# Patient Record
Sex: Female | Born: 1950 | Race: White | Hispanic: No | Marital: Married | Smoking: Never smoker
Health system: Southern US, Community
[De-identification: ages and names within clinical notes are randomized; demographics above are authoritative.]

## PROBLEM LIST (undated history)

## (undated) DIAGNOSIS — J3081 Allergic rhinitis due to animal (cat) (dog) hair and dander: Secondary | ICD-10-CM

## (undated) DIAGNOSIS — IMO0002 Reserved for concepts with insufficient information to code with codable children: Secondary | ICD-10-CM

## (undated) DIAGNOSIS — K219 Gastro-esophageal reflux disease without esophagitis: Secondary | ICD-10-CM

## (undated) DIAGNOSIS — Z9109 Other allergy status, other than to drugs and biological substances: Secondary | ICD-10-CM

## (undated) DIAGNOSIS — T7840XA Allergy, unspecified, initial encounter: Secondary | ICD-10-CM

## (undated) DIAGNOSIS — F329 Major depressive disorder, single episode, unspecified: Secondary | ICD-10-CM

## (undated) DIAGNOSIS — M1990 Unspecified osteoarthritis, unspecified site: Secondary | ICD-10-CM

## (undated) DIAGNOSIS — M359 Systemic involvement of connective tissue, unspecified: Secondary | ICD-10-CM

## (undated) DIAGNOSIS — M797 Fibromyalgia: Secondary | ICD-10-CM

## (undated) DIAGNOSIS — J45909 Unspecified asthma, uncomplicated: Secondary | ICD-10-CM

## (undated) DIAGNOSIS — N814 Uterovaginal prolapse, unspecified: Secondary | ICD-10-CM

## (undated) DIAGNOSIS — E039 Hypothyroidism, unspecified: Secondary | ICD-10-CM

## (undated) DIAGNOSIS — F419 Anxiety disorder, unspecified: Secondary | ICD-10-CM

## (undated) DIAGNOSIS — M810 Age-related osteoporosis without current pathological fracture: Secondary | ICD-10-CM

## (undated) DIAGNOSIS — F32A Depression, unspecified: Secondary | ICD-10-CM

## (undated) HISTORY — DX: Gastro-esophageal reflux disease without esophagitis: K21.9

## (undated) HISTORY — PX: TUBAL LIGATION: SHX77

## (undated) HISTORY — PX: BREAST EXCISIONAL BIOPSY: SUR124

## (undated) HISTORY — PX: CHOLECYSTECTOMY: SHX55

## (undated) HISTORY — PX: SPINE SURGERY: SHX786

## (undated) HISTORY — DX: Uterovaginal prolapse, unspecified: N81.4

## (undated) HISTORY — DX: Systemic involvement of connective tissue, unspecified: M35.9

## (undated) HISTORY — DX: Age-related osteoporosis without current pathological fracture: M81.0

## (undated) HISTORY — PX: BACK SURGERY: SHX140

## (undated) HISTORY — DX: Anxiety disorder, unspecified: F41.9

## (undated) HISTORY — PX: CERVICAL FUSION: SHX112

## (undated) HISTORY — DX: Allergy, unspecified, initial encounter: T78.40XA

## (undated) HISTORY — DX: Depression, unspecified: F32.A

## (undated) HISTORY — DX: Unspecified osteoarthritis, unspecified site: M19.90

## (undated) HISTORY — PX: INJECTION ANESTHETIC AGENT TO CERVICAL PLEXUS: SUR704

## (undated) HISTORY — DX: Fibromyalgia: M79.7

## (undated) HISTORY — PX: ECTOPIC PREGNANCY SURGERY: SHX613

## (undated) HISTORY — DX: Major depressive disorder, single episode, unspecified: F32.9

## (undated) HISTORY — PX: COSMETIC SURGERY: SHX468

## (undated) HISTORY — PX: APPENDECTOMY: SHX54

## (undated) HISTORY — PX: SKIN GRAFT: SHX250

## (undated) HISTORY — DX: Reserved for concepts with insufficient information to code with codable children: IMO0002

## (undated) HISTORY — PX: OTHER SURGICAL HISTORY: SHX169

---

## 2004-03-15 HISTORY — PX: CERVICAL SPINE SURGERY: SHX589

## 2009-12-30 ENCOUNTER — Ambulatory Visit (HOSPITAL_COMMUNITY): Payer: Self-pay | Admitting: Licensed Clinical Social Worker

## 2010-01-13 ENCOUNTER — Ambulatory Visit (HOSPITAL_COMMUNITY): Payer: Self-pay | Admitting: Licensed Clinical Social Worker

## 2010-01-23 ENCOUNTER — Ambulatory Visit (HOSPITAL_COMMUNITY): Payer: Self-pay | Admitting: Licensed Clinical Social Worker

## 2010-02-24 ENCOUNTER — Ambulatory Visit (HOSPITAL_COMMUNITY): Payer: Self-pay | Admitting: Licensed Clinical Social Worker

## 2010-03-10 ENCOUNTER — Ambulatory Visit (HOSPITAL_COMMUNITY): Payer: Self-pay | Admitting: Licensed Clinical Social Worker

## 2010-03-24 ENCOUNTER — Ambulatory Visit (HOSPITAL_COMMUNITY)
Admission: RE | Admit: 2010-03-24 | Discharge: 2010-03-24 | Payer: Self-pay | Source: Home / Self Care | Attending: Licensed Clinical Social Worker | Admitting: Licensed Clinical Social Worker

## 2010-03-31 ENCOUNTER — Ambulatory Visit (HOSPITAL_COMMUNITY)
Admission: RE | Admit: 2010-03-31 | Discharge: 2010-03-31 | Payer: Self-pay | Source: Home / Self Care | Attending: Licensed Clinical Social Worker | Admitting: Licensed Clinical Social Worker

## 2010-04-07 ENCOUNTER — Ambulatory Visit (HOSPITAL_COMMUNITY): Admit: 2010-04-07 | Payer: Self-pay | Admitting: Licensed Clinical Social Worker

## 2010-04-21 ENCOUNTER — Encounter (INDEPENDENT_AMBULATORY_CARE_PROVIDER_SITE_OTHER): Payer: PRIVATE HEALTH INSURANCE | Admitting: Licensed Clinical Social Worker

## 2010-04-21 DIAGNOSIS — F341 Dysthymic disorder: Secondary | ICD-10-CM

## 2010-04-24 ENCOUNTER — Encounter (HOSPITAL_COMMUNITY): Payer: Self-pay | Admitting: Psychiatry

## 2010-04-27 ENCOUNTER — Ambulatory Visit (INDEPENDENT_AMBULATORY_CARE_PROVIDER_SITE_OTHER): Payer: PRIVATE HEALTH INSURANCE | Admitting: Psychiatry

## 2010-04-27 DIAGNOSIS — F339 Major depressive disorder, recurrent, unspecified: Secondary | ICD-10-CM

## 2010-05-05 ENCOUNTER — Encounter (HOSPITAL_COMMUNITY): Payer: Self-pay | Admitting: Licensed Clinical Social Worker

## 2010-05-07 ENCOUNTER — Encounter (INDEPENDENT_AMBULATORY_CARE_PROVIDER_SITE_OTHER): Payer: PRIVATE HEALTH INSURANCE | Admitting: Licensed Clinical Social Worker

## 2010-05-07 DIAGNOSIS — F411 Generalized anxiety disorder: Secondary | ICD-10-CM

## 2010-05-07 DIAGNOSIS — F331 Major depressive disorder, recurrent, moderate: Secondary | ICD-10-CM

## 2010-05-19 ENCOUNTER — Encounter (INDEPENDENT_AMBULATORY_CARE_PROVIDER_SITE_OTHER): Payer: PRIVATE HEALTH INSURANCE | Admitting: Licensed Clinical Social Worker

## 2010-05-19 DIAGNOSIS — F341 Dysthymic disorder: Secondary | ICD-10-CM

## 2010-06-02 ENCOUNTER — Encounter (INDEPENDENT_AMBULATORY_CARE_PROVIDER_SITE_OTHER): Payer: PRIVATE HEALTH INSURANCE | Admitting: Licensed Clinical Social Worker

## 2010-06-02 DIAGNOSIS — F411 Generalized anxiety disorder: Secondary | ICD-10-CM

## 2010-06-04 ENCOUNTER — Encounter: Payer: Self-pay | Admitting: Family Medicine

## 2010-06-04 ENCOUNTER — Ambulatory Visit (INDEPENDENT_AMBULATORY_CARE_PROVIDER_SITE_OTHER): Payer: PRIVATE HEALTH INSURANCE | Admitting: Family Medicine

## 2010-06-04 DIAGNOSIS — M5030 Other cervical disc degeneration, unspecified cervical region: Secondary | ICD-10-CM

## 2010-06-04 DIAGNOSIS — M25569 Pain in unspecified knee: Secondary | ICD-10-CM

## 2010-06-04 DIAGNOSIS — M797 Fibromyalgia: Secondary | ICD-10-CM | POA: Insufficient documentation

## 2010-06-04 DIAGNOSIS — IMO0001 Reserved for inherently not codable concepts without codable children: Secondary | ICD-10-CM

## 2010-06-04 DIAGNOSIS — F32A Depression, unspecified: Secondary | ICD-10-CM | POA: Insufficient documentation

## 2010-06-04 DIAGNOSIS — F329 Major depressive disorder, single episode, unspecified: Secondary | ICD-10-CM

## 2010-06-04 DIAGNOSIS — E559 Vitamin D deficiency, unspecified: Secondary | ICD-10-CM | POA: Insufficient documentation

## 2010-06-04 DIAGNOSIS — E039 Hypothyroidism, unspecified: Secondary | ICD-10-CM | POA: Insufficient documentation

## 2010-06-04 DIAGNOSIS — M47812 Spondylosis without myelopathy or radiculopathy, cervical region: Secondary | ICD-10-CM | POA: Insufficient documentation

## 2010-06-04 MED ORDER — TRAMADOL HCL 50 MG PO TABS: 50.0000 mg | ORAL_TABLET | Freq: Four times a day (QID) | ORAL | Status: DC | PRN

## 2010-06-04 MED ORDER — GABAPENTIN 400 MG PO TABS: 400.0000 mg | ORAL_TABLET | Freq: Four times a day (QID) | ORAL | Status: DC
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
## 2010-06-04 NOTE — Patient Instructions (Addendum)
We will call and see if we can get you in with Dr. Titus Dubin (rheumatology) in [place].   Go in May for your thyroid recheck.

## 2010-06-04 NOTE — Assessment & Plan Note (Signed)
Discussed that she is not happy with her current regimen. She would like ot go back to 4 tabs of neurontin so increased her dose. I did refill her tramadol as well.  Will refer to rheumatology for further evaluation. She already has a scheduled appt with Dr. Ancil Linsey in June. Will try to get her in somewhere sooner.  She has done PT  And this has not really helped.

## 2010-06-04 NOTE — Assessment & Plan Note (Signed)
She is due to recheck her level in 2 months.  Given lab slip for downstairs. She is feeling better on this.  This is a fairly new dx.

## 2010-06-05 ENCOUNTER — Telehealth: Payer: Self-pay | Admitting: Family Medicine

## 2010-06-08 DIAGNOSIS — M25569 Pain in unspecified knee: Secondary | ICD-10-CM | POA: Insufficient documentation

## 2010-06-09 ENCOUNTER — Encounter: Payer: Self-pay | Admitting: Family Medicine

## 2010-06-10 ENCOUNTER — Ambulatory Visit (HOSPITAL_COMMUNITY): Payer: Self-pay | Admitting: Psychiatry

## 2010-06-11 ENCOUNTER — Encounter (INDEPENDENT_AMBULATORY_CARE_PROVIDER_SITE_OTHER): Payer: PRIVATE HEALTH INSURANCE | Admitting: Psychiatry

## 2010-06-11 DIAGNOSIS — F339 Major depressive disorder, recurrent, unspecified: Secondary | ICD-10-CM

## 2010-06-15 ENCOUNTER — Encounter: Payer: Self-pay | Admitting: Family Medicine

## 2010-06-16 ENCOUNTER — Encounter (INDEPENDENT_AMBULATORY_CARE_PROVIDER_SITE_OTHER): Payer: PRIVATE HEALTH INSURANCE | Admitting: Licensed Clinical Social Worker

## 2010-06-16 DIAGNOSIS — F411 Generalized anxiety disorder: Secondary | ICD-10-CM

## 2010-06-22 ENCOUNTER — Telehealth: Payer: Self-pay | Admitting: Family Medicine

## 2010-06-22 NOTE — Telephone Encounter (Signed)
Patient called 2 x today and the first time she complains that she called LAST Thursday 4/5 and left a message for the nurse re: her cough and she has not heard anything back from anyone. She states that she left another message today and is kind of agitated that she can not get a phone call back.... Her call back number is [phone].Marland KitchenMarland KitchenThanks, Michaelle Copas

## 2010-06-23 ENCOUNTER — Ambulatory Visit (INDEPENDENT_AMBULATORY_CARE_PROVIDER_SITE_OTHER): Payer: PRIVATE HEALTH INSURANCE | Admitting: Family Medicine

## 2010-06-23 DIAGNOSIS — J40 Bronchitis, not specified as acute or chronic: Secondary | ICD-10-CM

## 2010-06-23 MED ORDER — AZITHROMYCIN 250 MG PO TABS: ORAL_TABLET | ORAL | Status: AC

## 2010-06-23 MED ORDER — FLUTICASONE FUROATE 27.5 MCG/SPRAY NA SUSP: 2.0000 | Freq: Every day | NASAL | Status: DC

## 2010-06-23 NOTE — Progress Notes (Signed)
  Subjective:    Patient ID: Robin Conrad, female    DOB: 1950-12-11, 60 y.o.   MRN: [id number]  HPI  Cough started about 10 days. Started with a loose cough. Got worse over the week.  Sputum has been yellow. Then nose was getting dry and started seeing some blood.  Did take her allergies shot last week. Voice is hoarse.  Cough has been worse in the AM.  Then used her inhaler again this AM.  SEver HA.  Right ear has pressure.  Has hx of small ear canals.  No fever.  No wheezing. Allergist felt this was not her asthma.   Review of Systems     Objective:   Physical Exam  Constitutional: She is oriented to person, place, and time. She appears well-developed and well-nourished.  HENT:  Head: Normocephalic and atraumatic.  Right Ear: External ear normal.  Nose: Nose normal.  Mouth/Throat: Oropharynx is clear and moist.       Left canal blocked by cerumen. Nasal turbinates are swollen.   Eyes: Conjunctivae and EOM are normal. Pupils are equal, round, and reactive to light.  Neck: No thyromegaly present.  Cardiovascular: Normal rate, regular rhythm and normal heart sounds.   Pulmonary/Chest: Effort normal and breath sounds normal.  Musculoskeletal: Normal range of motion.  Lymphadenopathy:    She has no cervical adenopathy.  Neurological: She is alert and oriented to person, place, and time.  Skin: Skin is warm and dry.          Assessment & Plan:  Acute bronchitis - symptomatic care. Discussed trial of ABX since not better. Encouraged her to wait a couple of more day to fill it. She is also having some nasal allergy sxs so will start a nasal steroid spray until her f/u with the allergies in May. She feels her allergies are worse since moving back to [place].  She has been using her inhaled steroid daily and has not sign of SOB or wheezing on exam today.  Call if not better in 7-10 days.

## 2010-06-23 NOTE — Telephone Encounter (Signed)
Pt was called yesterday around 5 pm and worked into todays schdule

## 2010-06-25 ENCOUNTER — Ambulatory Visit: Payer: PRIVATE HEALTH INSURANCE | Admitting: Family Medicine

## 2010-06-30 ENCOUNTER — Encounter (HOSPITAL_COMMUNITY): Payer: PRIVATE HEALTH INSURANCE | Admitting: Licensed Clinical Social Worker

## 2010-07-02 ENCOUNTER — Encounter (INDEPENDENT_AMBULATORY_CARE_PROVIDER_SITE_OTHER): Payer: PRIVATE HEALTH INSURANCE | Admitting: Licensed Clinical Social Worker

## 2010-07-02 DIAGNOSIS — F411 Generalized anxiety disorder: Secondary | ICD-10-CM

## 2010-07-07 ENCOUNTER — Ambulatory Visit (INDEPENDENT_AMBULATORY_CARE_PROVIDER_SITE_OTHER): Payer: PRIVATE HEALTH INSURANCE | Admitting: Family Medicine

## 2010-07-07 VITALS — BP 122/64 | HR 103 | Ht 66.5 in | Wt 123.0 lb

## 2010-07-07 DIAGNOSIS — M792 Neuralgia and neuritis, unspecified: Secondary | ICD-10-CM | POA: Insufficient documentation

## 2010-07-07 DIAGNOSIS — IMO0002 Reserved for concepts with insufficient information to code with codable children: Secondary | ICD-10-CM

## 2010-07-07 DIAGNOSIS — Z1231 Encounter for screening mammogram for malignant neoplasm of breast: Secondary | ICD-10-CM

## 2010-07-07 DIAGNOSIS — E039 Hypothyroidism, unspecified: Secondary | ICD-10-CM

## 2010-07-07 MED ORDER — LACTULOSE 10 GM/15ML PO SOLN: 10.0000 g | Freq: Every day | ORAL | Status: AC

## 2010-07-07 NOTE — Progress Notes (Signed)
  Subjective:    Patient ID: Robin Conrad, female    DOB: 1950-05-30, 60 y.o.   MRN: [id number]  HPI Dr. Christell Constant upped her citalopram to 20mg  and she feels much better on this.  Also using her benzo once a day. She feels this regimen has been working well for her. She is still seeing a counselor downstairs.  Neuropathic pain  - Feels the neurotin 4 x a dya works better. It is a little constipating.  Colace doesn't really work. Has been using lactulose.   she would like refill on this medications.   She hasn't gone for her labs for thyroid and vitamin.     Review of Systems     Objective:   Physical Exam  Constitutional: She appears well-developed and well-nourished.  HENT:  Head: Normocephalic and atraumatic.  Eyes: Conjunctivae are normal. Pupils are equal, round, and reactive to light.  Neck: Neck supple. No thyromegaly present.  Cardiovascular: Normal rate, regular rhythm and normal heart sounds.   Pulmonary/Chest: Effort normal and breath sounds normal.  Lymphadenopathy:    She has no cervical adenopathy.          Assessment & Plan:  Doing well with he rneuropathic pain.    She is also due for her mammogram so will schedule it for her.

## 2010-07-07 NOTE — Assessment & Plan Note (Signed)
Recheck TSH this month.

## 2010-07-07 NOTE — Assessment & Plan Note (Signed)
She is doing well Neurontin.

## 2010-07-13 ENCOUNTER — Encounter: Payer: Self-pay | Admitting: Family Medicine

## 2010-07-14 ENCOUNTER — Ambulatory Visit
Admission: RE | Admit: 2010-07-14 | Discharge: 2010-07-14 | Disposition: A | Discharge status: Home / Self Care | Payer: PRIVATE HEALTH INSURANCE | Source: Ambulatory Visit | Attending: Family Medicine | Admitting: Family Medicine

## 2010-07-17 ENCOUNTER — Other Ambulatory Visit: Payer: Self-pay | Admitting: Family Medicine

## 2010-07-17 DIAGNOSIS — R928 Other abnormal and inconclusive findings on diagnostic imaging of breast: Secondary | ICD-10-CM

## 2010-07-21 ENCOUNTER — Encounter (INDEPENDENT_AMBULATORY_CARE_PROVIDER_SITE_OTHER): Payer: PRIVATE HEALTH INSURANCE | Admitting: Licensed Clinical Social Worker

## 2010-07-21 DIAGNOSIS — F411 Generalized anxiety disorder: Secondary | ICD-10-CM

## 2010-07-22 ENCOUNTER — Ambulatory Visit
Admission: RE | Admit: 2010-07-22 | Discharge: 2010-07-22 | Disposition: A | Discharge status: Home / Self Care | Payer: PRIVATE HEALTH INSURANCE | Source: Ambulatory Visit | Attending: Family Medicine | Admitting: Family Medicine

## 2010-07-22 ENCOUNTER — Telehealth: Payer: Self-pay | Admitting: Family Medicine

## 2010-07-22 DIAGNOSIS — R928 Other abnormal and inconclusive findings on diagnostic imaging of breast: Secondary | ICD-10-CM

## 2010-07-22 NOTE — Telephone Encounter (Signed)
Left message on pt.'s vm.

## 2010-07-22 NOTE — Telephone Encounter (Signed)
Call pt: Mammogram was OK. Repeat in one year.

## 2010-07-23 ENCOUNTER — Other Ambulatory Visit: Payer: PRIVATE HEALTH INSURANCE | Admitting: Family Medicine

## 2010-07-23 ENCOUNTER — Telehealth: Payer: Self-pay | Admitting: *Deleted

## 2010-07-23 DIAGNOSIS — E039 Hypothyroidism, unspecified: Secondary | ICD-10-CM

## 2010-07-23 NOTE — Telephone Encounter (Signed)
ordered

## 2010-07-24 LAB — TSH: TSH: 0.763 u[IU]/mL (ref 0.350–4.500)

## 2010-08-04 ENCOUNTER — Encounter (INDEPENDENT_AMBULATORY_CARE_PROVIDER_SITE_OTHER): Payer: PRIVATE HEALTH INSURANCE | Admitting: Licensed Clinical Social Worker

## 2010-08-04 DIAGNOSIS — F411 Generalized anxiety disorder: Secondary | ICD-10-CM

## 2010-08-05 ENCOUNTER — Encounter: Payer: Self-pay | Admitting: Family Medicine

## 2010-08-06 ENCOUNTER — Telehealth: Payer: Self-pay | Admitting: Family Medicine

## 2010-08-06 NOTE — Telephone Encounter (Signed)
Message copied by Seymour Bars on Thu Aug 06, 2010  8:20 AM ------      Message from: Payton Spark      Created: Wed Aug 05, 2010 11:46 AM                   ----- Message -----         From: Lab In [place] Interface         Sent: 07/24/2010   2:28 AM           To: Payton Spark, CMA

## 2010-08-07 ENCOUNTER — Other Ambulatory Visit: Payer: Self-pay | Admitting: Family Medicine

## 2010-08-07 NOTE — Telephone Encounter (Signed)
Message copied by Glennis Brink on Fri Aug 07, 2010  2:30 PM ------      Message from: MILLS, MICHELLE P      Created: Wed Aug 05, 2010 11:46 AM                   ----- Message -----         From: Lab In [place] Interface         Sent: 07/24/2010   2:28 AM           To: Payton Spark, CMA

## 2010-08-07 NOTE — Telephone Encounter (Signed)
Called patient: Thyroid and vitamin D looked great.

## 2010-08-07 NOTE — Telephone Encounter (Signed)
Pt.notified

## 2010-08-11 ENCOUNTER — Encounter (INDEPENDENT_AMBULATORY_CARE_PROVIDER_SITE_OTHER): Payer: PRIVATE HEALTH INSURANCE | Admitting: Psychiatry

## 2010-08-11 DIAGNOSIS — F339 Major depressive disorder, recurrent, unspecified: Secondary | ICD-10-CM

## 2010-08-11 DIAGNOSIS — F411 Generalized anxiety disorder: Secondary | ICD-10-CM

## 2010-08-18 ENCOUNTER — Encounter (INDEPENDENT_AMBULATORY_CARE_PROVIDER_SITE_OTHER): Payer: PRIVATE HEALTH INSURANCE | Admitting: Licensed Clinical Social Worker

## 2010-08-18 DIAGNOSIS — F411 Generalized anxiety disorder: Secondary | ICD-10-CM

## 2010-08-20 ENCOUNTER — Other Ambulatory Visit: Payer: Self-pay | Admitting: Family Medicine

## 2010-08-24 ENCOUNTER — Telehealth: Payer: Self-pay | Admitting: Family Medicine

## 2010-08-24 NOTE — Telephone Encounter (Signed)
Pt called and needs to know if she needs anything from our office for her referral appt with Dr. Ancil Linsey tomorrow.  Seeing him for his fibromyalgia.  Said Dr. Linford Arnold didn't feel comfortable treating or following and she preferred her to go ahead and see the specialist for this problem. Told pt to let their office go on  the noted that her previous PCP Dr. Dierdre Searles had sent.  Pt was scheduled a 3 mth followup as well for out office while she was on the phone. Jarvis Newcomer, LPN [facility]

## 2010-09-01 ENCOUNTER — Encounter (INDEPENDENT_AMBULATORY_CARE_PROVIDER_SITE_OTHER): Payer: PRIVATE HEALTH INSURANCE | Admitting: Licensed Clinical Social Worker

## 2010-09-01 DIAGNOSIS — F411 Generalized anxiety disorder: Secondary | ICD-10-CM

## 2010-09-03 MED ORDER — TRAMADOL HCL 50 MG PO TABS: 50.0000 mg | ORAL_TABLET | Freq: Every day | ORAL | Status: DC | PRN

## 2010-09-16 ENCOUNTER — Other Ambulatory Visit: Payer: Self-pay | Admitting: Family Medicine

## 2010-09-17 ENCOUNTER — Encounter (INDEPENDENT_AMBULATORY_CARE_PROVIDER_SITE_OTHER): Payer: PRIVATE HEALTH INSURANCE | Admitting: Licensed Clinical Social Worker

## 2010-09-17 DIAGNOSIS — F411 Generalized anxiety disorder: Secondary | ICD-10-CM

## 2010-10-01 ENCOUNTER — Encounter (INDEPENDENT_AMBULATORY_CARE_PROVIDER_SITE_OTHER): Payer: PRIVATE HEALTH INSURANCE | Admitting: Licensed Clinical Social Worker

## 2010-10-01 DIAGNOSIS — F410 Panic disorder [episodic paroxysmal anxiety] without agoraphobia: Secondary | ICD-10-CM

## 2010-10-06 ENCOUNTER — Ambulatory Visit: Payer: PRIVATE HEALTH INSURANCE | Admitting: Family Medicine

## 2010-10-08 ENCOUNTER — Encounter: Payer: Self-pay | Admitting: Family Medicine

## 2010-10-08 ENCOUNTER — Ambulatory Visit (INDEPENDENT_AMBULATORY_CARE_PROVIDER_SITE_OTHER): Payer: PRIVATE HEALTH INSURANCE | Admitting: Family Medicine

## 2010-10-08 VITALS — BP 110/63 | HR 88 | Ht 65.0 in | Wt 126.0 lb

## 2010-10-08 DIAGNOSIS — IMO0001 Reserved for inherently not codable concepts without codable children: Secondary | ICD-10-CM

## 2010-10-08 DIAGNOSIS — E559 Vitamin D deficiency, unspecified: Secondary | ICD-10-CM

## 2010-10-08 DIAGNOSIS — F411 Generalized anxiety disorder: Secondary | ICD-10-CM

## 2010-10-08 DIAGNOSIS — E039 Hypothyroidism, unspecified: Secondary | ICD-10-CM

## 2010-10-08 DIAGNOSIS — F419 Anxiety disorder, unspecified: Secondary | ICD-10-CM

## 2010-10-08 DIAGNOSIS — M797 Fibromyalgia: Secondary | ICD-10-CM

## 2010-10-08 NOTE — Progress Notes (Signed)
  Subjective:    Patient ID: Robin Conrad, female    DOB: 1950/11/30, 60 y.o.   MRN: [id number]  HPI Says the citalopram  Has caused some mild weight gain and constipation. She takes lactulose and even that doesn't help.  Now using a vegetable laxitive.  Says has mentioned this to Dr. Christell Constant who is the prescriber (psych) before.  She walks regularly.  O/w she does feel it works well for her mood and anxiety.  She is using her xanax regularly  Vitamin D def- Last level was normal. Wants to know what to take fore maintenance.   Fibro - Happy with her curent regimen. Getting some exercise.    Review of Systems     Objective:   Physical Exam  Constitutional: She is oriented to person, place, and time. She appears well-developed and well-nourished.  HENT:  Head: Normocephalic and atraumatic.  Cardiovascular: Normal rate, regular rhythm and normal heart sounds.   Pulmonary/Chest: Effort normal and breath sounds normal.  Neurological: She is alert and oriented to person, place, and time.  Skin: Skin is warm and dry.  Psychiatric: She has a normal mood and affect.          Assessment & Plan:  Anxiety - Discussed really needs to let Dr Christell Constant know she rally wants to change her med. Even consider changing to Lexapro to see if fewer s.e.   Vitamin D def - Now resolved. Start 1000iu daily. She can also take this as part of her calcium supplement as well.   Fibromyalgis- Is stable.  No changes today.

## 2010-10-08 NOTE — Assessment & Plan Note (Signed)
Due to recheck levels 

## 2010-10-08 NOTE — Patient Instructions (Addendum)
Vitamin D1000 IU once a day.  Consider changing to Lexapro.

## 2010-10-14 ENCOUNTER — Other Ambulatory Visit: Payer: Self-pay | Admitting: Family Medicine

## 2010-10-15 ENCOUNTER — Encounter (HOSPITAL_COMMUNITY): Payer: PRIVATE HEALTH INSURANCE | Admitting: Licensed Clinical Social Worker

## 2010-10-16 ENCOUNTER — Encounter (INDEPENDENT_AMBULATORY_CARE_PROVIDER_SITE_OTHER): Payer: PRIVATE HEALTH INSURANCE | Admitting: Licensed Clinical Social Worker

## 2010-10-16 DIAGNOSIS — F411 Generalized anxiety disorder: Secondary | ICD-10-CM

## 2010-11-03 ENCOUNTER — Encounter (INDEPENDENT_AMBULATORY_CARE_PROVIDER_SITE_OTHER): Payer: PRIVATE HEALTH INSURANCE | Admitting: Licensed Clinical Social Worker

## 2010-11-03 DIAGNOSIS — F411 Generalized anxiety disorder: Secondary | ICD-10-CM

## 2010-11-03 DIAGNOSIS — F331 Major depressive disorder, recurrent, moderate: Secondary | ICD-10-CM

## 2010-11-12 ENCOUNTER — Encounter (INDEPENDENT_AMBULATORY_CARE_PROVIDER_SITE_OTHER): Payer: PRIVATE HEALTH INSURANCE | Admitting: Psychiatry

## 2010-11-12 DIAGNOSIS — F339 Major depressive disorder, recurrent, unspecified: Secondary | ICD-10-CM

## 2010-11-12 DIAGNOSIS — F411 Generalized anxiety disorder: Secondary | ICD-10-CM

## 2010-11-13 ENCOUNTER — Telehealth: Payer: Self-pay | Admitting: Family Medicine

## 2010-11-13 ENCOUNTER — Other Ambulatory Visit: Payer: Self-pay | Admitting: *Deleted

## 2010-11-13 ENCOUNTER — Telehealth: Payer: Self-pay | Admitting: *Deleted

## 2010-11-13 MED ORDER — TRAMADOL HCL 50 MG PO TABS: 50.0000 mg | ORAL_TABLET | Freq: Every day | ORAL | Status: DC | PRN

## 2010-11-13 MED ORDER — LEVOTHYROXINE SODIUM 50 MCG PO TABS: 50.0000 ug | ORAL_TABLET | Freq: Every day | ORAL | Status: DC

## 2010-11-13 NOTE — Telephone Encounter (Signed)
When I look at previous fills we only fill it for 30 tabs a month so it really doesn't matter if the directions say one or twice a day.  If she is asking for an increse in her dosing then that is different.

## 2010-11-13 NOTE — Telephone Encounter (Signed)
Pt.notified

## 2010-11-13 NOTE — Telephone Encounter (Signed)
Call ZO:[id number] looks great!

## 2010-11-13 NOTE — Telephone Encounter (Signed)
Pt calls and states that the tramadol she takes for fibromyalgia when got filled last time the pharmacist noticed the directions were changed from 1 tab twice a day to 1 tab daily. Pt not sure why and says she can not go all day with just taking 1 tab. Wants to know if you can send a new rx with directions of 2 a day to new pharmacy- [facility] in [facility]

## 2010-11-17 ENCOUNTER — Encounter (INDEPENDENT_AMBULATORY_CARE_PROVIDER_SITE_OTHER): Payer: PRIVATE HEALTH INSURANCE | Admitting: Licensed Clinical Social Worker

## 2010-11-17 DIAGNOSIS — F410 Panic disorder [episodic paroxysmal anxiety] without agoraphobia: Secondary | ICD-10-CM

## 2010-11-19 ENCOUNTER — Telehealth: Payer: Self-pay | Admitting: Family Medicine

## 2010-11-19 NOTE — Telephone Encounter (Signed)
Pt seen back in July 2012, and a script sent for tramadol for 50 mg PO daily, but pt says her script has always been 50mg  QID PRN. Pt called [place] because we have been prescribing medication.  Please advise if we can fix this script.  Pt has been on QID regimen for years.  Pt uses [facility]. Plan:  Routed to Dr. Linford Arnold for review.

## 2010-11-19 NOTE — Telephone Encounter (Signed)
We have filled for 30 tabs a month since March so I am really not sure what her concern is.  Id really doesn't matter if says once a day for 4 times a day she still only get 30 a month. If she feels she needs an increase in her pain regimen then she needs to make an appt.

## 2010-11-23 NOTE — Telephone Encounter (Signed)
Pt notified that will only fill for # 30 for 30 day supply.  Pt has appt on 11-25-10, and will discuss increase of qty at that appt. Jarvis Newcomer, LPN [facility]

## 2010-11-25 ENCOUNTER — Encounter: Payer: Self-pay | Admitting: Family Medicine

## 2010-11-25 ENCOUNTER — Ambulatory Visit (INDEPENDENT_AMBULATORY_CARE_PROVIDER_SITE_OTHER): Payer: PRIVATE HEALTH INSURANCE | Admitting: Family Medicine

## 2010-11-25 VITALS — BP 125/71 | HR 94 | Wt 127.0 lb

## 2010-11-25 DIAGNOSIS — IMO0001 Reserved for inherently not codable concepts without codable children: Secondary | ICD-10-CM

## 2010-11-25 DIAGNOSIS — R1310 Dysphagia, unspecified: Secondary | ICD-10-CM

## 2010-11-25 DIAGNOSIS — Z23 Encounter for immunization: Secondary | ICD-10-CM

## 2010-11-25 DIAGNOSIS — K5909 Other constipation: Secondary | ICD-10-CM | POA: Insufficient documentation

## 2010-11-25 DIAGNOSIS — K5900 Constipation, unspecified: Secondary | ICD-10-CM

## 2010-11-25 DIAGNOSIS — M797 Fibromyalgia: Secondary | ICD-10-CM

## 2010-11-25 MED ORDER — TRAMADOL HCL 50 MG PO TABS: 50.0000 mg | ORAL_TABLET | Freq: Three times a day (TID) | ORAL | Status: DC | PRN

## 2010-11-25 NOTE — Patient Instructions (Signed)
Can take Tylenol arthritis 2-3 times a day.

## 2010-11-25 NOTE — Progress Notes (Signed)
Subjective:    Patient ID: Robin Conrad, female    DOB: [birth year], 60 y.o.   MRN: [id number]  HPI Fibro - She says sometimes will take more than one tramadol in one days. Has seen rheumatologist who recommend no change to her meds excpet ot add tylenol to her tramadol.  She hasn't tried this because she has been fearful about what she hears on the news.  Says her pain is wrose in the AM.  Has been having back and hip pain.  Moved her tramadol at bedtime. She is gettig ready to travel to [place] so wouldl ike ot have extra medications.    She has also noticed a soreness on the left side of her throat when she swallows for the last couple days. She denies eating any popcorn products or actual difficulty in swallowing. No recent URI symptoms or fever. She wonders if it could be the plate in her neck bothering her. She admits she has had more reflux symptoms lately because her constipation has actually pulled it worse. She's been taking an over-the-counter stimulant in addition to Colace once at night and it does seem to help. She typically only has one bowel movement per week. She feels bloated and distended. She has tried MiraLax in the past and says it makes her more bloated. She's also tried probiotics and says that this worked initially but then quit working.  Review of Systems     Objective:   Physical Exam  Constitutional: She is oriented to person, place, and time. She appears well-developed and well-nourished.  HENT:  Head: Normocephalic and atraumatic.  Mouth/Throat: Oropharynx is clear and moist.  Eyes: Conjunctivae are normal. Pupils are equal, round, and reactive to light.  Neck: Neck supple. No thyromegaly present.  Cardiovascular: Normal rate, regular rhythm and normal heart sounds.   Pulmonary/Chest: Effort normal and breath sounds normal.  Lymphadenopathy:    She has no cervical adenopathy.  Neurological: She is alert and oriented to person, place, and time.  Skin: Skin is  warm and dry.  Psychiatric: She has a normal mood and affect. Her behavior is normal.          Assessment & Plan:  Fibromyalgia-old increase her tramadol to twice a day. She can certainly use it as needed. #60 tabs given with 2 refills. I also reviewed with her that she can use Tylenol safely. The most important thing is to recognize that it is and other over-the-counter combination products and to avoid mixing anything that says acetaminophen or Tylenol if she is already taking Tylenol. It does in fact improve pain to combine it with her tramadol.  Back pain-she is still having difficulty with this. She feels she benefits from meds from massage therapy. I asked her to check with her insurance as we could certainly look into this as part of physical therapy regimen.  Painful swallowing-asked patient to just keep an eye on it for the next week or 2 and see if it resolves on its own. Her exam is normal her neck exam is normal. This is reassuring and she's not had any for URI symptoms. If she notes any difficulty actually passing foods or drink and she selected and I can refer her to gastroenterology for further evaluation. Certainly this could be related to her increasing reflux symptoms recently. I do agree that this can get worse if she is more constipated. I discussed avoiding stimulants and tried taking Colace at one with each meal, 3 times a day,  for the next couple weeks to see if this helps with her regularity. If not we could certainly consider, aMitiza, since she has not tolerated MiraLax in the past.  Constipation-see above.

## 2010-11-27 ENCOUNTER — Other Ambulatory Visit: Payer: Self-pay | Admitting: Family Medicine

## 2010-11-27 MED ORDER — CYCLOBENZAPRINE HCL 10 MG PO TABS: 10.0000 mg | ORAL_TABLET | Freq: Three times a day (TID) | ORAL | Status: DC | PRN

## 2010-11-27 NOTE — Telephone Encounter (Signed)
Pt calling for refill of her flexeril.  Says a refill was suppose to have been sent yesterday, but it does not look like it was sent, so sent today for #90/0 refills. Jarvis Newcomer, LPN [facility]

## 2010-12-11 ENCOUNTER — Other Ambulatory Visit: Payer: Self-pay | Admitting: Family Medicine

## 2010-12-11 NOTE — Telephone Encounter (Signed)
Pt requesting refill alvesco inhaler. Plan:  Reviewed the pt chart and refills are still good on alvesco medication  Pharm notified.  6.1 g inhaler is only good for 15 days and they want to dispense 2 inhalers at a time with the 7 refills remaining.  Authorized. Jarvis Newcomer, LPN [facility]

## 2010-12-17 ENCOUNTER — Encounter (HOSPITAL_COMMUNITY): Payer: PRIVATE HEALTH INSURANCE | Admitting: Licensed Clinical Social Worker

## 2010-12-23 ENCOUNTER — Encounter (INDEPENDENT_AMBULATORY_CARE_PROVIDER_SITE_OTHER): Payer: PRIVATE HEALTH INSURANCE | Admitting: Licensed Clinical Social Worker

## 2010-12-23 DIAGNOSIS — F411 Generalized anxiety disorder: Secondary | ICD-10-CM

## 2011-01-06 ENCOUNTER — Encounter (INDEPENDENT_AMBULATORY_CARE_PROVIDER_SITE_OTHER): Payer: PRIVATE HEALTH INSURANCE | Admitting: Licensed Clinical Social Worker

## 2011-01-06 DIAGNOSIS — F411 Generalized anxiety disorder: Secondary | ICD-10-CM

## 2011-01-08 ENCOUNTER — Telehealth: Payer: Self-pay | Admitting: Family Medicine

## 2011-01-08 NOTE — Telephone Encounter (Signed)
Pt called and said she had been sick all week but for the last 3 days had laryngitis, and a bad cough.  Needs instructions on what to do. Plan:  Pt is asthmatic, and is using inhalers, but pt is hurting in her chest and red and hot in face last night which could indicate fever and these symptoms have been going on for a week and pt instucted to go to UC today to be seen.  Laryngitis protocol given. Jarvis Newcomer, LPN [facility]

## 2011-01-11 ENCOUNTER — Inpatient Hospital Stay (INDEPENDENT_AMBULATORY_CARE_PROVIDER_SITE_OTHER)
Admission: RE | Admit: 2011-01-11 | Discharge: 2011-01-11 | Disposition: A | Discharge status: Home / Self Care | Payer: PRIVATE HEALTH INSURANCE | Source: Ambulatory Visit | Attending: Emergency Medicine | Admitting: Emergency Medicine

## 2011-01-11 ENCOUNTER — Encounter: Payer: Self-pay | Admitting: Emergency Medicine

## 2011-01-11 ENCOUNTER — Telehealth: Payer: Self-pay | Admitting: Family Medicine

## 2011-01-11 DIAGNOSIS — J209 Acute bronchitis, unspecified: Secondary | ICD-10-CM

## 2011-01-11 DIAGNOSIS — E039 Hypothyroidism, unspecified: Secondary | ICD-10-CM | POA: Insufficient documentation

## 2011-01-11 DIAGNOSIS — J45909 Unspecified asthma, uncomplicated: Secondary | ICD-10-CM | POA: Insufficient documentation

## 2011-01-11 DIAGNOSIS — F411 Generalized anxiety disorder: Secondary | ICD-10-CM | POA: Insufficient documentation

## 2011-01-11 NOTE — Telephone Encounter (Signed)
Pt called and said she didn't end up going to the UC last week, but yesterday she had sharp pain in her RT chest, still coughing, and was chilled and couldn't get warm last night. Plan:  Since no appts today, once again, pt was instructed to go to the UC.  Told pt she probably will need a CXR.    Jarvis Newcomer, LPN [facility]

## 2011-01-14 ENCOUNTER — Other Ambulatory Visit: Payer: Self-pay | Admitting: Family Medicine

## 2011-01-27 ENCOUNTER — Ambulatory Visit (INDEPENDENT_AMBULATORY_CARE_PROVIDER_SITE_OTHER): Payer: PRIVATE HEALTH INSURANCE | Admitting: Licensed Clinical Social Worker

## 2011-01-27 ENCOUNTER — Encounter (HOSPITAL_COMMUNITY): Payer: Self-pay | Admitting: Licensed Clinical Social Worker

## 2011-01-27 DIAGNOSIS — F419 Anxiety disorder, unspecified: Secondary | ICD-10-CM

## 2011-01-27 DIAGNOSIS — F331 Major depressive disorder, recurrent, moderate: Secondary | ICD-10-CM

## 2011-01-27 DIAGNOSIS — F411 Generalized anxiety disorder: Secondary | ICD-10-CM
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
## 2011-01-27 NOTE — Patient Instructions (Signed)
Plan to help friend with her hoarding problem with professional help Continue to do more of what she feels like doing Keep Thanksgiving mor relaxed

## 2011-02-11 ENCOUNTER — Encounter (HOSPITAL_COMMUNITY): Payer: Self-pay | Admitting: Psychiatry

## 2011-02-12 ENCOUNTER — Other Ambulatory Visit (HOSPITAL_COMMUNITY): Payer: Self-pay | Admitting: Psychiatry

## 2011-02-12 ENCOUNTER — Ambulatory Visit (INDEPENDENT_AMBULATORY_CARE_PROVIDER_SITE_OTHER): Payer: PRIVATE HEALTH INSURANCE | Admitting: Psychiatry

## 2011-02-12 ENCOUNTER — Encounter (HOSPITAL_COMMUNITY): Payer: Self-pay | Admitting: Psychiatry

## 2011-02-12 VITALS — BP 110/62 | Ht 66.0 in | Wt 133.0 lb

## 2011-02-12 DIAGNOSIS — F411 Generalized anxiety disorder: Secondary | ICD-10-CM

## 2011-02-12 DIAGNOSIS — F329 Major depressive disorder, single episode, unspecified: Secondary | ICD-10-CM

## 2011-02-12 MED ORDER — CITALOPRAM HYDROBROMIDE 20 MG PO TABS: 20.0000 mg | ORAL_TABLET | Freq: Every day | ORAL | Status: DC

## 2011-02-12 MED ORDER — ALPRAZOLAM 0.25 MG PO TABS: 0.2500 mg | ORAL_TABLET | Freq: Two times a day (BID) | ORAL | Status: DC

## 2011-02-12 MED ORDER — BUPROPION HCL ER (XL) 150 MG PO TB24: 450.0000 mg | ORAL_TABLET | Freq: Every day | ORAL | Status: DC

## 2011-02-12 NOTE — Progress Notes (Signed)
   [facility] Follow-up Outpatient Visit  [facility] Dec 25, 1950   Subjective: The patient is a 60 year old female who was been followed by [facility] since October of 2011. She is currently diagnosed with generalized anxiety disorder and Maj. depressive disorder. She is doing quite well. She has been compliant with her Xanax and her Celexa. She has gained 5 pounds since last visit. She attributes this to constipation. She feels that several of her medications constipated her including the Celexa. She says her constipation has worsened since Celexa has started. She is asking if we need to go down on it. She is taking the Xanax standing. I believe this is making a difference. She continues with the Ambien at bedtime if needed and the Wellbutrin XL. She is trying to watch what she eats. She did try to cut out lunch but was finding herself being very hungry. She says that she increase fiber in her diet and makes her more gassy. She has been undergoing nerve conduction to help with her bladder. She has difficulty with completion of bladder emptying. She feels her anxiety is well controlled, although she still has her moments.  Filed Vitals:   02/12/11 1008  BP: 110/62    Mental Status Examination  Appearance: Casually dressed Alert: Yes Attention: good  Cooperative: Yes Eye Contact: Good Speech: Regular rate rhythm and volume, nonpressured Psychomotor Activity: Normal Memory/Concentration: Intact Oriented: person, place, time/date and situation Mood: Euthymic Affect: Full Range Thought Processes and Associations: Logical Fund of Knowledge: Fair Thought Content: No suicidal or homicidal thoughts Insight: Fair Judgement: Fair  Diagnosis: Generalized anxiety disorder, major depressive disorder  Treatment Plan: At this point we will not change her medications. She will continue therapy with DL. I will see her back in 3 months.  Jamse Mead, MD

## 2011-02-15 ENCOUNTER — Telehealth: Payer: Self-pay | Admitting: *Deleted

## 2011-02-15 DIAGNOSIS — E039 Hypothyroidism, unspecified: Secondary | ICD-10-CM

## 2011-02-15 NOTE — Progress Notes (Signed)
Summary: cough/laryngitis/sore throat   Vital Signs:  Patient Profile:   60 Years Old Female CC:      cough and hoarseness x 9 days, RT rib area pain x 1 day Weight:      128 pounds O2 Sat:      99 % O2 treatment:    Room Air Temp:     98.4 degrees F oral Pulse rate:   101 / minute Resp:     16 per minute BP sitting:   114 / 69  (left arm) Cuff size:   regular  Vitals Entered By: Clemens Catholic LPN (January 11, 2011 11:28 AM)                  Updated Prior Medication List: BUDEPRION SR 100 MG XR12H-TAB (BUPROPION HCL)  GABAPENTIN 300 MG CAPS (GABAPENTIN)  PROVENTIL HFA 108 (90 BASE) MCG/ACT AERS (ALBUTEROL SULFATE)  CYCLOBENZAPRINE HCL 10 MG TABS (CYCLOBENZAPRINE HCL)  CELEXA 20 MG TABS (CITALOPRAM HYDROBROMIDE)  XANAX 0.5 MG TABS (ALPRAZOLAM)  LEVOTHROID 25 MCG TABS (LEVOTHYROXINE SODIUM)   Current Allergies: No known allergies History of Present Illness Chief Complaint: cough and hoarseness x 9 days, RT rib area pain x 1 day History of Present Illness: 60 Years Old Female complains of onset of cold symptoms for 9 days.  Tiffanie has been using OTC cough meds which is helping a little bit. +sore throat +cough ? R pleuritic pain No wheezing No nasal congestion + post-nasal drainage No sinus pain/pressure + chest congestion No itchy/red eyes No earache No hemoptysis No SOB No chills/sweats No fever No nausea No vomiting No abdominal pain No diarrhea No skin rashes + fatigue +myalgias No headache   REVIEW OF SYSTEMS Constitutional Symptoms       Complains of chills and fatigue.     Denies fever, night sweats, weight loss, and weight gain.  Eyes       Denies change in vision, eye pain, eye discharge, glasses, contact lenses, and eye surgery. Ear/Nose/Throat/Mouth       Complains of sore throat and hoarseness.      Denies hearing loss/aids, change in hearing, ear pain, ear discharge, dizziness, frequent runny nose, frequent nose bleeds, sinus problems,  and tooth pain or bleeding.  Respiratory       Complains of dry cough and asthma.      Denies productive cough, wheezing, shortness of breath, bronchitis, and emphysema/COPD.  Cardiovascular       Complains of murmurs.      Denies chest pain and tires easily with exhertion.    Gastrointestinal       Complains of constipation.      Denies stomach pain, nausea/vomiting, diarrhea, blood in bowel movements, and indigestion. Genitourniary       Denies painful urination, kidney stones, and loss of urinary control. Neurological       Complains of headaches.      Denies paralysis, seizures, and fainting/blackouts. Musculoskeletal       Complains of muscle pain and joint pain.      Denies joint stiffness, decreased range of motion, redness, swelling, muscle weakness, and gout.  Skin       Denies bruising, unusual mles/lumps or sores, and hair/skin or nail changes.  Psych       Complains of anxiety/stress and depression.      Denies mood changes, temper/anger issues, speech problems, and sleep problems. Other Comments: pt c/o cough and hoarseness x 9 days, RT rib area pain x 1  day. she has taken tylenol and tussin DM.   Past History:  Past Medical History: Anxiety Asthma Depression Hypothyroidism fibromyalgia  Past Surgical History: Cholecystectomy 2 neck surgeries  Family History: Family History of Asthma Family History Diabetes 1st degree relative MI  Social History: Never Smoked Alcohol use-no Drug use-no Smoking Status:  never Drug Use:  no Physical Exam General appearance: well developed, well nourished, no acute distress Ears: normal, no lesions or deformities Nasal: mucosa pink, nonedematous, no septal deviation, turbinates normal Oral/Pharynx: tongue normal, posterior pharynx without erythema or exudate Chest/Lungs: no rales, wheezes, or rhonchi bilateral, breath sounds equal without effort Heart: regular rate and  rhythm, no murmur MSE: oriented to time, place, and  person Assessment New Problems: BRONCHITIS, ACUTE (ICD-466.0) FAMILY HISTORY DIABETES 1ST DEGREE RELATIVE (ICD-V18.0) FAMILY HISTORY OF ASTHMA (ICD-V17.5) HYPOTHYROIDISM (ICD-244.9) DEPRESSION (ICD-311) ASTHMA (ICD-493.90) ANXIETY (ICD-300.00) UPPER RESPIRATORY INFECTION, ACUTE (ICD-465.9)   Plan New Medications/Changes: PREDNISONE 20 MG TABS (PREDNISONE) 1 by mouth two times a day for 3 days  #6 x 0, 01/11/2011, Hoyt Koch MD ZITHROMAX Z-PAK 250 MG TABS (AZITHROMYCIN) use as directed  #1 x 0, 01/11/2011, Hoyt Koch MD  New Orders: New Patient Level III [phone] Pulse Oximetry (single measurment) [94760] Planning Comments:   1)  Take the prescribed antibiotic as instructed.  If not improving, pt to return for CXR. 2)  Use nasal saline solution (over the counter) at least 3 times a day. 3)  Use over the counter decongestants like Zyrtec-D every 12 hours as needed to help with congestion. 4)  Can take tylenol every 6 hours or motrin every 8 hours for pain or fever. 5)  Follow up with your primary doctor  if no improvement in 5-7 days, sooner if increasing pain, fever, or new symptoms.    The patient and/or caregiver has been counseled thoroughly with regard to medications prescribed including dosage, schedule, interactions, rationale for use, and possible side effects and they verbalize understanding.  Diagnoses and expected course of recovery discussed and will return if not improved as expected or if the condition worsens. Patient and/or caregiver verbalized understanding.  Prescriptions: PREDNISONE 20 MG TABS (PREDNISONE) 1 by mouth two times a day for 3 days  #6 x 0   Entered and Authorized by:   Hoyt Koch MD   Signed by:   Hoyt Koch MD on 01/11/2011   Method used:   Print then Give to Patient   RxID:   [id number] ZITHROMAX Z-PAK 250 MG TABS (AZITHROMYCIN) use as directed  #1 x 0   Entered and Authorized by:   Hoyt Koch MD   Signed  by:   Hoyt Koch MD on 01/11/2011   Method used:   Print then Give to Patient   RxID:   [id number]   Orders Added: 1)  New Patient Level III [29528] 2)  Pulse Oximetry (single measurment) [41324]

## 2011-02-15 NOTE — Telephone Encounter (Signed)
Lab order enetered

## 2011-02-17 ENCOUNTER — Ambulatory Visit (HOSPITAL_COMMUNITY): Payer: PRIVATE HEALTH INSURANCE | Admitting: Licensed Clinical Social Worker

## 2011-02-23 ENCOUNTER — Ambulatory Visit (HOSPITAL_COMMUNITY): Payer: PRIVATE HEALTH INSURANCE | Admitting: Licensed Clinical Social Worker

## 2011-03-03 ENCOUNTER — Other Ambulatory Visit: Payer: Self-pay | Admitting: Family Medicine

## 2011-03-11 ENCOUNTER — Other Ambulatory Visit: Payer: Self-pay | Admitting: Family Medicine

## 2011-03-15 ENCOUNTER — Other Ambulatory Visit (HOSPITAL_COMMUNITY): Payer: Self-pay | Admitting: Psychiatry

## 2011-03-15 MED ORDER — ZOLPIDEM TARTRATE 10 MG PO TABS: 10.0000 mg | ORAL_TABLET | Freq: Every evening | ORAL | Status: DC | PRN

## 2011-03-17 ENCOUNTER — Encounter (HOSPITAL_COMMUNITY): Payer: Self-pay | Admitting: Licensed Clinical Social Worker

## 2011-03-17 ENCOUNTER — Ambulatory Visit (INDEPENDENT_AMBULATORY_CARE_PROVIDER_SITE_OTHER): Payer: PRIVATE HEALTH INSURANCE | Admitting: Licensed Clinical Social Worker

## 2011-03-17 DIAGNOSIS — F411 Generalized anxiety disorder: Secondary | ICD-10-CM

## 2011-03-17 NOTE — Patient Instructions (Addendum)
Perfectionism causing the stress - learn how to let go - all things to be "good enough" Use methods of relaxation

## 2011-03-17 NOTE — Progress Notes (Signed)
   THERAPIST PROGRESS NOTE  Session Time: 10;13 - 11:05  Participation Level: Active  Behavioral Response: Well GroomedAlertAnxious  Type of Therapy: Individual Therapy  Treatment Goals addressed: Anxiety  Interventions: Assertiveness Training, Supportive and Reframing  Summary: Robin Conrad is a 61 y.o. female who presents with pleasant but anxious mood.  She had a good holiday - was with both daughters and granddaughters.  Saw movie Les Miserables with granddaughter and daughter - it was a great movie - also saw Hobbit.  Oldest daughter has a new make friend - he is into tatoos and piercings.  She knows she has no control over that.  She has new pictures of granddaughter in dance poses.  In Dec it was also her 40th wedding anniversary - she wanted to scrap book something for her husband but she did not have time to do that and she was able to let it go.  She was doing a bird picture for him and she did not finish one but took one day and did another one of a blue bird and she liked it - she was amazed that she did it in only one day.and liked it.  She had an incident in her Allergy Dr. Isidore Moos - she wanted her shot in her hip and nurse kept pushing doing it in her arm - actually arguing with her. She put a complaint out to the office manage but she was not there so one of the doctors talked with her and she commented that she was concerned about whether she had a gun.  She got upset about the whole interaction.  Doctor questioned her flip flop from arm to leg - sometimes it is painful in her arm because of the fibromyalgia.  We discussed her need to be perfect and how that causes a lot of her stress.  She felt a lot better when she let go of doing the scrap book thing.  Discussed how you could do that with anything.  She will work on that..   Suicidal/Homicidal: Nowithout intent/plan  Plan: Return again in 2 weeks.  Diagnosis: Axis I: Anxiety Disorder NOS    Axis II:  Deferred    Robin Conrad,JUDITH A, LCSW 03/17/2011

## 2011-03-31 ENCOUNTER — Ambulatory Visit (INDEPENDENT_AMBULATORY_CARE_PROVIDER_SITE_OTHER): Payer: PRIVATE HEALTH INSURANCE | Admitting: Licensed Clinical Social Worker

## 2011-03-31 DIAGNOSIS — F411 Generalized anxiety disorder: Secondary | ICD-10-CM

## 2011-04-01 ENCOUNTER — Encounter (HOSPITAL_COMMUNITY): Payer: Self-pay | Admitting: Licensed Clinical Social Worker

## 2011-04-01 NOTE — Progress Notes (Signed)
   THERAPIST PROGRESS NOTE  Session Time: 11:05 - !2:00  AM  Participation Level: Active  Behavioral Response: Well GroomedAlertAnxious  Type of Therapy: Individual Therapy  Treatment Goals addressed: Anxiety  Interventions: Solution Focused, Supportive and Reframing  Summary: Klani Caridi is a 61 y.o. female who presents with problems with anxiety. She was in a pleasant mood - some anxiety showed through talking and pressure.  Talked a lot about perfectionism problem in her art work - it is most obvious there but that probably is a problem in other areas of her life.  Some discussion about her sister and her father.  She called her sister and invited her and her husband to dinner with she and her husband - that was suggested by her husband.  They will eat in restaurant and keep things light.  She found her sister to be odd on the phone - she was happy to hear from her.  She sounds like she feels a duty to her family but does not have her heart in it.  They contribute to her anxiety.  Started to clearer again about her goals in therapy.  To start looking at art she does not like and evaluate what the issue in each one is - artistically and personally..   Suicidal/Homicidal: Nowithout intent/plan  Plan: Return again in 2 weeks.  Diagnosis: Axis I: Generalized Anxiety Disorder    Axis II: Deferred    Nelissa Bolduc,JUDITH A, LCSW 04/01/2011

## 2011-04-12 ENCOUNTER — Ambulatory Visit (INDEPENDENT_AMBULATORY_CARE_PROVIDER_SITE_OTHER): Payer: PRIVATE HEALTH INSURANCE | Admitting: Family Medicine

## 2011-04-12 VITALS — BP 117/73 | HR 67 | Wt 129.0 lb

## 2011-04-12 DIAGNOSIS — F411 Generalized anxiety disorder: Secondary | ICD-10-CM

## 2011-04-12 DIAGNOSIS — Z13220 Encounter for screening for lipoid disorders: Secondary | ICD-10-CM

## 2011-04-12 DIAGNOSIS — M797 Fibromyalgia: Secondary | ICD-10-CM

## 2011-04-12 DIAGNOSIS — Z1231 Encounter for screening mammogram for malignant neoplasm of breast: Secondary | ICD-10-CM

## 2011-04-12 DIAGNOSIS — K589 Irritable bowel syndrome without diarrhea: Secondary | ICD-10-CM

## 2011-04-12 DIAGNOSIS — IMO0001 Reserved for inherently not codable concepts without codable children: Secondary | ICD-10-CM

## 2011-04-12 DIAGNOSIS — F419 Anxiety disorder, unspecified: Secondary | ICD-10-CM

## 2011-04-12 NOTE — Progress Notes (Signed)
Subjective:    Patient ID: Robin Conrad, female    DOB: 08/15/1950, 61 y.o.   MRN: [id number]  HPI Tx for bronchitis/sinsusitis with ABX and steroid.  She is feeling better. She said it took a couple weeks to completely get over this.  Fibromyalgia - Feels her fibro is flaring this winter.  Saw Dr. Ancil Linsey in December. He had recommended a topical gel but has several components of the medications she is taking such as Neurontin, baclofen, and an NSAID. They're trying to use this in hopes that they can actually decrease the dose on several of her oral medications. She says her goal is to get off of most of her medications in general. Has been occ using her tramadol tid but mostly one or twice a day.  Did drop her neurontin by 100mg  bc of constipation.  Put on citalopram with Dr. Christell Constant, psychiatry, and started to put on weight.  She says occ skips meals. She has no appetite.  She says she discusses with Dr. Christell Constant and they decided to continue the medication. Her next followup is in March.  IBS - Chronic constipation. Says still using stoolf softerners and using colon cleansers. Has been tring ACAI berry. She reports that this has helped. She really does not want to use a prescription medication for her IBS.    Review of Systems     Objective:   Physical Exam  Constitutional: She is oriented to person, place, and time. She appears well-developed and well-nourished.  HENT:  Head: Normocephalic and atraumatic.  Cardiovascular: Normal rate, regular rhythm and normal heart sounds.   Pulmonary/Chest: Effort normal and breath sounds normal.  Neurological: She is alert and oriented to person, place, and time.  Skin: Skin is warm and dry.  Psychiatric: She has a normal mood and affect. Her behavior is normal.          Assessment & Plan:  Anxiety - Much better controlled. But says teh citalopram, but it is causing weight gain. We discussed that over all this medication is typically weight neutral  though she could be in a small minority that you actually gained weight. But I would suspect that that would be a small amount and should not continue to gain excellent initially as she is on the medication. WE discussed not skpping meals and eating enough calories. I explained that sometimes eating too few calories can actually cause weight gain. She may be eating too less. Also getting her IBS under control may help.  Also I looked back at her weight she is actually 4 pounds less than she was 3 months ago in November. I tried to give her reassurance. It is still bothersome to her and encouraged her to talk with Dr. Christell Constant again at her followup appointment in March.  IBS - Discussed trial of amitiza or newer agents.  She declined at this time. conitinue the stool softeners.  I encouraged her to use a stool softeners on a regular basis to help keep her more regular. She can continue the ACAI berry at the time if she feels it is very helpful. I did warn against laxatives or stimulants which cannot be used chronically.  Fibromyalgia-I think overall she is doing somewhat better that she feels her symptoms have been flaring. She is still using her tramadol for pain. I encouraged her to continue the gel that Dr. Yvetta Coder gave her. I think he can be very helpful and may be able to help her minimize her oral medications.  In addition she still insists on a regular exercise program. She is very interested in this would likely consider physical therapy to get her started. I will check into this for her.  She is also due for a screening lipid level as well as a mammogram. We will place these orders.  30 minutes spent face-to-face discussion about her problems and treatment plan.

## 2011-04-14 ENCOUNTER — Encounter (HOSPITAL_COMMUNITY): Payer: Self-pay | Admitting: Licensed Clinical Social Worker

## 2011-04-14 ENCOUNTER — Ambulatory Visit (INDEPENDENT_AMBULATORY_CARE_PROVIDER_SITE_OTHER): Payer: PRIVATE HEALTH INSURANCE | Admitting: Licensed Clinical Social Worker

## 2011-04-14 DIAGNOSIS — F411 Generalized anxiety disorder: Secondary | ICD-10-CM

## 2011-04-14 DIAGNOSIS — F419 Anxiety disorder, unspecified: Secondary | ICD-10-CM

## 2011-04-14 NOTE — Patient Instructions (Signed)
Continue work with [employer] and work book Decide which class to take - one on colored pencils or Drawing on the Right Side of the Brain

## 2011-04-14 NOTE — Progress Notes (Signed)
   THERAPIST PROGRESS NOTE  Session Time: 11:10 - 12:00 AM  Participation Level: Active  Behavioral Response: NeatAlertAnxious  Type of Therapy: Individual Therapy  Treatment Goals addressed: Anxiety  Interventions: Motivational Interviewing. Reframing, Problem solving  Summary: Robin Conrad is a 61 y.o. 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  Her discussion was focussed on how she is blocked in her art.  Her perfectionism is getting in the way.  She has started working with the book Artist Way and the workbook which is journalling  She is depating about taking another course in colored pencils or learning how to Draw from the Right Side of the Brain..  Suggested that the course on the Right Side of the Brain might be better right now - it is the left side that gets critical and over thinks what she is doing.  She is less likely to get critical by developing the right side or her creative side. Help her see how to look up things on the internet - any problem could be addressed.  One being how to erase colored pencil which is difficult - there were all sorts of ideas from other artists.  She has used self-hypnosis in the past but she has forgotten how to use.  She had her babies naturally with hypnosis and never felt the pain.  Will assist her to find some one to refresh her about self-hypnosis.  Defined problems and goals.    Suicidal/Homicidal: Nowithout intent/plan  Plan: Return again in 2 weeks.  Diagnosis: Axis I: Anxiety Disorder NOS    Axis II: Deferred    Nazaire Cordial,JUDITH A, LCSW 04/14/2011

## 2011-04-28 ENCOUNTER — Ambulatory Visit (INDEPENDENT_AMBULATORY_CARE_PROVIDER_SITE_OTHER): Payer: PRIVATE HEALTH INSURANCE | Admitting: Licensed Clinical Social Worker

## 2011-04-28 DIAGNOSIS — F411 Generalized anxiety disorder: Secondary | ICD-10-CM

## 2011-04-28 DIAGNOSIS — F419 Anxiety disorder, unspecified: Secondary | ICD-10-CM

## 2011-04-30 ENCOUNTER — Other Ambulatory Visit: Payer: Self-pay | Admitting: Family Medicine

## 2011-05-12 ENCOUNTER — Encounter (HOSPITAL_COMMUNITY): Payer: Self-pay | Admitting: Licensed Clinical Social Worker

## 2011-05-12 ENCOUNTER — Ambulatory Visit (INDEPENDENT_AMBULATORY_CARE_PROVIDER_SITE_OTHER): Payer: PRIVATE HEALTH INSURANCE | Admitting: Licensed Clinical Social Worker

## 2011-05-12 DIAGNOSIS — F411 Generalized anxiety disorder: Secondary | ICD-10-CM

## 2011-05-12 DIAGNOSIS — F419 Anxiety disorder, unspecified: Secondary | ICD-10-CM

## 2011-05-12 NOTE — Progress Notes (Signed)
   THERAPIST PROGRESS NOTE  Session Time: 11:05 - 12:00  Participation Level: Active  Behavioral Response: NeatAlertEuthymic  Type of Therapy: Individual Therapy  Treatment Goals addressed: Anxiety  Interventions: Motivational Interviewing, Supportive and Reframing  Summary: Robin Conrad is a 61 y.o. female who presents with anxiety issues.  Today Robin Conrad is in a pleasant mood - she was reporting on what she is gaining from being ian a women's book club - women of all ages - they have become close and are sharing more personal issues in relation to their reading.  She is doing a workshop on making crosses - using all sorts of materials. She is looking forward to that.  She shared a couple of books she is using which are freeing her up in her art interests - Prayer un color where doodles are used and you make artistic thing with them She finds it very freeing and she has done some really good work using this method.  She writes words  from  the scripture.  She also discussed some issues with her father and sister. A couple of months ago she was at a funeral of an aunt and her sister would not make eye contact with her or speak to her - when confronted, she made up some excuse.  Her father sent her a valentine card and then called wondering if she got it. Of course when they took him out to dinner and gave him a BD gift - he neve mentioned either.  She is seeing her father and sister more clearly but what she has concluded that because they are selfish and do not reach out, does not mean that she has to change her behavior.  She is aware that she is more of a family person and she feels a duty to keep in touch and she can do that without expectations.  She has finally become more accepting of her difference - this seems to be showing up in other arenas in her life.   Suicidal/Homicidal: Nowithout intent/plan  Plan: Return again in 2 weeks.  Diagnosis: Axis I: Anxiety Disorder NOS    Axis II:  Deferred    [name],JUDITH A, LCSW 05/12/2011

## 2011-05-17 ENCOUNTER — Ambulatory Visit (INDEPENDENT_AMBULATORY_CARE_PROVIDER_SITE_OTHER): Payer: PRIVATE HEALTH INSURANCE | Admitting: Psychiatry

## 2011-05-17 ENCOUNTER — Encounter (HOSPITAL_COMMUNITY): Payer: Self-pay | Admitting: Psychiatry

## 2011-05-17 VITALS — BP 108/68 | Ht 66.0 in | Wt 129.0 lb

## 2011-05-17 DIAGNOSIS — F411 Generalized anxiety disorder: Secondary | ICD-10-CM

## 2011-05-17 DIAGNOSIS — F329 Major depressive disorder, single episode, unspecified: Secondary | ICD-10-CM

## 2011-05-17 LAB — COMPLETE METABOLIC PANEL WITH GFR
CO2: 29 mEq/L (ref 19–32)
Calcium: 9.8 mg/dL (ref 8.4–10.5)
Chloride: 102 mEq/L (ref 96–112)
Creat: 0.87 mg/dL (ref 0.50–1.10)
GFR, Est African American: 83 mL/min
GFR, Est Non African American: 72 mL/min
Glucose, Bld: 77 mg/dL (ref 70–99)
Total Bilirubin: 0.5 mg/dL (ref 0.3–1.2)

## 2011-05-17 LAB — LIPID PANEL
Cholesterol: 154 mg/dL (ref 0–200)
HDL: 71 mg/dL (ref >39)
Triglycerides: 78 mg/dL (ref <150)

## 2011-05-17 NOTE — Progress Notes (Signed)
   [facility] Follow-up Outpatient Visit  [facility] [birth year]   Subjective: The patient is a 61 year old female who was been followed by [facility] since October of 2011. She is currently diagnosed with generalized anxiety disorder and Maj. depressive disorder. She is doing quite well. She has been compliant with her Xanax and her Celexa. Since last appointment, she is down 4 pounds. She still thinks that constipation is coming from the Celexa. She has tried multiple agents to deal with this. Most recently she has been using [employer] along [employer]. She uses it periodically and it does seem to help. She is becoming more and more at home in the church. 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 The patient has been incorporating a drawing form called Zen tangle which she is using as a coping mechanism to calm down when she needs to. She endorses good sleep most nights. Occasionally she will be up until 2 AM even if she takes the Ambien. Her husband likes to watch TV to fall asleep, which can cause some problems. She endorses good appetite. She continues to workout. She denies any mood symptoms. She feels that she has come a long way.  Filed Vitals:   05/17/11 1027  BP: 108/68    Mental Status Examination  Appearance: Casually dressed Alert: Yes Attention: good  Cooperative: Yes Eye Contact: Good Speech: Regular rate rhythm and volume, nonpressured Psychomotor Activity: Normal Memory/Concentration: Intact Oriented: person, place, time/date and situation Mood: Euthymic Affect: Full Range Thought Processes and Associations: Logical Fund of Knowledge: Fair Thought Content: No suicidal or homicidal thoughts Insight: Fair Judgement: Fair  Diagnosis: Generalized anxiety disorder, major depressive disorder  Treatment Plan: At this  point we will not change her medications.  I will see her back in 3 months.  Jamse Mead, MD

## 2011-05-18 NOTE — Progress Notes (Signed)
Quick Note:  All labs are normal. ______ 

## 2011-05-26 ENCOUNTER — Ambulatory Visit (INDEPENDENT_AMBULATORY_CARE_PROVIDER_SITE_OTHER): Payer: PRIVATE HEALTH INSURANCE | Admitting: Licensed Clinical Social Worker

## 2011-05-26 ENCOUNTER — Other Ambulatory Visit: Payer: Self-pay | Admitting: Family Medicine

## 2011-05-26 DIAGNOSIS — F411 Generalized anxiety disorder: Secondary | ICD-10-CM

## 2011-05-26 DIAGNOSIS — F419 Anxiety disorder, unspecified: Secondary | ICD-10-CM

## 2011-05-27 NOTE — Progress Notes (Signed)
   THERAPIST PROGRESS NOTE  Session Time:  11:00 - 12:00  Participation Level: Active  Behavioral Response: NeatAlertEuthymic  Type of Therapy: Individual Therapy  Treatment Goals addressed: Anxiety  Interventions: Motivational Interviewing, Strength-based and Supportive  Summary: Robin Conrad is a 62 y.o. 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  She talked of her success doing a workshop on making crosses out of objects of nature.  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  She is planning her workshop on prayer with color.  People have been raving about her workshop.  Her husband shared other people's comments.  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  She is very excited about this process.  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 She is happy to do that.  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  Suicidal/Homicidal: Nowithout intent/plan  Plan: Return again in 2 weeks.  Diagnosis: Axis I: Anxiety Disorder NOS    Axis II: Deferred    Bonnie Overdorf,JUDITH A, LCSW 05/27/2011

## 2011-06-02 ENCOUNTER — Other Ambulatory Visit: Payer: Self-pay | Admitting: Family Medicine

## 2011-06-04 NOTE — Progress Notes (Signed)
   THERAPIST PROGRESS NOTE  Session Time: 2:00 - 3:00  Participation Level: Active  Behavioral Response: CasualAlertEuthymic  Type of Therapy: Individual Therapy  Treatment Goals addressed: Anxiety  Interventions: Motivational Interviewing, Solution Focused and Supportive  Summary: Robin Conrad is a 61 y.o. female who presents with problems with anxiety.  Robin Conrad continues to experiment wit her new art modalities  She is enjoying her own explorations and getting ready to share it with others   Her exploring other ways of doing her art she is able to loosen and relax more.  She mainly explored these new ways to be creative and how she will share those with others.  Suicidal/Homicidal: Nowithout intent/plan  Plan: Return again in 2 weeks.  Diagnosis: Axis I: Anxiety Disorder NOS    Axis II: Deferred    Geraldyn Shain,JUDITH A, LCSW 06/04/2011

## 2011-06-07 ENCOUNTER — Ambulatory Visit (INDEPENDENT_AMBULATORY_CARE_PROVIDER_SITE_OTHER): Payer: PRIVATE HEALTH INSURANCE | Admitting: Physician Assistant

## 2011-06-07 ENCOUNTER — Encounter: Payer: Self-pay | Admitting: Physician Assistant

## 2011-06-07 VITALS — BP 91/64 | HR 99 | Ht 66.0 in | Wt 130.0 lb

## 2011-06-07 DIAGNOSIS — R42 Dizziness and giddiness: Secondary | ICD-10-CM

## 2011-06-07 DIAGNOSIS — E039 Hypothyroidism, unspecified: Secondary | ICD-10-CM

## 2011-06-07 NOTE — Progress Notes (Signed)
  Subjective:    Patient ID: Robin Conrad, female    DOB: 03/11/1951, 61 y.o.   MRN: [id number]  HPI Patient presents to the clinic with 2 days of dizziness only in the mornings. She describes the dizziness as feeling "drunk" and not being able to walk straight. Her husband as been worried because he also says that she has been slurring her speech when he talks to her for breakfast. This dizziness only usally last a couple of hours. She has always had low blood pressure and wonders if this could be part of the problem. Patient talks about how she feels like something is not right in her head. She denies an weakness of extremities, facial drooping, or passing out. She does also report a "heaviness" when she wakes up like something clamping down on her head.She does have a history of seasonal allergies but denies any symptoms recently. She takes a daily anti histamine.  She denies any nausea, vomiting, fever, muscle aches, or ear pain.She is on many medications and 6 of which cause dizziness. She has an extensive PMH of pain due to fibromyaglia, DDD, and neuropathic pain. She denies any personal or family history of stroke or neurological issues.     Review of Systems     Objective:   Physical Exam  Constitutional: She is oriented to person, place, and time. She appears well-developed and well-nourished.  HENT:  Head: Normocephalic and atraumatic.  Right Ear: External ear normal.  Left Ear: External ear normal.  Nose: Nose normal.  Mouth/Throat: Oropharynx is clear and moist.       Tm's normal.   Eyes: Conjunctivae and EOM are normal. Pupils are equal, round, and reactive to light.  Neck: Normal range of motion. Neck supple. No thyromegaly present.  Cardiovascular: Normal rate, regular rhythm, normal heart sounds and intact distal pulses.   Pulmonary/Chest: Effort normal and breath sounds normal. She has no wheezes.  Musculoskeletal: Normal range of motion.       Hand grip same bilaterally. Able  to adduct and abduct hands with normal resistance. Strength of bilateral upper extremities 5/5. Normal gait. Able to walk heel to toe. Rapid alternating movement conducted.   Lymphadenopathy:    She has no cervical adenopathy.  Neurological: She is alert and oriented to person, place, and time. She has normal reflexes. No cranial nerve deficit.  Skin: Skin is warm and dry.  Psychiatric: She has a normal mood and affect. Her behavior is normal.          Assessment & Plan:  Dizziness- I wanted to start with decreasing some of her medications. Instructed her to cut in half her flexeril and ambien. Got labs to check electrolyte panel and liver and kidneys. Will call with results. Will follow up in 1 month and see if there are any changes. At that time we can consider a scan of the head.  Reminded patient of stroke symptoms and to go to ER immediately if she experienced any of these symptoms. Continue to treat allergies if dizziness is due to an inner ear problem.  Hypothyroidism- TSH ordered for 3 month f/u. Will call with results.

## 2011-06-07 NOTE — Patient Instructions (Addendum)
Cut flexeril in half before bed along with Ambien. Continue to treat allergies.

## 2011-06-09 ENCOUNTER — Ambulatory Visit (INDEPENDENT_AMBULATORY_CARE_PROVIDER_SITE_OTHER): Payer: PRIVATE HEALTH INSURANCE | Admitting: Licensed Clinical Social Worker

## 2011-06-09 DIAGNOSIS — F419 Anxiety disorder, unspecified: Secondary | ICD-10-CM

## 2011-06-09 DIAGNOSIS — F411 Generalized anxiety disorder: Secondary | ICD-10-CM

## 2011-06-09 LAB — COMPREHENSIVE METABOLIC PANEL
AST: 32 U/L (ref 0–37)
Albumin: 4.3 g/dL (ref 3.5–5.2)
Alkaline Phosphatase: 110 U/L (ref 39–117)
BUN: 18 mg/dL (ref 6–23)
Glucose, Bld: 75 mg/dL (ref 70–99)
Potassium: 4.6 mEq/L (ref 3.5–5.3)
Sodium: 139 mEq/L (ref 135–145)
Total Bilirubin: 0.5 mg/dL (ref 0.3–1.2)

## 2011-06-09 LAB — MAGNESIUM: Magnesium: 1.9 mg/dL (ref 1.5–2.5)

## 2011-06-09 NOTE — Progress Notes (Deleted)
THERAPIST PROGRESS NOTE  Session Time: 11:00 -  12:00  0. . . . . . . . . . . . . . . . . . . . . . . . . . . . . . . . . . . . . . . . . . . . . . . . . . . . . . . . . . . . . . . . . . . . . . . . . . . . . . . . . . . . . . . . . . . . . . . . . . . . . . . . . . . . . . . . . . . . . . . . . . . . . . . . . . . . . . . . . . . . . . . . . . . . . . . . . . . . . . . . . . . . . . . . . . . . . . . . . . . . . . . . . . . . . . . . . . . . . . . . . . . . . . . . . . . . . . . . . . . . . . . . . . . . . . . . . . . . . . . . . . . . . . . . . . . . . . . . . . . . . . . . . . . . . . . . . . . . . . . . . . . . . . . . . . . . . . . . . . . . . . . . . . . . . . . . . . . . . . . . . . . . . . . . . . . . . . . . . . . . . . . . . . . . . . . . . . . . . . . . . . . . .  . . . . . . . . . . . . . . . . . . . . . . . . . . . . . . . . . . . . . . . . . . . . . . . . . . . . . . . . . . . . . . . . . . . . . . . . . . . . . . . . . . . . . . . . . . . . . . . . . . . . . . . . . . . . . . . . . . . . . . . . . . . . . . . . . . . . . . . . . . . . . . . . . . . . . . . . . . . . . . . . . . . . . . . . . . . . . . . . . . . . . . . . . . . . . . . . . . . . . . . . . . . . . . . . . . . . . . . . . . . . . . . . . . . . . . . . . . . . . . . . . . . . . . . . . . . . . . . . . . . . . . . . . . . . . . . . . . . . . . . . . . . . . . . . . . . . . . . . . . . . . . . . . . . . . . . . . . . . . . . . . . . . . . . . . . . . . . . . . . . . . . . . . . . . . . . . . . . . . . . . . . . . . . . . . . . . . . . . . . . . . . . . . . . . . . . . . . . . . . . . . . . . . . . . . . . . . . . . . . . . . . . . . . . . . . . . . . . . . . . . . . . . . . . . . . . . . . . . . . . . . . . . . . . . . . . . . . . . . . . . . . . . . . . . . . . . . . . . . . . . . . . . . . . . . . . . . . . . . . . . . . . . . . . . . . . . . . . . . . . . . . . . . . . . . . . . . . . . . . . . . . . . . . . . . . . . . . . . . . . . . . . . . . . . . . . . . . . . . . . . . . . . . . . . . . . . . . . . . . . . . . . . . . . . . . . . . . . . . . . . . . . . . . . . . .  . . . . . . . . . . . . . . . . . . . . . . . . . . . . . . . . . . . . . . . . . . . . . . . . . . . . . . . . . . . . . . . . . . . . . . . . . . . . . . . . . . . . . . . . . . . . . . . . . . . . . . . . . . . . . . . . . . . . . . . . . . . . . . . . . . . . . . . . . . . . . . . . . . . . . . . . . . . . . . . . . . . . . . . . . . . . . . . . . . . . . . . . . . . . . . . . . . . . . . . . . . . . . . . . . . . . . . . . . . . . . . . . . . . . . . . . . . . . . . . . . . . . . . . . . . . . . . . . . . . . . . . . . . . . . . . . . . . . . . . . . . . . . . . . . . . . . . . . . . . . . . . . . . . . . . . . . . . . . . . . . . . . . . . . . . . . . . . . . . . . . . . . . . . . . . . . . . . . . . . . . . . . . . . . . . . . . . . . . . . . . . . . . . . . . . . . . . . . . . . . . . . . . . . . . . . . . . . . . . . . . . . . . . . . . . . . . . . . . . . . . . . . . . . . . . . . . . . . . . . . . . . . . . . . . . . . . . . . . . . . . . . . . . . . . . . . . . . . . . . . . . . . . . . . . . . . . . . . . . . . . . . . . . . . . . . . . . . . . . . . . . . . . . . . . . . . . . . . . . . . . . . . . . . . . . . . . . . . . . . . . . . . . . . . . . . . . . . . . . . . . . . . . . . . . . . . . . . . . . . . . . . . . . . . . . . . . . . . . . . . . . . . . . . . . .  . . . . . . . . . . . . . . . . . . . . . . . . . . . . . . . . . . . . . . . . . . . . . . . . . . . . . . . . . . . . . . . . . . . . . . . . . . . . . . . . . . . . . . . . . . . . . . . . . . . . . . . . . . . . . . . . . . . . . . . . . . . . . . . . . . . . . . . . . . . . . . . . . . . . . . . . . . . . . . . . . . . . . . . . . . . . . . . . . . . . . . . . . . . . . . . . . . . . . . . . . . . . . . . . . . . . . . . . . . . . . . . . . . . . . . . . . . . . . . . . . . . . . . . . . . . . . . . . . . . . . . . . . . . . . . . . . . . . . . . . . . . . . . . . . . . . . . . . . . . . . . . . . . . . . . . . . . . . . . . . . . . . . . . . . . . . . . . . . . . . . . . . . . . . . . . . . . . . . . . . . . . . . . . . . . . . . . . . . . . . . . . . . . . . . . . . . . . . . . . . . . . . . . . . . . . . . . . . . . . . . . . . . . . . . . . . . . . . . . . . . . . . . . . . . . . . . . . . . . . . . . . . . . . . . . . . . . . . . . . . . . . . . . . . . . . . . . . . . . . . . . . . . . . . . . . . . . . . . . . . . . . . . . . . . . . . . . . . . . . . . . . . . . . . . . . . . . . . . . . . .                                              ................................................................................................................................................................................ .............................................................................................................................................................................................................................................................................................................................................................................................................................................................................................................                                                                             .  0 . Marland Kitchen .......................................................................................................................................................................................................................................................................................................................................................................................................................................................................................................................................................................................................................................................................................................................................................................................................................................................................................................................................................................................................................................................... .             .                                                                                                                                                                                                                                Participation Level: {BHH PARTICIPATION NGEXB:28413}  Behavioral Response: {Appearance:22683}{BHH LEVEL OF CONSCIOUSNESS:22305}{BHH MOOD:22306}  Type of Therapy: {CHL AMB BH Type of Therapy:21022741}  Treatment Goals addressed: {CHL AMB BH Treatment Goals Addressed:21022754}  Interventions: {CHL AMB BH Type of Intervention:21022753}  Summary: Rynn Markiewicz is a 61 y.o. female who presents with ***.   Suicidal/Homicidal: {BHH YES OR NO:22294}{yes/no/with/without intent/plan:22693}  Therapist Response: ***  Plan: Return again in *** weeks.  Diagnosis: Axis I: {psych axis 1:31909}    Axis II: {psych axis 2:31910}    Soraida Vickers,JUDITH A, LCSW 06/09/2011

## 2011-06-09 NOTE — Progress Notes (Signed)
   THERAPIST PROGRESS NOTE  Session Time: 11:00 - 12:00  Participation Level: Active  Behavioral Response: NeatAlertEuthymic  Type of Therapy: Individual Therapy  Treatment Goals addressed: Anxiety  Interventions: Motivational Interviewing and Supportive  Summary: Robin Conrad is a 61 y.o. 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  Today she reported that she helped with the homeless shelter workshop on making crosses - they loved it and she was really touched with one women's spirituality.  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  She sees a lot of options with making crosses.   She continues to use her method of doing the art with doodling as a way of dealing with her anxiety.  She has more of an excited and passionate energy now and much less of an anxious energy.  She was also pleased that a woman in the church acknowledged her by saying that she had given up a lot to come here - a job she was passionate about, her friends and her involvement in another congregation.  She is starting to feel that she is being recognized for her special talents.  She loves being able to use her creative side in her projects now.  There is a different way she speaks of her husband - more softness and appreciation of his differences.  There is no question she is doing much better.  She continues to want to be in counseling regularly  -  Need to give her some time to integrate her changes and then need to bring up the possibility of termination.  She will have some anxiety about that but feel that she needs to have a feeling that she has improved and leaving therapy is a good thing.  Believe she has dependency on therapy that she has had not just with me.  She has felt that she needs a therapist to have a place to deal with herself.  She is developing those friendships  which will give her that opportunity and she is becoming more confident about being herself and expressing her different feelings about issues.    Suicidal/Homicidal: Nowithout intent/plan  Plan: Return again in 2 weeks.  Diagnosis: Axis I: Anxiety Disorder NOS    Axis II: Deferred    [name],JUDITH A, LCSW 06/09/2011

## 2011-06-09 NOTE — Patient Instructions (Signed)
Continue workshops Good luck with new one [employer]

## 2011-06-16 ENCOUNTER — Encounter: Payer: Self-pay | Admitting: *Deleted

## 2011-06-21 ENCOUNTER — Telehealth: Payer: Self-pay | Admitting: *Deleted

## 2011-06-21 NOTE — Telephone Encounter (Signed)
Pt states she will not be able to make the f/u appt you wanted b/c she will be out of town. She ask to let you know that she cut the Ambien in 1/2 but she couldn't do without the flexeril. She also states that the dizziness has stopped.

## 2011-06-23 ENCOUNTER — Ambulatory Visit (INDEPENDENT_AMBULATORY_CARE_PROVIDER_SITE_OTHER): Payer: PRIVATE HEALTH INSURANCE | Admitting: Licensed Clinical Social Worker

## 2011-06-23 DIAGNOSIS — F411 Generalized anxiety disorder: Secondary | ICD-10-CM

## 2011-06-23 DIAGNOSIS — F419 Anxiety disorder, unspecified: Secondary | ICD-10-CM

## 2011-06-23 NOTE — Progress Notes (Signed)
   THERAPIST PROGRESS NOTE  Session Time: 11: 10 -12:05  Participation Level: Active  Behavioral Response: Well GroomedAlertEuthymic  Type of Therapy: Individual Therapy  Treatment Goals addressed: Anxiety  Interventions: Motivational Interviewing and Solution Focused  Summary: Robin Conrad is a 61 y.o. female who presents with anxiety and depression.  Today Robin Conrad reported on her positive connections in the church - doing these workshops.  She likes the prayer in color - she did that in Sunday school and there were men in the class and she did not know if they would participate but they did and later they showed her the work they had done - which was in black and white.  She continues to get a lot out of zen tangle - it really relaxes her.  She has decided that she gets in touch with her father and sister out of some obligation and tends to feel guilty about not getting in touch - they do not get in touch with her and give her comments if she does not.  She will only get in touch if she feels that she really wants to.  She is tired of feeling controlled by what others want of her.  Suicidal/Homicidal: Nowithout intent/plan  Plan: Return again in 2 weeks.  Diagnosis: Axis I: Anxiety Disorder NOS    Axis II: Deferred    Walter Min,JUDITH A, LCSW 06/23/2011

## 2011-06-29 ENCOUNTER — Other Ambulatory Visit: Payer: Self-pay | Admitting: *Deleted

## 2011-06-29 ENCOUNTER — Other Ambulatory Visit: Payer: Self-pay | Admitting: Family Medicine

## 2011-06-29 ENCOUNTER — Ambulatory Visit: Payer: PRIVATE HEALTH INSURANCE | Admitting: Physical Therapy

## 2011-06-29 MED ORDER — GABAPENTIN 400 MG PO CAPS: 400.0000 mg | ORAL_CAPSULE | Freq: Four times a day (QID) | ORAL | Status: DC

## 2011-07-05 ENCOUNTER — Ambulatory Visit: Payer: Self-pay | Admitting: Physician Assistant

## 2011-07-20 ENCOUNTER — Ambulatory Visit
Admission: RE | Admit: 2011-07-20 | Discharge: 2011-07-20 | Disposition: A | Discharge status: Home / Self Care | Payer: PRIVATE HEALTH INSURANCE | Source: Ambulatory Visit | Attending: Family Medicine | Admitting: Family Medicine

## 2011-07-20 DIAGNOSIS — Z1231 Encounter for screening mammogram for malignant neoplasm of breast: Secondary | ICD-10-CM

## 2011-07-22 ENCOUNTER — Ambulatory Visit (HOSPITAL_COMMUNITY): Payer: Self-pay | Admitting: Licensed Clinical Social Worker

## 2011-07-23 ENCOUNTER — Ambulatory Visit (HOSPITAL_COMMUNITY): Payer: Self-pay | Admitting: Licensed Clinical Social Worker

## 2011-08-04 ENCOUNTER — Ambulatory Visit (INDEPENDENT_AMBULATORY_CARE_PROVIDER_SITE_OTHER): Payer: PRIVATE HEALTH INSURANCE | Admitting: Licensed Clinical Social Worker

## 2011-08-04 DIAGNOSIS — F411 Generalized anxiety disorder: Secondary | ICD-10-CM

## 2011-08-04 NOTE — Progress Notes (Signed)
   THERAPIST PROGRESS NOTE  Session Time: 10:14 - 11:05  Participation Level: Active  Behavioral Response: Well GroomedAlertEuthymic  Type of Therapy: Individual Therapy  Treatment Goals addressed: Anxiety  Interventions: Motivational Interviewing and Supportive  Summary: Robin Conrad is a 61 y.o. female who presents with problems with anxiety.   Robin Conrad is connecting to more people who are interesting to her.  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  She presents as a woman who is much happier with her life.She seems to have a stronger sense of herself.  Suicidal/Homicidal: No  Plan: Return again in 2 weeks.  Diagnosis: Axis I: Anxiety Disorder NOS    Axis II: Deferred    Robin Conrad,JUDITH A, LCSW 08/04/2011

## 2011-08-13 ENCOUNTER — Other Ambulatory Visit (HOSPITAL_COMMUNITY): Payer: Self-pay | Admitting: Psychiatry

## 2011-08-17 ENCOUNTER — Encounter (HOSPITAL_COMMUNITY): Payer: Self-pay | Admitting: Psychiatry

## 2011-08-17 ENCOUNTER — Ambulatory Visit (INDEPENDENT_AMBULATORY_CARE_PROVIDER_SITE_OTHER): Payer: PRIVATE HEALTH INSURANCE | Admitting: Psychiatry

## 2011-08-17 VITALS — BP 111/62 | Ht 66.0 in | Wt 131.0 lb

## 2011-08-17 DIAGNOSIS — F329 Major depressive disorder, single episode, unspecified: Secondary | ICD-10-CM

## 2011-08-17 DIAGNOSIS — F411 Generalized anxiety disorder: Secondary | ICD-10-CM

## 2011-08-17 MED ORDER — CITALOPRAM HYDROBROMIDE 10 MG PO TABS: 10.0000 mg | ORAL_TABLET | Freq: Every day | ORAL | Status: DC

## 2011-08-17 MED ORDER — BUPROPION HCL ER (XL) 150 MG PO TB24: 450.0000 mg | ORAL_TABLET | ORAL | Status: DC

## 2011-08-17 NOTE — Progress Notes (Signed)
   [facility] Follow-up Outpatient Visit  [facility] Jan 12, 1951   Subjective: The patient is a 61 year old female who was been followed by [facility] since October of 2011. She is currently diagnosed with generalized anxiety disorder and Maj. depressive disorder. She is doing quite well. She continues to complain of constipation and still thinks is to use to the Celexa. The patient is asking since she is doing so well, that she decreased the Celexa to see if this would ease her GI issues. She continues to be active in [employer]. 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  She attended some [employer] Meetings with a massage therapist she met.  Pt endorses good sleep and appetite.  She wishes her husband would partake in treatment because she feels he could benefit from it.  Mood is stable.  Filed Vitals:   08/17/11 1031  BP: 111/62    Mental Status Examination  Appearance: Casually dressed Alert: Yes Attention: good  Cooperative: Yes Eye Contact: Good Speech: Regular rate rhythm and volume, nonpressured Psychomotor Activity: Normal Memory/Concentration: Intact Oriented: person, place, time/date and situation Mood: Euthymic Affect: Full Range Thought Processes and Associations: Logical Fund of Knowledge: Fair Thought Content: No suicidal or homicidal thoughts Insight: Fair Judgement: Fair  Diagnosis: Generalized anxiety disorder, major depressive disorder  Treatment Plan: We will decrease Celexa to 10 mg daily. We will continue Xanax, Wellbutrin XL, and a median. I will see the patient back in 6 weeks. Patient to call with concerns.  Jamse Mead, MD

## 2011-08-18 ENCOUNTER — Ambulatory Visit (HOSPITAL_COMMUNITY): Payer: Self-pay | Admitting: Licensed Clinical Social Worker

## 2011-08-26 ENCOUNTER — Other Ambulatory Visit: Payer: Self-pay | Admitting: Family Medicine

## 2011-08-27 HISTORY — PX: OTHER SURGICAL HISTORY: SHX169

## 2011-08-30 ENCOUNTER — Other Ambulatory Visit: Payer: Self-pay | Admitting: Family Medicine

## 2011-09-15 ENCOUNTER — Ambulatory Visit (INDEPENDENT_AMBULATORY_CARE_PROVIDER_SITE_OTHER): Payer: PRIVATE HEALTH INSURANCE | Admitting: Licensed Clinical Social Worker

## 2011-09-15 DIAGNOSIS — F411 Generalized anxiety disorder: Secondary | ICD-10-CM

## 2011-09-15 NOTE — Progress Notes (Signed)
   THERAPIST PROGRESS NOTE  Session Time: 10:00 - 10:55  Participation Level: Active  Behavioral Response: CasualAlertEuthymic  Type of Therapy: Individual Therapy  Treatment Goals addressed: Anxiety  Interventions: Motivational Interviewing and Supportive  Summary: Robin Conrad is a 61 y.o. female who presents with problems with anxiety   Robin Conrad reported on the fact that she had bladder surgery.  She has not been aware of her need to void and then have to rush.  She was not emptying her bladder.  She had a device put in her bladder which will allow for awareness "like a pacemaker for the bladder".  It seems to be working. Her husband is all about work - it feels like everyone else is more important than she is. Comes home, checks in and goes back to work.  She feels he is very depressed, He does not laugh and is not involved with her.  She hopes that when he retires that the can be closer.  She feels that she would have left him but did not know where to go.  She feels that she is getting braver about saying things to him.  He does want to retire but timing is important financially. Wondered with her if he would talk to a doctor about his mood. It is difficult because they are not communicating well.   Suicidal/Homicidal: No  Plan: Return again in 2 weeks.  Diagnosis: Axis I: Anxiety Disorder NOS    Axis II: Deferred    Cobe Viney,JUDITH A, LCSW 09/15/2011

## 2011-09-28 ENCOUNTER — Encounter (HOSPITAL_COMMUNITY): Payer: Self-pay | Admitting: Psychiatry

## 2011-09-28 ENCOUNTER — Ambulatory Visit (INDEPENDENT_AMBULATORY_CARE_PROVIDER_SITE_OTHER): Payer: PRIVATE HEALTH INSURANCE | Admitting: Psychiatry

## 2011-09-28 VITALS — BP 102/64 | Ht 66.0 in | Wt 120.0 lb

## 2011-09-28 DIAGNOSIS — F329 Major depressive disorder, single episode, unspecified: Secondary | ICD-10-CM

## 2011-09-28 DIAGNOSIS — F411 Generalized anxiety disorder: Secondary | ICD-10-CM

## 2011-09-28 NOTE — Progress Notes (Signed)
   [facility] Follow-up Outpatient Visit  [facility] 1950-05-01   Subjective: The patient is a 61 year old female who was been followed by [facility] since October of 2011. She is currently diagnosed with generalized anxiety disorder and Maj. depressive disorder. She is doing quite well. Patient has had surgery for an interstem bladder device. This is to help regulate her urination. She feels that her bowels have been more regulated since the surgery. She is also using some Senokot and has since Thursday which is making a difference. Patient's daughter is very concerned about her dosage of Ambien. Patient has been trying to take half a tablet rather than a full tablet. She states that she sleeps okay with that, but wake up in the night. She also does not sleep in. Her dog will get her up. Also her thyroid medicine has to be taken one hour prior to breakfast which gets her up. She does not feel that the change in Celexa has interfered with her sleep. She is worried about her anxiety coverage over the next year. 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 She is concerned about her husband's constant low mood. It makes it very hard to be around. Filed Vitals:   09/28/11 1020  BP: 102/64    Mental Status Examination  Appearance: Casually dressed Alert: Yes Attention: good  Cooperative: Yes Eye Contact: Good Speech: Regular rate rhythm and volume, nonpressured Psychomotor Activity: Normal Memory/Concentration: Intact Oriented: person, place, time/date and situation Mood: Euthymic Affect: Full Range Thought Processes and Associations: Logical Fund of Knowledge: Fair Thought Content: No suicidal or homicidal thoughts Insight: Fair Judgement: Fair  Diagnosis: Generalized anxiety disorder, major depressive disorder  Treatment Plan: We will continue the Celexa at 10 mg daily. We will also continue Xanax, Wellbutrin XL, and Ambien. I will  see the patient back in 4 weeks. Patient to call with concerns. We may consider changing SSRIs at next appointment.  Jamse Mead, MD

## 2011-09-30 ENCOUNTER — Ambulatory Visit (INDEPENDENT_AMBULATORY_CARE_PROVIDER_SITE_OTHER): Payer: PRIVATE HEALTH INSURANCE | Admitting: Licensed Clinical Social Worker

## 2011-09-30 DIAGNOSIS — F329 Major depressive disorder, single episode, unspecified: Secondary | ICD-10-CM

## 2011-09-30 DIAGNOSIS — F411 Generalized anxiety disorder: Secondary | ICD-10-CM

## 2011-10-05 ENCOUNTER — Other Ambulatory Visit (HOSPITAL_COMMUNITY): Payer: Self-pay | Admitting: Psychiatry

## 2011-10-05 MED ORDER — ZOLPIDEM TARTRATE 5 MG PO TABS: 5.0000 mg | ORAL_TABLET | Freq: Every evening | ORAL | Status: DC | PRN

## 2011-10-11 ENCOUNTER — Telehealth: Payer: Self-pay | Admitting: Family Medicine

## 2011-10-11 ENCOUNTER — Other Ambulatory Visit: Payer: Self-pay | Admitting: Family Medicine

## 2011-10-11 ENCOUNTER — Ambulatory Visit (INDEPENDENT_AMBULATORY_CARE_PROVIDER_SITE_OTHER): Payer: PRIVATE HEALTH INSURANCE | Admitting: Family Medicine

## 2011-10-11 ENCOUNTER — Encounter: Payer: Self-pay | Admitting: Family Medicine

## 2011-10-11 VITALS — BP 121/62 | HR 94 | Resp 16 | Wt 128.0 lb

## 2011-10-11 DIAGNOSIS — E039 Hypothyroidism, unspecified: Secondary | ICD-10-CM

## 2011-10-11 DIAGNOSIS — F419 Anxiety disorder, unspecified: Secondary | ICD-10-CM

## 2011-10-11 DIAGNOSIS — IMO0001 Reserved for inherently not codable concepts without codable children: Secondary | ICD-10-CM

## 2011-10-11 DIAGNOSIS — G4700 Insomnia, unspecified: Secondary | ICD-10-CM

## 2011-10-11 DIAGNOSIS — M797 Fibromyalgia: Secondary | ICD-10-CM

## 2011-10-11 DIAGNOSIS — F411 Generalized anxiety disorder: Secondary | ICD-10-CM

## 2011-10-11 NOTE — Progress Notes (Signed)
  Subjective:    Patient ID: Robin Conrad, female    DOB: Oct 25, 1950, 61 y.o.   MRN: [id number]  HPI Gave me updates.  Has had interstim placed for her bowel and bladder problems in June.  Says working well overall. Still some occ discomfort if bends frequently.    AR - doing well.  Still getting allergy shots.    Still get pain in the vaginal area.  No longer using a cream.  Says worried about getting a pap from her gyn bc fo vaginal dryness. I told her that she can certainly use an estrogen cream for about a month before her Pap smear and that may help..     Review of Systems     Objective:   Physical Exam  Constitutional: She is oriented to person, place, and time. She appears well-developed and well-nourished.  HENT:  Head: Normocephalic and atraumatic.  Cardiovascular: Normal rate, regular rhythm and normal heart sounds.   Pulmonary/Chest: Effort normal and breath sounds normal.  Neurological: She is alert and oriented to person, place, and time.  Skin: Skin is warm and dry.  Psychiatric: She has a normal mood and affect. Her behavior is normal.          Assessment & Plan:  Hypothyroid - Recheck TSH.  Will call when needs refills.  Fibromyalgia - Stable. Doing well.    AR- Still getting her allergy shots.  Recently changed to Advair.  Off the [place] bc Advair may go generic soon.    Anxiety  - well-controlled on Wellbutrin and Celexa.  Insomnia - she would like a refill on her Ambien. I discussed with her that there are similar studies showing prolonged and increased blood levels in women and there is any recommendation to decrease the dose to 5 mg in women. I will refill her prescription but at the lower dose for 5 though in reality she was already cutting the 10 mg tabs in half.

## 2011-10-11 NOTE — Telephone Encounter (Signed)
Please call the lab and see if they can add a liver function or hepatic panel to her thyroid test, or maybe even a BMP.

## 2011-10-12 LAB — HEPATIC FUNCTION PANEL
ALT: 26 U/L (ref 0–35)
AST: 29 U/L (ref 0–37)
Bilirubin, Direct: 0.1 mg/dL (ref 0.0–0.3)
Total Protein: 6.6 g/dL (ref 6.0–8.3)

## 2011-10-12 LAB — BASIC METABOLIC PANEL
BUN: 19 mg/dL (ref 6–23)
CO2: 29 mEq/L (ref 19–32)
Chloride: 104 mEq/L (ref 96–112)
Potassium: 4 mEq/L (ref 3.5–5.3)

## 2011-10-12 NOTE — Telephone Encounter (Signed)
Liver function and BMP added to test

## 2011-10-13 ENCOUNTER — Other Ambulatory Visit: Payer: Self-pay | Admitting: Family Medicine

## 2011-10-14 ENCOUNTER — Ambulatory Visit (INDEPENDENT_AMBULATORY_CARE_PROVIDER_SITE_OTHER): Payer: PRIVATE HEALTH INSURANCE | Admitting: Licensed Clinical Social Worker

## 2011-10-14 DIAGNOSIS — F329 Major depressive disorder, single episode, unspecified: Secondary | ICD-10-CM

## 2011-10-14 DIAGNOSIS — F411 Generalized anxiety disorder: Secondary | ICD-10-CM

## 2011-10-14 NOTE — Progress Notes (Signed)
   THERAPIST PROGRESS NOTE  Session Time: 2:00 - 2:50  Participation Level: Active  Behavioral Response: CasualAlertEuthymic  Type of Therapy: Individual Therapy  Treatment Goals addressed: Anxiety  Interventions: Motivational Interviewing and Supportive  Summary: Azha Constantin is a 61 y.o. female who presents with anxiety and depression.   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   She mainly discussed her frustration in living in houses that are not hers and she has to go through committees to get things done.  Then she also has her husband not wanting her to talk about some things or not to ask for some things which is really annoying.   She finds she is anxious about her husband retiring - she wants him to retire but she also wants her time she has gotten used to.  He has always let work interfere with anything that they have had to do.  There were two trips planned for her 42 th BD and her 57 th BD and they were both cancelled because of work problems.  He acts like he is indispensable.Even when they do get away he is checking in at the office.  So she feels she has never had his full attention.  He is also controlling. So she is wondering what he will try to control when he is Midwife.He will probably have to find something else to do for he is not used to not working. She has some old resentments that she needs to deal with .  Suicidal/Homicidal: Nowithout intent/plan  Plan: Return again in 2 weeks.  Diagnosis: Axis I: Anxiety Disorder NOS and Depressive Disorder NOS    Axis II: Deferred    Tatsuya Okray,JUDITH A, LCSW 10/14/2011

## 2011-10-19 NOTE — Progress Notes (Signed)
   THERAPIST PROGRESS NOTE  Session Time: 9:05 - 9:55  Participation Level: Active  Behavioral Response: NeatAlertEuthymic  Type of Therapy: Individual Therapy  Treatment Goals addressed: Anxiety and Coping  Interventions: Motivational Interviewing and Supportive  Summary: Robin Conrad is a 61 y.o. female who presents with anxiety and depression.  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  It is called Zentangle - it is basically creative doodling.  It has become quite the rage.  She does it a lot and makes cards with her product.  She might get involved in setting up some workshops on this new from of art.  She also talked about the lack of communication with her husband.  He is tired and stressed with his work so he does not communicate very much.  He has always had a tendency to be quiet and be a man of few words and she has learned how to do without him in many ways but she wishes they could have some conversation when he is available.  .   Suicidal/Homicidal: Nowithout intent/plan  Plan: Return again in 2 weeks.  Diagnosis: Axis I: Anxiety Disorder NOS    Axis II: Deferred    Loukisha Gunnerson,JUDITH A, LCSW 10/19/2011

## 2011-10-28 ENCOUNTER — Ambulatory Visit (HOSPITAL_COMMUNITY): Payer: PRIVATE HEALTH INSURANCE | Admitting: Licensed Clinical Social Worker

## 2011-10-28 ENCOUNTER — Ambulatory Visit (INDEPENDENT_AMBULATORY_CARE_PROVIDER_SITE_OTHER): Payer: PRIVATE HEALTH INSURANCE | Admitting: Licensed Clinical Social Worker

## 2011-10-28 DIAGNOSIS — F411 Generalized anxiety disorder: Secondary | ICD-10-CM

## 2011-11-02 ENCOUNTER — Encounter (HOSPITAL_COMMUNITY): Payer: Self-pay | Admitting: Psychiatry

## 2011-11-02 ENCOUNTER — Ambulatory Visit (INDEPENDENT_AMBULATORY_CARE_PROVIDER_SITE_OTHER): Payer: PRIVATE HEALTH INSURANCE | Admitting: Psychiatry

## 2011-11-02 VITALS — BP 90/62 | Ht 66.0 in | Wt 130.0 lb

## 2011-11-02 DIAGNOSIS — F329 Major depressive disorder, single episode, unspecified: Secondary | ICD-10-CM

## 2011-11-02 DIAGNOSIS — F411 Generalized anxiety disorder: Secondary | ICD-10-CM

## 2011-11-02 NOTE — Progress Notes (Signed)
   [facility] Follow-up Outpatient Visit  [facility] [birth year]   Subjective: The patient is a 61 year old female who was been followed by [facility] since October of 2011. She is currently diagnosed with generalized anxiety disorder and Maj. depressive disorder. At her last appointment, I did not make any changes. She is doing quite well. She has been standing up for herself more. 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 She will be starting a 32 week Bible course. She felt it was too much to do both. She reports that her bowels are more regular. She feels like it's making a big difference in her life. Her anxiety is under control. She is sleeping well with 5 mg of Ambien. They're nights the dog is not able to wake her up. Appetite is good. The patient feels that she's in a good place. She does not want to switch SSRIs at this point. There are no other complaints. Filed Vitals:   11/02/11 1022  BP: 90/62    Mental Status Examination  Appearance: Casually dressed Alert: Yes Attention: good  Cooperative: Yes Eye Contact: Good Speech: Regular rate rhythm and volume, nonpressured Psychomotor Activity: Normal Memory/Concentration: Intact Oriented: person, place, time/date and situation Mood: Euthymic Affect: Full Range Thought Processes and Associations: Logical Fund of Knowledge: Fair Thought Content: No suicidal or homicidal thoughts Insight: Fair Judgement: Fair  Diagnosis: Generalized anxiety disorder, major depressive disorder  Treatment Plan: I will not make any changes today. I will continue the Xanax, Wellbutrin XL, Celexa, and Ambien. I will see the patient back in 3 months.  Jamse Mead, MD

## 2011-11-02 NOTE — Progress Notes (Signed)
   THERAPIST PROGRESS NOTE  Session Time: 11:00 - 11:50  Participation Level: Active  Behavioral Response: NeatAlertEuthymic  Type of Therapy: Individual Therapy  Treatment Goals addressed: Anxiety  Interventions: Motivational Interviewing and Supportive  Summary: Robin Conrad is a 61 y.o. female who presents with anxiety.  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 She is really doing well getting into her art interest and the Zentangle helps her with her anxiety - for it is relaxing.  An uncle died and her husband did the funeral - she also helped with the service.  Discussed how husband does a lot of funerals.  His sense of duty and commitment to his profession is very strong. It is why he never can really get away from his work.  He is pretty set in his ways about how he does his job also.  Makes it hard for [name] to make any suggestions.  She was trained in the ministry too so it is not like she does not have any ideas that are not pertinent.  She understands their religion well and loves its history.  Her husband has never really been abl to to get away.  The profession absorbs his life.  She is annoyed with the staff at the church because they could be doing more but do not.  This is partly her husband's fault because he does not demand it.  His profession has impacted [name]'s life in that she has to struggle for her own identify and she does not have much of his time.  She is always in competition with needs of the church and its parishioners. She feels he is depressed but he would never admit it. He also is so absorbed in the church that when he gets home he has no time or patience to listen to her. That is why she feels she will always need a therapist so she has someone to listen to her.  Discussed other ways of dealing with that need. .   Suicidal/Homicidal: Nowithout intent/plan  Plan: Return again in 2 weeks.  Diagnosis: Axis I: Anxiety  Disorder NOS    Axis II: Deferred    Kia Stavros,JUDITH A, LCSW 11/02/2011

## 2011-11-11 ENCOUNTER — Ambulatory Visit (INDEPENDENT_AMBULATORY_CARE_PROVIDER_SITE_OTHER): Payer: PRIVATE HEALTH INSURANCE | Admitting: Licensed Clinical Social Worker

## 2011-11-11 DIAGNOSIS — F411 Generalized anxiety disorder: Secondary | ICD-10-CM

## 2011-11-11 NOTE — Progress Notes (Signed)
   THERAPIST PROGRESS NOTE  Session Time: 10:05 - 10:55  Participation Level: Active  Behavioral Response: NeatAlertEuthymic  Type of Therapy: Individual Therapy  Treatment Goals addressed: Anxiety  Interventions: Motivational Interviewing and Supportive  Summary: Robin Conrad is a 61 y.o. female who presents with anxiety.  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  However, she still enjoys it.  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  She actually likes to teach.  She talked a lot about what she knows about the beginnings of the [employer].  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  She loves the process of study and then loves the chance to share what she has learned.  She is extremely knowledgeable and she is passionate about what she knows.  Suggested that she do some classes within her church.  She is going to look into the idea.  Her confidence is increasing.   Suicidal/Homicidal: Nowithout intent/plan  Plan: Return again in 2 weeks.  Diagnosis: Axis I: Anxiety Disorder NOS    Axis II: Deferred    Nicholl Onstott,JUDITH A, LCSW 11/11/2011

## 2011-11-22 ENCOUNTER — Other Ambulatory Visit: Payer: Self-pay | Admitting: Family Medicine

## 2011-11-25 ENCOUNTER — Ambulatory Visit (HOSPITAL_COMMUNITY): Payer: Self-pay | Admitting: Licensed Clinical Social Worker

## 2011-12-07 ENCOUNTER — Ambulatory Visit (INDEPENDENT_AMBULATORY_CARE_PROVIDER_SITE_OTHER): Payer: PRIVATE HEALTH INSURANCE | Admitting: Licensed Clinical Social Worker

## 2011-12-07 DIAGNOSIS — F411 Generalized anxiety disorder: Secondary | ICD-10-CM

## 2011-12-08 NOTE — Progress Notes (Signed)
   THERAPIST PROGRESS NOTE  Session Time: 1:05 - 1:55  Participation Level: Active  Behavioral Response: NeatAlertEuthymic  Type of Therapy: Individual Therapy  Treatment Goals addressed: Anxiety  Interventions: Motivational Interviewing and Supportive  Summary: Robin Conrad is a 61 y.o. female who presents with anxiety.  Renn reported that they had a good time at their conference. She apologized again about the mix up in her appointment.  Reassured that it was not a problem for she never missed or cancelled inappropriately - just concerned because it was not like her to miss that way.  She discussed some things that she is doing with her art - she is learning more and more  She even has a child as a Consulting civil engineer - a parent asked her to work with her daughter.  For she loves to draw and would like some instructions.  She explained that she was not a Runner, broadcasting/film/video but she would be happy to work with her.  She is having fun with that.  She has found out that her husband has referred to her as an Tree surgeon who is always learning new things about her chosen field - she was pleased and surprised that he made those comments publicly.  Her husband would love to retire but has about 3 more years.  She notices he is tired.  And she always comments about what a wonderful preacher he is.  She was to give a talk on Carleene Mains mother and she got the time mixed up and ended up being late.  She is a wealth of information and she started the talk and they wanted her to come back.  When she does anything it is very thorough and with a lot of knowledge.  She is clearly more comfortable with herself and her own skills..   Suicidal/Homicidal: Nowithout intent/plan  Plan: Return again in 2 weeks.  Diagnosis: Axis I: Anxiety Disorder NOS    Axis II: Deferred    [name],JUDITH A, LCSW 12/08/2011

## 2011-12-09 ENCOUNTER — Other Ambulatory Visit: Payer: Self-pay | Admitting: Family Medicine

## 2011-12-21 ENCOUNTER — Ambulatory Visit (INDEPENDENT_AMBULATORY_CARE_PROVIDER_SITE_OTHER): Payer: PRIVATE HEALTH INSURANCE | Admitting: Licensed Clinical Social Worker

## 2011-12-21 DIAGNOSIS — F411 Generalized anxiety disorder: Secondary | ICD-10-CM

## 2011-12-21 NOTE — Progress Notes (Signed)
   THERAPIST PROGRESS NOTE  Session Time: 11:10 - 12:00  Participation Level: Active  Behavioral Response: NeatAlertEuthymic  Type of Therapy: Individual Therapy  Treatment Goals addressed: Communication: with people from church and daughter  Interventions: Motivational Interviewing and Supportive  Summary: Robin Conrad is a 61 y.o. female who presents with her self development.  She is in good spirits today.  ]She is taking care of her granddaughter who is three and she is enjoying that - she may be taking care of her more next year for her daughter is pregnant again and will need more help.   She is happy about it.  She is a smart little girl and strong willed.  She talked about [place] and how it is different from any area she has lived in.  The people are rigid and very conservative. The people in the church are mainly into gossip and money.  She has developed a friendship with a woman who is the wife of a pastor in the [employer] - they have a lot of good communication and understands what it is to be the pastor's wife.  However, she was treated differently in her church  -  more like a queen and Valina says that she is spoiled.  She is concerned about her husband and how much he would like to retire.  She is concerned about how tired he gets.   She is please that the group she was late to last time want her to come back and do more of her talk   She has a way of talking about the people she knows about that is interesting and very lively.  Commented to her that she is doing so much better than when she first came into counseling  Suicidal/Homicidal: Nowithout intent/plan  Plan: Return again in 2 weeks.  Diagnosis: Axis I: Anxiety Disorder NOS    Axis II: Deferred    Lekendrick Alpern,JUDITH A, LCSW 12/21/2011

## 2012-01-04 ENCOUNTER — Ambulatory Visit (INDEPENDENT_AMBULATORY_CARE_PROVIDER_SITE_OTHER): Payer: PRIVATE HEALTH INSURANCE | Admitting: Licensed Clinical Social Worker

## 2012-01-04 DIAGNOSIS — F411 Generalized anxiety disorder: Secondary | ICD-10-CM

## 2012-01-04 NOTE — Progress Notes (Signed)
THERAPIST PROGRESS NOTE  Session Time: 11:05 - 12:15  Participation Level: Active  Behavioral Response: NeatAlertAnxious  Type of Therapy: Individual Therapy  Treatment Goals addressed: Anxiety  Interventions: Motivational Interviewing and Supportive  Summary: Robin Conrad is a 61 y.o. female who presents with anxiety.  Robin Conrad reporting that there was a crisis last week.  Her husband was at a meeting at church and there was a complaint made against him and no one else stood up for him so he made the decision that he was done.  He had a talk with the Bishop who is being very supportive and apologized for the way he has been treated with this situation. Marland KitchenHe was also apologetic about the assignment.  He is going to send him a person who will work with him on the problems in this church.over him some one to discuss the issues with him and help him with problem solving which he needs .  Her husband has made it clear that he does not want to move from the area because of his wife's treatment.  She is still at the church but with a more stressful feeling than before.  Her husband has always been in churches where. the membership usually grows and he is seen as a really good Optician, dispensing.  She shared that she would not change treatment providers again and if they moved she would be coming back for treatment.  She had never been correctly diagnosed before she came here and she values that for she is doing better; and, it explains why she had the situations that she had in her life.  Counselor had not realized that this was the first treatment team that diagnosed her with anxiety, for she was obviously an anxious person. She has done a lot better. also with this assignment. It is not clear what the final resolution would be yet.  If they were financially in a better position, her husband would retire.  He is really ready to retire.  She wishes he would consider a hospital pastorship  but she does not believe he  will give up being in a church position.   She talked about her daughter who has bipolar illness and how it was with her when she was a teen - problems starting when she was 23 - running away and outbursts.  It was such a stressful time.  This issue came up because connected to assignments that her husband was given  They were in [place], [place] and then [place] - she had specifically not wanted [employer] because it was a city and [employer] did better in smaller areas with smaller classrooms.  Then they got assigned to [employer] - so they ended up having Erin in a small private school which was more manageable for her.  In looking at the gene pool in her family, she realized that her mother may have been bipolar- realized more in retrospect.    She knows she is struggling with her feelings in relation to the situation with her husband and the church. She leads a bible study and she had forgotten her glasses - had to go home to get so was late for her class. Then she realized that she had forgotten the DVD that she was going to play for that class.  She was able to share with the class that she was having trouble being focussed today and went on with the class - At one time those mistakes would have sent her into  a deep upset.  Instead she could use her humor and not let it get to her too badly.  She was talking a lot in this session but the talk did not have the pressure behind it like she has in the past.  Suicidal/Homicidal: Nowithout intent/plan  Plan: Return again in 2 weeks.  Diagnosis: Axis I: Generalized Anxiety Disorder    Axis II: Deferred    Aylen Stradford,JUDITH A, LCSW 01/04/2012

## 2012-01-18 ENCOUNTER — Ambulatory Visit (HOSPITAL_COMMUNITY): Payer: Self-pay | Admitting: Licensed Clinical Social Worker

## 2012-01-20 ENCOUNTER — Ambulatory Visit (INDEPENDENT_AMBULATORY_CARE_PROVIDER_SITE_OTHER): Payer: PRIVATE HEALTH INSURANCE | Admitting: Licensed Clinical Social Worker

## 2012-01-20 DIAGNOSIS — F411 Generalized anxiety disorder: Secondary | ICD-10-CM

## 2012-01-20 NOTE — Progress Notes (Signed)
   THERAPIST PROGRESS NOTE  Session Time: 9:10 - !0:00  Participation Level: Active  Behavioral Response: NeatAlertAnxious and Euthymic  Type of Therapy: Individual Therapy  Treatment Goals addressed: Anxiety  Interventions: Motivational Interviewing and Supportive  Summary: Janiylah Cooksley is a 60 y.o. female who presents with anxiety.  Giorgina is volunteering with [facility] which is transporting cancer patients to their treatment. She is meeting some interesting people.   There was one neighborhood that she was a little nervous in and the lady is very sweet.  She feels that she is rushing everywhere - she is very busy.  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  She  definitely disagreed with him and she was surprised that he had those feelings  She talked how methodist are based on crein for others and being against slavery.  It is surprising to her that lot of members do no really know th basis of M.ethodism. It Is mainly because people are go to different churches and feel it is not a problem as long as it is a Curator based church.  She values knowing the history and the beginning of ones church.  She is concerned about her husband and having to deal with the problems in the church - it is more difficult for him because he is non-confrontational.  So he holds a lot in.  She worries about his stress.She wished he would talk about it more - she does not know if he has a person to talk to as the bishop offered.  Discussed ways she could support her husband.  Suicidal/Homicidal: Nowithout intent/plan  Plan: Return again in 2 weeks.  Diagnosis: Axis I: Generalized Anxiety Disorder    Axis II: Deferred    Latiqua Daloia,JUDITH A, LCSW 01/20/2012

## 2012-02-02 ENCOUNTER — Ambulatory Visit (INDEPENDENT_AMBULATORY_CARE_PROVIDER_SITE_OTHER): Payer: PRIVATE HEALTH INSURANCE | Admitting: Psychiatry

## 2012-02-02 ENCOUNTER — Encounter (HOSPITAL_COMMUNITY): Payer: Self-pay | Admitting: Psychiatry

## 2012-02-02 VITALS — BP 92/64 | Ht 66.0 in | Wt 130.0 lb

## 2012-02-02 DIAGNOSIS — F329 Major depressive disorder, single episode, unspecified: Secondary | ICD-10-CM

## 2012-02-02 DIAGNOSIS — F411 Generalized anxiety disorder: Secondary | ICD-10-CM

## 2012-02-02 DIAGNOSIS — F331 Major depressive disorder, recurrent, moderate: Secondary | ICD-10-CM

## 2012-02-02 MED ORDER — ALPRAZOLAM 0.25 MG PO TABS: 0.2500 mg | ORAL_TABLET | Freq: Two times a day (BID) | ORAL | Status: DC | PRN

## 2012-02-02 NOTE — Progress Notes (Signed)
   [facility] Follow-up Outpatient Visit  [facility] February 09, 1951   Subjective: The patient is a 61 year old female who was been followed by [facility] since October of 2011. She is currently diagnosed with generalized anxiety disorder and Maj. depressive disorder. At her last appointment, I did not make any changes. She continues to do well. Her husband is still under a huge amount of stress. He threatened to leave the parish approximately 2-1/2 weeks ago. The Bishop had to get involved. The Bishop is trying to find someone for her husband to talk with who is not affiliated with [employer]. The patient continues to attend her Bible study. She actually shared with the Bible study what was going on her life. They are to keep that confidential. The patient's husband has 3 years left before he retires. She is hoping they can make it that long. She reports most days her good, but she still has some breakthrough anxiety. This is usually related to her husband stress. Her bowels have been regular, which helped her mood. The patient sleeps well with Ambien. She does wake easily. She reports every now and then she wishes she had a third Xanax, but usually talks herself down. Patient is maintaining her weight. Filed Vitals:   02/02/12 1126  BP: 92/64    Mental Status Examination  Appearance: Casually dressed Alert: Yes Attention: good  Cooperative: Yes Eye Contact: Good Speech: Regular rate rhythm and volume, nonpressured Psychomotor Activity: Normal Memory/Concentration: Intact Oriented: person, place, time/date and situation Mood: Euthymic Affect: Full Range Thought Processes and Associations: Logical Fund of Knowledge: Fair Thought Content: No suicidal or homicidal thoughts Insight: Fair Judgement: Fair  Diagnosis: Generalized anxiety disorder, major depressive disorder  Treatment Plan: I will not make any changes today. I will continue the Xanax,  Wellbutrin XL, Celexa, and Ambien. I will see the patient back in 3 months.  Jamse Mead, MD

## 2012-02-03 ENCOUNTER — Ambulatory Visit (HOSPITAL_COMMUNITY): Payer: Self-pay | Admitting: Licensed Clinical Social Worker

## 2012-02-03 ENCOUNTER — Emergency Department (HOSPITAL_BASED_OUTPATIENT_CLINIC_OR_DEPARTMENT_OTHER): Payer: PRIVATE HEALTH INSURANCE

## 2012-02-03 ENCOUNTER — Encounter (HOSPITAL_BASED_OUTPATIENT_CLINIC_OR_DEPARTMENT_OTHER): Payer: Self-pay | Admitting: *Deleted

## 2012-02-03 ENCOUNTER — Emergency Department (HOSPITAL_BASED_OUTPATIENT_CLINIC_OR_DEPARTMENT_OTHER)
Admission: EM | Admit: 2012-02-03 | Discharge: 2012-02-03 | Disposition: A | Discharge status: Home / Self Care | Payer: PRIVATE HEALTH INSURANCE | Attending: Emergency Medicine | Admitting: Emergency Medicine

## 2012-02-03 DIAGNOSIS — Z87448 Personal history of other diseases of urinary system: Secondary | ICD-10-CM | POA: Insufficient documentation

## 2012-02-03 DIAGNOSIS — R05 Cough: Secondary | ICD-10-CM | POA: Insufficient documentation

## 2012-02-03 DIAGNOSIS — Z79899 Other long term (current) drug therapy: Secondary | ICD-10-CM | POA: Insufficient documentation

## 2012-02-03 DIAGNOSIS — R059 Cough, unspecified: Secondary | ICD-10-CM | POA: Insufficient documentation

## 2012-02-03 DIAGNOSIS — R062 Wheezing: Secondary | ICD-10-CM | POA: Insufficient documentation

## 2012-02-03 DIAGNOSIS — R0789 Other chest pain: Secondary | ICD-10-CM

## 2012-02-03 DIAGNOSIS — R071 Chest pain on breathing: Secondary | ICD-10-CM | POA: Insufficient documentation

## 2012-02-03 DIAGNOSIS — Z87828 Personal history of other (healed) physical injury and trauma: Secondary | ICD-10-CM | POA: Insufficient documentation

## 2012-02-03 DIAGNOSIS — F411 Generalized anxiety disorder: Secondary | ICD-10-CM | POA: Insufficient documentation

## 2012-02-03 DIAGNOSIS — Z7982 Long term (current) use of aspirin: Secondary | ICD-10-CM | POA: Insufficient documentation

## 2012-02-03 DIAGNOSIS — E039 Hypothyroidism, unspecified: Secondary | ICD-10-CM | POA: Insufficient documentation

## 2012-02-03 DIAGNOSIS — J45909 Unspecified asthma, uncomplicated: Secondary | ICD-10-CM | POA: Insufficient documentation

## 2012-02-03 DIAGNOSIS — R0602 Shortness of breath: Secondary | ICD-10-CM | POA: Insufficient documentation

## 2012-02-03 DIAGNOSIS — J3489 Other specified disorders of nose and nasal sinuses: Secondary | ICD-10-CM | POA: Insufficient documentation

## 2012-02-03 HISTORY — DX: Other allergy status, other than to drugs and biological substances: Z91.09

## 2012-02-03 HISTORY — DX: Allergic rhinitis due to animal (cat) (dog) hair and dander: J30.81

## 2012-02-03 HISTORY — DX: Hypothyroidism, unspecified: E03.9

## 2012-02-03 HISTORY — DX: Unspecified asthma, uncomplicated: J45.909

## 2012-02-03 LAB — COMPREHENSIVE METABOLIC PANEL
ALT: 48 U/L — ABNORMAL HIGH (ref 0–35)
Calcium: 9.7 mg/dL (ref 8.4–10.5)
GFR calc Af Amer: 90 mL/min — ABNORMAL LOW (ref >90)
Glucose, Bld: 104 mg/dL — ABNORMAL HIGH (ref 70–99)
Sodium: 139 mEq/L (ref 135–145)
Total Protein: 6.7 g/dL (ref 6.0–8.3)

## 2012-02-03 LAB — CBC WITH DIFFERENTIAL/PLATELET
Basophils Absolute: 0 10*3/uL (ref 0.0–0.1)
Eosinophils Absolute: 0.1 10*3/uL (ref 0.0–0.7)
Eosinophils Relative: 1 % (ref 0–5)
Lymphs Abs: 1.7 10*3/uL (ref 0.7–4.0)
MCH: 31.2 pg (ref 26.0–34.0)
MCV: 90 fL (ref 78.0–100.0)
Platelets: 181 10*3/uL (ref 150–400)
RDW: 13 % (ref 11.5–15.5)

## 2012-02-03 LAB — D-DIMER, QUANTITATIVE: D-Dimer, Quant: 0.57 ug/mL-FEU — ABNORMAL HIGH (ref 0.00–0.48)

## 2012-02-03 IMAGING — CT CT ANGIO CHEST
2 of 6 series · 19 of 36 positions shown · IV contrast (APPLIED)
Comparison: Chest x-ray [DATE]

CLINICAL DATA: Short of breath and chest pain

CT ANGIOGRAPHY CHEST
TECHNIQUE: Multidetector CT imaging of the chest using the
standard protocol during bolus administration of intravenous
contrast. Multiplanar reconstructed images including MIPs were
obtained and reviewed to evaluate the vascular anatomy.
Contrast: 100mL OMNIPAQUE IOHEXOL 350 MG/ML SOLN

[Series 7: pe 1.0 b26f · axial · 0.57mm/px · z∈[-258,-23]mm · 18 of 263 slices shown]
[im 14/263  lung]
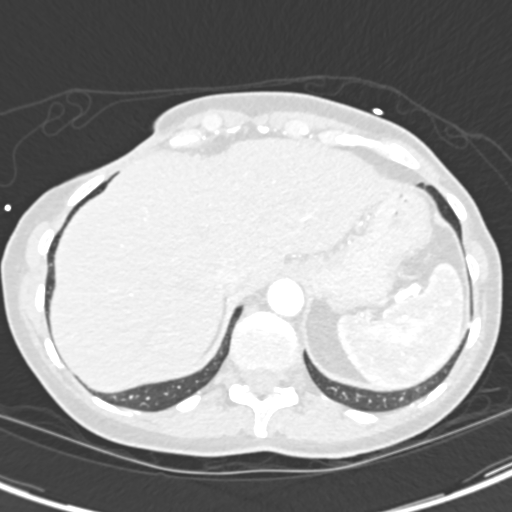
[im 27/263  mediastinal]
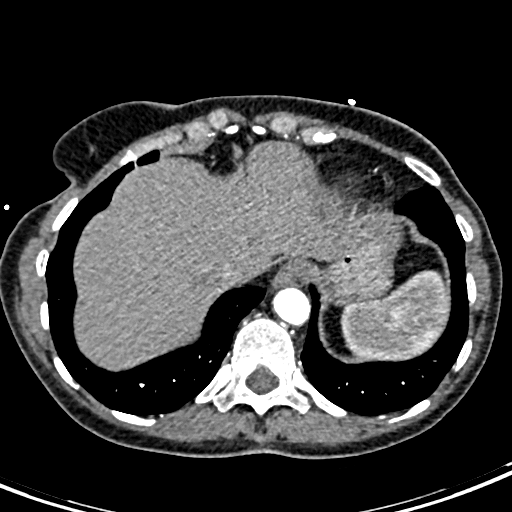
[im 40/263  lung]
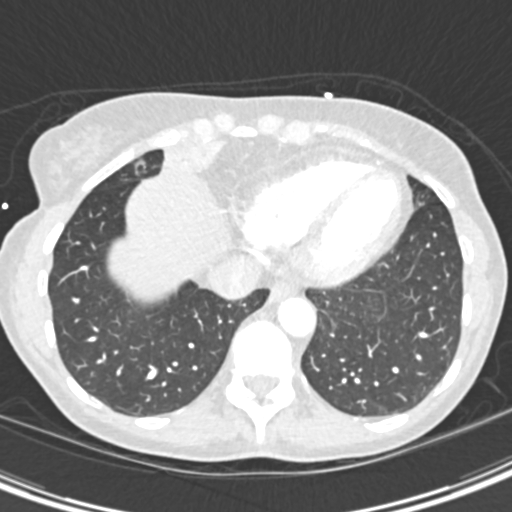
[im 53/263  mediastinal]
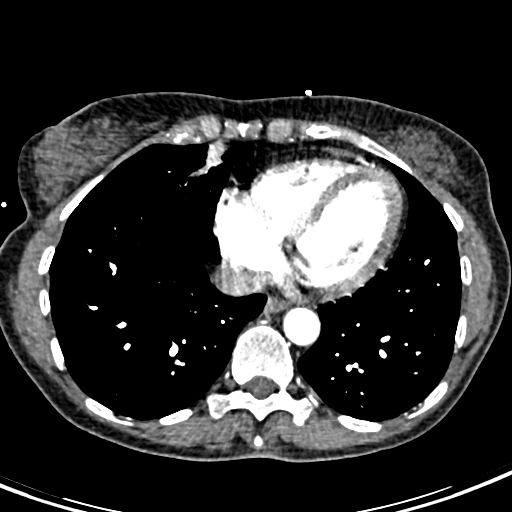
[im 66/263  lung]
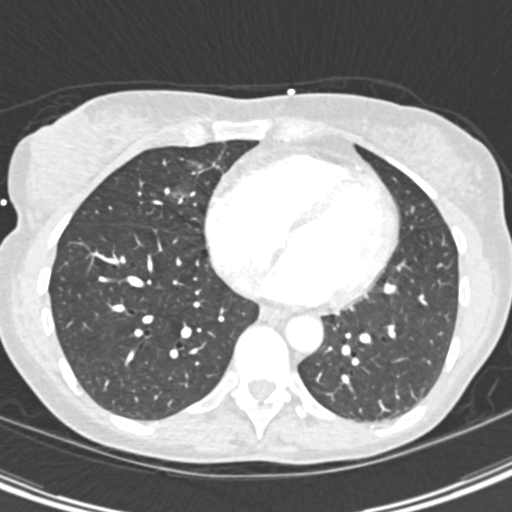
[im 79/263  mediastinal]
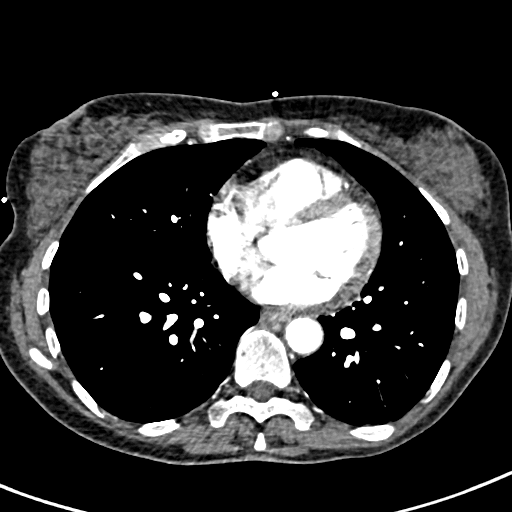
[im 92/263  lung]
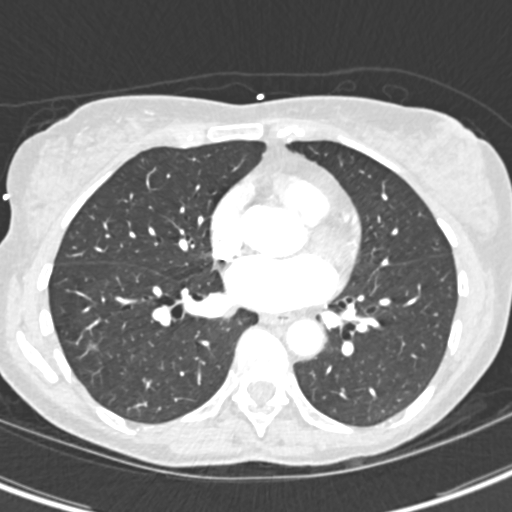
[im 105/263  mediastinal]
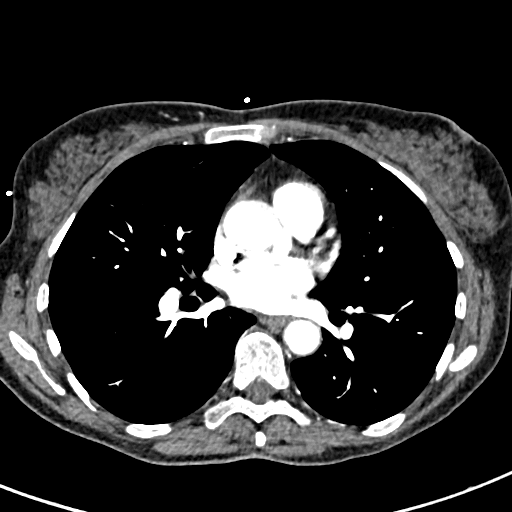
[im 118/263  lung]
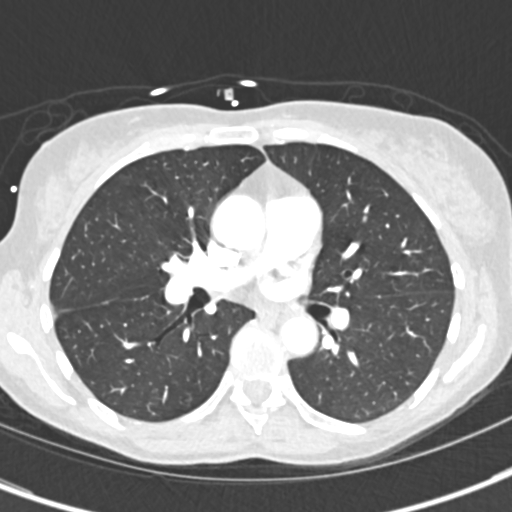
[im 145/263  mediastinal]
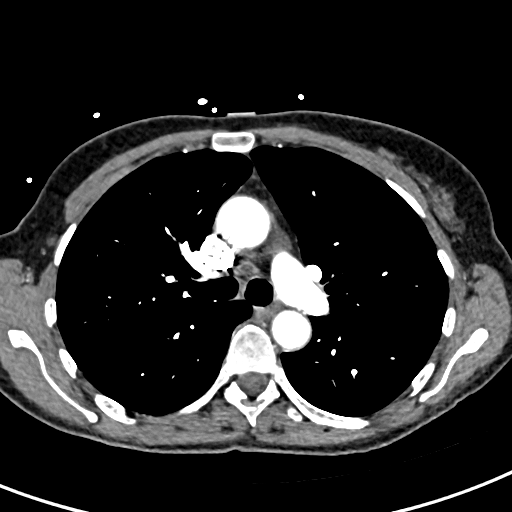
[im 158/263  lung]
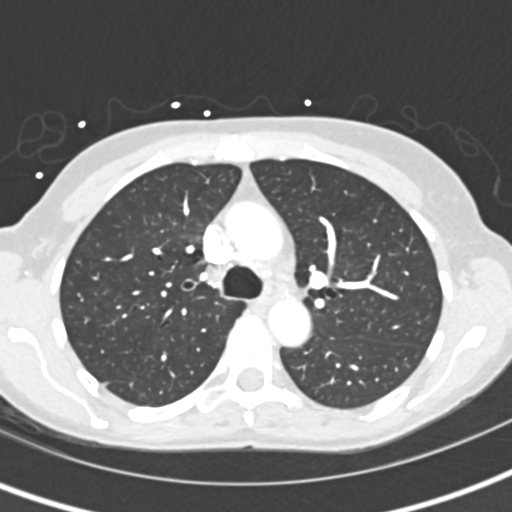
[im 171/263  mediastinal]
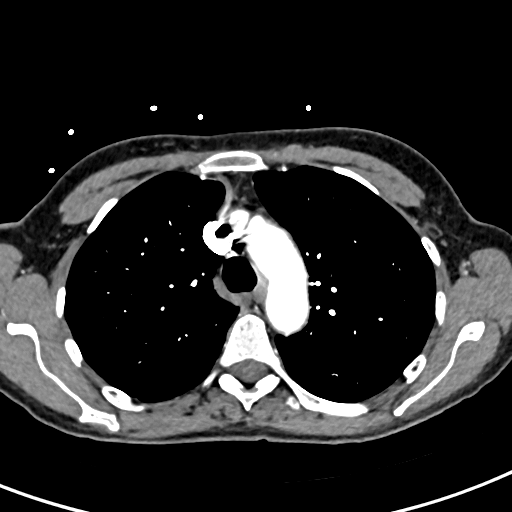
[im 184/263  lung]
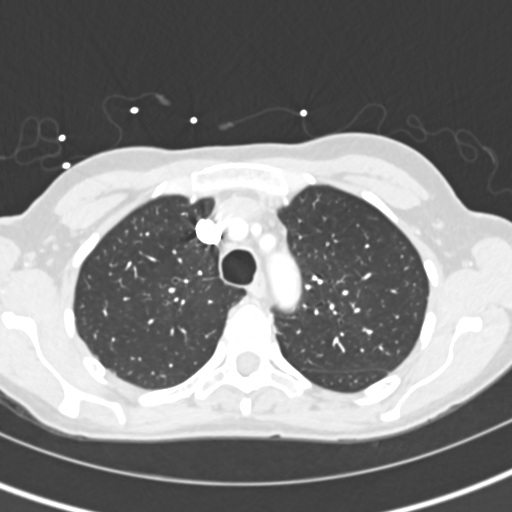
[im 197/263  mediastinal]
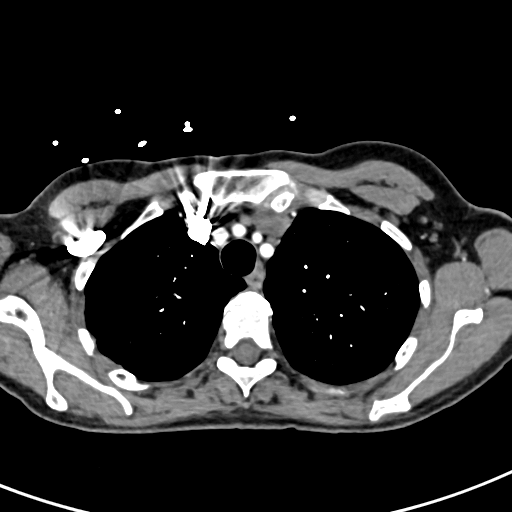
[im 210/263  lung]
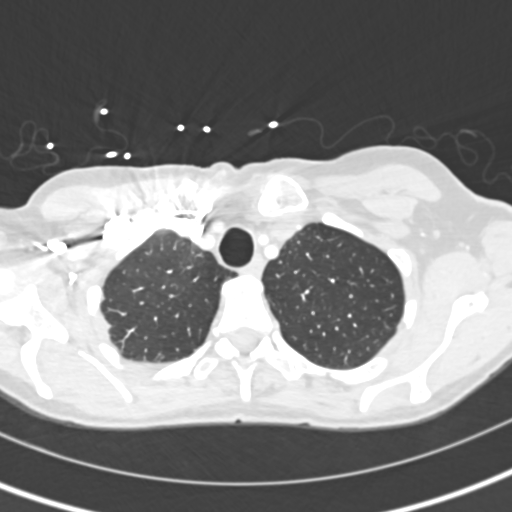
[im 223/263  mediastinal]
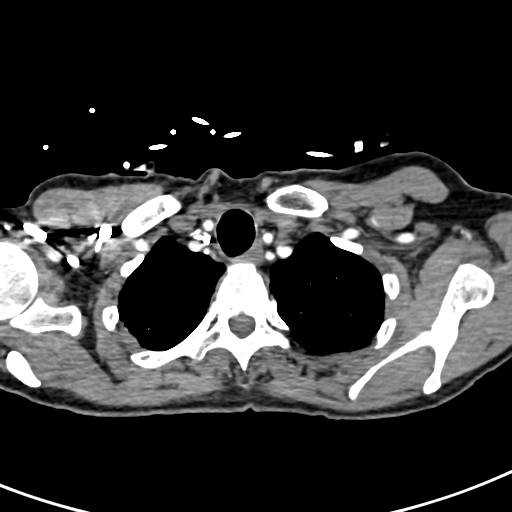
[im 236/263  lung]
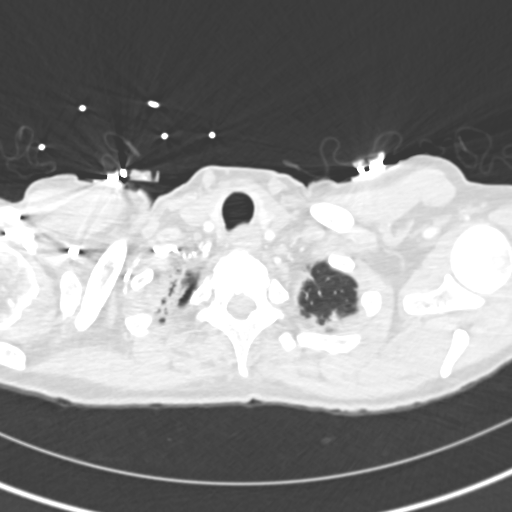
[im 249/263  mediastinal]
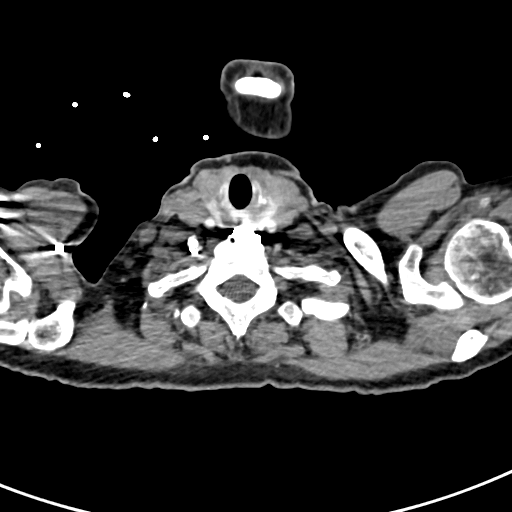

[Series 10: pe 2.0 coronal · coronal · 0.53mm/px · 1 of 96 slices shown]
[im 48/96  mediastinal]
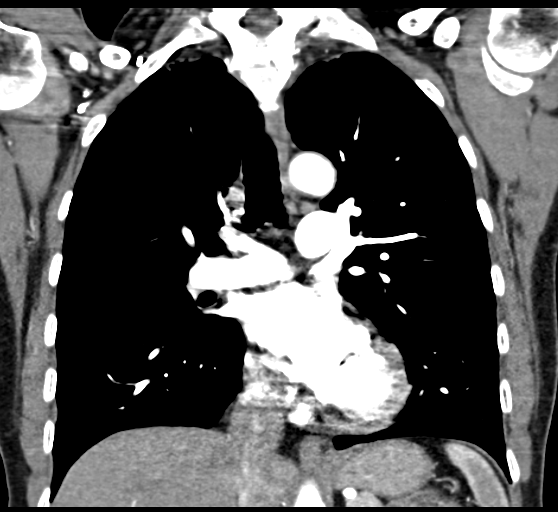

[19 of 36 positions shown; findings below may reference images not displayed]

FINDINGS: Negative for pulmonary embolism.  Negative for aortic
dissection or aneurysm.  Heart size is normal.  Negative for
pericardial effusion.

Right middle lobe density is present containing a small fleck of
calcium and some air bronchograms.  This density measures
approximately 11 x 29 mm and is most compatible with atelectasis.
No pleural effusion.  No lung nodule or adenopathy is detected.
IMPRESSION: Negative for pulmonary embolism.

Right middle lobe density, most compatible with partial collapse in
the right middle lobe.  Follow-up chest CT in 6 months is suggested
to assure stability.

## 2012-02-03 MED ORDER — KETOROLAC TROMETHAMINE 30 MG/ML IJ SOLN
30.0000 mg | Freq: Once | INTRAMUSCULAR | Status: AC
  Administered 2012-02-03: 30 mg via INTRAVENOUS
  Filled 2012-02-03: qty 1

## 2012-02-03 MED ORDER — IPRATROPIUM BROMIDE 0.02 % IN SOLN
0.5000 mg | Freq: Once | RESPIRATORY_TRACT | Status: AC
  Administered 2012-02-03: 0.5 mg via RESPIRATORY_TRACT
  Filled 2012-02-03: qty 2.5

## 2012-02-03 MED ORDER — SODIUM CHLORIDE 0.9 % IV SOLN
Freq: Once | INTRAVENOUS | Status: AC
  Administered 2012-02-03: 13:00:00 via INTRAVENOUS

## 2012-02-03 MED ORDER — TRAMADOL HCL 50 MG PO TABS: 50.0000 mg | ORAL_TABLET | Freq: Four times a day (QID) | ORAL | Status: DC | PRN

## 2012-02-03 MED ORDER — IOHEXOL 350 MG/ML SOLN
100.0000 mL | Freq: Once | INTRAVENOUS | Status: AC | PRN
  Administered 2012-02-03: 100 mL via INTRAVENOUS

## 2012-02-03 MED ORDER — ALBUTEROL SULFATE (5 MG/ML) 0.5% IN NEBU
5.0000 mg | INHALATION_SOLUTION | Freq: Once | RESPIRATORY_TRACT | Status: AC
  Administered 2012-02-03: 5 mg via RESPIRATORY_TRACT
  Filled 2012-02-03: qty 1
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
## 2012-02-03 NOTE — ED Notes (Signed)
2nd Troponin cancelled per EDP Rancour

## 2012-02-03 NOTE — ED Notes (Signed)
Pt reports she woke up at 0330 with right to mid side chest pain and wheezing- pt has hx of asthma- pt used her rescue inhaler with some relief of wheezing- still c/o right chest at this time

## 2012-02-03 NOTE — ED Notes (Signed)
Patient transported to X-ray 

## 2012-02-04 ENCOUNTER — Ambulatory Visit (INDEPENDENT_AMBULATORY_CARE_PROVIDER_SITE_OTHER): Payer: PRIVATE HEALTH INSURANCE | Admitting: Licensed Clinical Social Worker

## 2012-02-04 DIAGNOSIS — F411 Generalized anxiety disorder: Secondary | ICD-10-CM

## 2012-02-04 NOTE — Progress Notes (Signed)
   THERAPIST PROGRESS NOTE  Session Time: 1:00 - 2:00  Participation Level: Active  Behavioral Response: NeatAlertEuthymic  Type of Therapy: Individual Therapy  Treatment Goals addressed: Anxiety  Interventions: Motivational Interviewing and Supportive  Summary: Robin Conrad is a 61 y.o. female who presents with anxiety.  [name] was a little annoyed when she first came in - she showed me her appointment card and her appt was marked for 11 AM yesterday not ten as it was on my schedule. She did not say anything at the front desk - apologized for our mistake.  It is extremely unusual for her to miss an appointment.  She reported that she ended up in the ER for 4 hours yesterday - for she was having pain in chest that could be lungs or heart.  It turns out it was her asthma - but they gave her a CT scan and EKG.  Ruled out any heart problems and she did not have pneumonia.  They gave her an IV and some medication with a breathing treatment.  She felt like she was treated very well at [facility] in [place].  She was glad that the problems was not bigger than it was.  She talked about how her husband is in theese situations.  He is not empathic and he acts like it is an imposition.  He has never be one to show empathy and he does not like to be bothered when he is sick.  She feels he goes to extremes with it.  She does not have needs for a lot of attention when she is ill but a little would be nice.  He always seems annoyed and that he has to get back to work.   He has always worked a lot.  And she has been alone - sometimes she wonders what it is going to be like in retirement.  She is concerned about him because he is not happy.  She can say that she is doing well and is happy with her own endeavors.  She does love her husband and he can be sweet and funny.  They have a date night every Friday.  Her daughter and her BF are coming for Thanksgiving.  So she will have her family there.  Her grand daughter will  not be there.  She is looking forward to the holiday.  She talked of her granddaughter [name] and how difficult she can be.to deal with - very willful and has very strong temper tantrums.  Suicidal/Homicidal: Nowithout intent/plan  Therapist Response: She has a need to get what is on her mind out - without a lot of interruption - she also likes to know you have really heard her so I give feedback that indicates that with past understating of the issue or situation.  She responds well with active listening.    Plan: Return again in 2 weeks.  Diagnosis: Axis I: Generalized Anxiety Disorder    Axis II: Deferred    Janessa Mickle,JUDITH A, LCSW 02/04/2012

## 2012-02-08 ENCOUNTER — Ambulatory Visit: Payer: Self-pay | Admitting: Family Medicine

## 2012-02-08 ENCOUNTER — Telehealth (HOSPITAL_COMMUNITY): Payer: Self-pay

## 2012-02-15 ENCOUNTER — Other Ambulatory Visit: Payer: Self-pay | Admitting: Family Medicine

## 2012-02-15 ENCOUNTER — Ambulatory Visit (INDEPENDENT_AMBULATORY_CARE_PROVIDER_SITE_OTHER): Payer: PRIVATE HEALTH INSURANCE | Admitting: Family Medicine

## 2012-02-15 ENCOUNTER — Encounter: Payer: Self-pay | Admitting: Family Medicine

## 2012-02-15 VITALS — BP 108/62 | HR 92 | Ht 66.0 in | Wt 129.0 lb

## 2012-02-15 DIAGNOSIS — R0789 Other chest pain: Secondary | ICD-10-CM

## 2012-02-15 DIAGNOSIS — K219 Gastro-esophageal reflux disease without esophagitis: Secondary | ICD-10-CM

## 2012-02-15 DIAGNOSIS — Z23 Encounter for immunization: Secondary | ICD-10-CM

## 2012-02-15 DIAGNOSIS — R748 Abnormal levels of other serum enzymes: Secondary | ICD-10-CM

## 2012-02-15 DIAGNOSIS — J45909 Unspecified asthma, uncomplicated: Secondary | ICD-10-CM

## 2012-02-15 DIAGNOSIS — R7401 Elevation of levels of liver transaminase levels: Secondary | ICD-10-CM

## 2012-02-15 MED ORDER — LANSOPRAZOLE 30 MG PO CPDR: 30.0000 mg | DELAYED_RELEASE_CAPSULE | Freq: Every day | ORAL | Status: DC

## 2012-02-15 NOTE — Progress Notes (Signed)
  Subjective:    Patient ID: Robin Conrad, female    DOB: Nov 26, 1950, 61 y.o.   MRN: [id number]  HPI  She went to [facility] ED for Chest wall pain on 02/03/12.  CP had started a 3AM when got up to walk her dog. Was worse with bending over.  They did do a chest CT to rule out PE.  Right middle lobe density, most compatible with partial collapse in the right middle lobe. Follow-up chest CT in 6 months is suggested to assure stability.  Liver enzymes were elevated as well. Does have a hx of GERD, used to be on a PPI.  She has had voice hoarseness on and off.  Currently taking OTC prevacid 15mg  over the counter, but says still having breakthrough reflux sxs.   Review of Systems     Objective:   Physical Exam  Constitutional: She is oriented to person, place, and time. She appears well-developed and well-nourished.  HENT:  Head: Normocephalic and atraumatic.  Right Ear: External ear normal.  Left Ear: External ear normal.  Nose: Nose normal.  Mouth/Throat: Oropharynx is clear and moist.       TMs and canals are clear.   Eyes: Conjunctivae normal and EOM are normal. Pupils are equal, round, and reactive to light.  Neck: Neck supple. No thyromegaly present.  Cardiovascular: Normal rate, regular rhythm and normal heart sounds.   Pulmonary/Chest: Effort normal and breath sounds normal. She has no wheezes.  Lymphadenopathy:    She has no cervical adenopathy.  Neurological: She is alert and oriented to person, place, and time.  Skin: Skin is warm and dry.  Psychiatric: She has a normal mood and affect.          Assessment & Plan:  Asthma - Using her Advair BID.  Her lung exam is better today. NO wheezing on exam. In the yellow zone today.  Asked her to take 3 puffs on her albuterol here in office. Repeat peak flow came up to 300.  Encouraged her to be liberal with her abuterol for the next couple of day to keep peak flow in the green zone.   Does follow with her allergist.    CT abnormality  - F/u CT in 6 months to f/u right lung middle lobe lung collapse .  Elevated liver enzymes - will recheck today.    Hoarseness - I think most likely GERD related.

## 2012-02-16 ENCOUNTER — Telehealth: Payer: Self-pay | Admitting: *Deleted

## 2012-02-16 LAB — COMPLETE METABOLIC PANEL WITH GFR
ALT: 33 U/L (ref 0–35)
AST: 31 U/L (ref 0–37)
Albumin: 4.2 g/dL (ref 3.5–5.2)
Alkaline Phosphatase: 108 U/L (ref 39–117)
GFR, Est Non African American: 63 mL/min
Glucose, Bld: 83 mg/dL (ref 70–99)
Potassium: 4.3 mEq/L (ref 3.5–5.3)
Sodium: 142 mEq/L (ref 135–145)
Total Protein: 6.2 g/dL (ref 6.0–8.3)

## 2012-02-16 NOTE — Telephone Encounter (Signed)
PATIENT NOTIFIED OF LABS

## 2012-02-17 ENCOUNTER — Other Ambulatory Visit (HOSPITAL_COMMUNITY): Payer: Self-pay | Admitting: Psychiatry

## 2012-02-24 ENCOUNTER — Ambulatory Visit (INDEPENDENT_AMBULATORY_CARE_PROVIDER_SITE_OTHER): Payer: PRIVATE HEALTH INSURANCE | Admitting: Licensed Clinical Social Worker

## 2012-02-24 DIAGNOSIS — F411 Generalized anxiety disorder: Secondary | ICD-10-CM

## 2012-02-25 ENCOUNTER — Encounter: Payer: Self-pay | Admitting: Physician Assistant

## 2012-02-25 ENCOUNTER — Other Ambulatory Visit: Payer: Self-pay | Admitting: Family Medicine

## 2012-02-25 ENCOUNTER — Ambulatory Visit (INDEPENDENT_AMBULATORY_CARE_PROVIDER_SITE_OTHER): Payer: PRIVATE HEALTH INSURANCE | Admitting: Physician Assistant

## 2012-02-25 VITALS — BP 118/60 | HR 113 | Temp 98.9°F

## 2012-02-25 DIAGNOSIS — R6883 Chills (without fever): Secondary | ICD-10-CM

## 2012-02-25 DIAGNOSIS — J45901 Unspecified asthma with (acute) exacerbation: Secondary | ICD-10-CM

## 2012-02-25 DIAGNOSIS — R52 Pain, unspecified: Secondary | ICD-10-CM

## 2012-02-25 MED ORDER — PREDNISONE 20 MG PO TABS: ORAL_TABLET | ORAL | Status: DC

## 2012-02-25 MED ORDER — AZITHROMYCIN 250 MG PO TABS: ORAL_TABLET | ORAL | Status: DC

## 2012-02-25 MED ORDER — AMBULATORY NON FORMULARY MEDICATION: Status: DC

## 2012-02-25 MED ORDER — ALBUTEROL SULFATE HFA 108 (90 BASE) MCG/ACT IN AERS: 2.0000 | INHALATION_SPRAY | Freq: Four times a day (QID) | RESPIRATORY_TRACT | Status: DC | PRN

## 2012-02-25 NOTE — Progress Notes (Signed)
  Subjective:    Patient ID: Robin Conrad, female    DOB: 10-14-1950, 61 y.o.   MRN: [id number]  HPI Patient presents to the clinic with flu-like symptoms and difficulty breathing. She has a history of asthma. She uses her albuterol inhaler but feels she is not able to take a deep enough breath and not getting all of medication. Would like for me to call in spacer to help with that. This morning she work up with chills, muscle aches and SOB. She denies any fever or N/V/D. Has peak flow meter at home and running around 300. Cough is very dry. Tried Robotussin and has worked some. Has had her flu shot. She recently was switched to symbicort b/c insurance will not cover Advair. She has tried her husbands spiriva and does feel like it is helping.    Review of Systems     Objective:   Physical Exam  Constitutional: She is oriented to person, place, and time. She appears well-developed and well-nourished.  HENT:  Head: Normocephalic and atraumatic.  Right Ear: External ear normal.  Left Ear: External ear normal.  Nose: Nose normal.  Mouth/Throat: Oropharynx is clear and moist.       TM's clear bilaterally.  Eyes: Conjunctivae normal are normal.  Neck: Normal range of motion. Neck supple.  Cardiovascular: Normal rate, regular rhythm and normal heart sounds.   Pulmonary/Chest:       Decreased effort. Wheezing heard bilaterally more left than right. No rhonchi.  Lymphadenopathy:    She has no cervical adenopathy.  Neurological: She is alert and oriented to person, place, and time.  Skin: Skin is warm and dry.  Psychiatric: She has a normal mood and affect. Her behavior is normal.          Assessment & Plan:  Body aches/Chills/Asthma exacerbation-Influenza is negative. Peakflow in the yellow zone. Did not do nebulizer in office since patient has neb at home. She was encouraged to take 2 puffs of albuterol as soon as possible. Did give prednisone for 5 days. Encourage to use albuterol inhaler  and order new spacer to make sure getting full dose at least twice a day for 1 weeks. If feeling better only use as needed. It is the weekend did give zpak if continues to worsen or spikes a fever. Patient is to follow up if still feeling SOB after 2 weeks may need to consider adding something to current asthma treatment plan. Instructed to continue with symbicort daily.

## 2012-02-25 NOTE — Patient Instructions (Addendum)
Gave Zpak to only use if worsening and not better.    Albuterol twice a day for 1 week then as needed. Prednisone for 5 days. Follow up if after still feeling out breath.

## 2012-02-29 ENCOUNTER — Telehealth: Payer: Self-pay | Admitting: *Deleted

## 2012-02-29 MED ORDER — LEVOFLOXACIN 500 MG PO TABS: 500.0000 mg | ORAL_TABLET | Freq: Every day | ORAL | Status: AC

## 2012-02-29 NOTE — Telephone Encounter (Signed)
Ok, will send over rx for levaquin.  Call if not some better by Friday.

## 2012-02-29 NOTE — Telephone Encounter (Signed)
Pt states is on the Zpak and has 2 days left

## 2012-02-29 NOTE — Telephone Encounter (Signed)
Yes she has

## 2012-02-29 NOTE — Telephone Encounter (Signed)
Also states she is using her asthma meds a s directed and Robitussin DM with no relief

## 2012-02-29 NOTE — Telephone Encounter (Signed)
Pt seen last week for chills, cough, no fever. Given Zpak by Lesly Rubenstein to take if developed fever which she has not. Still has the terrible productive cough with yellow phlegm. Request something to help it go away as she states she has a grand-daughter in [place] as a preemie and has to see her.

## 2012-02-29 NOTE — Telephone Encounter (Signed)
Has she started teh zpack? If not then go ahead and start it since not better.  If she has completed it and still not bettet then let me know.

## 2012-02-29 NOTE — Telephone Encounter (Signed)
Has she taken the prednisone as well?

## 2012-03-01 NOTE — Telephone Encounter (Signed)
Pt.notified

## 2012-03-01 NOTE — Progress Notes (Signed)
   THERAPIST PROGRESS NOTE  Session Time: 9:10 -:00  Participation Level: Active  Behavioral Response: Casual and NeatAlertAnxious  Type of Therapy: Individual Therapy  Treatment Goals addressed: Anxiety  Interventions: Motivational Interviewing and Supportive  Summary: Nataliyah Packham is a 61 y.o. female who presents with anxiety   [place] continues to be concerned about husband's stress at work.  He has lost 3 clergy. The big news is that her daughter had her baby - very early   She was only 2lbs 6oz.  In incubator - struggle to bond with the baby since she is in incubator.  Louna will be taking care of baby when she comes home and Sharyl Nimrod has to go back to work.  She will not be able to be cared for in any day care situation.  This will be changing her life but is looking forward to it.  She is taking care of [name] now - she is a more challenging child.  The new baby is Willeen Niece.  Not only will her daughter's life be changed but so will Poppi's  Suicidal/Homicidal: Nowithout intent/plan  Plan: Return again in 2 weeks.  Diagnosis: Axis I: Generalized Anxiety Disorder    Axis II: Deferred    Hillard Goodwine,JUDITH A, LCSW 03/01/2012

## 2012-03-09 ENCOUNTER — Ambulatory Visit (HOSPITAL_COMMUNITY): Payer: Self-pay | Admitting: Licensed Clinical Social Worker

## 2012-03-13 ENCOUNTER — Other Ambulatory Visit (HOSPITAL_COMMUNITY): Payer: Self-pay | Admitting: Psychiatry

## 2012-03-13 ENCOUNTER — Other Ambulatory Visit: Payer: Self-pay | Admitting: Family Medicine

## 2012-03-20 ENCOUNTER — Other Ambulatory Visit (HOSPITAL_COMMUNITY): Payer: Self-pay | Admitting: Psychiatry

## 2012-03-23 ENCOUNTER — Ambulatory Visit (INDEPENDENT_AMBULATORY_CARE_PROVIDER_SITE_OTHER): Payer: PRIVATE HEALTH INSURANCE | Admitting: Licensed Clinical Social Worker

## 2012-03-23 DIAGNOSIS — F411 Generalized anxiety disorder: Secondary | ICD-10-CM

## 2012-03-27 NOTE — Progress Notes (Signed)
   THERAPIST PROGRESS NOTE  Session Time: 10:00 - 10:50  Participation Level: Active  Behavioral Response: NeatAlertAngry  Type of Therapy: Individual Therapy  Treatment Goals addressed: Anxiety  Interventions: Motivational Interviewing and Supportive  Summary: Frona Yost is a 62 y.o. 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  She is concerned about the health of her husband for he is under stress and does not like working in [employer].  She feel badly that he has this assignment as his last before retire. He will be retiring in about 2 years.  Not sure what they are going to do with the house they own.  That their daughter and family live in now.  They ;m;ay decide to buy a condo so that they wont have yard work and such also her daughter many need stay in the house for they financially may not be able to afford anything else.  They pay 700 dollars and Scheryl and her husband are not able to cover the mortgage with that amount.  Actually she and her husband are too progressive for this church.  He has also lost more ministers so he is having to cover a lot more.   .   Suicidal/Homicidal: Nowithout intent/plan  Therapist Response: Kathlene is getting stronger wiihtin herself and is more confident about how she sees the world.  Plan: Return again in 2 weeks.  Diagnosis: Axis I: Generalized Anxiety Disorder    Axis II: Deferred    Netta Fodge,JUDITH A, LCSW 03/27/2012

## 2012-03-30 ENCOUNTER — Other Ambulatory Visit (HOSPITAL_COMMUNITY): Payer: Self-pay | Admitting: Psychiatry

## 2012-04-06 ENCOUNTER — Ambulatory Visit (INDEPENDENT_AMBULATORY_CARE_PROVIDER_SITE_OTHER): Payer: PRIVATE HEALTH INSURANCE | Admitting: Licensed Clinical Social Worker

## 2012-04-06 DIAGNOSIS — F329 Major depressive disorder, single episode, unspecified: Secondary | ICD-10-CM

## 2012-04-06 NOTE — Progress Notes (Signed)
   THERAPIST PROGRESS NOTE  Session Time: 11:00 - 12:00  Participation Level: Active  Behavioral Response: NeatAlertEuthymic  Type of Therapy: Individual Therapy  Treatment Goals addressed: Anxiety  Interventions: Motivational Interviewing and Supportive  Summary: Robin Conrad is a 62 y.o. female who presents with anxiety.  She is mainly concerned about her new grandchild Robin Conrad or Robin Conrad - she was 3 months premature and she is still in the hospital and she can go home as soon as she learns that she can breath while feeding.  She had surgery on her head - s shunt was put in for hydrocephalus.  She is doing well with that.  Robin Conrad will be taking care of her full time for about a year - when they may start to use day care.  She is a little nervous about that.  She knows she will be more tired which will trigger some problems with fibromyalgia.  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  . She feels she may have to ask [name] if she can use a 3rd Xanax - she has to stay calm with this baby.  Reassured her that she knew about babies - as long as she had a person she could call with questions.  She is concerned about her asthma - having more problems with it recently.  She believes she needs to get a pulmonologist to treat her asthma for the allergist is not much help.  Dr. Linford Conrad has done more than the allergist.  She also would like to have all of her doctors under [facility] for they get all the records.  She continues to express concern about her husband and the stress he feels  In his job.  She feels better because her husband expressed how he does worry about her health and he does not say much because he cannot fix it.  Suicidal/Homicidal: No  Therapist Response: She pretty much talked non stop which is always a sign of her increased anxiety.  Plan: Return again in 2 weeks.  Diagnosis: Axis I: Depressive Disorder NOS    Axis II: Deferred    Robin Conrad,JUDITH A, LCSW 04/06/2012

## 2012-04-13 ENCOUNTER — Encounter: Payer: Self-pay | Admitting: Family Medicine

## 2012-04-13 ENCOUNTER — Other Ambulatory Visit: Payer: Self-pay | Admitting: *Deleted

## 2012-04-13 ENCOUNTER — Ambulatory Visit (INDEPENDENT_AMBULATORY_CARE_PROVIDER_SITE_OTHER): Payer: PRIVATE HEALTH INSURANCE | Admitting: Family Medicine

## 2012-04-13 VITALS — BP 122/61 | HR 95 | Ht 66.0 in | Wt 129.0 lb

## 2012-04-13 DIAGNOSIS — J209 Acute bronchitis, unspecified: Secondary | ICD-10-CM

## 2012-04-13 DIAGNOSIS — J45909 Unspecified asthma, uncomplicated: Secondary | ICD-10-CM

## 2012-04-13 DIAGNOSIS — J019 Acute sinusitis, unspecified: Secondary | ICD-10-CM

## 2012-04-13 MED ORDER — BENZONATATE 200 MG PO CAPS: 200.0000 mg | ORAL_CAPSULE | Freq: Three times a day (TID) | ORAL | Status: DC | PRN

## 2012-04-13 MED ORDER — BUDESONIDE-FORMOTEROL FUMARATE 160-4.5 MCG/ACT IN AERO: 2.0000 | INHALATION_SPRAY | Freq: Two times a day (BID) | RESPIRATORY_TRACT | Status: DC

## 2012-04-13 MED ORDER — TRAMADOL HCL 50 MG PO TABS: 50.0000 mg | ORAL_TABLET | Freq: Three times a day (TID) | ORAL | Status: DC | PRN

## 2012-04-13 NOTE — Progress Notes (Signed)
  Subjective:    Patient ID: Robin Conrad, female    DOB: 1951/01/09, 62 y.o.   MRN: [id number]  HPI Cough started about 5 days.  Says initially thought it was a cold bc had nasal congestion as well. She has been taking robitussin cold.  Had had productive cough No fever. Marland Kitchen  Hx of asthma. Peak flows 250 to 380 over the last couple weeks. Her peak flows have been over 340 for the last 3 days.. Says her nasal congestion is a little better but not gone.  Says cough is still there and strong.  Says she is using Symbicort and using a spacer.  She feels like sometime inhalers run out too quickly.  Has had to use her abluterol a little more in the last couple of days.  No GI upset.  .    She feels like being bounced back and forth bt her allergist and ENT for her cough and feels she is not reallyh getting anywhere. She requests to see a pulmonologist.    Fibromyalgia- doing well overall. Feels like at her baseline.  She is worried that since she will be taking care of her preemie grandbaby that it may flare her fibro. Sleep is fair, thought recent cold sxs have disturbed her sleep.     Review of Systems     Objective:   Physical Exam  Constitutional: She is oriented to person, place, and time. She appears well-developed and well-nourished.  HENT:  Head: Normocephalic and atraumatic.  Right Ear: External ear normal.  Left Ear: External ear normal.  Nose: Nose normal.  Mouth/Throat: Oropharynx is clear and moist.       TMs and canals are clear.   Eyes: Conjunctivae normal and EOM are normal. Pupils are equal, round, and reactive to light.  Neck: Neck supple. No thyromegaly present.  Cardiovascular: Normal rate, regular rhythm and normal heart sounds.   Pulmonary/Chest: Effort normal and breath sounds normal. She has no wheezes.  Lymphadenopathy:    She has no cervical adenopathy.  Neurological: She is alert and oriented to person, place, and time.  Skin: Skin is warm and dry.  Psychiatric: She  has a normal mood and affect.          Assessment & Plan:  Acute bronchitis - Likely viral.  Given sample of symbicort. Continue symptomatic care. Call if not feeling like she is improving by Monday. I reassured her that we can certainly work on cough. Will call in her prescription for Tessalon Perles. Explained that some coughing is good.  Acute sinusitis - Likely viral as her nasal symptoms are getting better, though not quite resolved.    Asthma - Well controlled overall. Her peak flows last 2 days and today in the green zone based on her age/height.  Don't have her personal best to go off of. Gave her ressuranc.e Gave her sample of symbicort.  Use albuterol liberlly if peak flow is below greenzone. Will be happy to refer her to pulm for further tx.  Fibromyalgia - Well controlled currently.

## 2012-04-13 NOTE — Patient Instructions (Signed)

## 2012-04-24 ENCOUNTER — Other Ambulatory Visit: Payer: Self-pay | Admitting: Critical Care Medicine

## 2012-04-24 ENCOUNTER — Encounter: Payer: Self-pay | Admitting: Critical Care Medicine

## 2012-04-24 ENCOUNTER — Ambulatory Visit (INDEPENDENT_AMBULATORY_CARE_PROVIDER_SITE_OTHER): Payer: PRIVATE HEALTH INSURANCE | Admitting: Critical Care Medicine

## 2012-04-24 VITALS — BP 130/78 | HR 103 | Temp 98.4°F | Ht 66.0 in | Wt 131.0 lb

## 2012-04-24 DIAGNOSIS — J4540 Moderate persistent asthma, uncomplicated: Secondary | ICD-10-CM | POA: Insufficient documentation

## 2012-04-24 DIAGNOSIS — K219 Gastro-esophageal reflux disease without esophagitis: Secondary | ICD-10-CM

## 2012-04-24 DIAGNOSIS — J45909 Unspecified asthma, uncomplicated: Secondary | ICD-10-CM

## 2012-04-24 DIAGNOSIS — J45901 Unspecified asthma with (acute) exacerbation: Secondary | ICD-10-CM

## 2012-04-24 MED ORDER — LANSOPRAZOLE 30 MG PO CPDR: 30.0000 mg | DELAYED_RELEASE_CAPSULE | Freq: Two times a day (BID) | ORAL | Status: DC

## 2012-04-24 MED ORDER — CHLORPHENIRAMINE TANNATE 12 MG PO TABS: 12.0000 mg | ORAL_TABLET | Freq: Every day | ORAL | Status: DC

## 2012-04-24 NOTE — Assessment & Plan Note (Signed)
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
## 2012-04-24 NOTE — Patient Instructions (Addendum)
Allergy lab to assess for possible Xolair Start Chlorpheniramine 12mg  at bedtime Stop zyrtec Symbicort two puff twice daily no change Stay on veramyst Follow strict reflux diet Increase prevacid to twice daily Return 2 months

## 2012-04-25 LAB — ALLERGY FULL PROFILE
Allergen,Goose feathers, e70: <0.1 kU/L
Alternaria Alternata: <0.1 kU/L
Aspergillus fumigatus, m3: <0.1 kU/L
Bermuda Grass: <0.1 kU/L
Candida Albicans: <0.1 kU/L
Cat Dander: <0.1 kU/L
Common Ragweed: <0.1 kU/L
D. farinae: <0.1 kU/L
Dog Dander: <0.1 kU/L
Helminthosporium halodes: <0.1 kU/L
House Dust Hollister: <0.1 kU/L
IgE (Immunoglobulin E), Serum: 4.7 IU/mL (ref 0.0–180.0)
Lamb's Quarters: <0.1 kU/L
Plantain: <0.1 kU/L
Stemphylium Botryosum: <0.1 kU/L

## 2012-04-26 NOTE — Progress Notes (Signed)
Quick Note:  Called, spoke with pt. Informed her of allergy test results per PW. She verbalized understanding. ______

## 2012-04-27 ENCOUNTER — Ambulatory Visit (HOSPITAL_COMMUNITY): Payer: Self-pay | Admitting: Licensed Clinical Social Worker

## 2012-05-04 ENCOUNTER — Ambulatory Visit (INDEPENDENT_AMBULATORY_CARE_PROVIDER_SITE_OTHER): Payer: PRIVATE HEALTH INSURANCE | Admitting: Psychiatry

## 2012-05-04 ENCOUNTER — Encounter (HOSPITAL_COMMUNITY): Payer: Self-pay | Admitting: Psychiatry

## 2012-05-04 VITALS — BP 96/65 | Ht 66.0 in | Wt 131.0 lb

## 2012-05-04 DIAGNOSIS — F329 Major depressive disorder, single episode, unspecified: Secondary | ICD-10-CM

## 2012-05-04 DIAGNOSIS — F411 Generalized anxiety disorder: Secondary | ICD-10-CM

## 2012-05-04 DIAGNOSIS — F331 Major depressive disorder, recurrent, moderate: Secondary | ICD-10-CM

## 2012-05-04 MED ORDER — ALPRAZOLAM 0.25 MG PO TABS: 0.2500 mg | ORAL_TABLET | Freq: Three times a day (TID) | ORAL | Status: DC | PRN

## 2012-05-04 NOTE — Progress Notes (Signed)
   [facility] Follow-up Outpatient Visit  [facility] [birth year]   Subjective: The patient is a 61 year old female who was been followed by [facility] since October of 2011. She is currently diagnosed with generalized anxiety disorder and Maj. depressive disorder. At her last appointment, I did not make any changes. She presents today. She has had more anxiety at home. She has a new grandbaby was 3 months at early. She will be taking care of her. The patient feels constantly anxious. She and her husband are undergoing a marriage class. It's bringing up a history of bad memories. She does not want to confront her husband are in the class. Her granddaughter spent 2 months in the neonatal intensive care unit. 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 She is having a hernia repair. The patient continues to have issues with constipation versus diarrhea. She feels that she rushes and is hyperverbal when her anxiety is worse. This is going on today it during the appointment. The patient endorses good sleep and appetite. She feels a third Xanax as needed will be very helpful.  Filed Vitals:   05/04/12 1047  BP: 96/65    Mental Status Examination  Appearance: Casually dressed Alert: Yes Attention: good  Cooperative: Yes Eye Contact: Good Speech: Regular rate rhythm and volume, nonpressured Psychomotor Activity: Normal Memory/Concentration: Intact Oriented: person, place, time/date and situation Mood: Euthymic Affect: Full Range Thought Processes and Associations: Logical Fund of Knowledge: Fair Thought Content: No suicidal or homicidal thoughts Insight: Fair Judgement: Fair  Diagnosis: Generalized anxiety disorder, major depressive disorder  Treatment Plan: I will increase Xanax to 3 times a day as needed. I will continue the  Wellbutrin XL, Celexa, and Ambien. I will see the patient back in 3 months.  Jamse Mead, MD

## 2012-05-15 ENCOUNTER — Telehealth: Payer: Self-pay | Admitting: Critical Care Medicine

## 2012-05-15 MED ORDER — LANSOPRAZOLE 30 MG PO CPDR: 30.0000 mg | DELAYED_RELEASE_CAPSULE | Freq: Two times a day (BID) | ORAL | Status: DC

## 2012-05-15 NOTE — Telephone Encounter (Signed)
Refill of the prevacid has been sent to the pharmacy per pts request. i called lmom to make the pt aware. Nothing further is needed.

## 2012-05-25 ENCOUNTER — Other Ambulatory Visit: Payer: Self-pay | Admitting: Family Medicine

## 2012-05-26 ENCOUNTER — Ambulatory Visit (INDEPENDENT_AMBULATORY_CARE_PROVIDER_SITE_OTHER): Payer: PRIVATE HEALTH INSURANCE | Admitting: Licensed Clinical Social Worker

## 2012-05-26 DIAGNOSIS — F411 Generalized anxiety disorder: Secondary | ICD-10-CM

## 2012-05-29 NOTE — Progress Notes (Signed)
   THERAPIST PROGRESS NOTE  Session Time: 10:00 - 11:00  Participation Level: Active  Behavioral Response: CasualAlertEuthymic  Type of Therapy: Individual Therapy  Treatment Goals addressed: Anxiety  Interventions: Motivational Interviewing and Supportive  Summary: Alailah Safley is a 62 y.o. female who presents with lots of concerns for her new grand daughter.  She was premature and has a shunt in her head because of swelling.  She is doing a lot to help her daughter and when daughter goes back to work she will be taking care of the baby.  She is only concerned is if her pain starts but she figures she will do the best she can.  The baby is adorable. She also talked how difficult things are for her husband.  This is a church that is dying and a hard last assignment to have.  She cannot believe some of the attitudes the members have.  She does a bible study and she will be glad when it is over.  She feels badly for her husband.  Not had much time for her art lately.  She does not want to have so much time go by before another appointment. She knows she is doing a lot better but she knows her anxiety will be higher after her daughter goes to work..   Suicidal/Homicidal: No  Therapist Response: Daleisa was in good spirits but did not stop talking which is the indicator of her anxieaty  Plan: Return again in 2 weeks.  Diagnosis: Axis I: Generalized Anxiety Disorder    Axis II: Deferred    Omeka Holben,JUDITH A, LCSW 05/29/2012

## 2012-06-07 ENCOUNTER — Ambulatory Visit (HOSPITAL_COMMUNITY): Payer: Self-pay | Admitting: Licensed Clinical Social Worker

## 2012-06-12 ENCOUNTER — Ambulatory Visit: Payer: Self-pay | Admitting: Family Medicine

## 2012-06-12 DIAGNOSIS — Z0289 Encounter for other administrative examinations: Secondary | ICD-10-CM

## 2012-06-13 ENCOUNTER — Other Ambulatory Visit: Payer: Self-pay | Admitting: Family Medicine

## 2012-06-16 ENCOUNTER — Other Ambulatory Visit: Payer: Self-pay | Admitting: Family Medicine

## 2012-06-19 ENCOUNTER — Ambulatory Visit (HOSPITAL_BASED_OUTPATIENT_CLINIC_OR_DEPARTMENT_OTHER)
Admission: RE | Admit: 2012-06-19 | Discharge: 2012-06-19 | Disposition: A | Discharge status: Home / Self Care | Payer: PRIVATE HEALTH INSURANCE | Source: Ambulatory Visit | Attending: Critical Care Medicine | Admitting: Critical Care Medicine

## 2012-06-19 ENCOUNTER — Encounter: Payer: Self-pay | Admitting: Critical Care Medicine

## 2012-06-19 ENCOUNTER — Ambulatory Visit (INDEPENDENT_AMBULATORY_CARE_PROVIDER_SITE_OTHER): Payer: PRIVATE HEALTH INSURANCE | Admitting: Critical Care Medicine

## 2012-06-19 VITALS — BP 126/68 | HR 94 | Temp 98.0°F

## 2012-06-19 DIAGNOSIS — J4540 Moderate persistent asthma, uncomplicated: Secondary | ICD-10-CM

## 2012-06-19 DIAGNOSIS — J45909 Unspecified asthma, uncomplicated: Secondary | ICD-10-CM | POA: Insufficient documentation

## 2012-06-19 DIAGNOSIS — J9819 Other pulmonary collapse: Secondary | ICD-10-CM

## 2012-06-19 DIAGNOSIS — R05 Cough: Secondary | ICD-10-CM | POA: Insufficient documentation

## 2012-06-19 DIAGNOSIS — E039 Hypothyroidism, unspecified: Secondary | ICD-10-CM | POA: Insufficient documentation

## 2012-06-19 DIAGNOSIS — R059 Cough, unspecified: Secondary | ICD-10-CM | POA: Insufficient documentation

## 2012-06-19 IMAGING — CT CT CHEST W/O CM
2 of 3 series · 15 of 36 positions shown, 18 images · non-contrast
Comparison: Chest CT [DATE].

CLINICAL DATA: Cough.  Right middle lobe collapse.

CT CHEST WITHOUT CONTRAST
TECHNIQUE: Multidetector CT imaging of the chest was performed
following the standard protocol without IV contrast.

[Series 2: chest 5.0 b31f · axial · 0.57mm/px · z∈[-309,-64]mm · 12 of 59 slices shown, 15 images]
[im 5/59  mediastinal]
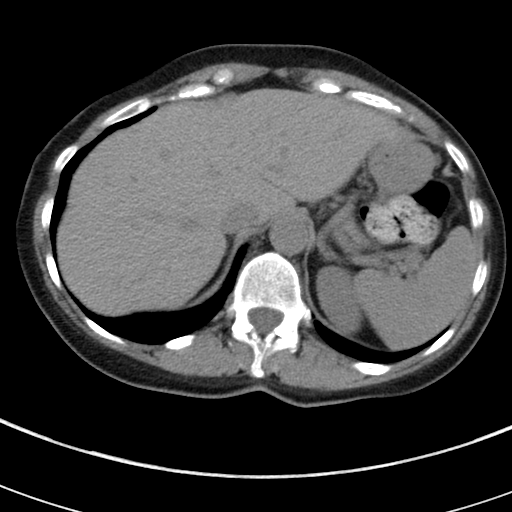
[im 5/59  lung]
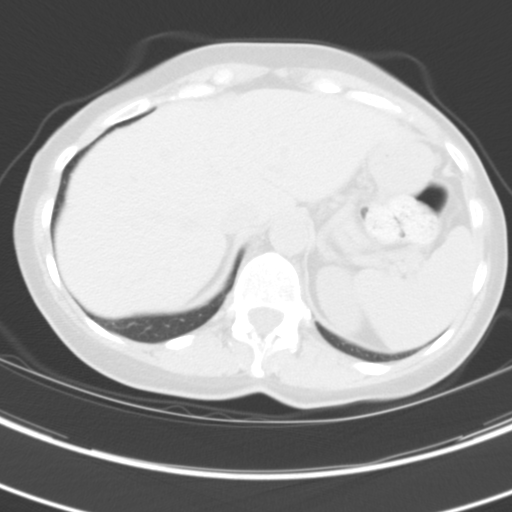
[im 9/59  lung]
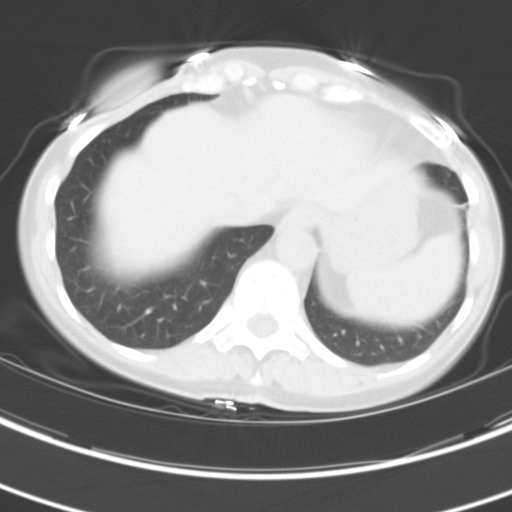
[im 13/59  lung]
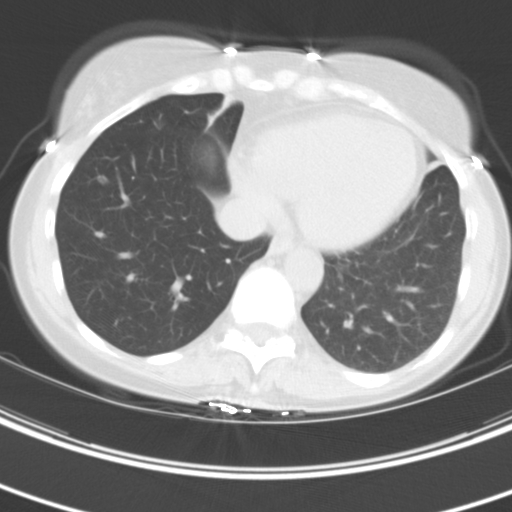
[im 18/59  lung]
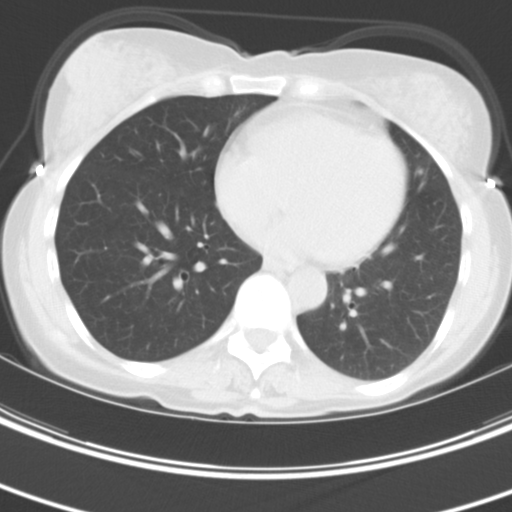
[im 22/59  mediastinal]
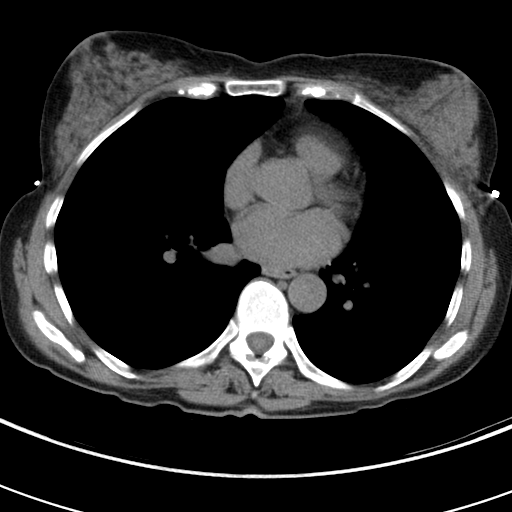
[im 22/59  lung]
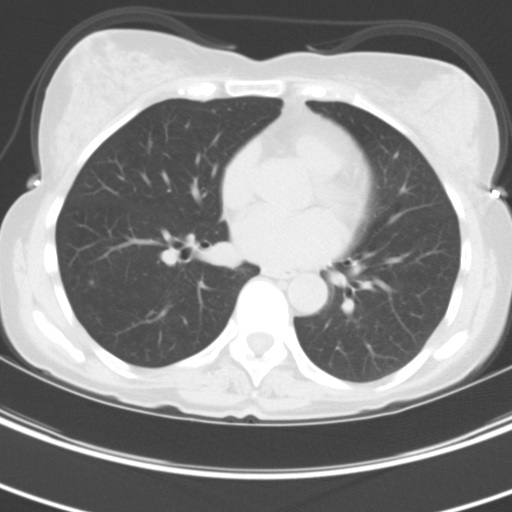
[im 26/59  lung]
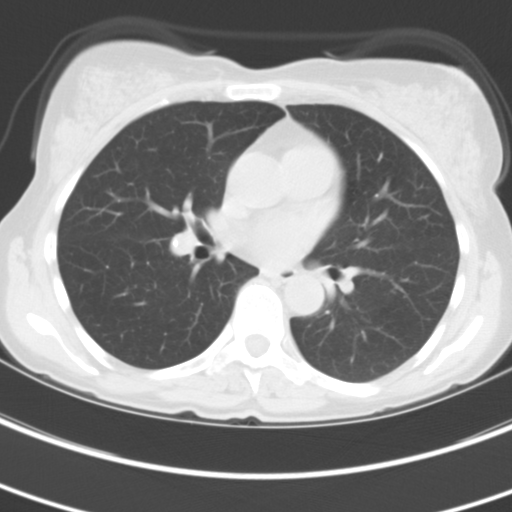
[im 33/59  lung]
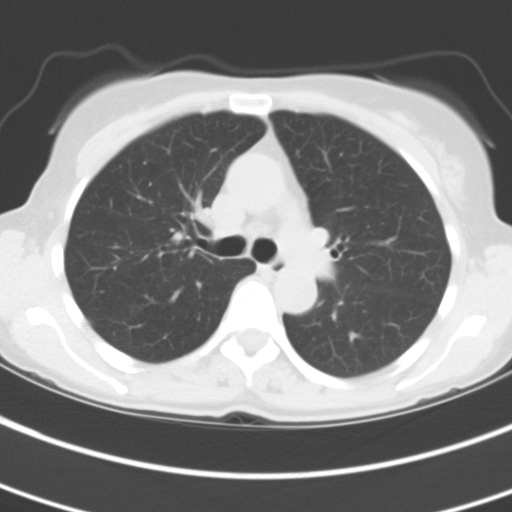
[im 37/59  lung]
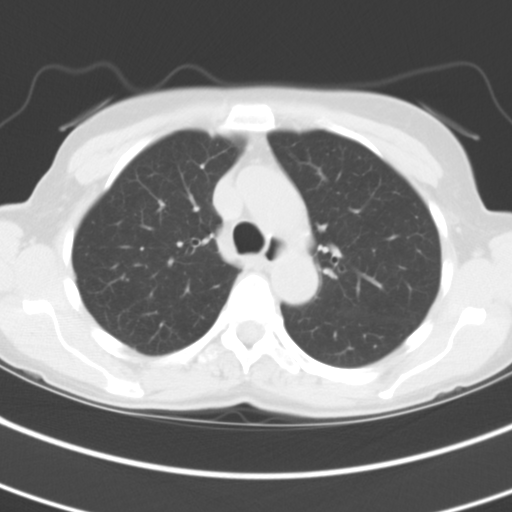
[im 41/59  mediastinal]
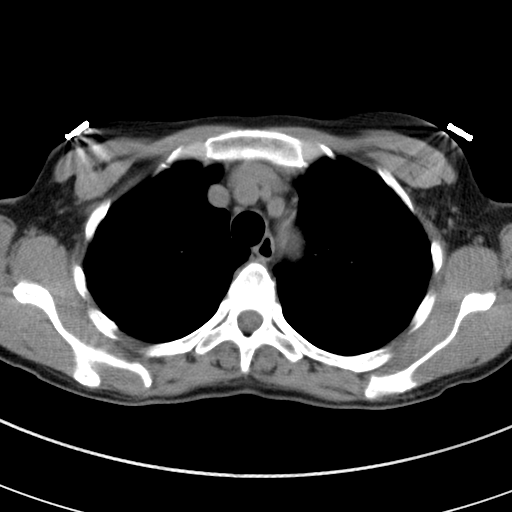
[im 41/59  lung]
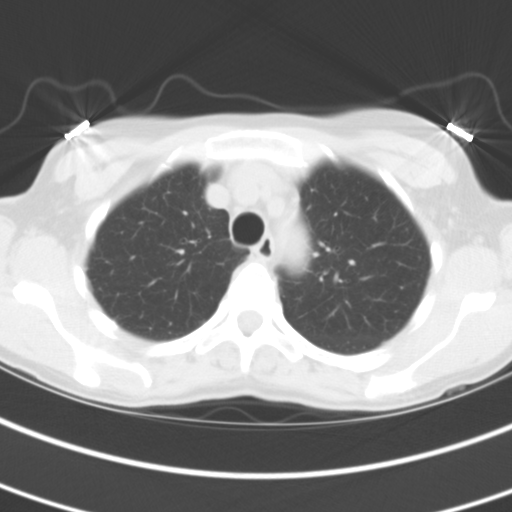
[im 46/59  lung]
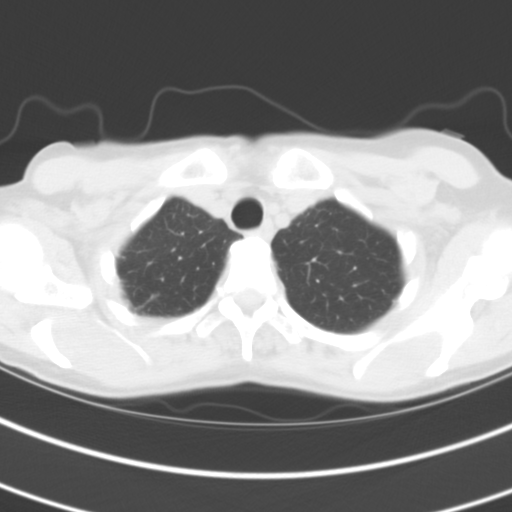
[im 50/59  lung]
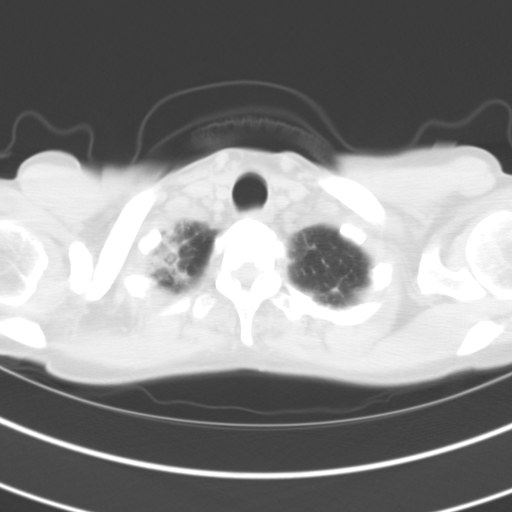
[im 54/59  lung]
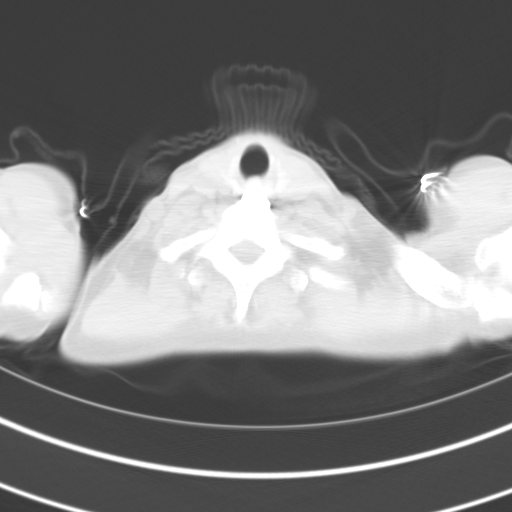

[Series 6: chest 3.0 coronal · coronal · 0.59mm/px · 3 of 67 slices shown]
[im 14/67  lung]
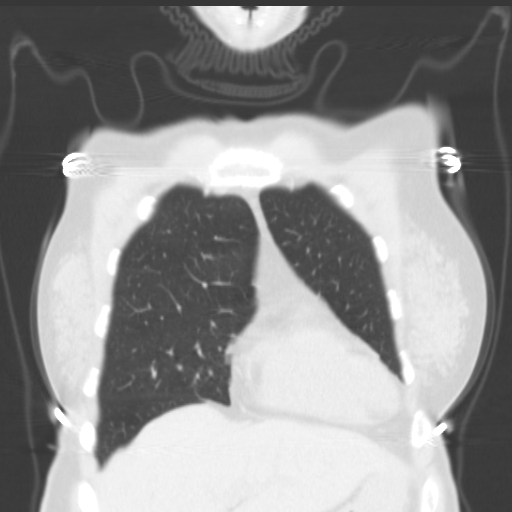
[im 27/67  lung]
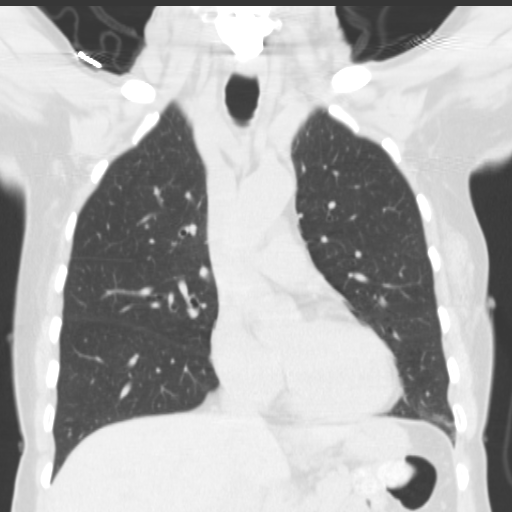
[im 40/67  lung]
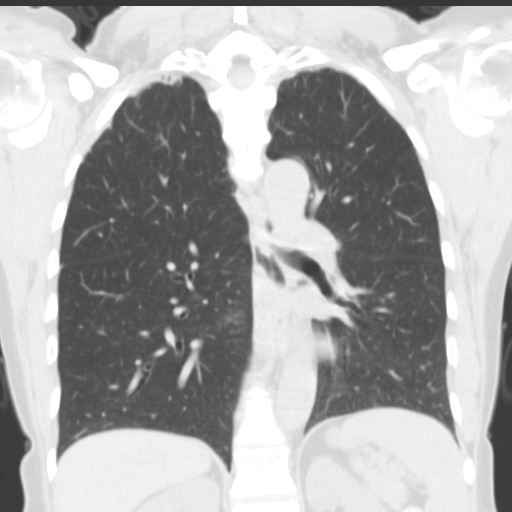

[15 of 36 positions shown; findings below may reference images not displayed]

FINDINGS: Mediastinum: Heart size is normal. There is no significant
pericardial fluid, thickening or pericardial calcification. No
pathologically enlarged mediastinal or hilar lymph nodes. Please
note that accurate exclusion of hilar adenopathy is limited on
noncontrast CT scans.  The esophagus is unremarkable in appearance.

Lungs/Pleura: There is a small amount of chronic scarring in the
medial segment of the right middle lobe, however, and this is
decreased compared to the prior study.  There is also a small
amount of scarring in the inferior segment of the lingula.  No
suspicious appearing pulmonary nodules or masses are identified on
today's examination.  No acute consolidative airspace disease.  No
pleural effusions.  The trachea and the main bronchi are widely
patent bilaterally.

Upper Abdomen: Unremarkable.

Musculoskeletal: There are no aggressive appearing lytic or blastic
lesions noted in the visualized portions of the skeleton.
Orthopedic fixation hardware in the lower cervical spine.
IMPRESSION: 1.  Small amount of scarring in the medial segment of the right
middle lobe and inferior segment of the lingula.
2.  No evidence of frank right lower lobe collapse.
3.  No acute findings in the thorax.

## 2012-06-19 NOTE — Assessment & Plan Note (Signed)
Moderate persistent asthma now improved Plan Maintain inhaled medications as prescribed

## 2012-06-19 NOTE — Patient Instructions (Addendum)
No change in medications. CT chest today  Return 4 months
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
## 2012-06-20 ENCOUNTER — Telehealth: Payer: Self-pay | Admitting: Critical Care Medicine

## 2012-06-20 ENCOUNTER — Ambulatory Visit (HOSPITAL_COMMUNITY): Payer: Self-pay | Admitting: Licensed Clinical Social Worker

## 2012-06-20 NOTE — Telephone Encounter (Signed)
Per SN and TP the pt needs to see her PCP for the CP Spoke with pt and notified of this and she verbalized understanding

## 2012-06-20 NOTE — Progress Notes (Signed)
Quick Note:  Notify the patient that the CT chest is improved The right middle lobe of the lung is better. It has reinflated. No change in medications are recommended. Continue current meds as prescribed at last office visit ______

## 2012-06-20 NOTE — Telephone Encounter (Signed)
I called to inform pt of CT Chest results and recs as stated below per Dr. Delford Field: Result Note    Notify the patient that the CT chest is improved The right middle lobe of the lung is better. It has reinflated.   No change in medications are recommended.   Continue current meds as prescribed at last office visit   -------  She verbalized understanding of this but has further concerns regarding chest pain she is experiencing today.  Pt states she has chest pain in the center of her chest today.  She noticed it this morning and it is continuing throughout the day.  Reports it is non radiating and is unsure on how to describe it (sharp, dull, stabbing, etc).  Also reports an increase in a nonprod cough this am.  Has used benzonate and albuterol hfa with some relief on cough along with maintenance inahlers.  She does have some SOB and is unsure if this is worse today or not.  Pt wanting to know if there is anything further she can take for the chest pain.  She is concerned, but not sure, that this may be due to her lung as she has had an increase in cough this am as well.  She is requesting further recs. As Dr. Delford Field is 11pm elink today, will route msg to doc of the day.  Dr. Kriste Basque, pls advise.  Thank you.  Last OV with Dr. Delford Field: June 19, 2012; asked to f/u in 4 months  [facility]  Allergies verified with pt: Allergies  Allergen Reactions  . Codeine     hyperactivity  . Cortizone-5 (Hydrocortisone Base)     hyperactivity  . Prednisone     Increases pain  Shot not oral.  . Latex Rash

## 2012-06-20 NOTE — Progress Notes (Signed)
Quick Note:  Called, spoke with pt. Informed her of CT Chest results and recs per Dr. She verbalized understanding of both. ______

## 2012-06-21 ENCOUNTER — Ambulatory Visit (INDEPENDENT_AMBULATORY_CARE_PROVIDER_SITE_OTHER): Payer: PRIVATE HEALTH INSURANCE | Admitting: Licensed Clinical Social Worker

## 2012-06-21 DIAGNOSIS — F411 Generalized anxiety disorder: Secondary | ICD-10-CM

## 2012-06-21 NOTE — Progress Notes (Signed)
   THERAPIST PROGRESS NOTE  Session Time: 1:10 -2:00  Participation Level: Active  Behavioral Response: Well GroomedAlertEuthymic  Type of Therapy: Individual Therapy  Treatment Goals addressed: Anxiety  Interventions: Motivational Interviewing and Supportive  Summary: Shalae Belmonte is a 62 y.o. female who presents with a rushed energy.  She is now taking care of the new baby and sometimes her sister too  She is enjoying the process but sometimes feels overwhelmed with what needs to be done.  The baby is doing well - easy to take care of - gaining weight nicely.  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  So far everything looks ok - they are still not sure of any brain damage.  Right now things are normal.  She showed pictures - she is a beautiful baby.  Solimar mainly discussed her frustration with the people in the church her husband has to minister to  He has two more years until retirement and she hopes he makes it for he is pretty stressed.  She feels the people in the church are unrealistic and are in denial about the decline of the church but are unwilling to make any changes.  She is very defensive and supportive of her husband and feels badly that his last church is going to be so unsatisfying.  When he retires they are supposed to move into a house they bought in the area.  Right now her daughter and family live there and gt a break on the ren.  They are to move out but they are having their own money problems.  Also the house needs a lof work before they move in.  Lots of renovation to make it livable for them.  She take about [place] pick that area to retire in because her daughter and grand children are here.   Suicidal/Homicidal: No  Therapist Response: She is handling the increased stress well  Plan: Return again in 2 weeks.  Diagnosis: Axis I: Anxiety Disorder  NOS    Axis II: Deferred    Colsen Modi,JUDITH A, LCSW 06/21/2012

## 2012-07-04 ENCOUNTER — Ambulatory Visit: Payer: Self-pay | Admitting: Family Medicine

## 2012-07-05 ENCOUNTER — Ambulatory Visit (INDEPENDENT_AMBULATORY_CARE_PROVIDER_SITE_OTHER): Payer: PRIVATE HEALTH INSURANCE | Admitting: Licensed Clinical Social Worker

## 2012-07-05 ENCOUNTER — Other Ambulatory Visit: Payer: Self-pay | Admitting: Family Medicine

## 2012-07-05 DIAGNOSIS — F411 Generalized anxiety disorder: Secondary | ICD-10-CM

## 2012-07-05 DIAGNOSIS — Z1231 Encounter for screening mammogram for malignant neoplasm of breast: Secondary | ICD-10-CM

## 2012-07-05 NOTE — Progress Notes (Signed)
   THERAPIST PROGRESS NOTE  Session Time: 11:10 - 12:00  Participation Level: Active  Behavioral Response: Well GroomedAlertEuthymic  Type of Therapy: Individual Therapy  Treatment Goals addressed: Anxiety  Interventions: Motivational Interviewing and Supportive  Summary: Antoine Fiallos is a 62 y.o. female who presents with problem with anxiety.  Today Ikeya brought in her grand daughter because the day she was usually free - Wed - got switched.  Adorable baby - her eyes are like Wilmon Pali stated her mother favors her and so does granddaughter.  She is enjoying the baby - she is on a feeding schedule - was starting to get fussy for close to feeding time.  She keeps pretty busy especially now that she cares for baby.  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  She is frustrated and annoyed with the rigid positions of members of the church.  They are not interested in being creative or making any changes.  Their view of things is quite narrow.  She is particularly concerned about her husband who has an impossible job being Education officer, environmental of a dying church whose membership does not want to see the reality and will not consider changes to draw membership.  She enjoys reading and has not had as much time to read lately - discussed some books she likes - she does belongs to a book club and there are only a couple of people who actually read the books - it is more of a social time for others.   She was talking a lot and hardly taking a breath.  .   Suicidal/Homicidal: No  Therapist Response: Yailene's level of anxiety can be evaluated by how much she talks.  This anxiety does not seem to be causing her difficulties - believe it is coming from her increased responsibility   Plan: Return again in 2 weeks.  Diagnosis: Axis I: Anxiety Disorder NOS    Axis II: Deferred    Elice Crigger,JUDITH A, LCSW 07/05/2012

## 2012-07-06 ENCOUNTER — Ambulatory Visit (HOSPITAL_COMMUNITY): Payer: Self-pay | Admitting: Psychiatry

## 2012-07-12 ENCOUNTER — Ambulatory Visit (INDEPENDENT_AMBULATORY_CARE_PROVIDER_SITE_OTHER): Payer: PRIVATE HEALTH INSURANCE | Admitting: Psychiatry

## 2012-07-12 ENCOUNTER — Encounter (HOSPITAL_COMMUNITY): Payer: Self-pay | Admitting: Psychiatry

## 2012-07-12 ENCOUNTER — Ambulatory Visit (INDEPENDENT_AMBULATORY_CARE_PROVIDER_SITE_OTHER): Payer: PRIVATE HEALTH INSURANCE | Admitting: Family Medicine

## 2012-07-12 ENCOUNTER — Encounter: Payer: Self-pay | Admitting: Family Medicine

## 2012-07-12 VITALS — BP 98/60 | Ht 66.0 in | Wt 130.0 lb

## 2012-07-12 VITALS — BP 110/61 | HR 110 | Ht 66.0 in | Wt 130.0 lb

## 2012-07-12 DIAGNOSIS — M19049 Primary osteoarthritis, unspecified hand: Secondary | ICD-10-CM

## 2012-07-12 DIAGNOSIS — J45909 Unspecified asthma, uncomplicated: Secondary | ICD-10-CM

## 2012-07-12 DIAGNOSIS — A084 Viral intestinal infection, unspecified: Secondary | ICD-10-CM

## 2012-07-12 DIAGNOSIS — IMO0001 Reserved for inherently not codable concepts without codable children: Secondary | ICD-10-CM

## 2012-07-12 DIAGNOSIS — M797 Fibromyalgia: Secondary | ICD-10-CM

## 2012-07-12 DIAGNOSIS — A088 Other specified intestinal infections: Secondary | ICD-10-CM

## 2012-07-12 DIAGNOSIS — J4540 Moderate persistent asthma, uncomplicated: Secondary | ICD-10-CM

## 2012-07-12 DIAGNOSIS — F331 Major depressive disorder, recurrent, moderate: Secondary | ICD-10-CM

## 2012-07-12 DIAGNOSIS — M19041 Primary osteoarthritis, right hand: Secondary | ICD-10-CM

## 2012-07-12 DIAGNOSIS — F329 Major depressive disorder, single episode, unspecified: Secondary | ICD-10-CM

## 2012-07-12 DIAGNOSIS — F411 Generalized anxiety disorder: Secondary | ICD-10-CM

## 2012-07-12 DIAGNOSIS — L821 Other seborrheic keratosis: Secondary | ICD-10-CM

## 2012-07-12 MED ORDER — BENZONATATE 200 MG PO CAPS: 200.0000 mg | ORAL_CAPSULE | Freq: Three times a day (TID) | ORAL | Status: DC | PRN
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
## 2012-07-12 NOTE — Progress Notes (Signed)
Subjective:    Patient ID: Robin Conrad, female    DOB: 1950/08/19, 62 y.o.   MRN: [id number]  HPI Fibromyalgia-Doing well overall. No recent flares. She has tried to be more active.  Has stomach bug yesterday with vomiting with stomach cramps.  Says feels better today. Her son recently had it.    Toenail abnormality - Says when the toenail grows out it tends to not grow back normally.    Asthma - Has raspy voice since high pollen count over the last 2 weeks.  Says hasn't been as strick with her GERD diet.  Doing well with her symbicort.  Would like a refill on her tessalon perles. She's been following with Dr. Luisa Hart right. She did have a CT done recently and got a good report back. She says that as far as her voice is concerned it has been much better. She's actually been able to seen. But she does admit she has a hard time sticking with a reflux diet but tries her best.  Spot on her back. She would ike to have very thin. She had that done several years ago his responded really well but has slowly grown back over the last few years. Has otherwise not bothersome but does occasionally catch on her tile when she's trying off. She says sometimes pieces of it to flake off. She also has a similar lesion on her upper back near her neck and one on her left anterior thigh.  She also has a lot of arthritis on her right hand her middle finger. She says the topical gels don't seem to be very helpful. She's tried to look for a wrist splint to help support that finger when it's more bothersome and says she hasn't been able to find one that does that. She says it gets exacerbated when she's taking the baby up a lot. She does take care of her grandchild. And also when she slipped and a baby carrier which is very heavy.   Review of Systems     Objective:   Physical Exam  Constitutional: She is oriented to person, place, and time. She appears well-developed and well-nourished.  HENT:  Head: Normocephalic and  atraumatic.  Cardiovascular: Normal rate, regular rhythm and normal heart sounds.   Pulmonary/Chest: Effort normal and breath sounds normal.  Musculoskeletal:  On her right hand, middle finger she does have an enlarged PIP joint. It is tender to palpation. Normal flexion and extension.  Neurological: She is alert and oriented to person, place, and time.  Skin: Skin is warm and dry.  She has a large approximately 2 cm seborrheic keratosis on her left low back. She has a smaller approximately 1 cm lesion on her upper back near her neck and a small approximately 1 cm lesion on her left anterior thigh.  Psychiatric: She has a normal mood and affect. Her behavior is normal.          Assessment & Plan:  Fibromyalgia- Stable. No major changes.   Followup with me in 3 months.  Gastroenteritis - resolved.  I'm so glad she is feeling much better. She's been able to keep food down today.  Asthma-currently well controlled even with spring and high amounts of pollen. She's using her Symbicort regularly. She has a followup with Dr. Delford Field in approximately 4 months.  Seborrheic keratosis - discussed tx optoins. She'll be great candidate for cryotherapy. Patient wanted to do this today. She would like all 3 lesions treated. Please see procedure note  below.  Osteoarthritis of finger-I gave her a finger splint to use as needed. I encouraged her not to use it too frequently if they can weaken the muscles around the joint but certainly if after taking care of the baby for the day she feels like she needs a little extra support and rest of the joint it would be reasonable to slipped a splint on.  Cryotherapy Procedure Note  Pre-operative Diagnosis: Seborrheic keratosis  Post-operative Diagnosis: same  Locations: low left bac, left ant thigh, and upper back near neck   Indications: irritated  Anesthesia: not required    Procedure Details  Patient informed of risks (permanent scarring, infection,  light or dark discoloration, bleeding, infection, weakness, numbness and recurrence of the lesion) and benefits of the procedure and verbal informed consent obtained.  The areas are treated with liquid nitrogen therapy, frozen until ice ball extended 2 mm beyond lesion, allowed to thaw, and treated again. The patient tolerated procedure well.  The patient was instructed on post-op care, warned that there may be blister formation, redness and pain. Recommend OTC analgesia as needed for pain.  Condition: Stable  Complications: none.  Plan: 1. Instructed to keep the area dry and covered for 24-48h and clean thereafter. 2. Warning signs of infection were reviewed.   3. Recommended that the patient use OTC acetaminophen as needed for pain.  4. Return prn.

## 2012-07-25 ENCOUNTER — Ambulatory Visit (INDEPENDENT_AMBULATORY_CARE_PROVIDER_SITE_OTHER): Payer: PRIVATE HEALTH INSURANCE

## 2012-07-25 DIAGNOSIS — Z1231 Encounter for screening mammogram for malignant neoplasm of breast: Secondary | ICD-10-CM

## 2012-08-01 ENCOUNTER — Ambulatory Visit (HOSPITAL_COMMUNITY): Payer: Self-pay | Admitting: Licensed Clinical Social Worker

## 2012-08-02 ENCOUNTER — Ambulatory Visit (INDEPENDENT_AMBULATORY_CARE_PROVIDER_SITE_OTHER): Payer: PRIVATE HEALTH INSURANCE | Admitting: Licensed Clinical Social Worker

## 2012-08-02 DIAGNOSIS — F411 Generalized anxiety disorder: Secondary | ICD-10-CM

## 2012-08-02 NOTE — Progress Notes (Signed)
   THERAPIST PROGRESS NOTE  Session Time: 10:00 - 10:50  Participation Level: Active  Behavioral Response: NeatAlertEuthymic  Type of Therapy: Individual Therapy  Treatment Goals addressed: Anxiety  Interventions: Motivational Interviewing and Supportive  Summary: Robin Conrad is a 62 y.o. female who presents with some fatigue today.  She admits that she is tired.  She spends a lot of time with the baby and sometimes it is [place] too - She enjoys it but it takes its toll. 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  They do not have the courtesy to call if they cannot make it.  One time she was the only one that showed.  She and her husband are starting look at renovating their house.  She wants to be sure her husband takes her when he brings over the man who may be doing the kitchen.  She has some definite ideas and she know her husband does not know them..  She also discussed the frustration she feel with doing her art piece for the baby's room - she finally gets it where she is happy with it only to make a mistake on the written part.  She is considering taking a class on doing oil painting and learning how to make pictures larger than 8x10.  She feels she is doing better and coming in once per month is probably enough right now.  .   Suicidal/Homicidal: No  Therapist Response: She was more relaxed today  Plan: Return again in 4 weeks.  Diagnosis: Axis I: Generalized Anxiety Disorder    Axis II: Deferred    Robin Conrad,JUDITH A, LCSW 08/02/2012

## 2012-08-03 ENCOUNTER — Telehealth: Payer: Self-pay | Admitting: *Deleted

## 2012-08-03 NOTE — Telephone Encounter (Signed)
Received Prior Auth request for lansoprazole from [facility]. PA request completed and faxed backt to [facility] at [phone]. Will hold msg in my box to f/u on.

## 2012-08-17 ENCOUNTER — Other Ambulatory Visit (HOSPITAL_COMMUNITY): Payer: Self-pay | Admitting: Psychiatry

## 2012-08-17 NOTE — Telephone Encounter (Signed)
Called [facility], spoke with Melissa V.  Was advised the lansoprazole 30 mg bid has been APPROVED from 07/13/2012 - 01/30/2013. Called [facility], Monica aware of approval. [email or web address] on pt's home and cell #s to inform her of approval.

## 2012-08-18 NOTE — Telephone Encounter (Signed)
Called, spoke with pt. Informed her of approval.  She verbalized understanding and voiced no further questions or concerns at this time.

## 2012-08-25 ENCOUNTER — Other Ambulatory Visit: Payer: Self-pay | Admitting: Family Medicine

## 2012-08-31 ENCOUNTER — Ambulatory Visit (HOSPITAL_COMMUNITY): Payer: Self-pay | Admitting: Licensed Clinical Social Worker

## 2012-09-07 ENCOUNTER — Ambulatory Visit (HOSPITAL_COMMUNITY): Payer: Self-pay | Admitting: Licensed Clinical Social Worker

## 2012-09-26 ENCOUNTER — Other Ambulatory Visit (HOSPITAL_COMMUNITY): Payer: Self-pay | Admitting: Psychiatry

## 2012-09-26 ENCOUNTER — Other Ambulatory Visit: Payer: Self-pay | Admitting: Family Medicine

## 2012-09-26 DIAGNOSIS — F331 Major depressive disorder, recurrent, moderate: Secondary | ICD-10-CM

## 2012-09-30 ENCOUNTER — Emergency Department (INDEPENDENT_AMBULATORY_CARE_PROVIDER_SITE_OTHER): Payer: PRIVATE HEALTH INSURANCE

## 2012-09-30 ENCOUNTER — Emergency Department
Admission: EM | Admit: 2012-09-30 | Discharge: 2012-09-30 | Disposition: A | Discharge status: Home / Self Care | Payer: PRIVATE HEALTH INSURANCE | Source: Home / Self Care | Attending: Family Medicine | Admitting: Family Medicine

## 2012-09-30 DIAGNOSIS — S99929A Unspecified injury of unspecified foot, initial encounter: Secondary | ICD-10-CM

## 2012-09-30 DIAGNOSIS — S92912A Unspecified fracture of left toe(s), initial encounter for closed fracture: Secondary | ICD-10-CM

## 2012-09-30 DIAGNOSIS — S8990XA Unspecified injury of unspecified lower leg, initial encounter: Secondary | ICD-10-CM

## 2012-09-30 DIAGNOSIS — S99922A Unspecified injury of left foot, initial encounter: Secondary | ICD-10-CM

## 2012-09-30 DIAGNOSIS — S92919A Unspecified fracture of unspecified toe(s), initial encounter for closed fracture: Secondary | ICD-10-CM

## 2012-09-30 DIAGNOSIS — W2203XA Walked into furniture, initial encounter: Secondary | ICD-10-CM

## 2012-09-30 MED ORDER — MELOXICAM 15 MG PO TABS: 15.0000 mg | ORAL_TABLET | Freq: Every day | ORAL | Status: DC

## 2012-09-30 NOTE — ED Notes (Signed)
Robin Conrad hit her 5 th toe on her left foot on her coffee table on Monday. Pain is throbbing and is a 8/10.
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
## 2012-10-04 ENCOUNTER — Ambulatory Visit (INDEPENDENT_AMBULATORY_CARE_PROVIDER_SITE_OTHER): Payer: PRIVATE HEALTH INSURANCE | Admitting: Licensed Clinical Social Worker

## 2012-10-04 DIAGNOSIS — F411 Generalized anxiety disorder: Secondary | ICD-10-CM

## 2012-10-04 NOTE — Progress Notes (Signed)
   THERAPIST PROGRESS NOTE  Session Time: 11:00 - 12:00  Participation Level: Active  Behavioral Response: NeatAlertAnxious and Euthymic  Type of Therapy: Individual Therapy  Treatment Goals addressed: Anxiety  Interventions: Motivational Interviewing, Solution Focused and Supportive  Summary: Adhya Cocco is a 62 y.o. female who presents with anxiety.  Miruiam comes in with her granddaughter - she is getting bigger and her carrier is getting harder to carry.  She is a good baby and she enjoys taking care of her.  She has to take care of [facility] also when she is not in day care.  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  Her expanded role as grandmother is a bit overwhelming at times.  She is glad she can help Sharyl Nimrod and she would like some of her free time back.  She is concerned about her husband for she feels he is depressed and he needs to do something about it.  He has been depressed before and taken antidepressants and they helped him.  She would like him to consider again.  She still goes to her book club and enjoys the reading.  She has not had time for her art work..   Suicidal/Homicidal: No  Therapist Response: She is doing well - she has positive focus  Plan: Return again in 2 weeks.  Diagnosis: Axis I: Generalized Anxiety Disorder    Axis II: Deferred    Shalyn Koral,JUDITH A, LCSW 10/04/2012

## 2012-10-05 ENCOUNTER — Ambulatory Visit (INDEPENDENT_AMBULATORY_CARE_PROVIDER_SITE_OTHER): Payer: PRIVATE HEALTH INSURANCE | Admitting: Sports Medicine

## 2012-10-05 VITALS — BP 120/64 | HR 91 | Wt 133.0 lb

## 2012-10-05 DIAGNOSIS — S92502A Displaced unspecified fracture of left lesser toe(s), initial encounter for closed fracture: Secondary | ICD-10-CM | POA: Insufficient documentation

## 2012-10-05 DIAGNOSIS — S92919A Unspecified fracture of unspecified toe(s), initial encounter for closed fracture: Secondary | ICD-10-CM

## 2012-10-05 MED ORDER — TRAMADOL HCL 50 MG PO TABS: 100.0000 mg | ORAL_TABLET | Freq: Three times a day (TID) | ORAL | Status: DC | PRN

## 2012-10-05 NOTE — Assessment & Plan Note (Signed)
There is a very minimally angulated fracture of the proximal phalanx of the left fifth toe. This will heal well. Continue postop shoe, Buddy taping, tramadol as needed for pain. Return in 3 weeks, x-ray before visit.  I billed a fracture code for this visit, all subsequent visits for this complaint will be "post-op checks" in the global period.

## 2012-10-05 NOTE — Progress Notes (Signed)
   Subjective:    I'm seeing this patient as a consultation for:  Dr. Orson Aloe  CC: Toe fracture  HPI: This is a very pleasant 62 year old female with a history of fibromyalgia who comes in after hitting her toe on something almost one week ago. She was seen in urgent care, buddy taped, and referred to me for further evaluation and treatment. Pain is localized at the toe, doesn't radiate, moderate to severe. She was given a postop shoe. Mobic for pain.  Past medical history, Surgical history, Family history not pertinant except as noted below, Social history, Allergies, and medications have been entered into the medical record, reviewed, and no changes needed.   Review of Systems: No headache, visual changes, nausea, vomiting, diarrhea, constipation, dizziness, abdominal pain, skin rash, fevers, chills, night sweats, weight loss, swollen lymph nodes, body aches, joint swelling, muscle aches, chest pain, shortness of breath, mood changes, visual or auditory hallucinations.   Objective:   General: Well Developed, well nourished, and in no acute distress.  Neuro/Psych: Alert and oriented x3, extra-ocular muscles intact, able to move all 4 extremities, sensation grossly intact. Skin: Warm and dry, no rashes noted.  Respiratory: Not using accessory muscles, speaking in full sentences, trachea midline.  Cardiovascular: Pulses palpable, no extremity edema. Abdomen: Does not appear distended. Left foot: There's tenderness to palpation as well as swelling of the fifth digit, pain is at the optimal phalanx, she is neurovascularly intact distally.  X-rays reviewed show a minimally volar angulated fracture of the proximal phalanx.  Toes were buddy taped.  Impression and Recommendations:   This case required medical decision making of moderate complexity.

## 2012-10-10 ENCOUNTER — Other Ambulatory Visit: Payer: Self-pay | Admitting: Family Medicine

## 2012-10-10 MED ORDER — GABAPENTIN 400 MG PO CAPS: ORAL_CAPSULE | ORAL | Status: DC

## 2012-10-11 ENCOUNTER — Encounter: Payer: Self-pay | Admitting: Family Medicine

## 2012-10-11 ENCOUNTER — Ambulatory Visit (INDEPENDENT_AMBULATORY_CARE_PROVIDER_SITE_OTHER): Payer: PRIVATE HEALTH INSURANCE | Admitting: Psychiatry

## 2012-10-11 ENCOUNTER — Encounter (HOSPITAL_COMMUNITY): Payer: Self-pay | Admitting: Psychiatry

## 2012-10-11 ENCOUNTER — Ambulatory Visit (INDEPENDENT_AMBULATORY_CARE_PROVIDER_SITE_OTHER): Payer: PRIVATE HEALTH INSURANCE | Admitting: Family Medicine

## 2012-10-11 VITALS — BP 108/65 | Ht 66.0 in | Wt 131.0 lb

## 2012-10-11 VITALS — BP 120/60 | HR 99 | Wt 132.0 lb

## 2012-10-11 DIAGNOSIS — M797 Fibromyalgia: Secondary | ICD-10-CM

## 2012-10-11 DIAGNOSIS — J45909 Unspecified asthma, uncomplicated: Secondary | ICD-10-CM

## 2012-10-11 DIAGNOSIS — F411 Generalized anxiety disorder: Secondary | ICD-10-CM

## 2012-10-11 DIAGNOSIS — F331 Major depressive disorder, recurrent, moderate: Secondary | ICD-10-CM

## 2012-10-11 DIAGNOSIS — J4540 Moderate persistent asthma, uncomplicated: Secondary | ICD-10-CM

## 2012-10-11 DIAGNOSIS — L659 Nonscarring hair loss, unspecified: Secondary | ICD-10-CM

## 2012-10-11 DIAGNOSIS — E039 Hypothyroidism, unspecified: Secondary | ICD-10-CM

## 2012-10-11 DIAGNOSIS — IMO0001 Reserved for inherently not codable concepts without codable children: Secondary | ICD-10-CM

## 2012-10-11 DIAGNOSIS — R238 Other skin changes: Secondary | ICD-10-CM

## 2012-10-11 DIAGNOSIS — F329 Major depressive disorder, single episode, unspecified: Secondary | ICD-10-CM
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
## 2012-10-11 NOTE — Progress Notes (Signed)
  Subjective:    Patient ID: Robin Conrad, female    DOB: 30-Oct-1950, 62 y.o.   MRN: [id number]  HPI Followup fibromyalgia-She is taking care of her grandaughter who is an infant. She also occ takes care of her 41 yo GD as well. Says has been fatigued.  Has been seeing Dr. Christell Constant and is handling her stress ok.  Says the tramadol does seem to help. Rarely has to take 3 in one day. She tries not to use it too much. She has been mostly taking it at night.  It does help control her pain so that she can fall asleep more easily. Sleep has been good overall.  Hair thinning - Says used to have really thick hair. Since dx with thyroid she has been losing hair on her crown and in the fornt of hair line.  She wasnts ot know if rogaine is ok to use.   Asthma - Her GERD is a big trigger for her asthma.  On her PPI twice a day.  Has really been trying to watch her diet.  Using her Symbicort regularly without any problems or side effects.  Anxiety-she's being followed with Dr. Christell Constant. There've been no recent changes to her bupropion or her citalopram.  Easy bruising-she's also noticed an increase in bruising easily. She says just a small bump cause a bruise. She's not sure if it just hurts skin thinning with age or if there might be something else going on.  Review of Systems     Objective:   Physical Exam  Constitutional: She is oriented to person, place, and time. She appears well-developed and well-nourished.  HENT:  Head: Normocephalic and atraumatic.  Cardiovascular: Normal rate, regular rhythm and normal heart sounds.   Pulmonary/Chest: Effort normal and breath sounds normal.  Neurological: She is alert and oriented to person, place, and time.  Skin: Skin is warm and dry.  She does have a couple of small bruises along her forearms.  Psychiatric: She has a normal mood and affect. Her behavior is normal.   Note, she is wearing a postop shoe on her left foot today for a fifth toe fracture. She's been  followed by Dr. Rodney Langton for this issue.       Assessment & Plan:  Fibromylgia - Stable. No acute flare right now.  Continue to keep activity up and stay active. Will continue current regimen with the medications. Continue tramadol as needed. Continue make sure that she's using it judiciously.Marland Kitchen    Alopecia - Ok to use rogaine.  She is post menopausal.  Discussed increase risk of birth defects and wash hands really carefully after application.    Asthma-no recent exacerbations urology is doing well and has been trying to control her diet as well to control her reflux. Use a peak flow meter if needed to determine if it's her asthma or reflux causing her symptoms. She would like to continue her Symbicort for now.   Anxiety-followed by Dr. Christell Constant.  Easy bruising-will check liver function and a CBC with differential. Consider PT PTT if needed but I suspect this is probably just been skin from aging.

## 2012-10-18 ENCOUNTER — Ambulatory Visit (INDEPENDENT_AMBULATORY_CARE_PROVIDER_SITE_OTHER): Payer: PRIVATE HEALTH INSURANCE | Admitting: Licensed Clinical Social Worker

## 2012-10-18 DIAGNOSIS — F411 Generalized anxiety disorder: Secondary | ICD-10-CM
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
## 2012-10-26 ENCOUNTER — Ambulatory Visit (INDEPENDENT_AMBULATORY_CARE_PROVIDER_SITE_OTHER): Payer: PRIVATE HEALTH INSURANCE | Admitting: Sports Medicine

## 2012-10-26 ENCOUNTER — Ambulatory Visit (INDEPENDENT_AMBULATORY_CARE_PROVIDER_SITE_OTHER): Payer: PRIVATE HEALTH INSURANCE

## 2012-10-26 ENCOUNTER — Encounter: Payer: Self-pay | Admitting: Sports Medicine

## 2012-10-26 VITALS — BP 110/64 | HR 88 | Wt 134.0 lb

## 2012-10-26 DIAGNOSIS — S92919A Unspecified fracture of unspecified toe(s), initial encounter for closed fracture: Secondary | ICD-10-CM

## 2012-10-26 DIAGNOSIS — S92502A Displaced unspecified fracture of left lesser toe(s), initial encounter for closed fracture: Secondary | ICD-10-CM

## 2012-10-26 DIAGNOSIS — S92502D Displaced unspecified fracture of left lesser toe(s), subsequent encounter for fracture with routine healing: Secondary | ICD-10-CM

## 2012-10-26 DIAGNOSIS — IMO0001 Reserved for inherently not codable concepts without codable children: Secondary | ICD-10-CM

## 2012-10-26 LAB — CBC WITH DIFFERENTIAL/PLATELET
Basophils Absolute: 0.1 10*3/uL (ref 0.0–0.1)
Basophils Relative: 2 % — ABNORMAL HIGH (ref 0–1)
Eosinophils Relative: 2 % (ref 0–5)
HCT: 39.4 % (ref 36.0–46.0)
Hemoglobin: 13.2 g/dL (ref 12.0–15.0)
MCH: 30.6 pg (ref 26.0–34.0)
MCHC: 33.5 g/dL (ref 30.0–36.0)
MCV: 91.4 fL (ref 78.0–100.0)
Monocytes Absolute: 0.4 10*3/uL (ref 0.1–1.0)
Monocytes Relative: 8 % (ref 3–12)
Neutro Abs: 2.1 10*3/uL (ref 1.7–7.7)
RDW: 13.5 % (ref 11.5–15.5)

## 2012-10-26 LAB — COMPLETE METABOLIC PANEL WITH GFR
AST: 33 U/L (ref 0–37)
Albumin: 4.5 g/dL (ref 3.5–5.2)
Alkaline Phosphatase: 121 U/L — ABNORMAL HIGH (ref 39–117)
BUN: 14 mg/dL (ref 6–23)
Calcium: 9.8 mg/dL (ref 8.4–10.5)
Chloride: 103 mEq/L (ref 96–112)
Creat: 0.79 mg/dL (ref 0.50–1.10)
GFR, Est Non African American: 80 mL/min
Glucose, Bld: 75 mg/dL (ref 70–99)
Potassium: 4.1 mEq/L (ref 3.5–5.3)

## 2012-10-26 IMAGING — CR DG TOE 5TH 2+V*L*
1 series · 1 of 1 positions shown · non-contrast
Comparison: Left fifth toe films of [DATE]

CLINICAL DATA: Left fifth toe fracture, follow-up

DG TOE 5TH LEFT

[view not recorded]
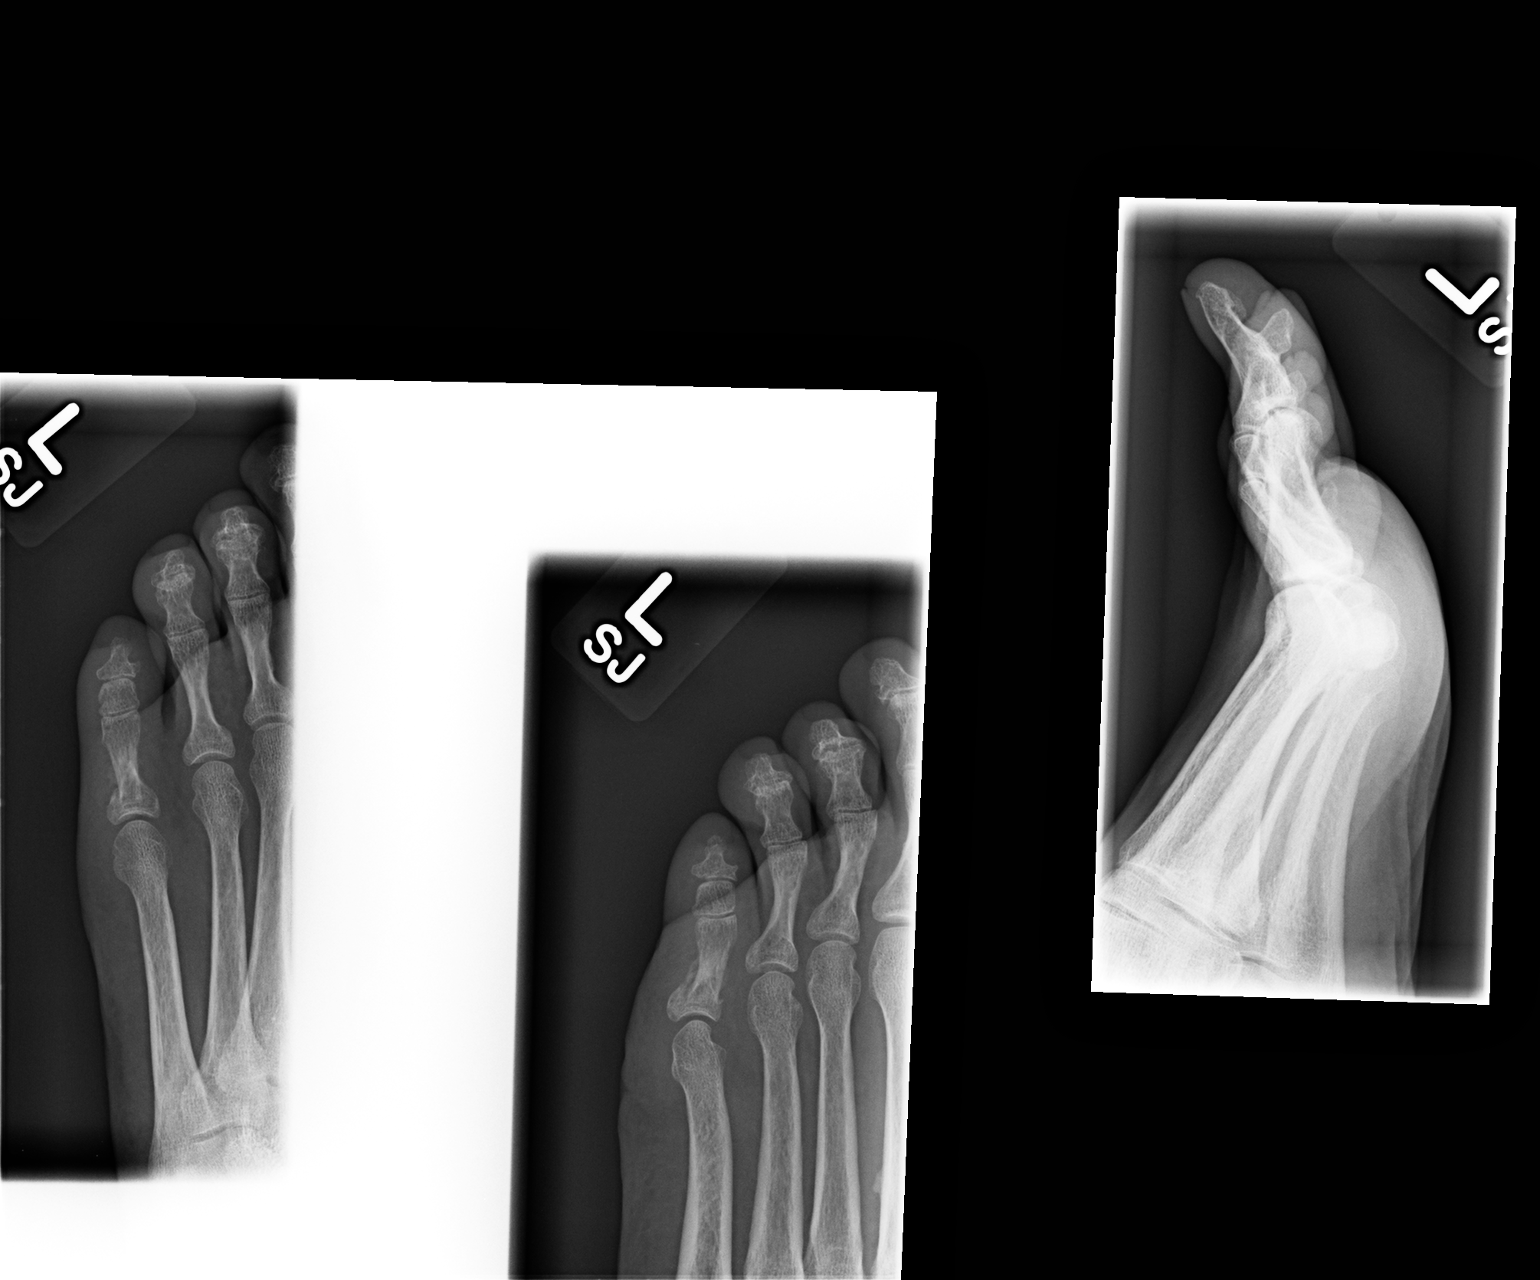

[1 of 1 positions shown; findings below may reference images not displayed]

FINDINGS: There is little change in position or alignment of the
fracture of the base of the proximal phalanx of the left fifth toe.
There is some callus formation present.  No other abnormality is
seen.
IMPRESSION: Some callus formation surrounds the fracture through the base of
the proximal phalanx of the left fifth toe.  No significant
displacement of the fracture fragments is seen.

## 2012-10-26 NOTE — Progress Notes (Signed)
  Subjective: Three-week status post minimally angulated nondisplaced fracture through the proximal phalanx of the fifth digit on the left foot. Overall better but still some pain.   Objective: General: Well-developed, well-nourished, and in no acute distress. Minimal tenderness to palpation over the fracture site.  Toes were buddy taped, and placed back in a postop shoe.   X-rays were reviewed and show good signs of bony callus.  Assessment/plan:

## 2012-10-26 NOTE — Assessment & Plan Note (Signed)
Continue postop shoe and buddy taping for now. Return to see me in 2-1/2 weeks, this will take around to approximately 6 weeks and I expect her to be healed at that time.

## 2012-10-27 ENCOUNTER — Other Ambulatory Visit (HOSPITAL_COMMUNITY): Payer: Self-pay | Admitting: Psychiatry

## 2012-10-27 MED ORDER — ZOLPIDEM TARTRATE 5 MG PO TABS: ORAL_TABLET | ORAL | Status: DC

## 2012-11-02 ENCOUNTER — Ambulatory Visit: Payer: Self-pay | Admitting: Critical Care Medicine

## 2012-11-06 ENCOUNTER — Ambulatory Visit (INDEPENDENT_AMBULATORY_CARE_PROVIDER_SITE_OTHER): Payer: PRIVATE HEALTH INSURANCE | Admitting: Critical Care Medicine

## 2012-11-06 ENCOUNTER — Encounter: Payer: Self-pay | Admitting: Critical Care Medicine

## 2012-11-06 VITALS — BP 112/70 | HR 114 | Temp 98.1°F | Ht 66.0 in | Wt 131.0 lb

## 2012-11-06 DIAGNOSIS — J45909 Unspecified asthma, uncomplicated: Secondary | ICD-10-CM

## 2012-11-06 DIAGNOSIS — J4540 Moderate persistent asthma, uncomplicated: Secondary | ICD-10-CM

## 2012-11-06 MED ORDER — BECLOMETHASONE DIPROPIONATE 80 MCG/ACT NA AERS: 2.0000 | INHALATION_SPRAY | Freq: Two times a day (BID) | NASAL | Status: DC

## 2012-11-06 MED ORDER — AZELASTINE-FLUTICASONE 137-50 MCG/ACT NA SUSP: 1.0000 | Freq: Two times a day (BID) | NASAL | Status: DC

## 2012-11-06 MED ORDER — LANSOPRAZOLE 30 MG PO CPDR: 30.0000 mg | DELAYED_RELEASE_CAPSULE | Freq: Two times a day (BID) | ORAL | Status: DC

## 2012-11-06 NOTE — Patient Instructions (Addendum)
No change in medications other than trial of Dymista one puff each nostril twice daily and hold Qnasl during trial, fill Rx and call us if Dymista works better than QNasl, otherwise resume QNasl Return 4 months

## 2012-11-06 NOTE — Assessment & Plan Note (Signed)
Moderate persistent asthma stable at this time Plan Maintain inhaled medications as prescribed For severe allergic rhinitis we'll substitute Dymista for QNasl
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
## 2012-11-08 ENCOUNTER — Ambulatory Visit (INDEPENDENT_AMBULATORY_CARE_PROVIDER_SITE_OTHER): Payer: PRIVATE HEALTH INSURANCE | Admitting: Sports Medicine

## 2012-11-08 ENCOUNTER — Encounter: Payer: Self-pay | Admitting: Sports Medicine

## 2012-11-08 VITALS — BP 118/65 | HR 90 | Wt 132.0 lb

## 2012-11-08 DIAGNOSIS — S92502D Displaced unspecified fracture of left lesser toe(s), subsequent encounter for fracture with routine healing: Secondary | ICD-10-CM

## 2012-11-08 DIAGNOSIS — IMO0001 Reserved for inherently not codable concepts without codable children: Secondary | ICD-10-CM

## 2012-11-08 NOTE — Progress Notes (Signed)
  Subjective: Six-week status post closed nondisplaced fracture of the left fifth proximal phalanx, almost completely pain-free.   Objective: General: Well-developed, well-nourished, and in no acute distress. Toe is only minimally tender to palpation over the fracture site, still a little bit swollen, but overall continues to improve. Gait is normal.  Assessment/plan:

## 2012-11-08 NOTE — Assessment & Plan Note (Signed)
With good bony callus on the x-rays we do not need any repeat films. She continues to improve on a daily basis and is almost to baseline now. I can see her back on an as-needed basis.

## 2012-11-16 ENCOUNTER — Ambulatory Visit (HOSPITAL_COMMUNITY): Payer: Self-pay | Admitting: Licensed Clinical Social Worker

## 2012-11-21 ENCOUNTER — Ambulatory Visit (HOSPITAL_COMMUNITY): Payer: Self-pay | Admitting: Licensed Clinical Social Worker

## 2012-11-24 ENCOUNTER — Telehealth: Payer: Self-pay | Admitting: *Deleted

## 2012-11-24 MED ORDER — BUDESONIDE-FORMOTEROL FUMARATE 160-4.5 MCG/ACT IN AERO: 2.0000 | INHALATION_SPRAY | Freq: Two times a day (BID) | RESPIRATORY_TRACT | Status: DC

## 2012-11-24 NOTE — Telephone Encounter (Signed)
Pt is Dr. Delford Field patient requesting sample Symbicort 160 d/t misplacement of current HFA; verified pt and left sample at front desk for pick-up/SLS

## 2012-12-04 ENCOUNTER — Other Ambulatory Visit: Payer: Self-pay | Admitting: *Deleted

## 2012-12-04 ENCOUNTER — Other Ambulatory Visit (HOSPITAL_COMMUNITY): Payer: Self-pay | Admitting: Psychiatry

## 2012-12-04 ENCOUNTER — Other Ambulatory Visit: Payer: Self-pay | Admitting: Family Medicine

## 2012-12-04 DIAGNOSIS — S92502A Displaced unspecified fracture of left lesser toe(s), initial encounter for closed fracture: Secondary | ICD-10-CM

## 2012-12-04 MED ORDER — TRAMADOL HCL 50 MG PO TABS: 100.0000 mg | ORAL_TABLET | Freq: Three times a day (TID) | ORAL | Status: DC | PRN

## 2012-12-06 ENCOUNTER — Ambulatory Visit (INDEPENDENT_AMBULATORY_CARE_PROVIDER_SITE_OTHER): Payer: PRIVATE HEALTH INSURANCE | Admitting: Licensed Clinical Social Worker

## 2012-12-06 DIAGNOSIS — F3289 Other specified depressive episodes: Secondary | ICD-10-CM

## 2012-12-06 DIAGNOSIS — F329 Major depressive disorder, single episode, unspecified: Secondary | ICD-10-CM

## 2012-12-06 NOTE — Progress Notes (Signed)
   THERAPIST PROGRESS NOTE  Session Time: 4:00 - 4:50  Participation Level: Active  Behavioral Response: NeatAlertAnxious and Euthymic  Type of Therapy: Individual Therapy  Treatment Goals addressed: Anxiety  Interventions: Motivational Interviewing and Supportive  Summary: Robin Conrad is a 62 y.o. female who presents with anxiety.  Robin Conrad reporting on situation at church where her husband is the [employer].  Lots of examples of how difficult the people there are to work with.  It is a dying church which has no hope for change because they do not want to change anything.  Her husband is depressed and she wants him to get some medicine to help but he probably won't.  He has before and it helped him a lot and then he did not need it and stopped.  He is to retire in two years - she figures he will find something to do for that change will be too much for him.  She would love to have some time with him but she has not had him available for years so now she used to being on her own and she figures it might be difficult to have him around.  She is also concerned about her daughter and granddaughter - their relation ship is not good and she is having severe melt downs with her mother - some is because of the new baby but she was like that before. Robin Conrad has no trouble with her - it took a little while but now that she has a routing she responds well - it is calm at [employer] and she offers the structure that the child needs.  Mother does not have that  [place] for she has her baby, her husband and her job.  Robin Conrad observed a couple of the melt downs and suggest she take her to a therapist and figure out how to help her.  Robin Conrad is remembering her daughter who had problems they were never able to solve.  At the end of the sessio she got teary because Robin Conrad said something to her that was critical about what she was talking about.  She feels she gets that from her and her husband - that she cannot be  herself for they have something to say about it.  Tried to help her see that these comments had to do with their problem not hers.. She gets excited when out with people and he does not like that - it probably embarrasses him for getting excited with people is not a real problem.  She is tired of getting comments from both of therm  Supported her to be herself and not worry about anyone's comments.  Suicidal/Homicidal: No  Plan: Return again in 4 weeks.  Diagnosis: Axis I: Depressive Disorder NOS    Axis II: Deferred    [name],JUDITH A, LCSW 12/06/2012

## 2012-12-28 ENCOUNTER — Ambulatory Visit (INDEPENDENT_AMBULATORY_CARE_PROVIDER_SITE_OTHER): Payer: PRIVATE HEALTH INSURANCE | Admitting: Sports Medicine

## 2012-12-28 DIAGNOSIS — Z23 Encounter for immunization: Secondary | ICD-10-CM

## 2012-12-28 NOTE — Progress Notes (Signed)
I was present for all essential parts of this visit and procedure.  Thomas J. Thekkekandam, M.D.   

## 2013-01-03 ENCOUNTER — Other Ambulatory Visit: Payer: Self-pay | Admitting: Family Medicine

## 2013-01-03 ENCOUNTER — Ambulatory Visit (INDEPENDENT_AMBULATORY_CARE_PROVIDER_SITE_OTHER): Payer: PRIVATE HEALTH INSURANCE | Admitting: Licensed Clinical Social Worker

## 2013-01-04 ENCOUNTER — Other Ambulatory Visit: Payer: Self-pay

## 2013-01-04 ENCOUNTER — Other Ambulatory Visit: Payer: Self-pay | Admitting: Sports Medicine

## 2013-01-05 ENCOUNTER — Telehealth: Payer: Self-pay

## 2013-01-05 NOTE — Telephone Encounter (Signed)
Patient advised.

## 2013-01-05 NOTE — Telephone Encounter (Signed)
I would recommend Robin Conrad at [facility] in [place] (not far from here) for therapy.  Dr. Dellia Cloud works with her and he is psychiatrist.

## 2013-01-05 NOTE — Telephone Encounter (Signed)
Robin Conrad states her psychiatrist, Dr Christell Constant, and therapist, Jacklynn Barnacle, is leaving. She is not sure if Dr Laury Deep will be a good fit for her due to the fact he doesn't like to write for xanax. Can you recommend a psychiatrist and a therapist?

## 2013-01-11 ENCOUNTER — Ambulatory Visit (INDEPENDENT_AMBULATORY_CARE_PROVIDER_SITE_OTHER): Payer: PRIVATE HEALTH INSURANCE | Admitting: Psychiatry

## 2013-01-11 ENCOUNTER — Encounter (HOSPITAL_COMMUNITY): Payer: Self-pay | Admitting: Psychiatry

## 2013-01-11 VITALS — BP 118/68 | Ht 66.0 in | Wt 132.0 lb

## 2013-01-11 DIAGNOSIS — F331 Major depressive disorder, recurrent, moderate: Secondary | ICD-10-CM

## 2013-01-11 DIAGNOSIS — F329 Major depressive disorder, single episode, unspecified: Secondary | ICD-10-CM

## 2013-01-11 DIAGNOSIS — F411 Generalized anxiety disorder: Secondary | ICD-10-CM

## 2013-01-11 MED ORDER — CITALOPRAM HYDROBROMIDE 10 MG PO TABS: ORAL_TABLET | ORAL | Status: DC

## 2013-01-11 MED ORDER — BUPROPION HCL ER (XL) 150 MG PO TB24: 450.0000 mg | ORAL_TABLET | ORAL | Status: DC
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
## 2013-01-15 ENCOUNTER — Other Ambulatory Visit: Payer: Self-pay | Admitting: Critical Care Medicine

## 2013-01-24 ENCOUNTER — Ambulatory Visit (HOSPITAL_COMMUNITY): Payer: Self-pay | Admitting: Licensed Clinical Social Worker

## 2013-01-25 ENCOUNTER — Other Ambulatory Visit: Payer: Self-pay | Admitting: Family Medicine

## 2013-01-30 ENCOUNTER — Ambulatory Visit (INDEPENDENT_AMBULATORY_CARE_PROVIDER_SITE_OTHER): Payer: PRIVATE HEALTH INSURANCE | Admitting: Licensed Clinical Social Worker

## 2013-01-30 DIAGNOSIS — F411 Generalized anxiety disorder: Secondary | ICD-10-CM

## 2013-01-30 DIAGNOSIS — IMO0002 Reserved for concepts with insufficient information to code with codable children: Secondary | ICD-10-CM

## 2013-02-13 ENCOUNTER — Ambulatory Visit (INDEPENDENT_AMBULATORY_CARE_PROVIDER_SITE_OTHER): Payer: PRIVATE HEALTH INSURANCE | Admitting: Licensed Clinical Social Worker

## 2013-02-13 DIAGNOSIS — F411 Generalized anxiety disorder: Secondary | ICD-10-CM

## 2013-02-13 DIAGNOSIS — IMO0002 Reserved for concepts with insufficient information to code with codable children: Secondary | ICD-10-CM

## 2013-02-14 ENCOUNTER — Encounter: Payer: Self-pay | Admitting: Family Medicine

## 2013-02-14 ENCOUNTER — Ambulatory Visit (INDEPENDENT_AMBULATORY_CARE_PROVIDER_SITE_OTHER): Payer: PRIVATE HEALTH INSURANCE | Admitting: Family Medicine

## 2013-02-14 VITALS — BP 121/64 | HR 102 | Temp 97.8°F | Ht 66.0 in | Wt 129.0 lb

## 2013-02-14 DIAGNOSIS — M797 Fibromyalgia: Secondary | ICD-10-CM

## 2013-02-14 DIAGNOSIS — IMO0001 Reserved for inherently not codable concepts without codable children: Secondary | ICD-10-CM

## 2013-02-14 DIAGNOSIS — F411 Generalized anxiety disorder: Secondary | ICD-10-CM

## 2013-02-14 DIAGNOSIS — F329 Major depressive disorder, single episode, unspecified: Secondary | ICD-10-CM

## 2013-02-14 DIAGNOSIS — K5900 Constipation, unspecified: Secondary | ICD-10-CM

## 2013-02-14 DIAGNOSIS — L719 Rosacea, unspecified: Secondary | ICD-10-CM

## 2013-02-14 DIAGNOSIS — F3289 Other specified depressive episodes: Secondary | ICD-10-CM

## 2013-02-14 MED ORDER — METRONIDAZOLE 1 % EX GEL: Freq: Every day | CUTANEOUS | Status: DC

## 2013-02-14 MED ORDER — BUPROPION HCL ER (XL) 150 MG PO TB24: 450.0000 mg | ORAL_TABLET | ORAL | Status: DC

## 2013-02-14 MED ORDER — BENZONATATE 200 MG PO CAPS: 200.0000 mg | ORAL_CAPSULE | Freq: Three times a day (TID) | ORAL | Status: DC | PRN

## 2013-02-14 MED ORDER — CYCLOBENZAPRINE HCL 10 MG PO TABS: ORAL_TABLET | ORAL | Status: DC

## 2013-02-14 MED ORDER — LINACLOTIDE 290 MCG PO CAPS: 290.0000 ug | ORAL_CAPSULE | Freq: Every day | ORAL | Status: DC

## 2013-02-14 MED ORDER — GABAPENTIN 400 MG PO CAPS: ORAL_CAPSULE | ORAL | Status: DC

## 2013-02-14 NOTE — Progress Notes (Signed)
   Subjective:    Patient ID: Robin Conrad, female    DOB: 18-Jun-1950, 62 y.o.   MRN: [id number]  HPI Asthma-She is in a research study at [facility] for her asthma.  Dr. Delford Field knows about it.  Doing some DNA testing, labwork,etc.  Not on any medications per them.  She would like a refill on the [employer]. After she had her bronchoscopy as part of research study she developed a cough for several days. She want to have a refill on hand in case needed. She is going to potentially have a repeat bronchoscopy at some point during the study.  Fibromyalgia - doing well overall. Keeping her granddaughter with special needs. Using tramadol as needed.  Alopecia - she has not tried the Rogaine yet. She still thinking about it.  Anxiety/depression - Her psych and counselor has left.  She has seen Raynelle Fanning whit and really likes her. Will start seeing her weekly. She is really happy with her current medicaiton regimen and wants to know if I would take over it. Says does take her xanax up to TID prn.    Constipation- Says the amitiza not really helping. She says it worked for about 3 weeks and then stopped working. She would like to try Linzess . She says her husband tried it.  Just reports a history of rosacea. She says occasionally she'll break out with red bumps along her face. They didn't occur more when she is stressed. She says she used some type of prescription ointment or gel in the past that was helpful. She mostly uses it as needed. She wants to know she can have a prescription for that today.  Review of Systems     Objective:   Physical Exam  Constitutional: She is oriented to person, place, and time. She appears well-developed and well-nourished.  HENT:  Head: Normocephalic and atraumatic.  Cardiovascular: Normal rate, regular rhythm and normal heart sounds.   Pulmonary/Chest: Effort normal and breath sounds normal.  Neurological: She is alert and oriented to person, place, and time.  Skin:  Skin is warm and dry.  Psychiatric: She has a normal mood and affect. Her behavior is normal.          Assessment & Plan:  Anxiety/depression -overall she's happy with her current regimen and doing fairly well. I will take over prescribing her Wellbutrin and her Xanax and Ambien. Followup in 3-4 months. Continue Wellbutrin, citalopram, and Xanax. Continue Xanax sparingly. She fully understands the potential for dependency and abuse. I'm glad that she is now working with a new therapist since her previous therapist retired. Hopefully they will continue to develop a good rapport.  Fibromyalgia - doing well overall.  No recent flares or exacerbations.  Constipation- she would like to try Linzess. Also never new prescription to the pharmacy. Also given samples today.  Rosacea-will send a new prescription for metronidazole gel.

## 2013-02-22 ENCOUNTER — Ambulatory Visit (INDEPENDENT_AMBULATORY_CARE_PROVIDER_SITE_OTHER): Payer: PRIVATE HEALTH INSURANCE | Admitting: Licensed Clinical Social Worker

## 2013-02-22 DIAGNOSIS — F411 Generalized anxiety disorder: Secondary | ICD-10-CM

## 2013-02-22 DIAGNOSIS — IMO0002 Reserved for concepts with insufficient information to code with codable children: Secondary | ICD-10-CM

## 2013-03-01 ENCOUNTER — Ambulatory Visit (INDEPENDENT_AMBULATORY_CARE_PROVIDER_SITE_OTHER): Payer: PRIVATE HEALTH INSURANCE | Admitting: Licensed Clinical Social Worker

## 2013-03-01 DIAGNOSIS — IMO0002 Reserved for concepts with insufficient information to code with codable children: Secondary | ICD-10-CM

## 2013-03-01 DIAGNOSIS — F411 Generalized anxiety disorder: Secondary | ICD-10-CM

## 2013-03-06 ENCOUNTER — Ambulatory Visit (INDEPENDENT_AMBULATORY_CARE_PROVIDER_SITE_OTHER): Payer: PRIVATE HEALTH INSURANCE | Admitting: Licensed Clinical Social Worker

## 2013-03-06 DIAGNOSIS — IMO0002 Reserved for concepts with insufficient information to code with codable children: Secondary | ICD-10-CM

## 2013-03-06 DIAGNOSIS — F411 Generalized anxiety disorder: Secondary | ICD-10-CM

## 2013-03-22 ENCOUNTER — Ambulatory Visit (INDEPENDENT_AMBULATORY_CARE_PROVIDER_SITE_OTHER): Payer: PRIVATE HEALTH INSURANCE | Admitting: Licensed Clinical Social Worker

## 2013-03-22 DIAGNOSIS — F411 Generalized anxiety disorder: Secondary | ICD-10-CM

## 2013-03-22 DIAGNOSIS — IMO0002 Reserved for concepts with insufficient information to code with codable children: Secondary | ICD-10-CM

## 2013-03-27 ENCOUNTER — Ambulatory Visit (INDEPENDENT_AMBULATORY_CARE_PROVIDER_SITE_OTHER): Payer: PRIVATE HEALTH INSURANCE | Admitting: Licensed Clinical Social Worker

## 2013-03-27 DIAGNOSIS — F411 Generalized anxiety disorder: Secondary | ICD-10-CM

## 2013-03-27 DIAGNOSIS — IMO0002 Reserved for concepts with insufficient information to code with codable children: Secondary | ICD-10-CM

## 2013-03-30 ENCOUNTER — Ambulatory Visit (INDEPENDENT_AMBULATORY_CARE_PROVIDER_SITE_OTHER): Payer: PRIVATE HEALTH INSURANCE | Admitting: Family Medicine

## 2013-03-30 ENCOUNTER — Other Ambulatory Visit: Payer: Self-pay | Admitting: Sports Medicine

## 2013-03-30 ENCOUNTER — Other Ambulatory Visit: Payer: Self-pay

## 2013-03-30 ENCOUNTER — Encounter: Payer: Self-pay | Admitting: Family Medicine

## 2013-03-30 VITALS — BP 120/72 | HR 97 | Temp 97.7°F | Ht 66.6 in | Wt 129.0 lb

## 2013-03-30 DIAGNOSIS — H65199 Other acute nonsuppurative otitis media, unspecified ear: Secondary | ICD-10-CM

## 2013-03-30 DIAGNOSIS — H65191 Other acute nonsuppurative otitis media, right ear: Secondary | ICD-10-CM

## 2013-03-30 DIAGNOSIS — H938X1 Other specified disorders of right ear: Secondary | ICD-10-CM

## 2013-03-30 DIAGNOSIS — R0981 Nasal congestion: Secondary | ICD-10-CM

## 2013-03-30 DIAGNOSIS — H938X9 Other specified disorders of ear, unspecified ear: Secondary | ICD-10-CM

## 2013-03-30 DIAGNOSIS — J3489 Other specified disorders of nose and nasal sinuses: Secondary | ICD-10-CM

## 2013-03-30 MED ORDER — AMOXICILLIN-POT CLAVULANATE 875-125 MG PO TABS: 1.0000 | ORAL_TABLET | Freq: Two times a day (BID) | ORAL | Status: DC

## 2013-03-30 NOTE — Progress Notes (Signed)
   Subjective:    Patient ID: Robin Conrad, female    DOB: 12-Apr-1950, 63 y.o.   MRN: [id number]  HPI   Right ear clogged x 2  Weeks. No pain or itching. No fever or chills.  Says tried using some deep rocks as well as some peroxide thinking that it could be cerumen obstructing. She does have some mild nasal congestion.  She just completed round of ABX for UTI. Thinks it was nitrofurantoin  She has been congested for a couple of weeks.    Review of Systems     Objective:   Physical Exam  Constitutional: She appears well-developed and well-nourished.  HENT:  Head: Normocephalic and atraumatic.  Right Ear: External ear normal.  Left Ear: External ear normal.  Left TM is bulging some. No erythema or thicness. Canal is clear.  Able to visualize the ossicle.  Skin: Skin is warm and dry.  Psychiatric: She has a normal mood and affect. Her behavior is normal.          Assessment & Plan:  Right ear fullness-exam itself is normal with maybe a little fluid behind the eardrum. Tympanometry shows low peak volume which is most consistent with otitis media with effusion. Even though the eardrum itself is not red or erythematous there is some fluid behind it. She takes Zyrtec on a daily basis for her allergies. We'll go ahead and treat with Augmentin. If she does not notice any significant improvement in the next 2 weeks and please call me back and we'll consider ENT referral for further evaluation. I suspect she may also have some sinusitis since she's had persistent nasal congestion for last 2 weeks as well.Marland Kitchen

## 2013-03-30 NOTE — Patient Instructions (Signed)
Call if not better in 2 weeks  

## 2013-04-02 ENCOUNTER — Telehealth: Payer: Self-pay | Admitting: *Deleted

## 2013-04-02 NOTE — Telephone Encounter (Signed)
Pt left message wanting you to know that on Sat she had a pretty bad episode of vertigo & had to lay down for over an hour before it passed.

## 2013-04-02 NOTE — Telephone Encounter (Signed)
Does she have some meclizine for vertigo?

## 2013-04-03 ENCOUNTER — Ambulatory Visit (INDEPENDENT_AMBULATORY_CARE_PROVIDER_SITE_OTHER): Payer: PRIVATE HEALTH INSURANCE | Admitting: Licensed Clinical Social Worker

## 2013-04-03 DIAGNOSIS — IMO0002 Reserved for concepts with insufficient information to code with codable children: Secondary | ICD-10-CM

## 2013-04-03 DIAGNOSIS — F411 Generalized anxiety disorder: Secondary | ICD-10-CM

## 2013-04-03 MED ORDER — MECLIZINE HCL 25 MG PO TABS: 25.0000 mg | ORAL_TABLET | Freq: Three times a day (TID) | ORAL | Status: DC | PRN

## 2013-04-03 NOTE — Telephone Encounter (Signed)
New Rx sent.

## 2013-04-03 NOTE — Telephone Encounter (Signed)
LMOM notifying pt.

## 2013-04-03 NOTE — Telephone Encounter (Signed)
Spoke with pt & she does not have any meclizine but would like some sent to [facility].

## 2013-04-10 ENCOUNTER — Ambulatory Visit (INDEPENDENT_AMBULATORY_CARE_PROVIDER_SITE_OTHER): Payer: PRIVATE HEALTH INSURANCE | Admitting: Licensed Clinical Social Worker

## 2013-04-10 ENCOUNTER — Ambulatory Visit (INDEPENDENT_AMBULATORY_CARE_PROVIDER_SITE_OTHER): Payer: PRIVATE HEALTH INSURANCE | Admitting: Critical Care Medicine

## 2013-04-10 ENCOUNTER — Encounter: Payer: Self-pay | Admitting: Critical Care Medicine

## 2013-04-10 ENCOUNTER — Telehealth: Payer: Self-pay | Admitting: *Deleted

## 2013-04-10 VITALS — BP 122/80 | HR 104 | Temp 98.1°F | Ht 66.0 in | Wt 131.0 lb

## 2013-04-10 DIAGNOSIS — F411 Generalized anxiety disorder: Secondary | ICD-10-CM

## 2013-04-10 DIAGNOSIS — J4540 Moderate persistent asthma, uncomplicated: Secondary | ICD-10-CM

## 2013-04-10 DIAGNOSIS — IMO0002 Reserved for concepts with insufficient information to code with codable children: Secondary | ICD-10-CM

## 2013-04-10 DIAGNOSIS — J45909 Unspecified asthma, uncomplicated: Secondary | ICD-10-CM

## 2013-04-10 DIAGNOSIS — H9209 Otalgia, unspecified ear: Secondary | ICD-10-CM

## 2013-04-10 NOTE — Telephone Encounter (Signed)
Referral placed.
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
## 2013-04-10 NOTE — Patient Instructions (Signed)
Use spacer with symbicort Try [place] throat lozenge You need ENT evaluation Return 4 months

## 2013-04-10 NOTE — Telephone Encounter (Signed)
Pt calls and LM on triage line in regards to her ear.  She states that you put her on antibitoic for sinus infection? And right ear pain- she states that there is no change and seen Dr. Joya Gaskins pulmonologist today and he looked in her ears and recommends her to see a ENT so she would like a referral to ENT. Clemetine Marker, LPN

## 2013-04-11 ENCOUNTER — Other Ambulatory Visit: Payer: Self-pay | Admitting: Family Medicine

## 2013-04-11 NOTE — Telephone Encounter (Signed)
Pt notified referral placed and that they will contact her with appointyment. Clemetine Marker, LPN

## 2013-04-11 NOTE — Assessment & Plan Note (Signed)
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

## 2013-04-19 ENCOUNTER — Ambulatory Visit (INDEPENDENT_AMBULATORY_CARE_PROVIDER_SITE_OTHER): Payer: PRIVATE HEALTH INSURANCE | Admitting: Licensed Clinical Social Worker

## 2013-04-19 DIAGNOSIS — F411 Generalized anxiety disorder: Secondary | ICD-10-CM

## 2013-04-19 DIAGNOSIS — IMO0002 Reserved for concepts with insufficient information to code with codable children: Secondary | ICD-10-CM

## 2013-04-20 ENCOUNTER — Encounter: Payer: Self-pay | Admitting: Family Medicine

## 2013-04-24 ENCOUNTER — Other Ambulatory Visit (HOSPITAL_COMMUNITY): Payer: Self-pay | Admitting: Psychiatry

## 2013-04-25 ENCOUNTER — Other Ambulatory Visit: Payer: Self-pay | Admitting: *Deleted

## 2013-04-25 DIAGNOSIS — F331 Major depressive disorder, recurrent, moderate: Secondary | ICD-10-CM

## 2013-04-25 MED ORDER — ZOLPIDEM TARTRATE 5 MG PO TABS: ORAL_TABLET | ORAL | Status: DC

## 2013-04-25 MED ORDER — CITALOPRAM HYDROBROMIDE 10 MG PO TABS: ORAL_TABLET | ORAL | Status: DC

## 2013-04-26 ENCOUNTER — Ambulatory Visit (INDEPENDENT_AMBULATORY_CARE_PROVIDER_SITE_OTHER): Payer: PRIVATE HEALTH INSURANCE | Admitting: Licensed Clinical Social Worker

## 2013-04-26 DIAGNOSIS — F411 Generalized anxiety disorder: Secondary | ICD-10-CM

## 2013-04-26 DIAGNOSIS — IMO0002 Reserved for concepts with insufficient information to code with codable children: Secondary | ICD-10-CM

## 2013-05-08 ENCOUNTER — Ambulatory Visit: Payer: PRIVATE HEALTH INSURANCE | Admitting: Licensed Clinical Social Worker

## 2013-05-15 ENCOUNTER — Encounter: Payer: Self-pay | Admitting: Family Medicine

## 2013-05-15 ENCOUNTER — Ambulatory Visit (INDEPENDENT_AMBULATORY_CARE_PROVIDER_SITE_OTHER): Payer: PRIVATE HEALTH INSURANCE | Admitting: Family Medicine

## 2013-05-15 VITALS — BP 104/59 | HR 116 | Temp 97.8°F | Wt 129.0 lb

## 2013-05-15 DIAGNOSIS — F3289 Other specified depressive episodes: Secondary | ICD-10-CM

## 2013-05-15 DIAGNOSIS — F329 Major depressive disorder, single episode, unspecified: Secondary | ICD-10-CM

## 2013-05-15 DIAGNOSIS — K5900 Constipation, unspecified: Secondary | ICD-10-CM

## 2013-05-15 DIAGNOSIS — M797 Fibromyalgia: Secondary | ICD-10-CM

## 2013-05-15 DIAGNOSIS — IMO0001 Reserved for inherently not codable concepts without codable children: Secondary | ICD-10-CM

## 2013-05-15 DIAGNOSIS — K5909 Other constipation: Secondary | ICD-10-CM

## 2013-05-15 NOTE — Progress Notes (Signed)
   Subjective:    Patient ID: Robin Conrad, female    DOB: [birth year], 63 y.o.   MRN: [id number]  HPI Mood-overall she's been doing well. She has been going to counseling and this has been very helpful for her. She feels like she is on a good regimen with her Wellbutrin and Celexa. She continues to stay as active as she can with her febrile gel which she feels helps. She is primarily taking care of her grandchildren but does try to make time for herself. In fact she recently got back into drawing which she really enjoys. She has been a little frustrated because she's had several things going on. She's had problems with mouth ulcers, sinus infection, urinary tract infection. She did see pulmonology who then referred her to ENT for her ears. She did report some mild hearing loss in the left ear but not the right. She still has some pressure in the right ear and would like me to look at today. In fact she says that she's had some urinary symptoms for the last week. She called the pharmacy and she does have 1 more prescription refill. She plans on picking up today.  Constipation-she says it took to Linzess 10 days to start working. She did not have a bowel movement during that time. Then she started having diarrhea. To the point that she was getting irritation sore and had to use her grandchild diaper rash cream. She felt like to Linzess gives inconsistent results. She would like to try something else. She already eats a very high fiber diet..  Fibromyalgia-feels stable on her current regimen. Has not had any major exacerbations or flares. No regular exercise but stays very active watching her grandchildren.  Review of Systems     Objective:   Physical Exam  Constitutional: She is oriented to person, place, and time. She appears well-developed and well-nourished.  HENT:  Head: Normocephalic and atraumatic.  Right Ear: External ear normal.  Left Ear: External ear normal.  Nose: Nose normal.   Mouth/Throat: Oropharynx is clear and moist.  TMs and canals are clear.   Eyes: Conjunctivae and EOM are normal. Pupils are equal, round, and reactive to light.  Neck: Neck supple. No thyromegaly present.  Cardiovascular: Normal rate, regular rhythm and normal heart sounds.   Pulmonary/Chest: Effort normal and breath sounds normal. She has no wheezes.  Lymphadenopathy:    She has no cervical adenopathy.  Neurological: She is alert and oriented to person, place, and time.  Skin: Skin is warm and dry.  Psychiatric: She has a normal mood and affect. Her behavior is normal.          Assessment & Plan:  Depression-overall stable. Continue with counseling. Continue Celexa and Wellbutrin which I think is working for her. Stays active as possible. Occasionally uses alprazolam.  Constipation-we can try Amitiza instead. Will check to see if we have any samples for her to try.  Fibromyalgia-encouraged regular activity especially as the weather gets warmer. Continue current regimen of treatment.  Urinary tract symptoms-offered to do a urinalysis today but she declined. She says she will go ahead and fill the antibiotic but is waiting for her at the pharmacy and if she's not better she will come in for urinalysis and culture.  Sinusitis-resolving. She feels much better overall. Still a little pressure in the right ear but the ear looks okay today.

## 2013-05-17 ENCOUNTER — Telehealth: Payer: Self-pay

## 2013-05-17 MED ORDER — MUPIROCIN 2 % EX OINT: TOPICAL_OINTMENT | CUTANEOUS | Status: DC

## 2013-05-17 NOTE — Telephone Encounter (Signed)
If she would like to pick up samples of Amitiza 24 mcg daily then she can do so she quit to try it for a week first. This arm sorry I forgot to grab this for her. I did go ahead and send over the ointment for the sores in her nose to the [facility].

## 2013-05-17 NOTE — Telephone Encounter (Signed)
Robin Conrad states she is waiting on two medications to be sent to the pharmacy. 1- Amitiza 2- an ointment for sores in her nose. Please advise. She did not receive any samples during her visit.

## 2013-05-18 NOTE — Telephone Encounter (Signed)
Left detailed message for patient to pick up samples 

## 2013-05-18 NOTE — Telephone Encounter (Signed)
Pt informed and samples of Amitiza placed up front for pickup.  Oscar La, LPN

## 2013-05-21 ENCOUNTER — Other Ambulatory Visit: Payer: Self-pay | Admitting: Family Medicine

## 2013-05-21 ENCOUNTER — Other Ambulatory Visit: Payer: Self-pay | Admitting: *Deleted

## 2013-05-21 MED ORDER — TRAMADOL HCL 50 MG PO TABS: ORAL_TABLET | ORAL | Status: DC

## 2013-05-24 ENCOUNTER — Ambulatory Visit: Payer: PRIVATE HEALTH INSURANCE | Admitting: Licensed Clinical Social Worker

## 2013-05-29 ENCOUNTER — Telehealth: Payer: Self-pay | Admitting: *Deleted

## 2013-05-29 MED ORDER — LINACLOTIDE 290 MCG PO CAPS: 290.0000 ug | ORAL_CAPSULE | Freq: Every day | ORAL | Status: DC

## 2013-05-29 MED ORDER — LUBIPROSTONE 24 MCG PO CAPS: 24.0000 ug | ORAL_CAPSULE | Freq: Two times a day (BID) | ORAL | Status: DC

## 2013-05-29 NOTE — Telephone Encounter (Signed)
[facility] Please call and have them cancel Rx for Linzess.

## 2013-05-29 NOTE — Telephone Encounter (Signed)
Pt lvm stating that she is doing pretty good on the Amitiza. It works better than the [place] she is able to have small BM and is drinking plenty of water. She wants to know since she is on the 7th day of this should she continue if so she is going to need a prescription to be sent to her pharmacy.Audelia Hives [place]

## 2013-05-31 NOTE — Telephone Encounter (Signed)
Pt informed.Robin Conrad  

## 2013-06-05 ENCOUNTER — Ambulatory Visit (INDEPENDENT_AMBULATORY_CARE_PROVIDER_SITE_OTHER): Payer: PRIVATE HEALTH INSURANCE | Admitting: Licensed Clinical Social Worker

## 2013-06-05 DIAGNOSIS — F411 Generalized anxiety disorder: Secondary | ICD-10-CM

## 2013-06-05 DIAGNOSIS — IMO0002 Reserved for concepts with insufficient information to code with codable children: Secondary | ICD-10-CM

## 2013-06-14 ENCOUNTER — Other Ambulatory Visit: Payer: Self-pay | Admitting: Family Medicine

## 2013-06-14 ENCOUNTER — Other Ambulatory Visit: Payer: Self-pay | Admitting: *Deleted

## 2013-06-14 MED ORDER — ALPRAZOLAM 0.25 MG PO TABS: ORAL_TABLET | ORAL | Status: DC

## 2013-06-21 ENCOUNTER — Other Ambulatory Visit: Payer: Self-pay | Admitting: *Deleted

## 2013-06-21 MED ORDER — ZOLPIDEM TARTRATE 5 MG PO TABS: ORAL_TABLET | ORAL | Status: DC

## 2013-06-26 ENCOUNTER — Ambulatory Visit (INDEPENDENT_AMBULATORY_CARE_PROVIDER_SITE_OTHER): Payer: PRIVATE HEALTH INSURANCE | Admitting: Licensed Clinical Social Worker

## 2013-06-26 DIAGNOSIS — IMO0002 Reserved for concepts with insufficient information to code with codable children: Secondary | ICD-10-CM

## 2013-06-26 DIAGNOSIS — F411 Generalized anxiety disorder: Secondary | ICD-10-CM

## 2013-07-05 ENCOUNTER — Other Ambulatory Visit: Payer: Self-pay | Admitting: *Deleted

## 2013-07-05 MED ORDER — TRAMADOL HCL 50 MG PO TABS: ORAL_TABLET | ORAL | Status: DC

## 2013-07-12 ENCOUNTER — Ambulatory Visit (INDEPENDENT_AMBULATORY_CARE_PROVIDER_SITE_OTHER): Payer: PRIVATE HEALTH INSURANCE | Admitting: Licensed Clinical Social Worker

## 2013-07-12 DIAGNOSIS — F411 Generalized anxiety disorder: Secondary | ICD-10-CM

## 2013-07-12 DIAGNOSIS — IMO0002 Reserved for concepts with insufficient information to code with codable children: Secondary | ICD-10-CM

## 2013-07-18 IMAGING — CR DG TOE 5TH 2+V*L*
2 series · 2 of 2 positions shown · non-contrast
Comparison: None.

CLINICAL DATA: Blunt trauma fifth digit

DG TOE 5TH LEFT

[view not recorded (1 of 2)]
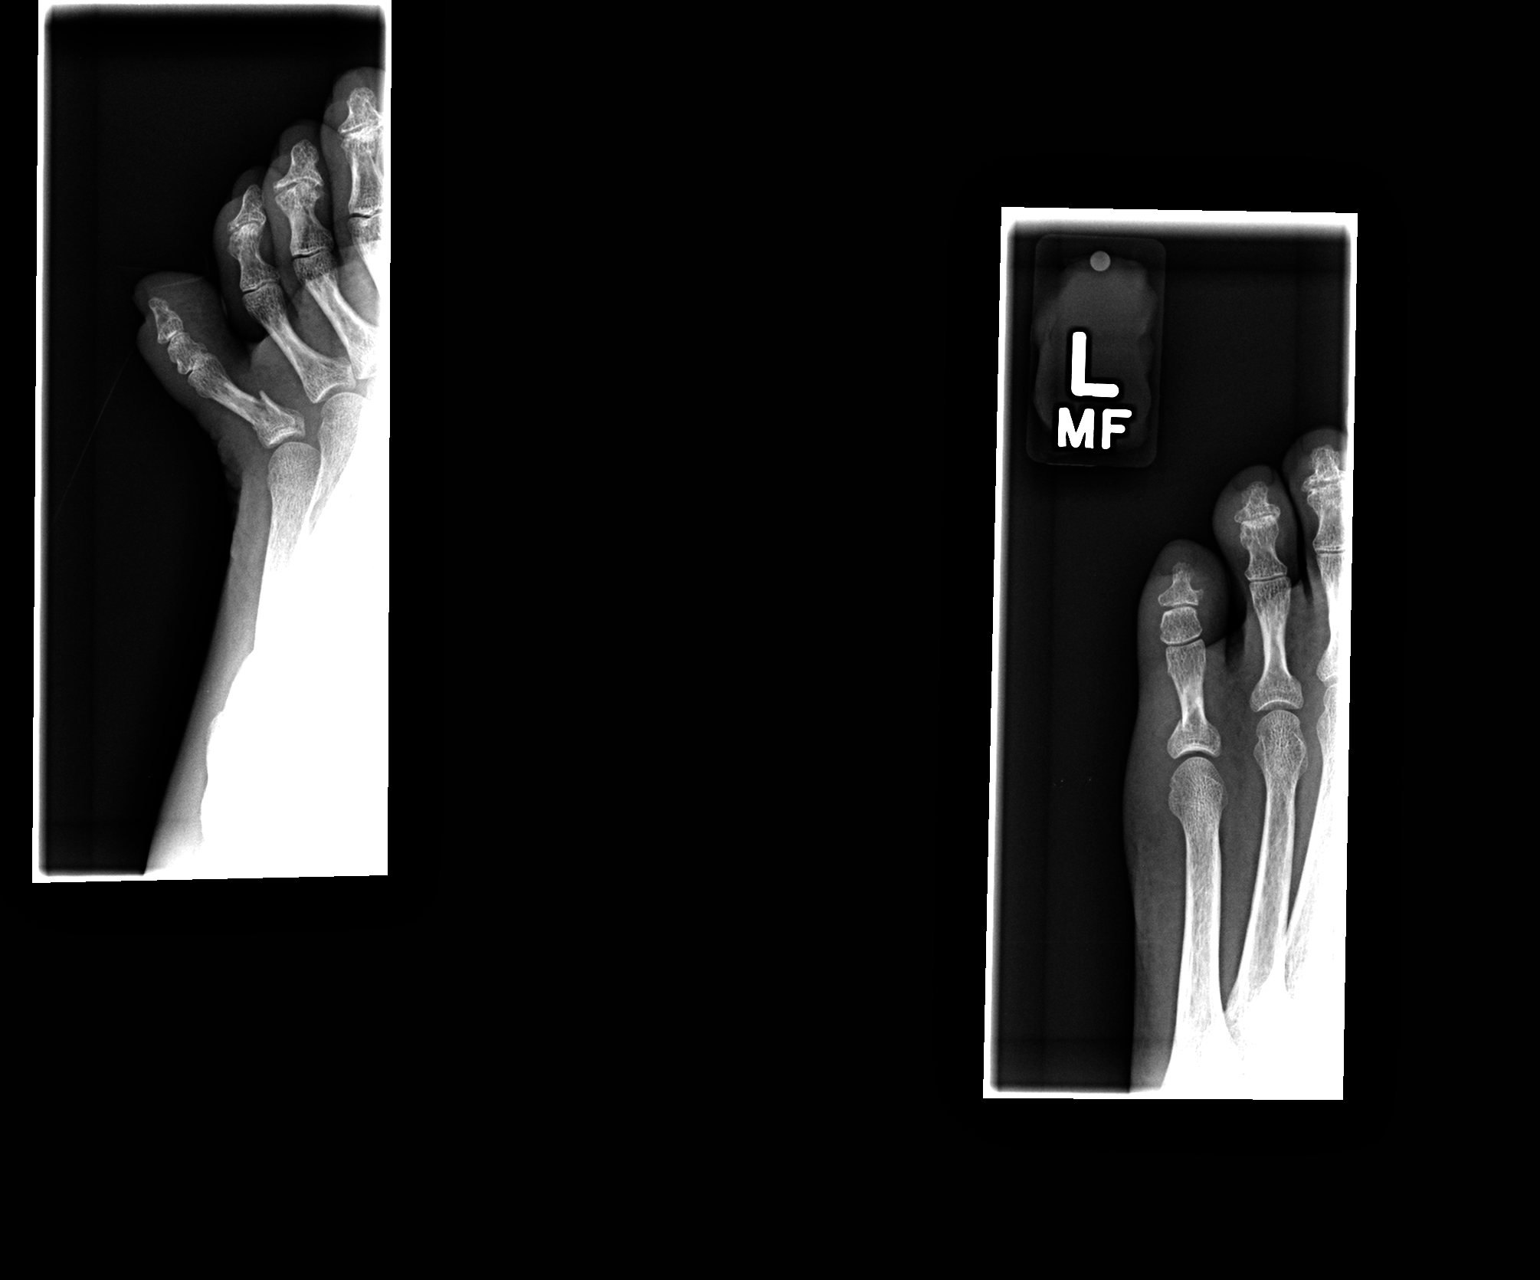

[view not recorded (2 of 2)]
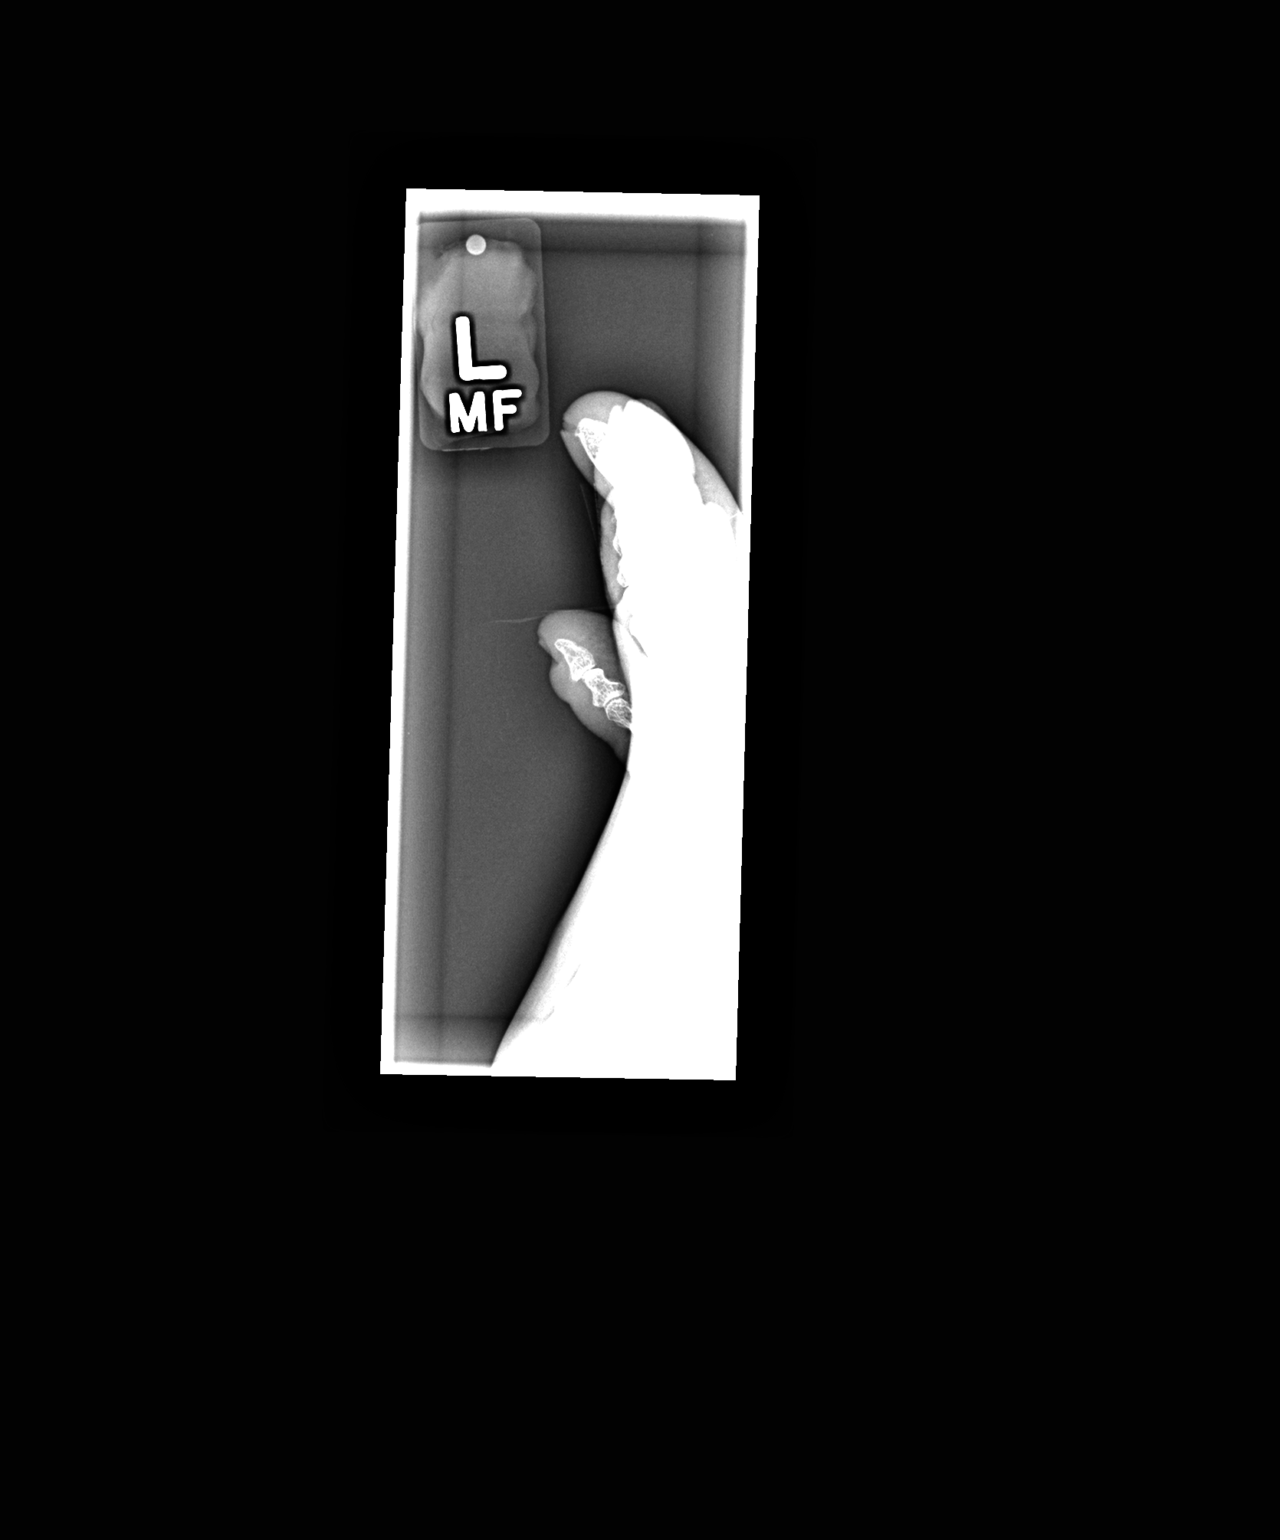

[2 of 2 positions shown; findings below may reference images not displayed]

FINDINGS: There is a fracture at the base of the proximal phalanx
of the fifth digit with mild dorsal angulation.
IMPRESSION: Fracture at the base of the proximal phalanx of the fifth digit.

## 2013-07-24 ENCOUNTER — Other Ambulatory Visit: Payer: Self-pay | Admitting: *Deleted

## 2013-07-24 MED ORDER — ZOLPIDEM TARTRATE 5 MG PO TABS: ORAL_TABLET | ORAL | Status: DC

## 2013-07-31 ENCOUNTER — Ambulatory Visit (INDEPENDENT_AMBULATORY_CARE_PROVIDER_SITE_OTHER): Payer: PRIVATE HEALTH INSURANCE | Admitting: Licensed Clinical Social Worker

## 2013-07-31 DIAGNOSIS — IMO0002 Reserved for concepts with insufficient information to code with codable children: Secondary | ICD-10-CM

## 2013-07-31 DIAGNOSIS — F411 Generalized anxiety disorder: Secondary | ICD-10-CM

## 2013-08-03 ENCOUNTER — Other Ambulatory Visit: Payer: Self-pay | Admitting: Family Medicine

## 2013-08-08 ENCOUNTER — Other Ambulatory Visit: Payer: Self-pay | Admitting: *Deleted

## 2013-08-08 MED ORDER — TRAMADOL HCL 50 MG PO TABS: ORAL_TABLET | ORAL | Status: DC

## 2013-08-14 ENCOUNTER — Ambulatory Visit: Payer: PRIVATE HEALTH INSURANCE | Admitting: Licensed Clinical Social Worker

## 2013-08-15 ENCOUNTER — Ambulatory Visit (INDEPENDENT_AMBULATORY_CARE_PROVIDER_SITE_OTHER): Payer: PRIVATE HEALTH INSURANCE | Admitting: Family Medicine

## 2013-08-15 ENCOUNTER — Encounter: Payer: Self-pay | Admitting: Family Medicine

## 2013-08-15 VITALS — BP 113/52 | HR 93 | Wt 129.0 lb

## 2013-08-15 DIAGNOSIS — K5900 Constipation, unspecified: Secondary | ICD-10-CM

## 2013-08-15 DIAGNOSIS — M25579 Pain in unspecified ankle and joints of unspecified foot: Secondary | ICD-10-CM

## 2013-08-15 DIAGNOSIS — F3289 Other specified depressive episodes: Secondary | ICD-10-CM

## 2013-08-15 DIAGNOSIS — G2581 Restless legs syndrome: Secondary | ICD-10-CM

## 2013-08-15 DIAGNOSIS — F329 Major depressive disorder, single episode, unspecified: Secondary | ICD-10-CM

## 2013-08-15 DIAGNOSIS — J45909 Unspecified asthma, uncomplicated: Secondary | ICD-10-CM

## 2013-08-15 DIAGNOSIS — K5909 Other constipation: Secondary | ICD-10-CM

## 2013-08-15 DIAGNOSIS — J4540 Moderate persistent asthma, uncomplicated: Secondary | ICD-10-CM

## 2013-08-15 MED ORDER — BUPROPION HCL ER (XL) 150 MG PO TB24: 450.0000 mg | ORAL_TABLET | ORAL | Status: DC

## 2013-08-15 NOTE — Progress Notes (Signed)
Subjective:    Patient ID: Robin Conrad, female    DOB: 1951-01-25, 63 y.o.   MRN: [id number]  HPI Here for followup depression and anxiety-she's doing well on her current regimen. Currently on Celexa and Wellbutrin. Still active in counseling and therapy. Does complain of little interest or pleasure in doing things several days of the week after falling down occasionally. She does complain of low energy and feeling bad about herself several days a week.  Still taking care of her grand daughter.   Chronic constipation - The amitiza didn't seem to help. She was on it for a month. Now using Smooth -move which has senna in it, which is a tea and it has really helped. She is using it every 2-3 days but it doesn't seem to help.    Usually sleeps on her back propped up to help with her GERD and her post nasal drip. Says her toes will hreally hurt when she wakes up. They feels sore and painful to walk initially.  She thinks may have RLS. Says feels like her nerve ending in her legs are going at night and feels like has to move her legs to get comfortable when trying to go to sleep.    Has noticed more post nasal drip and change in her voice.  She has GERD. Occ drinks coffee. Tries to avoid acidic foods.   She is on Prevacid.   Asthma - under fair control. Last 2 weeks she's had used her up-year-old 6 times. She is using her Symbicort when she gets to the very bottom and the child gets into the yellow range she feels like it's not nearly as effective. She does shake it and use it appropriately.  Review of Systems     Objective:   Physical Exam  Constitutional: She is oriented to person, place, and time. She appears well-developed and well-nourished.  HENT:  Head: Normocephalic and atraumatic.  Cardiovascular: Normal rate, regular rhythm and normal heart sounds.   Pulmonary/Chest: Effort normal and breath sounds normal.  Neurological: She is alert and oriented to person, place, and time.  Skin: Skin  is warm and dry.  Psychiatric: She has a normal mood and affect. Her behavior is normal.          Assessment & Plan:  Depression/anxiety-PHQ 9 score of 5 today. Under fair control. I would like to see her score 4 or less but she feels like she's doing well overall. She would like to continue her current regimen and does need refills on her Wellbutrin today.  Chronic constipation - Recommend adding a softerner to her regimen so not having to to use the senna as frequently. Will still having some IBS sxs but overall looking to reduce the number of days with symptoms.  Continue to work on high-fiber diet with plenty of water.  Asthma - moderate, persistent doing well on Symbicort. Continue use albuterol as needed 2-4 puffs. If she starts using it daily then consider a course of steroids. She also continues to take her Zyrtec and to nasal for allergies.  Foot pain-since she is about super recommend putting a pillow underneath her knees to increase flexion of the knees and this will help change in position of the feet. I think this will make a difference with the soreness in the mornings.  Based on her symptoms it does sound like she may have restless leg syndrome. The main treatment options include medication. She wants to hold off on this at  this time.

## 2013-08-20 ENCOUNTER — Other Ambulatory Visit: Payer: Self-pay | Admitting: Critical Care Medicine

## 2013-08-22 ENCOUNTER — Other Ambulatory Visit: Payer: Self-pay

## 2013-08-22 MED ORDER — ZOLPIDEM TARTRATE 5 MG PO TABS: ORAL_TABLET | ORAL | Status: DC

## 2013-08-22 MED ORDER — CYCLOBENZAPRINE HCL 10 MG PO TABS: 10.0000 mg | ORAL_TABLET | Freq: Every day | ORAL | Status: DC

## 2013-09-04 ENCOUNTER — Other Ambulatory Visit: Payer: Self-pay | Admitting: Family Medicine

## 2013-09-04 ENCOUNTER — Ambulatory Visit (INDEPENDENT_AMBULATORY_CARE_PROVIDER_SITE_OTHER): Payer: PRIVATE HEALTH INSURANCE | Admitting: Licensed Clinical Social Worker

## 2013-09-04 DIAGNOSIS — Z1231 Encounter for screening mammogram for malignant neoplasm of breast: Secondary | ICD-10-CM

## 2013-09-04 DIAGNOSIS — IMO0002 Reserved for concepts with insufficient information to code with codable children: Secondary | ICD-10-CM

## 2013-09-04 DIAGNOSIS — F411 Generalized anxiety disorder: Secondary | ICD-10-CM

## 2013-09-06 ENCOUNTER — Other Ambulatory Visit: Payer: Self-pay

## 2013-09-06 MED ORDER — TRAMADOL HCL 50 MG PO TABS: ORAL_TABLET | ORAL | Status: DC

## 2013-09-13 ENCOUNTER — Ambulatory Visit: Payer: Self-pay

## 2013-09-17 ENCOUNTER — Other Ambulatory Visit: Payer: Self-pay | Admitting: Family Medicine

## 2013-09-19 ENCOUNTER — Ambulatory Visit (HOSPITAL_BASED_OUTPATIENT_CLINIC_OR_DEPARTMENT_OTHER)
Admission: RE | Admit: 2013-09-19 | Discharge: 2013-09-19 | Disposition: A | Discharge status: Home / Self Care | Payer: PRIVATE HEALTH INSURANCE | Source: Ambulatory Visit | Attending: Family Medicine | Admitting: Family Medicine

## 2013-09-19 DIAGNOSIS — Z1231 Encounter for screening mammogram for malignant neoplasm of breast: Secondary | ICD-10-CM | POA: Insufficient documentation

## 2013-09-19 IMAGING — MG MM DIGITAL SCREENING
4 series · 4 of 4 positions shown · non-contrast
Comparison: Previous exam(s).

CLINICAL DATA: Screening.

EXAM:
DIGITAL SCREENING BILATERAL MAMMOGRAM WITH CAD

[R CC]
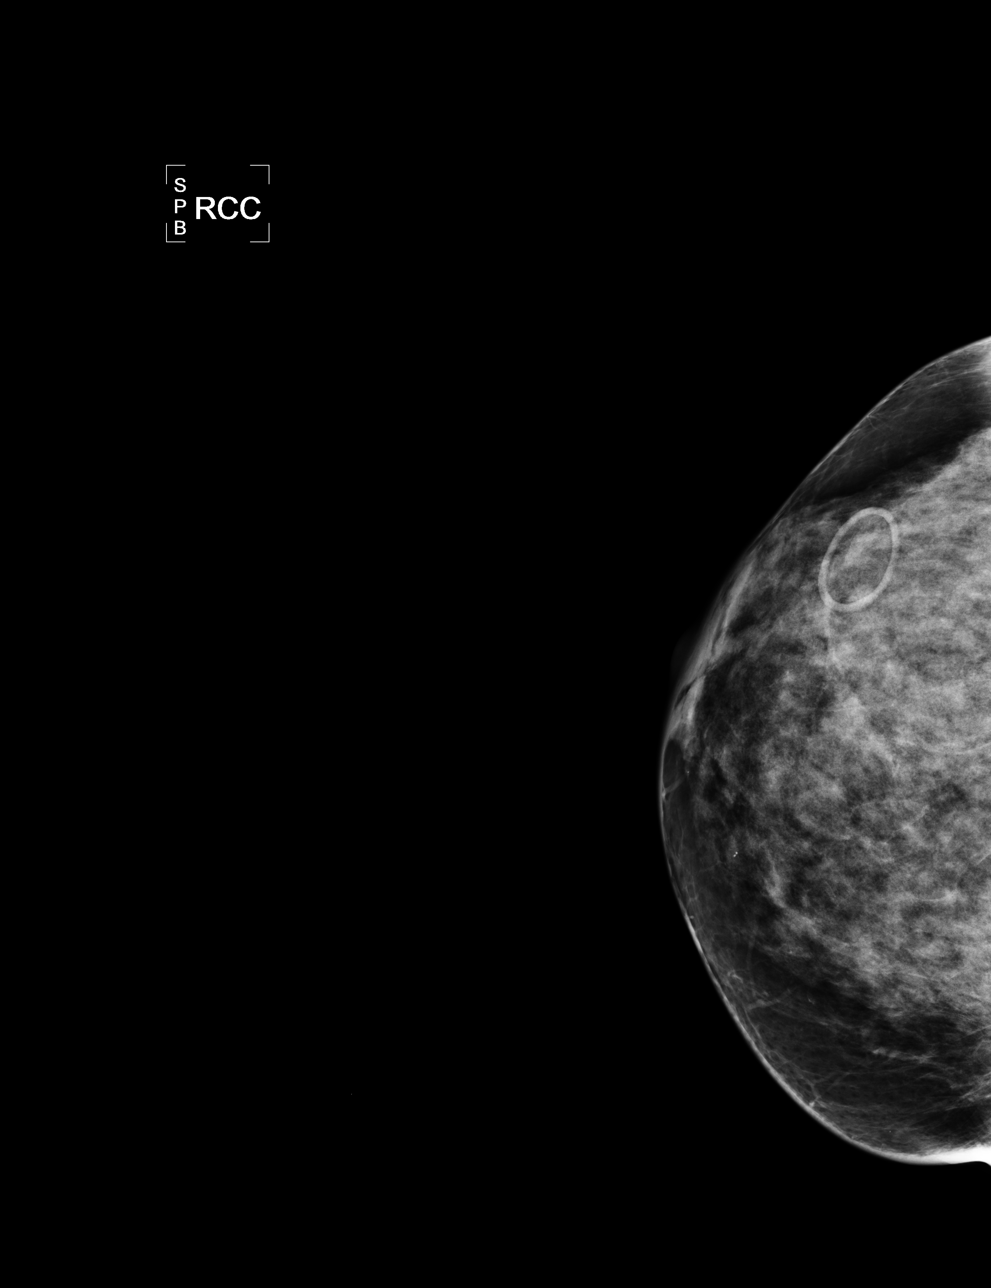

[L CC]
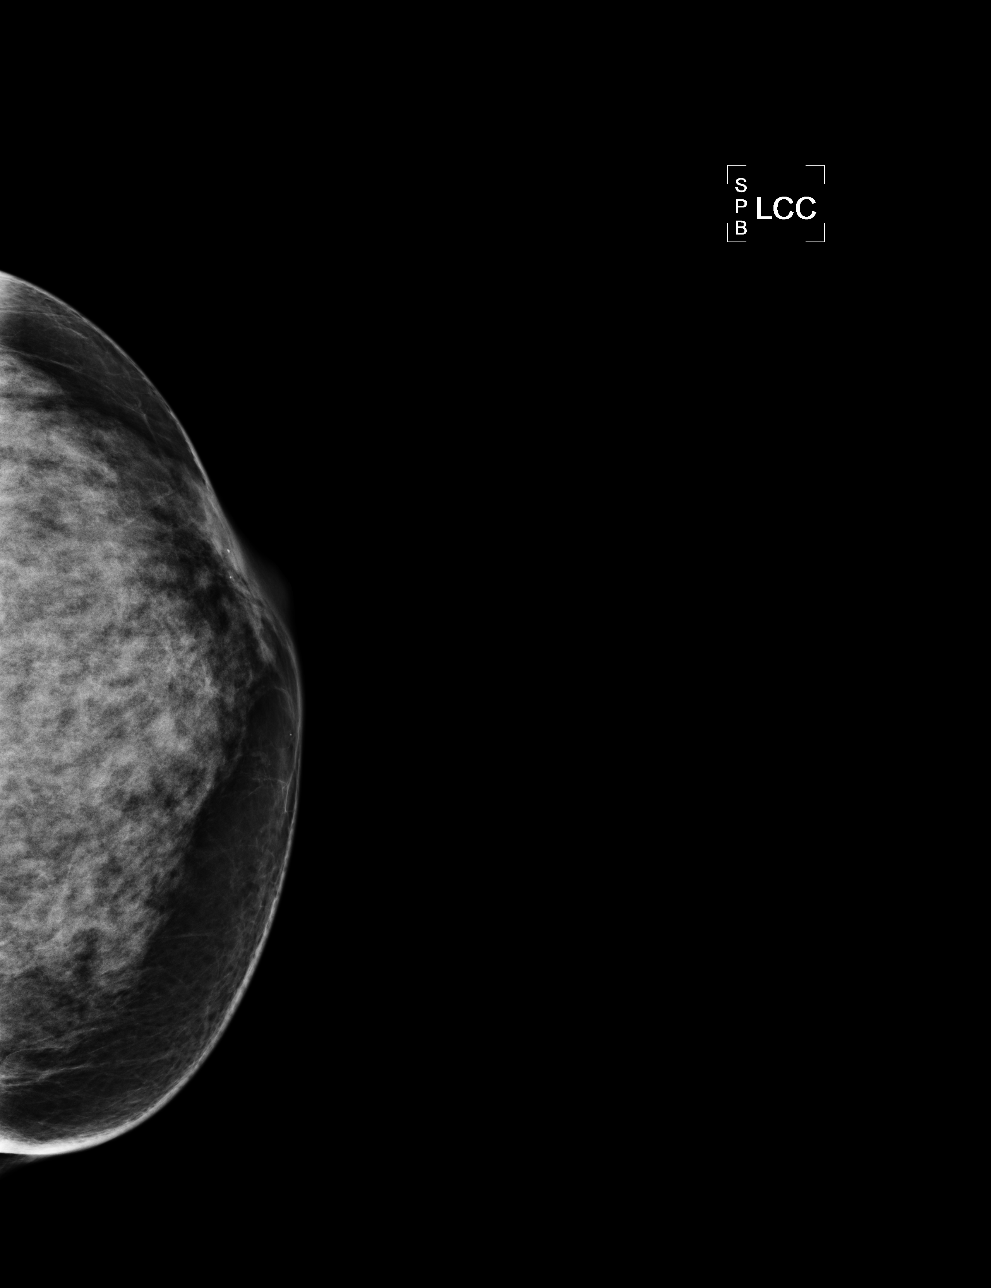

[L MLO]
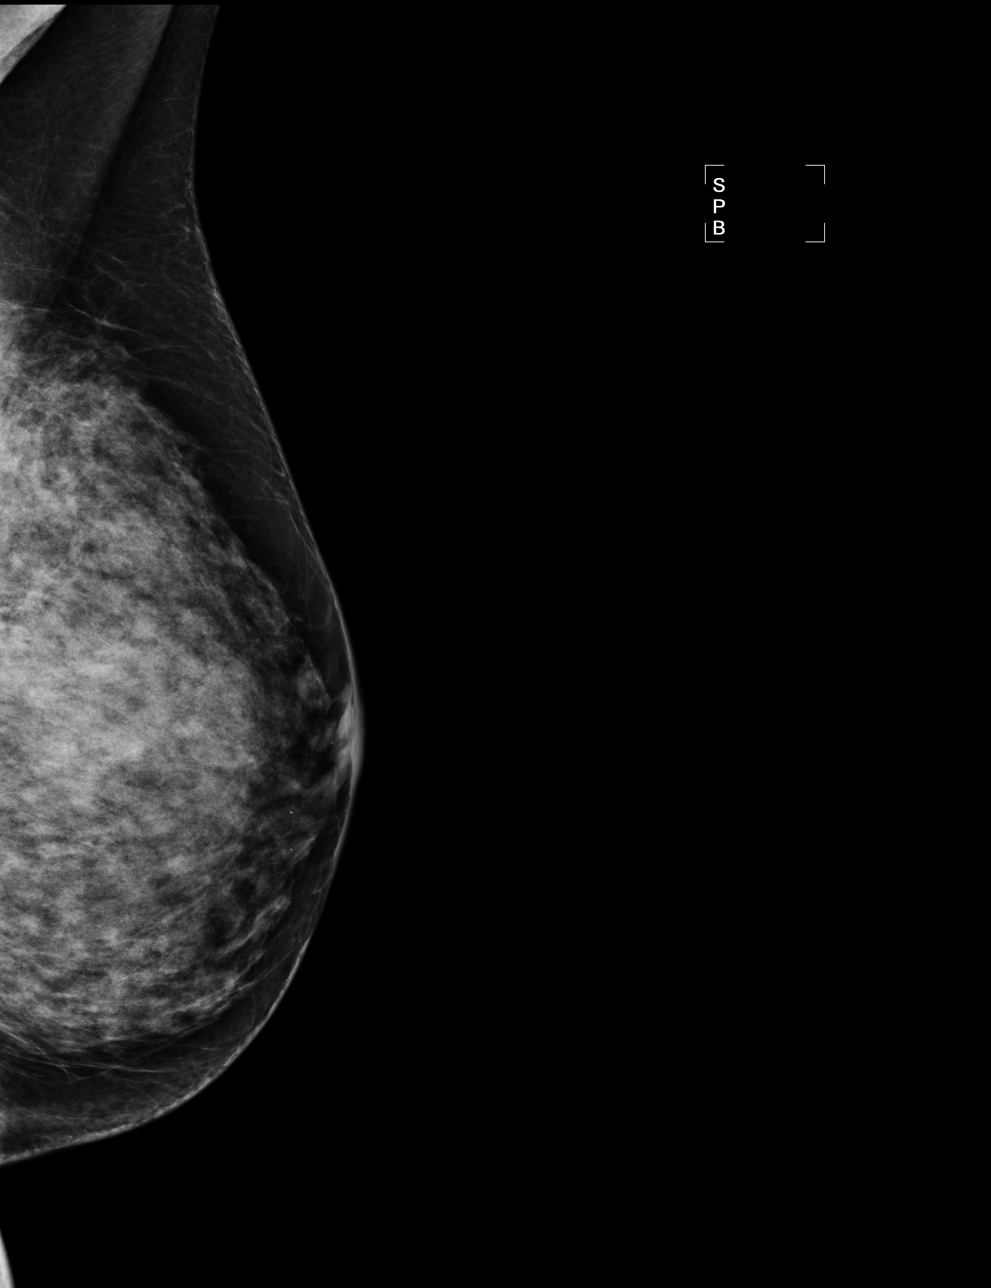

[R MLO]
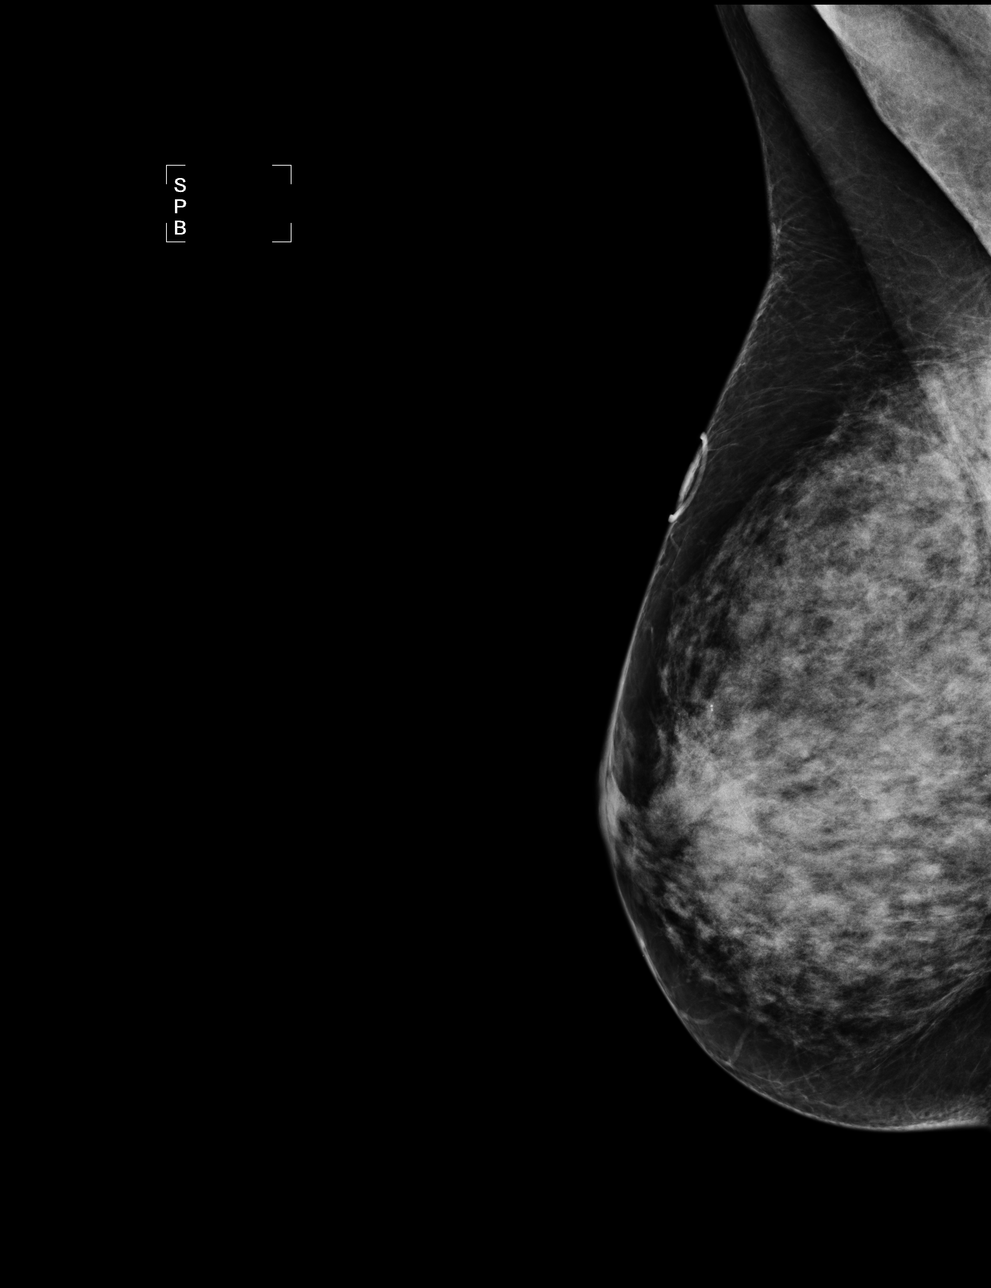

[4 of 4 positions shown; findings below may reference images not displayed]

ACR Breast Density Category c: The breast tissue is heterogeneously
dense, which may obscure small masses.
FINDINGS: There are no findings suspicious for malignancy. Images were
processed with CAD.
IMPRESSION: No mammographic evidence of malignancy. A result letter of this
screening mammogram will be mailed directly to the patient.

RECOMMENDATION:
Screening mammogram in one year. (Code:[0J])

BI-RADS CATEGORY  1: Negative.

## 2013-09-20 ENCOUNTER — Ambulatory Visit (INDEPENDENT_AMBULATORY_CARE_PROVIDER_SITE_OTHER): Payer: PRIVATE HEALTH INSURANCE | Admitting: Licensed Clinical Social Worker

## 2013-09-20 DIAGNOSIS — F411 Generalized anxiety disorder: Secondary | ICD-10-CM

## 2013-09-20 DIAGNOSIS — IMO0002 Reserved for concepts with insufficient information to code with codable children: Secondary | ICD-10-CM

## 2013-09-21 ENCOUNTER — Other Ambulatory Visit: Payer: Self-pay | Admitting: *Deleted

## 2013-09-21 MED ORDER — ZOLPIDEM TARTRATE 5 MG PO TABS: ORAL_TABLET | ORAL | Status: DC

## 2013-09-21 NOTE — Telephone Encounter (Signed)
Zolpidem refilled for 30 days no refills. Margette Fast, CMA

## 2013-10-02 ENCOUNTER — Ambulatory Visit (INDEPENDENT_AMBULATORY_CARE_PROVIDER_SITE_OTHER): Payer: PRIVATE HEALTH INSURANCE | Admitting: Licensed Clinical Social Worker

## 2013-10-02 DIAGNOSIS — IMO0002 Reserved for concepts with insufficient information to code with codable children: Secondary | ICD-10-CM

## 2013-10-02 DIAGNOSIS — F411 Generalized anxiety disorder: Secondary | ICD-10-CM

## 2013-10-10 ENCOUNTER — Other Ambulatory Visit: Payer: Self-pay | Admitting: *Deleted

## 2013-10-10 MED ORDER — TRAMADOL HCL 50 MG PO TABS: ORAL_TABLET | ORAL | Status: DC

## 2013-10-16 ENCOUNTER — Ambulatory Visit (INDEPENDENT_AMBULATORY_CARE_PROVIDER_SITE_OTHER): Payer: PRIVATE HEALTH INSURANCE | Admitting: Licensed Clinical Social Worker

## 2013-10-16 ENCOUNTER — Other Ambulatory Visit: Payer: Self-pay | Admitting: Family Medicine

## 2013-10-16 DIAGNOSIS — F411 Generalized anxiety disorder: Secondary | ICD-10-CM

## 2013-10-16 DIAGNOSIS — IMO0002 Reserved for concepts with insufficient information to code with codable children: Secondary | ICD-10-CM

## 2013-10-22 ENCOUNTER — Telehealth: Payer: Self-pay | Admitting: *Deleted

## 2013-10-22 ENCOUNTER — Ambulatory Visit (INDEPENDENT_AMBULATORY_CARE_PROVIDER_SITE_OTHER): Payer: PRIVATE HEALTH INSURANCE | Admitting: Family Medicine

## 2013-10-22 ENCOUNTER — Encounter: Payer: Self-pay | Admitting: Family Medicine

## 2013-10-22 VITALS — BP 103/57 | HR 99 | Wt 130.0 lb

## 2013-10-22 DIAGNOSIS — M79641 Pain in right hand: Secondary | ICD-10-CM

## 2013-10-22 DIAGNOSIS — M79645 Pain in left finger(s): Secondary | ICD-10-CM

## 2013-10-22 DIAGNOSIS — M79609 Pain in unspecified limb: Secondary | ICD-10-CM

## 2013-10-22 MED ORDER — DICLOFENAC SODIUM 1 % TD GEL: 2.0000 g | Freq: Four times a day (QID) | TRANSDERMAL | Status: DC

## 2013-10-22 NOTE — Telephone Encounter (Signed)
Pt called about hand pt. Transferred up front to schedule an appt.Audelia Hives [place]

## 2013-10-22 NOTE — Patient Instructions (Addendum)
Add Dulcolax every other day.  Increase Ibuprofen to 600 or 800 mg in the morning.
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
## 2013-10-23 ENCOUNTER — Other Ambulatory Visit: Payer: Self-pay

## 2013-10-23 MED ORDER — ZOLPIDEM TARTRATE 5 MG PO TABS: ORAL_TABLET | ORAL | Status: DC

## 2013-11-01 ENCOUNTER — Other Ambulatory Visit: Payer: Self-pay | Admitting: Family Medicine

## 2013-11-07 ENCOUNTER — Other Ambulatory Visit: Payer: Self-pay | Admitting: Family Medicine

## 2013-11-15 ENCOUNTER — Ambulatory Visit (INDEPENDENT_AMBULATORY_CARE_PROVIDER_SITE_OTHER): Payer: PRIVATE HEALTH INSURANCE | Admitting: Family Medicine

## 2013-11-15 ENCOUNTER — Encounter: Payer: Self-pay | Admitting: Family Medicine

## 2013-11-15 ENCOUNTER — Ambulatory Visit: Payer: Self-pay | Admitting: Family Medicine

## 2013-11-15 VITALS — BP 110/52 | HR 96 | Wt 129.0 lb

## 2013-11-15 DIAGNOSIS — J45909 Unspecified asthma, uncomplicated: Secondary | ICD-10-CM

## 2013-11-15 DIAGNOSIS — F329 Major depressive disorder, single episode, unspecified: Secondary | ICD-10-CM

## 2013-11-15 DIAGNOSIS — J4540 Moderate persistent asthma, uncomplicated: Secondary | ICD-10-CM

## 2013-11-15 DIAGNOSIS — F3289 Other specified depressive episodes: Secondary | ICD-10-CM

## 2013-11-15 DIAGNOSIS — K5900 Constipation, unspecified: Secondary | ICD-10-CM

## 2013-11-15 DIAGNOSIS — Z23 Encounter for immunization: Secondary | ICD-10-CM

## 2013-11-15 DIAGNOSIS — M79609 Pain in unspecified limb: Secondary | ICD-10-CM

## 2013-11-15 DIAGNOSIS — M79645 Pain in left finger(s): Secondary | ICD-10-CM

## 2013-11-15 DIAGNOSIS — K5909 Other constipation: Secondary | ICD-10-CM

## 2013-11-15 MED ORDER — TRAMADOL HCL 50 MG PO TABS: ORAL_TABLET | ORAL | Status: DC

## 2013-11-15 NOTE — Progress Notes (Signed)
   Subjective:    Patient ID: Robin Conrad, female    DOB: 10/02/1950, 63 y.o.   MRN: [id number]  HPI Depression /Anxiety - Stable no recent change in symptoms. On citalopram 10 mg. She takes care of her granddaughter on a regular basis. She uses Xanax as needed.  Constipation - bowels are moving better. She is using her laxative and softener and has increased fiber.  For now things have been better.  Asthma - Says her allergies have been well controlled over the summer. Feels like her asthma has been well controlled.  Went 7 weeks without her immunotherapy shots.   Used her rescue inhaler once in the last months.   Left thumb pain - does feel better when she wears the splint.  Picking up her 22 lbs grandchild.    Review of Systems     Objective:   Physical Exam  Constitutional: She is oriented to person, place, and time. She appears well-developed and well-nourished.  HENT:  Head: Normocephalic and atraumatic.  Cardiovascular: Normal rate, regular rhythm and normal heart sounds.   Pulmonary/Chest: Effort normal and breath sounds normal.  Musculoskeletal:  Left thumb with normal range of motion. Still tender at the base of the thumb and at the wrist joint.  Neurological: She is alert and oriented to person, place, and time.  Skin: Skin is warm and dry.  Psychiatric: She has a normal mood and affect. Her behavior is normal.          Assessment & Plan:  Depression/Anxiety - stable. No significant changes. Followup in about 3 months.  Constipation - improved on current regimen. Explained her that may not be perfect and she may still occasionally have some constipation but if the majority of the time her bowels are well controlled then I think we should stick with the current regimen.  Asthma - currently well controlled. Continue current regimen. If she continues to do well we might even be stepped on her therapy at her next office visit.  Left thumb arthritis-I. would like her to  see our sports medicine physician for further treatment options. The splint does seem to help but still not healing well.   Flu vaccine given today.

## 2013-11-15 NOTE — Patient Instructions (Signed)
Please schedule an appointment with Dr. Aundria Mems for your thumb. He is or sports medicine doctor.

## 2013-11-20 ENCOUNTER — Ambulatory Visit (INDEPENDENT_AMBULATORY_CARE_PROVIDER_SITE_OTHER): Payer: PRIVATE HEALTH INSURANCE

## 2013-11-20 ENCOUNTER — Other Ambulatory Visit: Payer: Self-pay | Admitting: *Deleted

## 2013-11-20 ENCOUNTER — Ambulatory Visit (INDEPENDENT_AMBULATORY_CARE_PROVIDER_SITE_OTHER): Payer: PRIVATE HEALTH INSURANCE | Admitting: Sports Medicine

## 2013-11-20 VITALS — BP 137/76 | HR 104 | Ht 66.0 in | Wt 129.0 lb

## 2013-11-20 DIAGNOSIS — M19042 Primary osteoarthritis, left hand: Secondary | ICD-10-CM

## 2013-11-20 DIAGNOSIS — M25539 Pain in unspecified wrist: Secondary | ICD-10-CM

## 2013-11-20 DIAGNOSIS — M25532 Pain in left wrist: Secondary | ICD-10-CM

## 2013-11-20 DIAGNOSIS — M79609 Pain in unspecified limb: Secondary | ICD-10-CM

## 2013-11-20 DIAGNOSIS — M79641 Pain in right hand: Secondary | ICD-10-CM

## 2013-11-20 DIAGNOSIS — M19032 Primary osteoarthritis, left wrist: Secondary | ICD-10-CM | POA: Insufficient documentation

## 2013-11-20 DIAGNOSIS — M19041 Primary osteoarthritis, right hand: Secondary | ICD-10-CM | POA: Insufficient documentation

## 2013-11-20 IMAGING — CR DG HAND COMPLETE 3+V*R*
3 series · 3 of 3 positions shown · non-contrast
Comparison: None.

CLINICAL DATA: Pain.

EXAM:
RIGHT HAND - COMPLETE 3+ VIEW

[view not recorded (1 of 3)]
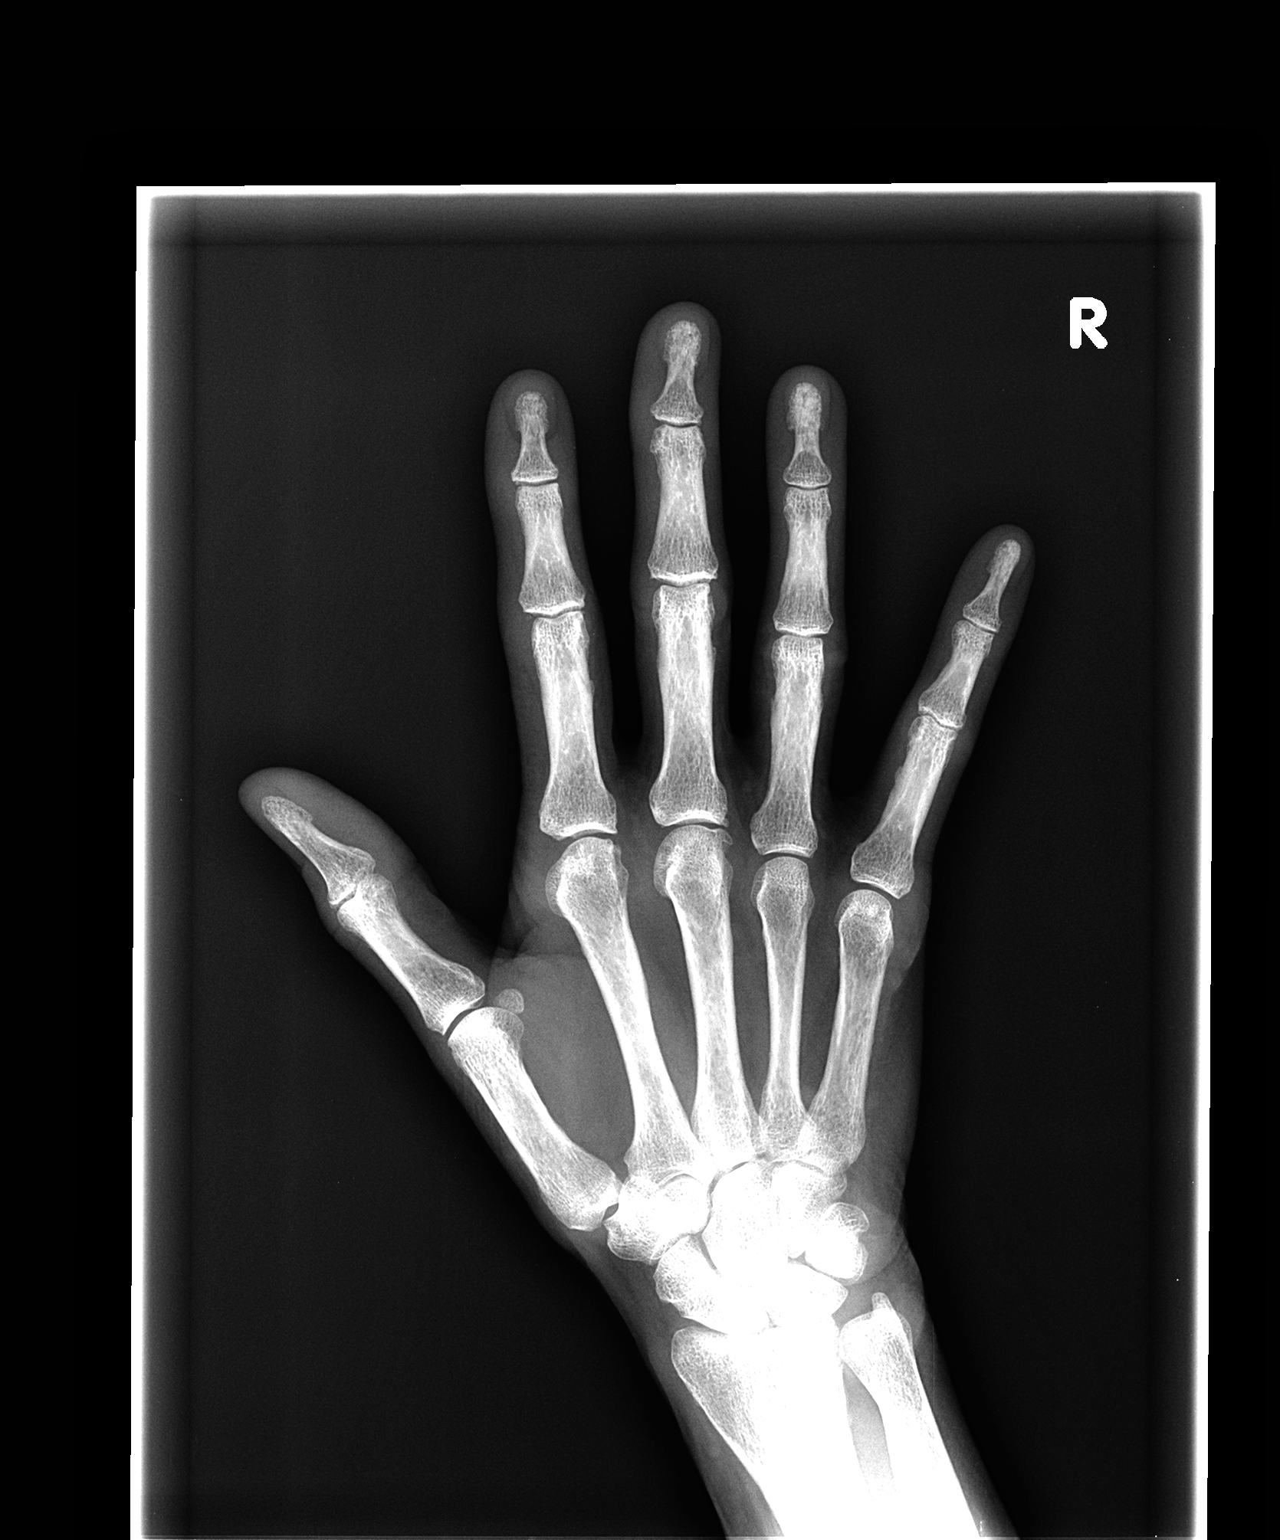

[view not recorded (2 of 3)]
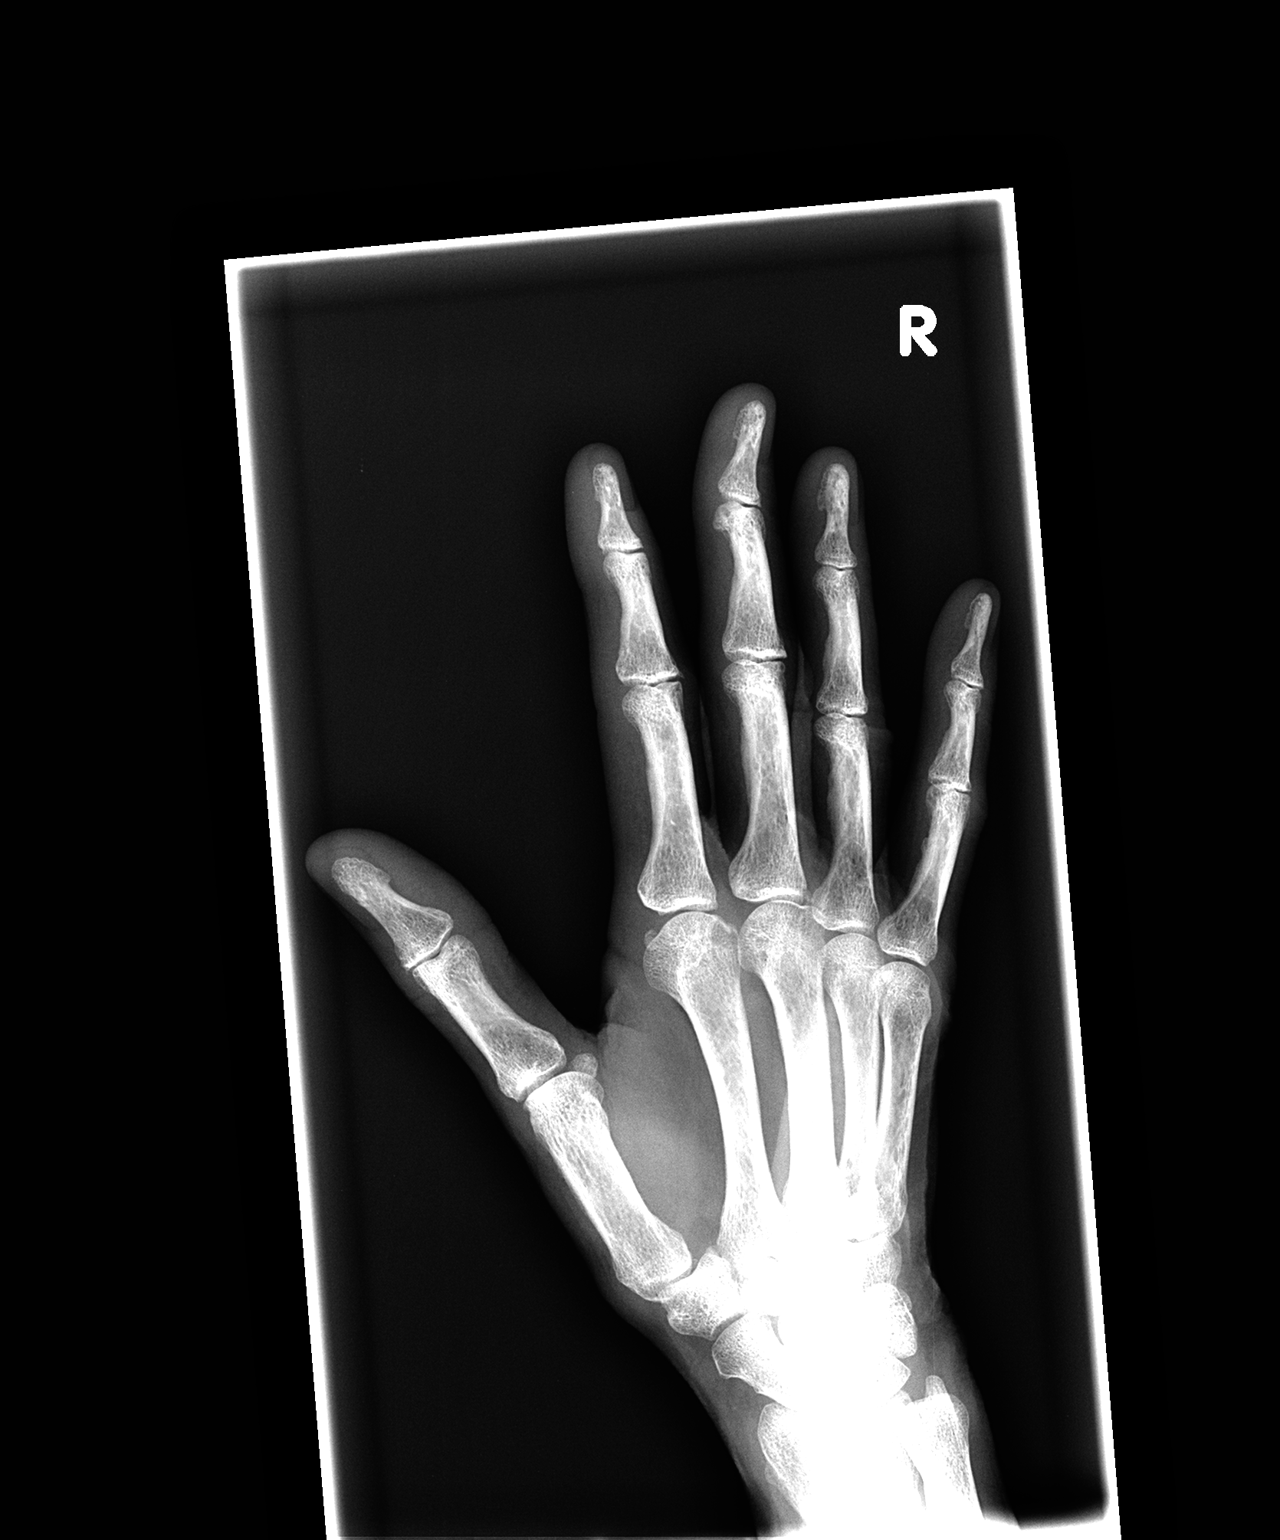

[view not recorded (3 of 3)]
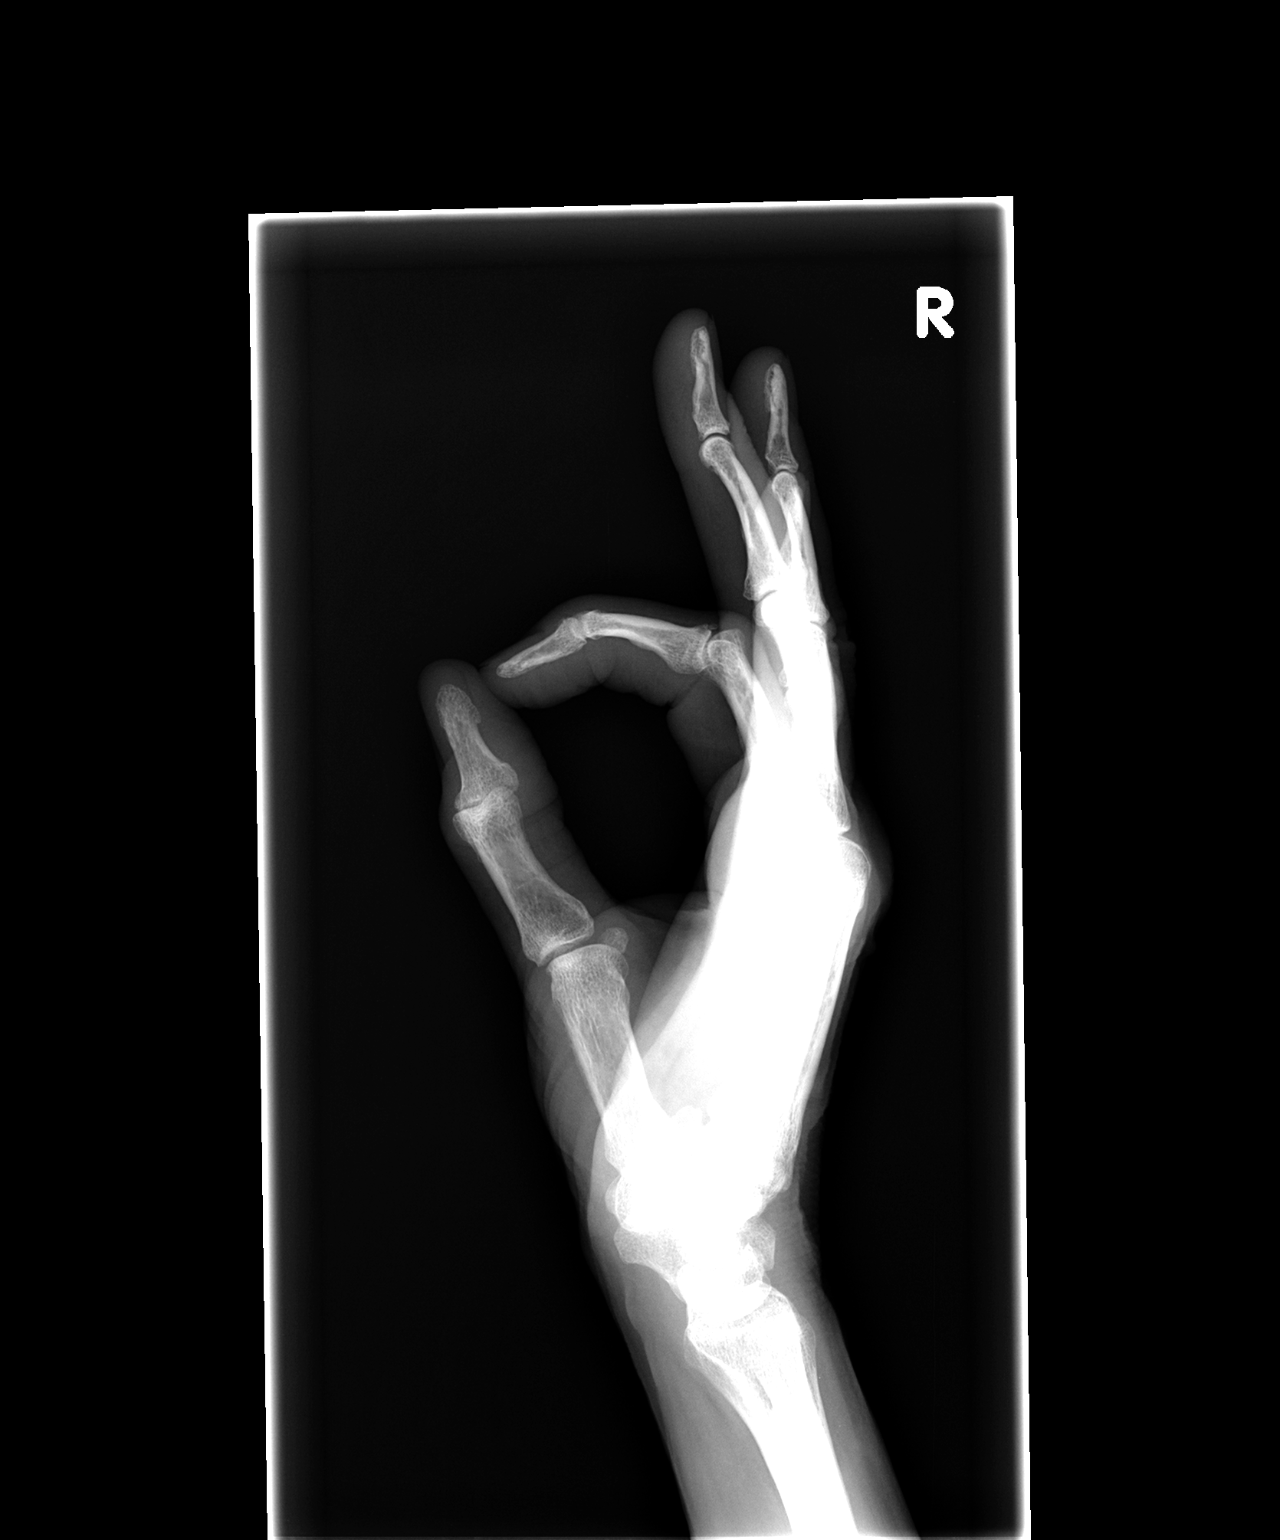

[3 of 3 positions shown; findings below may reference images not displayed]

FINDINGS: There is no evidence of fracture or dislocation. There is no
evidence of arthropathy or other focal bone abnormality. Soft
tissues are unremarkable.
IMPRESSION: Negative.

## 2013-11-20 MED ORDER — DICLOFENAC SODIUM 75 MG PO TBEC: 75.0000 mg | DELAYED_RELEASE_TABLET | Freq: Two times a day (BID) | ORAL | Status: DC

## 2013-11-20 MED ORDER — DICLOFENAC SODIUM 2 % TD SOLN
2.0000 | Freq: Two times a day (BID) | TRANSDERMAL | Status: DC
Start: 2013-11-20 — End: 2014-12-24

## 2013-11-20 MED ORDER — ZOLPIDEM TARTRATE 5 MG PO TABS: ORAL_TABLET | ORAL | Status: DC

## 2013-11-20 NOTE — Assessment & Plan Note (Signed)
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 Voltaren gel, oral diclofenac, x-rays. Return, interphalangeal joint injections under guidance if no better.

## 2013-11-20 NOTE — Assessment & Plan Note (Signed)
Pain is predominantly at the radiocarpal joint, likely represents osteoarthritis. X-rays, diclofenac gel, oral Voltaren. Return to see me in 2 weeks, injection if no better.

## 2013-11-20 NOTE — Progress Notes (Signed)
Patient ID: Robin Conrad, female   DOB: 07-19-1950, 63 y.o.   MRN: [id number]   Subjective:    I'm seeing this patient as a consultation for: Robin Lecher, MD  CC: Left wrist pain, right hand pain  HPI: Robin Conrad is a very pleasant 63 year old female with history of fibromyalgia who presents with worsening chronic pain in the right hand and two months of new-onset pain in the left wrist. She states that the pain in the right hand is most significant over the fingers at the proximal interphalangeal joints and is worse with squeezing the hand, lifting heavy objects or with fine motions such as typing. This pain is often accompanied by swelling and improves with rest/keeping her fingers straight. She also reports significant pain in the left wrist at the base of the thumb, and states that this is her main concern today. Pain is worse with lifting heavy objects and wrist flexion/extension. No acute injury to the wrist, but she does report that she has been caring for her 40-month-old granddaughter with cerebral palsy. Currently on a regimen of tramadol and 400 mg of ibuprofen daily for fibromyalgia, and has attempted to increase this dose to 600 mg of ibuprofen daily per Dr. Gardiner Ramus suggestion, but reports being very busy and taking the medicine infrequently. She is currently wearing a brace on the left wrist and has tried 1% Voltaren gel, but neither intervention has provided relief. No weakness, numbness, tingling, or changes in sensation in either hand.   Past medical history, Surgical history, Family history not pertinant except as noted below, Social history, Allergies, and medications have been entered into the medical record, reviewed, and no changes needed.   Review of Systems: No headache, visual changes, nausea, vomiting, diarrhea, constipation, dizziness, abdominal pain, skin rash, fevers, chills, night sweats, weight loss, swollen lymph nodes, body aches, muscle aches, chest pain,  shortness of breath, mood changes, visual or auditory hallucinations.   Objective:   General: Well developed, well nourished, and in no acute distress.  Neuro/Psych: Alert and oriented x3, extra-ocular muscles intact, able to move all 4 extremities, sensation grossly intact. Skin: Warm and dry, no rashes noted.  Respiratory: Not using accessory muscles, speaking in full sentences, trachea midline.  Cardiovascular: Pulses palpable, no extremity edema. Abdomen: Does not appear distended.  Right Hand: Inspection with no visible deformity, asymmetry, erythema, atrophy, or skin changes.  Minimal swelling present over the 2nd through 5th PIP joints. Full strength and range of motion throughout. Neurovascularly intact with 2+ radial pulses.  Left Wrist: Inspection normal with no visible erythema or swelling. Active ROM limited to pain with extension at the wrist. Palpation is normal over metacarpals, navicular, lunate, and TFCC; tendons without tenderness or swelling. Tenderness to palpation at the radiocarpal joint. Reproducible pain at the radiocarpal joint with forced extension and flexion. No snuffbox tenderness. No tenderness over Canal of Gu[place]. Strength limited with resisted extension, normal in all other directions. Slightly positive Wynn Maudlin. Negative tinel's and phalens. Negative Watson's test.  Impression and Recommendations:   This case required medical decision making of moderate complexity. Left wrist pain: Osteoarthritis is suggested in this patient with pain with use of the wrist, most significant at the radiocarpal joint.  - X-rays - 2% diclofenac gel  - Oral diclofenac - Return in 2 weeks with plan to inject the radiocarpal joint if no improvement.  Right hand pain: Osteoarthritis is suggested in this patient with pain of the proximal interphalangeal joints. - X-rays - 2% diclofenac gel -  Oral diclofenac - Return in 2 weeks with plan to injcet the interphalangeal  joints if no improvement.

## 2013-11-29 ENCOUNTER — Ambulatory Visit (INDEPENDENT_AMBULATORY_CARE_PROVIDER_SITE_OTHER): Payer: PRIVATE HEALTH INSURANCE | Admitting: Licensed Clinical Social Worker

## 2013-11-29 DIAGNOSIS — F411 Generalized anxiety disorder: Secondary | ICD-10-CM

## 2013-11-29 DIAGNOSIS — IMO0002 Reserved for concepts with insufficient information to code with codable children: Secondary | ICD-10-CM

## 2013-11-30 ENCOUNTER — Telehealth: Payer: Self-pay | Admitting: *Deleted

## 2013-11-30 NOTE — Telephone Encounter (Signed)
Recommend a trial of magnesium citrate. Please follow the instructions on the bottle. May also use an over-the-counter suppository or enema to get things going. Repeat following day if still no bowel movement.

## 2013-11-30 NOTE — Telephone Encounter (Signed)
Robin Conrad calls and states that it has been 9 days since she has moved her bowels. She has exerted all measures except mag citrate and prune juice. Please advise. Margette Fast, CMA

## 2013-12-03 NOTE — Telephone Encounter (Signed)
Spoke with patient at great length regarding bowel regimen. She said that she was going to get the mag citrate and also try some warm prune juiice. Margette Fast, CMA

## 2013-12-04 ENCOUNTER — Ambulatory Visit (INDEPENDENT_AMBULATORY_CARE_PROVIDER_SITE_OTHER): Payer: PRIVATE HEALTH INSURANCE | Admitting: Sports Medicine

## 2013-12-04 ENCOUNTER — Encounter: Payer: Self-pay | Admitting: Sports Medicine

## 2013-12-04 VITALS — BP 116/67 | HR 95 | Wt 134.0 lb

## 2013-12-04 DIAGNOSIS — M79641 Pain in right hand: Secondary | ICD-10-CM

## 2013-12-04 DIAGNOSIS — M25539 Pain in unspecified wrist: Secondary | ICD-10-CM

## 2013-12-04 DIAGNOSIS — M79609 Pain in unspecified limb: Secondary | ICD-10-CM

## 2013-12-04 DIAGNOSIS — M25532 Pain in left wrist: Secondary | ICD-10-CM

## 2013-12-04 LAB — URIC ACID: Uric Acid, Serum: 3.9 mg/dL (ref 2.4–7.0)

## 2013-12-04 LAB — RHEUMATOID FACTOR: Rheumatoid fact SerPl-aCnc: <10 [IU]/mL (ref <=14)

## 2013-12-04 NOTE — Assessment & Plan Note (Signed)
Radiocarpal joint injection as above.

## 2013-12-04 NOTE — Assessment & Plan Note (Signed)
Pain at the interphalangeal joints. Also at the MCPs. Testing for rheumatoid arthritis. It's still painful at the next visit we can do interphalangeal and MCP injections.

## 2013-12-04 NOTE — Progress Notes (Signed)
  Subjective:    CC: Followup  HPI: Left wrist osteoarthritis : improved but still not better after topical and oral diclofenac. Pain is moderate, persistent and localized at the radiocarpal joint.  Right hand osteoarthritis: Localized over the MCPs and PIPs, family history of rheumatoid arthritis, has never been tested. Pain is moderate, persistent but improved with diclofenac both topical and oral.  Past medical history, Surgical history, Family history not pertinant except as noted below, Social history, Allergies, and medications have been entered into the medical record, reviewed, and no changes needed.   Review of Systems: No fevers, chills, night sweats, weight loss, chest pain, or shortness of breath.   Objective:    General: Well Developed, well nourished, and in no acute distress.  Neuro: Alert and oriented x3, extra-ocular muscles intact, sensation grossly intact.  HEENT: Normocephalic, atraumatic, pupils equal round reactive to light, neck supple, no masses, no lymphadenopathy, thyroid nonpalpable.  Skin: Warm and dry, no rashes. Cardiac: Regular rate and rhythm, no murmurs rubs or gallops, no lower extremity edema.  Respiratory: Clear to auscultation bilaterally. Not using accessory muscles, speaking in full sentences.  Procedure: Real-time Ultrasound Guided Injection of left wrist Device: GE Logiq E  Verbal informed consent obtained.  Time-out conducted.  Noted no overlying erythema, induration, or other signs of local infection.  Skin prepped in a sterile fashion.  Local anesthesia: Topical Ethyl chloride.  With sterile technique and under real time ultrasound guidance:  25-gauge needle advanced into the radiocarpal joint, 1 cc kenalog 40, 2 cc lidocaine injected easily. Completed without difficulty  Pain immediately resolved suggesting accurate placement of the medication.  Advised to call if fevers/chills, erythema, induration, drainage, or persistent bleeding.    Images permanently stored and available for review in the ultrasound unit.  Impression: Technically successful ultrasound guided injection.  Impression and Recommendations:

## 2013-12-05 LAB — CYCLIC CITRUL PEPTIDE ANTIBODY, IGG: Cyclic Citrullin Peptide Ab: <2 U/mL (ref 0.0–5.0)

## 2013-12-05 LAB — SEDIMENTATION RATE: Sed Rate: 1 mm/h (ref 0–22)

## 2013-12-13 ENCOUNTER — Ambulatory Visit: Payer: PRIVATE HEALTH INSURANCE | Admitting: Licensed Clinical Social Worker

## 2013-12-24 ENCOUNTER — Other Ambulatory Visit: Payer: Self-pay | Admitting: *Deleted

## 2013-12-24 MED ORDER — ZOLPIDEM TARTRATE 5 MG PO TABS: ORAL_TABLET | ORAL | Status: DC

## 2013-12-27 ENCOUNTER — Ambulatory Visit (INDEPENDENT_AMBULATORY_CARE_PROVIDER_SITE_OTHER): Payer: PRIVATE HEALTH INSURANCE | Admitting: Licensed Clinical Social Worker

## 2013-12-27 DIAGNOSIS — F419 Anxiety disorder, unspecified: Secondary | ICD-10-CM

## 2013-12-27 DIAGNOSIS — F3341 Major depressive disorder, recurrent, in partial remission: Secondary | ICD-10-CM

## 2014-01-01 ENCOUNTER — Ambulatory Visit (INDEPENDENT_AMBULATORY_CARE_PROVIDER_SITE_OTHER): Payer: PRIVATE HEALTH INSURANCE | Admitting: Sports Medicine

## 2014-01-01 ENCOUNTER — Encounter: Payer: Self-pay | Admitting: Sports Medicine

## 2014-01-01 VITALS — BP 122/71 | HR 90 | Ht 66.0 in | Wt 128.0 lb

## 2014-01-01 DIAGNOSIS — M19049 Primary osteoarthritis, unspecified hand: Secondary | ICD-10-CM

## 2014-01-01 DIAGNOSIS — M19032 Primary osteoarthritis, left wrist: Secondary | ICD-10-CM

## 2014-01-01 DIAGNOSIS — M79671 Pain in right foot: Secondary | ICD-10-CM | POA: Insufficient documentation

## 2014-01-01 DIAGNOSIS — M79672 Pain in left foot: Secondary | ICD-10-CM

## 2014-01-01 DIAGNOSIS — M797 Fibromyalgia: Secondary | ICD-10-CM

## 2014-01-01 MED ORDER — MELOXICAM 15 MG PO TABS: ORAL_TABLET | ORAL | Status: DC

## 2014-01-01 NOTE — Assessment & Plan Note (Signed)
Having some pain at the trapezial metacarpal joint. Injection into this joint today. Constipation with Voltaren, switching to Mobic.

## 2014-01-01 NOTE — Assessment & Plan Note (Signed)
Radiocarpal joint is pain-free after injection.

## 2014-01-01 NOTE — Assessment & Plan Note (Signed)
Once we have treated each of her joints individually we can work further on basal control of her fibromyalgia.

## 2014-01-01 NOTE — Assessment & Plan Note (Signed)
Return for custom orthotics likely with metatarsal pads for hammertoe.

## 2014-01-01 NOTE — Progress Notes (Signed)
  Subjective:    CC: Followup  HPI: Left wrist osteoarthritis: Pain-free after injection.  CMC osteoarthritis left: Pain is moderate, persistent, localized without radiation.  Fibromyalgia: Uncontrolled however agrees to let me treat each of her joints before we focus more on her fibromyalgia.  Past medical history, Surgical history, Family history not pertinant except as noted below, Social history, Allergies, and medications have been entered into the medical record, reviewed, and no changes needed.   Review of Systems: No fevers, chills, night sweats, weight loss, chest pain, or shortness of breath.   Objective:    General: Well Developed, well nourished, and in no acute distress.  Neuro: Alert and oriented x3, extra-ocular muscles intact, sensation grossly intact.  HEENT: Normocephalic, atraumatic, pupils equal round reactive to light, neck supple, no masses, no lymphadenopathy, thyroid nonpalpable.  Skin: Warm and dry, no rashes. Cardiac: Regular rate and rhythm, no murmurs rubs or gallops, no lower extremity edema.  Respiratory: Clear to auscultation bilaterally. Not using accessory muscles, speaking in full sentences.  Procedure: Real-time Ultrasound Guided Injection of left trapeziometacarpal joint Device: GE Logiq E  Verbal informed consent obtained.  Time-out conducted.  Noted no overlying erythema, induration, or other signs of local infection.  Skin prepped in a sterile fashion.  Local anesthesia: Topical Ethyl chloride.  With sterile technique and under real time ultrasound guidance 0.5 cc kenalog 40, 0.5 cc lidocaine injected easily.:   Completed without difficulty  Pain immediately resolved suggesting accurate placement of the medication.  Advised to call if fevers/chills, erythema, induration, drainage, or persistent bleeding.  Images permanently stored and available for review in the ultrasound unit.  Impression: Technically successful ultrasound guided  injection.  Impression and Recommendations:

## 2014-01-07 ENCOUNTER — Other Ambulatory Visit: Payer: Self-pay | Admitting: Family Medicine

## 2014-01-08 ENCOUNTER — Ambulatory Visit (INDEPENDENT_AMBULATORY_CARE_PROVIDER_SITE_OTHER): Payer: PRIVATE HEALTH INSURANCE | Admitting: Licensed Clinical Social Worker

## 2014-01-08 DIAGNOSIS — F332 Major depressive disorder, recurrent severe without psychotic features: Secondary | ICD-10-CM

## 2014-01-08 DIAGNOSIS — F419 Anxiety disorder, unspecified: Secondary | ICD-10-CM

## 2014-01-15 ENCOUNTER — Other Ambulatory Visit: Payer: Self-pay | Admitting: *Deleted

## 2014-01-15 MED ORDER — TRAMADOL HCL 50 MG PO TABS: ORAL_TABLET | ORAL | Status: DC

## 2014-01-21 ENCOUNTER — Encounter: Payer: Self-pay | Admitting: Family Medicine

## 2014-01-21 ENCOUNTER — Ambulatory Visit (INDEPENDENT_AMBULATORY_CARE_PROVIDER_SITE_OTHER): Payer: PRIVATE HEALTH INSURANCE | Admitting: Family Medicine

## 2014-01-21 VITALS — BP 123/66 | HR 97 | Wt 128.0 lb

## 2014-01-21 DIAGNOSIS — F430 Acute stress reaction: Secondary | ICD-10-CM

## 2014-01-21 DIAGNOSIS — F410 Panic disorder [episodic paroxysmal anxiety] without agoraphobia: Secondary | ICD-10-CM

## 2014-01-21 DIAGNOSIS — F411 Generalized anxiety disorder: Secondary | ICD-10-CM

## 2014-01-21 DIAGNOSIS — M797 Fibromyalgia: Secondary | ICD-10-CM

## 2014-01-21 NOTE — Progress Notes (Signed)
Subjective:    Patient ID: Robin Conrad, female    DOB: [birth year], 63 y.o.   MRN: [id number]  HPI F/U anxiety and panic attacks. Says had the worst panic attack of her life last Tuesday.  She got her times confused when picking up her grandchild from daycare because her husband forgot to move her watch back an hour with the time change. She got very upset and started crying uncontrollably.  She felt the was the works panic attack she has ever had in her life. She also reports at that morning she was so busy she exited forgot to take her citalopram and her Xanax that she typically takes in the morning. She had also not taking her Wellbutrin yet and this was around 1:00 in the afternoon. Her husband who is a Theme park manager at [employer] with the daycare is heard from some of the workers there and was very concerned about her. He has suggested possibly retiring early in the spring to be home to help take care of the grandchildren. One of her 2 grandchildren that she takes care of on a daily basis has special needs and has cerebral palsy. She has been trying to push her daughter to put the child was special needs into a half-day of daycare 2 days per week where she could receive her physical therapy and this would give Robin Conrad a break for 2 mornings out of the week.  She has to continue to lift Robin Conrad her special needs grandchild  F/U fibromyalgia - Uses her Lorrin Mais and tramadol at bedtime.  She had the pharmacist review her medications and they had suggested Cymbalta insteaed of citalopram for her depression/anxiety and her fibro.  Though she is happy with her regimen.   Review of Systems     Objective:   Physical Exam  Constitutional: She is oriented to person, place, and time. She appears well-developed and well-nourished.  HENT:  Head: Normocephalic and atraumatic.  Neurological: She is alert and oriented to person, place, and time.  Skin: Skin is warm and dry.  Psychiatric: She has a normal mood and affect.  Her behavior is normal.          Assessment & Plan:  Panic attack - we had a long conversation about having a serious conversation with her daughter. At some point they really need to discuss giving her a break and working on reducing her hours that she takes care of her grandchildren. I think it's getting to a point where it's almost overwhelming some days. Physically and emotionally. She has suggested putting the grandchild with special needs into daycare half-day 2 days per week and I think that this would be fantastic to give her a little bit more personal time and give her more break with lifting etc. Of the younger grandchild. But really she needs to discuss this with her granddaughter. She is also seeing her therapist tomorrow and encouraged her to talk with her about this as well. If she doesn't make some changes to reduce her stress then it won't get better and will not be able to get her panic attacks under control. She feels her husband is not really understanding or supportive.   Fibromyalgia-for now we'll continue with citalopram. We had a discussion about the fact that SSRIs can be used effectively to treat fibromyalgia and does not necessarily have to be the Cymbalta product which is FDA approved. But certainly if she is unhappy with her regimen we can consider a change. At  this time she would like to continue with her current regimen of sent citalopram.  Time spent 30 min, > 50% spent counseling about panic attacks and fibromyalgia.

## 2014-01-22 ENCOUNTER — Ambulatory Visit (INDEPENDENT_AMBULATORY_CARE_PROVIDER_SITE_OTHER): Payer: PRIVATE HEALTH INSURANCE | Admitting: Licensed Clinical Social Worker

## 2014-01-22 DIAGNOSIS — F3341 Major depressive disorder, recurrent, in partial remission: Secondary | ICD-10-CM

## 2014-01-22 DIAGNOSIS — F419 Anxiety disorder, unspecified: Secondary | ICD-10-CM

## 2014-01-24 ENCOUNTER — Encounter: Payer: Self-pay | Admitting: Sports Medicine

## 2014-01-24 ENCOUNTER — Ambulatory Visit (INDEPENDENT_AMBULATORY_CARE_PROVIDER_SITE_OTHER): Payer: PRIVATE HEALTH INSURANCE | Admitting: Sports Medicine

## 2014-01-24 VITALS — BP 115/63 | HR 102 | Wt 127.0 lb

## 2014-01-24 DIAGNOSIS — M1990 Unspecified osteoarthritis, unspecified site: Secondary | ICD-10-CM

## 2014-01-24 DIAGNOSIS — M79672 Pain in left foot: Secondary | ICD-10-CM

## 2014-01-24 DIAGNOSIS — M19049 Primary osteoarthritis, unspecified hand: Secondary | ICD-10-CM

## 2014-01-24 DIAGNOSIS — M79671 Pain in right foot: Secondary | ICD-10-CM

## 2014-01-24 NOTE — Progress Notes (Addendum)
    Patient was fitted for a : standard, cushioned, semi-rigid orthotic. The orthotic was heated and afterward the patient stood on the orthotic blank positioned on the orthotic stand. The patient was positioned in subtalar neutral position and 10 degrees of ankle dorsiflexion in a weight bearing stance. After completion of molding, a stable base was applied to the orthotic blank. The blank was ground to a stable position for weight bearing. Size: 8 Base: [employer] and safety inspector and Padding: None The patient ambulated these, they were comfortable however she continued to have pain at the left first metatarsophalangeal joint.  Procedure: Real-time Ultrasound Guided Injection of left first metatarsophalangeal joint Device: GE Logiq E  Verbal informed consent obtained.  Time-out conducted.  Noted no overlying erythema, induration, or other signs of local infection.  Skin prepped in a sterile fashion.  Local anesthesia: Topical Ethyl chloride.  With sterile technique and under real time ultrasound guidance:  0.5 mL kenalog 40, 0.5 mL lidocaine injected easily.  Noted significant synovitis. Completed without difficulty  Pain immediately resolved suggesting accurate placement of the medication.  Advised to call if fevers/chills, erythema, induration, drainage, or persistent bleeding.  Images permanently stored and available for review in the ultrasound unit.  Impression: Technically successful ultrasound guided injection.  I spent 40 minutes with this patient, greater than 50% was face-to-face time counseling regarding foot pain and orthotics.  Time spent for injection is not included in the E&M code.

## 2014-01-24 NOTE — Assessment & Plan Note (Signed)
Custom orthotics as above. She does have pes cavus. Left first metatarsophalangeal joint injection as above, there was significant osteoarthritis here. Return in one month.

## 2014-01-24 NOTE — Assessment & Plan Note (Signed)
Resolved after injection 

## 2014-01-25 ENCOUNTER — Other Ambulatory Visit: Payer: Self-pay

## 2014-01-25 MED ORDER — ZOLPIDEM TARTRATE 5 MG PO TABS: ORAL_TABLET | ORAL | Status: DC

## 2014-02-01 ENCOUNTER — Other Ambulatory Visit: Payer: Self-pay | Admitting: *Deleted

## 2014-02-01 MED ORDER — ALPRAZOLAM 0.25 MG PO TABS: ORAL_TABLET | ORAL | Status: DC

## 2014-02-05 ENCOUNTER — Other Ambulatory Visit: Payer: Self-pay | Admitting: Family Medicine

## 2014-02-05 ENCOUNTER — Ambulatory Visit (INDEPENDENT_AMBULATORY_CARE_PROVIDER_SITE_OTHER): Payer: PRIVATE HEALTH INSURANCE | Admitting: Licensed Clinical Social Worker

## 2014-02-05 DIAGNOSIS — F3341 Major depressive disorder, recurrent, in partial remission: Secondary | ICD-10-CM

## 2014-02-05 DIAGNOSIS — F419 Anxiety disorder, unspecified: Secondary | ICD-10-CM

## 2014-02-12 ENCOUNTER — Other Ambulatory Visit: Payer: Self-pay | Admitting: Family Medicine

## 2014-02-13 ENCOUNTER — Other Ambulatory Visit: Payer: Self-pay | Admitting: Physician Assistant

## 2014-02-14 ENCOUNTER — Ambulatory Visit: Payer: Self-pay | Admitting: Family Medicine

## 2014-02-18 ENCOUNTER — Other Ambulatory Visit: Payer: Self-pay

## 2014-02-18 MED ORDER — BUPROPION HCL ER (XL) 150 MG PO TB24: 450.0000 mg | ORAL_TABLET | ORAL | Status: DC

## 2014-02-20 ENCOUNTER — Encounter: Payer: Self-pay | Admitting: Family Medicine

## 2014-02-20 ENCOUNTER — Ambulatory Visit (INDEPENDENT_AMBULATORY_CARE_PROVIDER_SITE_OTHER): Payer: PRIVATE HEALTH INSURANCE | Admitting: Family Medicine

## 2014-02-20 ENCOUNTER — Encounter: Payer: Self-pay | Admitting: Sports Medicine

## 2014-02-20 ENCOUNTER — Ambulatory Visit (INDEPENDENT_AMBULATORY_CARE_PROVIDER_SITE_OTHER): Payer: PRIVATE HEALTH INSURANCE | Admitting: Sports Medicine

## 2014-02-20 ENCOUNTER — Ambulatory Visit: Payer: Self-pay | Admitting: Family Medicine

## 2014-02-20 VITALS — BP 125/69 | HR 100 | Ht 66.0 in | Wt 128.0 lb

## 2014-02-20 VITALS — BP 125/69 | HR 100 | Temp 98.2°F | Ht 66.0 in | Wt 128.0 lb

## 2014-02-20 DIAGNOSIS — F411 Generalized anxiety disorder: Secondary | ICD-10-CM

## 2014-02-20 DIAGNOSIS — M159 Polyosteoarthritis, unspecified: Secondary | ICD-10-CM

## 2014-02-20 DIAGNOSIS — J4540 Moderate persistent asthma, uncomplicated: Secondary | ICD-10-CM

## 2014-02-20 DIAGNOSIS — M19032 Primary osteoarthritis, left wrist: Secondary | ICD-10-CM

## 2014-02-20 DIAGNOSIS — H9201 Otalgia, right ear: Secondary | ICD-10-CM

## 2014-02-20 DIAGNOSIS — M797 Fibromyalgia: Secondary | ICD-10-CM

## 2014-02-20 DIAGNOSIS — Z0000 Encounter for general adult medical examination without abnormal findings: Secondary | ICD-10-CM

## 2014-02-20 MED ORDER — IBUPROFEN 800 MG PO TABS: 800.0000 mg | ORAL_TABLET | Freq: Three times a day (TID) | ORAL | Status: DC | PRN

## 2014-02-20 MED ORDER — ZOLPIDEM TARTRATE 5 MG PO TABS: ORAL_TABLET | ORAL | Status: DC

## 2014-02-20 NOTE — Progress Notes (Signed)
   Subjective:    Patient ID: Robin Conrad, female    DOB: 05-29-1950, 63 y.o.   MRN: [id number]  HPI Right ear pain and pressure on and off for 1 year.  She saw ENT and they didn't see anything.  Has noticed doesn't hear as well out of the ear.    Stress/GAD - she now has a babysitter to help her take care of her granddaughter who has special needs.  She is not very mobile.  No more panic attacks since then.   Asthma - she has been coughing a little more . Voice is getting more haorse. Used her albuterol every day in the last week.  She is using her symbicort daily. She is on macrobid for another UTI.    Having frequent UTI - taking cranberry extract. She now just takes antibiotics as needed for urinary symptoms. Unfortunate she had multiple in the last year. She's also now using an estrogen cream so hopefully this will reduce the frequency of her UTIs as well. She's being followed by urology for this.  Review of Systems     Objective:   Physical Exam  Constitutional: She is oriented to person, place, and time. She appears well-developed and well-nourished.  HENT:  Head: Normocephalic and atraumatic.  Cardiovascular: Normal rate, regular rhythm and normal heart sounds.   Pulmonary/Chest: Effort normal and breath sounds normal.  Neurological: She is alert and oriented to person, place, and time.  Skin: Skin is warm and dry.  Psychiatric: She has a normal mood and affect. Her behavior is normal.          Assessment & Plan:  Right ear pain and pressure - Will refer for second opinion. Will refer to Dr. Wilburn Cornelia.    Fibromyalgia.  Stable. No recent flares. She's been getting more help with her granddaughter which is fantastic.  Generalized anxiety-no more panic attack since I last saw her a month ago. She now getting a little bit more help and is not feeling as overwhelmed and rash. 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 He says  it's been a very stressful year for both of them so he may try to retire early.  Asthma-moderate persistent-she is Re: On an inhaled corticosteroid and long-acting albuterol. We could consider a short course of prednisone. She wants to wait and see how she does towards the weekend. If still needing her OB roll daily for next week I encouraged her to call the office.

## 2014-02-20 NOTE — Assessment & Plan Note (Addendum)
Doing well after left-sided radiocarpal and trapeziometacarpal joint injections. Also doing well after a left first tarsometatarsal injection. Today we performed a right third metacarpal phalangeal joint injection, right fourth and fifth proximal interphalangeal joint injections and a right tarsometatarsal joint injection. Continue topical diclofenac. Switching to oral ibuprofen.

## 2014-02-20 NOTE — Progress Notes (Signed)
Subjective:    CC: follow-up  HPI: Generalized osteoarthritis: this pleasant 63 year old female returns, we injected her left wrist joint as well as her left trapeziometacarpal joint and left first metatarsophalangeal joint. All of the joints are doing very well.she continues to have some pain on the right side in her metacarpophalangeal and proximal interphalangeal joints as well as her right first metatarsophalangeal joint. Pain is moderate, persistent, she feels as though ibuprofen is more helpful than meloxicam. She continues to use Voltaren as well, with minimal response.  Past medical history, Surgical history, Family history not pertinant except as noted below, Social history, Allergies, and medications have been entered into the medical record, reviewed, and no changes needed.   Review of Systems: No fevers, chills, night sweats, weight loss, chest pain, or shortness of breath.   Objective:    General: Well Developed, well nourished, and in no acute distress.  Neuro: Alert and oriented x3, extra-ocular muscles intact, sensation grossly intact.  HEENT: Normocephalic, atraumatic, pupils equal round reactive to light, neck supple, no masses, no lymphadenopathy, thyroid nonpalpable.  Skin: Warm and dry, no rashes. Cardiac: Regular rate and rhythm, no murmurs rubs or gallops, no lower extremity edema.  Respiratory: Clear to auscultation bilaterally. Not using accessory muscles, speaking in full sentences.  Procedure: Real-time Ultrasound Guided Injection of right first metatarsal-phalangeal joint Device: GE Logiq E  Verbal informed consent obtained.  Time-out conducted.  Noted no overlying erythema, induration, or other signs of local infection.  Skin prepped in a sterile fashion.  Local anesthesia: Topical Ethyl chloride.  With sterile technique and under real time ultrasound guidance:  0.5 mL kenalog 40, 0.5 mL lidocaine injected easily. Completed without difficulty  Pain  immediately resolved suggesting accurate placement of the medication.  Advised to call if fevers/chills, erythema, induration, drainage, or persistent bleeding.  Images permanently stored and available for review in the ultrasound unit.  Impression: Technically successful ultrasound guided injection.  Procedure: Real-time Ultrasound Guided Injection of right third metacarpal phalangeal joint Device: GE Logiq E  Verbal informed consent obtained.  Time-out conducted.  Noted no overlying erythema, induration, or other signs of local infection.  Skin prepped in a sterile fashion.  Local anesthesia: Topical Ethyl chloride.  With sterile technique and under real time ultrasound guidance:  0.5 mL kenalog 40, 0.5 mL lidocaine injected easily. Completed without difficulty  Pain immediately resolved suggesting accurate placement of the medication.  Advised to call if fevers/chills, erythema, induration, drainage, or persistent bleeding.  Images permanently stored and available for review in the ultrasound unit.  Impression: Technically successful ultrasound guided injection.  Procedure: Real-time Ultrasound Guided Injection of right fourth proximal interphalangeal joint Device: GE Logiq E  Verbal informed consent obtained.  Time-out conducted.  Noted no overlying erythema, induration, or other signs of local infection.  Skin prepped in a sterile fashion.  Local anesthesia: Topical Ethyl chloride.  With sterile technique and under real time ultrasound guidance:  0.5 mL kenalog 40, 0.5 mL lidocaine injected easily. Completed without difficulty  Pain immediately resolved suggesting accurate placement of the medication.  Advised to call if fevers/chills, erythema, induration, drainage, or persistent bleeding.  Images permanently stored and available for review in the ultrasound unit.  Impression: Technically successful ultrasound guided injection.  Procedure: Real-time Ultrasound Guided Injection  of right fifth proximal interphalangeal joint Device: GE Logiq E  Verbal informed consent obtained.  Time-out conducted.  Noted no overlying erythema, induration, or other signs of local infection.  Skin prepped in a  sterile fashion.  Local anesthesia: Topical Ethyl chloride.  With sterile technique and under real time ultrasound guidance:  0.5 mL kenalog 40, 0.5 mL lidocaine injected easily. Completed without difficulty  Pain immediately resolved suggesting accurate placement of the medication.  Advised to call if fevers/chills, erythema, induration, drainage, or persistent bleeding.  Images permanently stored and available for review in the ultrasound unit.  Impression: Technically successful ultrasound guided injection.  Impression and Recommendations:

## 2014-02-20 NOTE — Assessment & Plan Note (Signed)
Continues to do well after injection.

## 2014-02-21 ENCOUNTER — Ambulatory Visit: Payer: PRIVATE HEALTH INSURANCE | Admitting: Licensed Clinical Social Worker

## 2014-02-21 LAB — COMPLETE METABOLIC PANEL WITH GFR
ALK PHOS: 132 U/L — AB (ref 39–117)
ALT: 34 U/L (ref 0–35)
AST: 28 U/L (ref 0–37)
Albumin: 4.1 g/dL (ref 3.5–5.2)
BUN: 12 mg/dL (ref 6–23)
CHLORIDE: 102 meq/L (ref 96–112)
CO2: 28 mEq/L (ref 19–32)
Calcium: 9.4 mg/dL (ref 8.4–10.5)
Creat: 0.78 mg/dL (ref 0.50–1.10)
GFR, Est African American: >89 mL/min
GFR, Est Non African American: 81 mL/min
Glucose, Bld: 84 mg/dL (ref 70–99)
POTASSIUM: 4.3 meq/L (ref 3.5–5.3)
SODIUM: 139 meq/L (ref 135–145)
TOTAL PROTEIN: 6.4 g/dL (ref 6.0–8.3)
Total Bilirubin: 0.5 mg/dL (ref 0.2–1.2)

## 2014-02-21 LAB — LIPID PANEL
CHOL/HDL RATIO: 1.9 ratio
CHOLESTEROL: 159 mg/dL (ref 0–200)
HDL: 84 mg/dL (ref >39)
LDL CALC: 62 mg/dL (ref 0–99)
Triglycerides: 66 mg/dL (ref <150)
VLDL: 13 mg/dL (ref 0–40)

## 2014-02-21 LAB — HEMOGLOBIN A1C
Hgb A1c MFr Bld: 5.6 % (ref <5.7)
MEAN PLASMA GLUCOSE: 114 mg/dL (ref <117)

## 2014-02-22 ENCOUNTER — Telehealth: Payer: Self-pay

## 2014-02-22 NOTE — Telephone Encounter (Signed)
Robin Conrad called and left a message stating the ENT referral was not done. I left her a message stating: [facility]- appt time on 03-19-14 at 10AM with Dr.Bates and their office number is [phone] and their fax is [phone]

## 2014-02-26 ENCOUNTER — Ambulatory Visit (INDEPENDENT_AMBULATORY_CARE_PROVIDER_SITE_OTHER): Payer: PRIVATE HEALTH INSURANCE | Admitting: Licensed Clinical Social Worker

## 2014-02-26 DIAGNOSIS — F419 Anxiety disorder, unspecified: Secondary | ICD-10-CM

## 2014-02-28 ENCOUNTER — Other Ambulatory Visit: Payer: Self-pay | Admitting: Family Medicine

## 2014-03-04 ENCOUNTER — Telehealth: Payer: Self-pay

## 2014-03-04 MED ORDER — PREDNISONE 20 MG PO TABS: 40.0000 mg | ORAL_TABLET | Freq: Every day | ORAL | Status: DC

## 2014-03-04 MED ORDER — BENZONATATE 200 MG PO CAPS: 200.0000 mg | ORAL_CAPSULE | Freq: Two times a day (BID) | ORAL | Status: DC | PRN

## 2014-03-04 NOTE — Telephone Encounter (Signed)
Tessalon sent

## 2014-03-04 NOTE — Telephone Encounter (Signed)
Robin Conrad reports she has had a cough for a week. She states this is caused by her Asthma. Denies congestion, fever, chills or sweats. She states she takes Tessalon as needed for cough. This is not on her current medication list. Please advise.

## 2014-03-04 NOTE — Telephone Encounter (Signed)
i will send over 5 day course of prednisone.  Come in if not better next week.

## 2014-03-04 NOTE — Telephone Encounter (Signed)
She still wants a refill on Tessalon. Patient advised of prednisone prescription.

## 2014-03-06 ENCOUNTER — Other Ambulatory Visit: Payer: Self-pay | Admitting: Family Medicine

## 2014-03-13 ENCOUNTER — Telehealth: Payer: Self-pay | Admitting: *Deleted

## 2014-03-13 ENCOUNTER — Other Ambulatory Visit: Payer: Self-pay

## 2014-03-13 MED ORDER — TRAMADOL HCL 50 MG PO TABS: ORAL_TABLET | ORAL | Status: DC

## 2014-03-13 NOTE — Telephone Encounter (Signed)
Form mailed to :  eligibility for [place]. [address] [place] [address]  Called and informed pt of this being done.Robin Conrad

## 2014-03-13 NOTE — Telephone Encounter (Signed)
Patient called for a refill request of tramadol. She is not due until January 3 rd.

## 2014-03-13 NOTE — Telephone Encounter (Signed)
Spoke w/pt's husband about her health screening form. He asked if this can be emailed to him @ [email or web address]. Will give to T.Pyrtle to email.Audelia Hives [place]

## 2014-03-14 ENCOUNTER — Other Ambulatory Visit: Payer: Self-pay | Admitting: *Deleted

## 2014-03-14 ENCOUNTER — Telehealth: Payer: Self-pay | Admitting: *Deleted

## 2014-03-14 NOTE — Telephone Encounter (Signed)
Pt called and lvm asking for refill of tramadol. This was sent on 03/13/14. Called her and lvm informing her that in the pharmacy note she can get this filled on 03/16/14.Maryruth Eve, Lahoma Crocker

## 2014-03-21 ENCOUNTER — Ambulatory Visit (INDEPENDENT_AMBULATORY_CARE_PROVIDER_SITE_OTHER): Payer: PRIVATE HEALTH INSURANCE | Admitting: Licensed Clinical Social Worker

## 2014-03-21 ENCOUNTER — Ambulatory Visit: Payer: Self-pay | Admitting: Sports Medicine

## 2014-03-21 DIAGNOSIS — F3341 Major depressive disorder, recurrent, in partial remission: Secondary | ICD-10-CM

## 2014-03-21 DIAGNOSIS — F419 Anxiety disorder, unspecified: Secondary | ICD-10-CM

## 2014-03-25 ENCOUNTER — Other Ambulatory Visit (HOSPITAL_COMMUNITY): Payer: Self-pay | Admitting: Otolaryngology

## 2014-03-25 DIAGNOSIS — H833X1 Noise effects on right inner ear: Secondary | ICD-10-CM

## 2014-03-26 ENCOUNTER — Ambulatory Visit (INDEPENDENT_AMBULATORY_CARE_PROVIDER_SITE_OTHER): Payer: PRIVATE HEALTH INSURANCE | Admitting: Sports Medicine

## 2014-03-26 ENCOUNTER — Encounter: Payer: Self-pay | Admitting: Sports Medicine

## 2014-03-26 VITALS — BP 136/77 | HR 99 | Ht 66.0 in | Wt 131.0 lb

## 2014-03-26 DIAGNOSIS — M222X1 Patellofemoral disorders, right knee: Secondary | ICD-10-CM | POA: Insufficient documentation

## 2014-03-26 DIAGNOSIS — K5900 Constipation, unspecified: Secondary | ICD-10-CM

## 2014-03-26 DIAGNOSIS — M159 Polyosteoarthritis, unspecified: Secondary | ICD-10-CM

## 2014-03-26 DIAGNOSIS — M222X2 Patellofemoral disorders, left knee: Secondary | ICD-10-CM

## 2014-03-26 DIAGNOSIS — K5909 Other constipation: Secondary | ICD-10-CM

## 2014-03-26 NOTE — Progress Notes (Signed)
  Subjective:    CC: Follow up  HPI: Generalized osteoarthritis: Is post several interphalangeal joint injections as well as intertarsal injections, overall doing well. She does do well with glucosamine/chondroitin. Symptoms are mild, improving.  Metatarsalgia: Improved significantly with custom orthotics.  Bilateral knee and thigh pain: History of fibromyalgia, she does have significant pain in the anterior knees and thighs with going up and down stairs, amenable to try formal physical therapy.  Constipation: Unfortunately has not responded to Linzess, Amitiza, MiraLAX.  Past medical history, Surgical history, Family history not pertinant except as noted below, Social history, Allergies, and medications have been entered into the medical record, reviewed, and no changes needed.   Review of Systems: No fevers, chills, night sweats, weight loss, chest pain, or shortness of breath.   Objective:    General: Well Developed, well nourished, and in no acute distress.  Neuro: Alert and oriented x3, extra-ocular muscles intact, sensation grossly intact.  HEENT: Normocephalic, atraumatic, pupils equal round reactive to light, neck supple, no masses, no lymphadenopathy, thyroid nonpalpable.  Skin: Warm and dry, no rashes. Cardiac: Regular rate and rhythm, no murmurs rubs or gallops, no lower extremity edema.  Respiratory: Clear to auscultation bilaterally. Not using accessory muscles, speaking in full sentences. Bilateral Knee: Normal to inspection with no erythema or effusion or obvious bony abnormalities. Tender to palpation under the medial and lateral patellar facets ROM normal in flexion and extension and lower leg rotation. Ligaments with solid consistent endpoints including ACL, PCL, LCL, MCL. Negative Mcmurray's and provocative meniscal tests. Non painful patellar compression. Patellar and quadriceps tendons unremarkable. Hamstring and quadriceps strength is normal.  Impression and  Recommendations:

## 2014-03-26 NOTE — Assessment & Plan Note (Signed)
Overall doing significantly better after multiple interphalangeal as well as intertarsal joint injections at previous visits. She is using glucosamine and chondroitin which seems to be helping.

## 2014-03-26 NOTE — Assessment & Plan Note (Signed)
Has not responded to Amitiza or Linzess. Continues to be constipated on MiraLAX. I'm going to give her some samples of Movantik. Further workup through primary

## 2014-03-26 NOTE — Assessment & Plan Note (Signed)
Formal physical therapy.

## 2014-04-02 ENCOUNTER — Ambulatory Visit: Payer: PRIVATE HEALTH INSURANCE | Attending: Sports Medicine | Admitting: Rehabilitation

## 2014-04-02 DIAGNOSIS — M25562 Pain in left knee: Secondary | ICD-10-CM | POA: Insufficient documentation

## 2014-04-02 DIAGNOSIS — M25561 Pain in right knee: Secondary | ICD-10-CM | POA: Insufficient documentation

## 2014-04-03 ENCOUNTER — Ambulatory Visit: Payer: PRIVATE HEALTH INSURANCE | Admitting: Rehabilitation

## 2014-04-04 ENCOUNTER — Ambulatory Visit: Payer: PRIVATE HEALTH INSURANCE | Admitting: Licensed Clinical Social Worker

## 2014-04-08 ENCOUNTER — Ambulatory Visit (HOSPITAL_COMMUNITY): Admission: RE | Admit: 2014-04-08 | Payer: PRIVATE HEALTH INSURANCE | Source: Ambulatory Visit

## 2014-04-09 ENCOUNTER — Ambulatory Visit: Payer: PRIVATE HEALTH INSURANCE | Admitting: Rehabilitation

## 2014-04-09 ENCOUNTER — Ambulatory Visit (INDEPENDENT_AMBULATORY_CARE_PROVIDER_SITE_OTHER): Payer: PRIVATE HEALTH INSURANCE | Admitting: Licensed Clinical Social Worker

## 2014-04-09 DIAGNOSIS — F419 Anxiety disorder, unspecified: Secondary | ICD-10-CM

## 2014-04-09 DIAGNOSIS — M25561 Pain in right knee: Secondary | ICD-10-CM | POA: Diagnosis not present

## 2014-04-09 DIAGNOSIS — F3341 Major depressive disorder, recurrent, in partial remission: Secondary | ICD-10-CM

## 2014-04-11 ENCOUNTER — Other Ambulatory Visit: Payer: Self-pay | Admitting: Critical Care Medicine

## 2014-04-11 ENCOUNTER — Ambulatory Visit: Payer: PRIVATE HEALTH INSURANCE | Admitting: Rehabilitation

## 2014-04-11 DIAGNOSIS — M25561 Pain in right knee: Secondary | ICD-10-CM | POA: Diagnosis not present

## 2014-04-17 ENCOUNTER — Ambulatory Visit: Payer: PRIVATE HEALTH INSURANCE | Admitting: Rehabilitation

## 2014-04-18 ENCOUNTER — Ambulatory Visit: Payer: PRIVATE HEALTH INSURANCE | Attending: Sports Medicine

## 2014-04-18 DIAGNOSIS — M25561 Pain in right knee: Secondary | ICD-10-CM | POA: Diagnosis not present

## 2014-04-18 DIAGNOSIS — M25562 Pain in left knee: Secondary | ICD-10-CM | POA: Insufficient documentation

## 2014-04-19 ENCOUNTER — Ambulatory Visit: Payer: PRIVATE HEALTH INSURANCE | Admitting: Rehabilitation

## 2014-04-19 DIAGNOSIS — M25561 Pain in right knee: Secondary | ICD-10-CM | POA: Diagnosis not present

## 2014-04-23 ENCOUNTER — Ambulatory Visit: Payer: Self-pay | Admitting: Family Medicine

## 2014-04-23 ENCOUNTER — Ambulatory Visit (INDEPENDENT_AMBULATORY_CARE_PROVIDER_SITE_OTHER): Payer: PRIVATE HEALTH INSURANCE | Admitting: Licensed Clinical Social Worker

## 2014-04-23 ENCOUNTER — Ambulatory Visit (INDEPENDENT_AMBULATORY_CARE_PROVIDER_SITE_OTHER): Payer: PRIVATE HEALTH INSURANCE | Admitting: Family Medicine

## 2014-04-23 ENCOUNTER — Encounter: Payer: Self-pay | Admitting: Family Medicine

## 2014-04-23 VITALS — BP 108/56 | HR 99 | Ht 66.0 in | Wt 129.0 lb

## 2014-04-23 DIAGNOSIS — R05 Cough: Secondary | ICD-10-CM

## 2014-04-23 DIAGNOSIS — F3341 Major depressive disorder, recurrent, in partial remission: Secondary | ICD-10-CM

## 2014-04-23 DIAGNOSIS — F411 Generalized anxiety disorder: Secondary | ICD-10-CM

## 2014-04-23 DIAGNOSIS — J4540 Moderate persistent asthma, uncomplicated: Secondary | ICD-10-CM

## 2014-04-23 DIAGNOSIS — F419 Anxiety disorder, unspecified: Secondary | ICD-10-CM

## 2014-04-23 DIAGNOSIS — R059 Cough, unspecified: Secondary | ICD-10-CM

## 2014-04-23 DIAGNOSIS — G4700 Insomnia, unspecified: Secondary | ICD-10-CM

## 2014-04-23 DIAGNOSIS — M797 Fibromyalgia: Secondary | ICD-10-CM

## 2014-04-23 MED ORDER — BENZONATATE 200 MG PO CAPS: 200.0000 mg | ORAL_CAPSULE | Freq: Two times a day (BID) | ORAL | Status: DC | PRN

## 2014-04-23 MED ORDER — CITALOPRAM HYDROBROMIDE 20 MG PO TABS: ORAL_TABLET | ORAL | Status: DC

## 2014-04-23 MED ORDER — TRAMADOL HCL 50 MG PO TABS: ORAL_TABLET | ORAL | Status: DC

## 2014-04-23 NOTE — Progress Notes (Signed)
   Subjective:    Patient ID: Robin Conrad, female    DOB: [birth year], 64 y.o.   MRN: [id number]  HPI GAD - she is doing well overall but still having episdoes where she feels very anxious and overwhlemed. Esp with the grandkidsn. Even small things like running a little bit late gets her anxiety levels extremely high. She starts to get chest tightness in physical symptoms   Insomnia - hasn't been taking her Lorrin Mais much bc she was afraid to take with her xanax.   Had a hard time  Getting some of her meds at tht pharmacy.    Does need a refill on her cough pill. Has been using once a day recently . No fever or chills. Thinks it is her ashtma.  See note below.   Fibromyalgia - she is doing PT with her knees and hip.  She is very inflexible.  Does need a refill on her tramadol.  Using her flexeril occ.    Asthma - doing well on the symbicort but says she feels like she is running out early. Says when gets to the bottom of her inhaler she really notices a difference.  Feels like it is not as effective.    Review of Systems     Objective:   Physical Exam  Constitutional: She is oriented to person, place, and time. She appears well-developed and well-nourished.  HENT:  Head: Normocephalic and atraumatic.  Cardiovascular: Normal rate, regular rhythm and normal heart sounds.   Pulmonary/Chest: Effort normal and breath sounds normal.  Neurological: She is alert and oriented to person, place, and time.  Skin: Skin is warm and dry.  Psychiatric: She has a normal mood and affect. Her behavior is normal.          Assessment & Plan:  GAD- On wellbutrin on celexa and doing fair. Will inc the celexa to 20mg . it sounds like she is really struggling with coping with her anxiety. Discussed with her that she's been in the habit for years of dealing with stress in a certain way and now it's time to really work on retraining her brain on how to react to stressful situations such as running late..  Discussed possible biofeedback. Will call her therapist [name] and talk with her about options.   Insomnia - hold her xanax when takes the Ambien.  Avoid taking 2 depressants.    Cough - refill tessalon pearles  Asthma - will given sample of Symbicort.  Not sure why she is running out early.  It sounds like she's taking it appropriately.  Fibromyalgia - Doing well wiht PT.   Will refill trmadol. Says she doesn't need the flexeril right now.

## 2014-04-24 ENCOUNTER — Ambulatory Visit: Payer: PRIVATE HEALTH INSURANCE | Admitting: Rehabilitation

## 2014-04-24 DIAGNOSIS — M25561 Pain in right knee: Secondary | ICD-10-CM | POA: Diagnosis not present

## 2014-04-26 ENCOUNTER — Ambulatory Visit: Payer: PRIVATE HEALTH INSURANCE | Admitting: Rehabilitation

## 2014-05-01 ENCOUNTER — Ambulatory Visit: Payer: PRIVATE HEALTH INSURANCE | Admitting: Rehabilitation

## 2014-05-03 ENCOUNTER — Encounter: Payer: Self-pay | Admitting: Rehabilitation

## 2014-05-07 ENCOUNTER — Ambulatory Visit: Payer: PRIVATE HEALTH INSURANCE | Admitting: Licensed Clinical Social Worker

## 2014-05-07 ENCOUNTER — Ambulatory Visit: Payer: PRIVATE HEALTH INSURANCE | Admitting: Rehabilitation

## 2014-05-08 ENCOUNTER — Other Ambulatory Visit: Payer: Self-pay | Admitting: Family Medicine

## 2014-05-10 ENCOUNTER — Ambulatory Visit: Payer: PRIVATE HEALTH INSURANCE | Admitting: Rehabilitation

## 2014-05-13 ENCOUNTER — Encounter: Payer: Self-pay | Admitting: Rehabilitation

## 2014-05-13 ENCOUNTER — Ambulatory Visit: Payer: PRIVATE HEALTH INSURANCE | Admitting: Rehabilitation

## 2014-05-13 DIAGNOSIS — M25562 Pain in left knee: Principal | ICD-10-CM

## 2014-05-13 DIAGNOSIS — M25561 Pain in right knee: Secondary | ICD-10-CM

## 2014-05-13 NOTE — Patient Instructions (Signed)
Will continue to perform HEP: Standing 3way hip yellow band 1-3sets of 10 Hamstring stretch 3x20", calf stretch 3x20" SLR x10, bridging x 10, sit to stand without hands, stairs, walking outside
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
## 2014-05-15 ENCOUNTER — Other Ambulatory Visit: Payer: Self-pay | Admitting: Critical Care Medicine

## 2014-05-16 ENCOUNTER — Ambulatory Visit: Payer: PRIVATE HEALTH INSURANCE | Admitting: Rehabilitation

## 2014-05-30 ENCOUNTER — Ambulatory Visit (INDEPENDENT_AMBULATORY_CARE_PROVIDER_SITE_OTHER): Payer: PRIVATE HEALTH INSURANCE | Admitting: Licensed Clinical Social Worker

## 2014-05-30 DIAGNOSIS — F419 Anxiety disorder, unspecified: Secondary | ICD-10-CM | POA: Diagnosis not present

## 2014-05-30 DIAGNOSIS — F3341 Major depressive disorder, recurrent, in partial remission: Secondary | ICD-10-CM | POA: Diagnosis not present

## 2014-06-13 ENCOUNTER — Other Ambulatory Visit: Payer: Self-pay | Admitting: Family Medicine

## 2014-06-17 ENCOUNTER — Other Ambulatory Visit: Payer: Self-pay | Admitting: Critical Care Medicine

## 2014-06-20 ENCOUNTER — Ambulatory Visit (INDEPENDENT_AMBULATORY_CARE_PROVIDER_SITE_OTHER): Payer: PRIVATE HEALTH INSURANCE | Admitting: Licensed Clinical Social Worker

## 2014-06-20 DIAGNOSIS — F3341 Major depressive disorder, recurrent, in partial remission: Secondary | ICD-10-CM | POA: Diagnosis not present

## 2014-06-20 DIAGNOSIS — F419 Anxiety disorder, unspecified: Secondary | ICD-10-CM

## 2014-06-25 ENCOUNTER — Ambulatory Visit (INDEPENDENT_AMBULATORY_CARE_PROVIDER_SITE_OTHER): Payer: PRIVATE HEALTH INSURANCE | Admitting: Sports Medicine

## 2014-06-25 ENCOUNTER — Encounter: Payer: Self-pay | Admitting: Sports Medicine

## 2014-06-25 VITALS — BP 114/61 | HR 104 | Ht 66.0 in | Wt 126.0 lb

## 2014-06-25 DIAGNOSIS — M159 Polyosteoarthritis, unspecified: Secondary | ICD-10-CM | POA: Diagnosis not present

## 2014-06-25 NOTE — Assessment & Plan Note (Signed)
Recurrence of swelling with synovitis at the left first metatarsophalangeal joint, previous injection was 6 months ago.  Repeat injection today, first metatarsal ray post placed in her orthotic. Return in one month if no better.

## 2014-06-25 NOTE — Progress Notes (Signed)
  Subjective:    CC: Left foot pain  HPI: This is a very pleasant 64 year old female who comes in with generalized osteoarthritis, she has had pain and swelling at the left first metatarsal phalangeal joint on and off. We did an injection 6 months ago that provided a good response, and she has been fairly compliant with her custom orthotics. Pain is now moderate, persistent without radiation.  Past medical history, Surgical history, Family history not pertinant except as noted below, Social history, Allergies, and medications have been entered into the medical record, reviewed, and no changes needed.   Review of Systems: No fevers, chills, night sweats, weight loss, chest pain, or shortness of breath.   Objective:    General: Well Developed, well nourished, and in no acute distress.  Neuro: Alert and oriented x3, extra-ocular muscles intact, sensation grossly intact.  HEENT: Normocephalic, atraumatic, pupils equal round reactive to light, neck supple, no masses, no lymphadenopathy, thyroid nonpalpable.  Skin: Warm and dry, no rashes. Cardiac: Regular rate and rhythm, no murmurs rubs or gallops, no lower extremity edema.  Respiratory: Clear to auscultation bilaterally. Not using accessory muscles, speaking in full sentences. Left Foot: Visibly swollen with tenderness to palpation and palpable synovitis of the left first metatarsophalangeal joint. Range of motion is full in all directions. Strength is 5/5 in all directions. No hallux valgus. No pes cavus or pes planus. No abnormal callus noted. No pain over the navicular prominence, or base of fifth metatarsal. No tenderness to palpation of the calcaneal insertion of plantar fascia. No pain at the Achilles insertion. No pain over the calcaneal bursa. No pain of the retrocalcaneal bursa. No tenderness to palpation over the tarsals, metatarsals, or phalanges. No hallux rigidus or limitus. No tenderness palpation over interphalangeal  joints. No pain with compression of the metatarsal heads. Neurovascularly intact distally.  Procedure: Real-time Ultrasound Guided Injection of left first MTP Device: GE Logiq E  Verbal informed consent obtained.  Time-out conducted.  Noted no overlying erythema, induration, or other signs of local infection.  Skin prepped in a sterile fashion.  Local anesthesia: Topical Ethyl chloride.  With sterile technique and under real time ultrasound guidance: Noted copious synovitis without an effusion at the MTP, 25-gauge needle advanced into the joint and 0.5 mL kenalog 40, 0.5 mL lidocaine injected easily.  Completed without difficulty  Pain immediately resolved suggesting accurate placement of the medication.  Advised to call if fevers/chills, erythema, induration, drainage, or persistent bleeding.  Images permanently stored and available for review in the ultrasound unit.  Impression: Technically successful ultrasound guided injection.  First metatarsal ray post placed in her left orthotic.  Impression and Recommendations:

## 2014-06-26 ENCOUNTER — Ambulatory Visit: Payer: Self-pay | Admitting: Family Medicine

## 2014-06-27 ENCOUNTER — Ambulatory Visit: Payer: Self-pay | Admitting: Family Medicine

## 2014-07-05 ENCOUNTER — Encounter: Payer: Self-pay | Admitting: Family Medicine

## 2014-07-05 ENCOUNTER — Ambulatory Visit (INDEPENDENT_AMBULATORY_CARE_PROVIDER_SITE_OTHER): Payer: PRIVATE HEALTH INSURANCE | Admitting: Family Medicine

## 2014-07-05 VITALS — BP 113/63 | HR 79 | Wt 125.0 lb

## 2014-07-05 DIAGNOSIS — F329 Major depressive disorder, single episode, unspecified: Secondary | ICD-10-CM

## 2014-07-05 DIAGNOSIS — F411 Generalized anxiety disorder: Secondary | ICD-10-CM

## 2014-07-05 DIAGNOSIS — K2100 Gastro-esophageal reflux disease with esophagitis, without bleeding: Secondary | ICD-10-CM | POA: Insufficient documentation

## 2014-07-05 DIAGNOSIS — F32A Depression, unspecified: Secondary | ICD-10-CM

## 2014-07-05 MED ORDER — LANSOPRAZOLE 30 MG PO CPDR: 30.0000 mg | DELAYED_RELEASE_CAPSULE | Freq: Two times a day (BID) | ORAL | Status: DC

## 2014-07-05 MED ORDER — ESCITALOPRAM OXALATE 10 MG PO TABS: 10.0000 mg | ORAL_TABLET | Freq: Every day | ORAL | Status: DC

## 2014-07-05 NOTE — Assessment & Plan Note (Signed)
Tried prozac, paxil and cymbalta in the past and had neg S.E.

## 2014-07-05 NOTE — Progress Notes (Signed)
   Subjective:    Patient ID: Robin Conrad, female    DOB: March 16, 1950, 64 y.o.   MRN: [id number]  HPI GAD/depression - She has been feeling very rushed.  She has been drawing to calm herself down. She finds herself more irritable.  She is taking her medicatoins regularly.  Tried prozac, paxil and cymbalta in the past and had neg S.E. she just feels constantly anxious. She does involve the middle of the afternoon show no nystagmus has a tremor because she feels nervous. She has been going to therapy every 3 weeks and that has been working out well. Really she really feels like she misses out on adult time. She says a lot of her time taking care of her grandchildren. One of which she has special needs. She has a second daughter who is bipolar and has a lot of addiction issues. Her husband is not very involved and supportive in her life. She really just does not have any close relationships with other adults and is missing this.  GERD-she has an appointment for follow-up with Dr. Joya Gaskins next month. She ran out of her Prevacid and it was not refilled. She wanted to know we would be willing to refill until she sees Dr. Joya Gaskins next month. Since she's been off of that she's tried several over-the-counter products and has not been nearly as effective.  Review of Systems     Objective:   Physical Exam  Constitutional: She is oriented to person, place, and time. She appears well-developed and well-nourished.  HENT:  Head: Normocephalic and atraumatic.  Cardiovascular: Normal rate, regular rhythm and normal heart sounds.   Pulmonary/Chest: Effort normal and breath sounds normal.  Neurological: She is alert and oriented to person, place, and time.  Skin: Skin is warm and dry.  Psychiatric: She has a normal mood and affect. Her behavior is normal.          Assessment & Plan:  GAD/Depression - we discussed different options. She really wants to continue the Wellbutrin. She is Re: On the maximum dose.  This has worked fairly well for her for years. We discussed possibly switching the citalopram to something else like Effexor. She was a little bit nervous about this. Could consider switching to Lexapro just to see if it makes a difference she was willing to do this since it's a very similar drug. I'll see her back in 6 weeks. And she has an appointment in a couple of weeks with [place] her therapist.  GERD-will refill the Prevacid for now. Keep appointment with Dr. Joya Gaskins.  Time spent 25 minutes, greater than 50% time spent counseling about anxiety, depression

## 2014-07-08 ENCOUNTER — Other Ambulatory Visit: Payer: Self-pay | Admitting: Family Medicine

## 2014-07-11 ENCOUNTER — Ambulatory Visit (INDEPENDENT_AMBULATORY_CARE_PROVIDER_SITE_OTHER): Payer: PRIVATE HEALTH INSURANCE | Admitting: Licensed Clinical Social Worker

## 2014-07-11 DIAGNOSIS — F419 Anxiety disorder, unspecified: Secondary | ICD-10-CM

## 2014-07-11 DIAGNOSIS — F3341 Major depressive disorder, recurrent, in partial remission: Secondary | ICD-10-CM | POA: Diagnosis not present

## 2014-08-01 ENCOUNTER — Ambulatory Visit (INDEPENDENT_AMBULATORY_CARE_PROVIDER_SITE_OTHER): Payer: PRIVATE HEALTH INSURANCE | Admitting: Licensed Clinical Social Worker

## 2014-08-01 ENCOUNTER — Ambulatory Visit: Payer: Self-pay | Admitting: Critical Care Medicine

## 2014-08-01 DIAGNOSIS — F419 Anxiety disorder, unspecified: Secondary | ICD-10-CM | POA: Diagnosis not present

## 2014-08-01 DIAGNOSIS — F3341 Major depressive disorder, recurrent, in partial remission: Secondary | ICD-10-CM

## 2014-08-16 ENCOUNTER — Ambulatory Visit: Payer: Self-pay | Admitting: Family Medicine

## 2014-08-19 ENCOUNTER — Other Ambulatory Visit: Payer: Self-pay | Admitting: Family Medicine

## 2014-08-20 ENCOUNTER — Other Ambulatory Visit: Payer: Self-pay | Admitting: Family Medicine

## 2014-08-20 ENCOUNTER — Ambulatory Visit (INDEPENDENT_AMBULATORY_CARE_PROVIDER_SITE_OTHER): Payer: PRIVATE HEALTH INSURANCE | Admitting: Family Medicine

## 2014-08-20 ENCOUNTER — Encounter: Payer: Self-pay | Admitting: Family Medicine

## 2014-08-20 VITALS — BP 95/56 | HR 77 | Ht 66.0 in | Wt 124.0 lb

## 2014-08-20 DIAGNOSIS — Z1231 Encounter for screening mammogram for malignant neoplasm of breast: Secondary | ICD-10-CM

## 2014-08-20 DIAGNOSIS — F329 Major depressive disorder, single episode, unspecified: Secondary | ICD-10-CM

## 2014-08-20 DIAGNOSIS — R238 Other skin changes: Secondary | ICD-10-CM | POA: Diagnosis not present

## 2014-08-20 DIAGNOSIS — M797 Fibromyalgia: Secondary | ICD-10-CM | POA: Diagnosis not present

## 2014-08-20 DIAGNOSIS — F418 Other specified anxiety disorders: Secondary | ICD-10-CM

## 2014-08-20 DIAGNOSIS — F419 Anxiety disorder, unspecified: Secondary | ICD-10-CM

## 2014-08-20 DIAGNOSIS — Z974 Presence of external hearing-aid: Secondary | ICD-10-CM

## 2014-08-20 DIAGNOSIS — F32A Depression, unspecified: Secondary | ICD-10-CM

## 2014-08-20 DIAGNOSIS — R233 Spontaneous ecchymoses: Secondary | ICD-10-CM

## 2014-08-20 MED ORDER — ZOLPIDEM TARTRATE 5 MG PO TABS: ORAL_TABLET | ORAL | Status: DC

## 2014-08-20 NOTE — Progress Notes (Signed)
   Subjective:    Patient ID: Robin Conrad, female    DOB: 02/25/1951, 64 y.o.   MRN: [id number]  HPI Anxiety/depression - Says switching to the lexapro has really helped her mood. Says has been more constipated on the medication.  Says gets very bloated. Trying to change her bowel regimen to adjust. Admits she needs to drink more water.   Saw allergist and was dx with thrush. She does use the symbicort.   She rinses her mouth and measures her teeth or gum she uses it and she uses a spacer.  She has noticed some easy bruising. One of her other physicians did some blood work that she has not heard back about results yet. She does take fish oil. She does not take any aspirin products.  She also wanted to update me let me know she now is wearing a hearing aid in her right ear.  Review of Systems     Objective:   Physical Exam  Constitutional: She is oriented to person, place, and time. She appears well-developed and well-nourished.  HENT:  Head: Normocephalic and atraumatic.  Cardiovascular: Normal rate, regular rhythm and normal heart sounds.   Pulmonary/Chest: Effort normal and breath sounds normal.  Neurological: She is alert and oriented to person, place, and time.  Skin: Skin is warm and dry.  Psychiatric: She has a normal mood and affect. Her behavior is normal.          Assessment & Plan:  Anxiety/depression - Improved.  Still seeing [name] for counseling.  Overall like she's doing a little better. Continue current regimen. Hopefully she can adjust her bowel regimen for her chronic constipation with the new Lexapro. Follow-up in 2-3 months.  Fibromyalgia-encourage her to continue to be as active as she is able to. She does still keep her grandkids which can be taxing at times.  Easy bruising-she plans on calling to the results of her recent blood work. In addition recommend that she stop the fish oil. She actually eats fish twice a week. As long she continues to be physically  active but I do not think she needs to take it.  Time spent 30 min, > 50% spent counseling about anxiety/depression, fiber myalgia, bruising.

## 2014-08-22 ENCOUNTER — Ambulatory Visit (INDEPENDENT_AMBULATORY_CARE_PROVIDER_SITE_OTHER): Payer: PRIVATE HEALTH INSURANCE | Admitting: Licensed Clinical Social Worker

## 2014-08-22 DIAGNOSIS — F419 Anxiety disorder, unspecified: Secondary | ICD-10-CM | POA: Diagnosis not present

## 2014-08-22 DIAGNOSIS — F3341 Major depressive disorder, recurrent, in partial remission: Secondary | ICD-10-CM | POA: Diagnosis not present

## 2014-09-07 IMAGING — CR DG WRIST COMPLETE 3+V*L*
2 series · 2 of 2 positions shown · non-contrast
Comparison: None.

CLINICAL DATA: Chronic left radiocarpal pain

EXAM:
LEFT WRIST - COMPLETE 3+ VIEW

[view not recorded (1 of 2)]
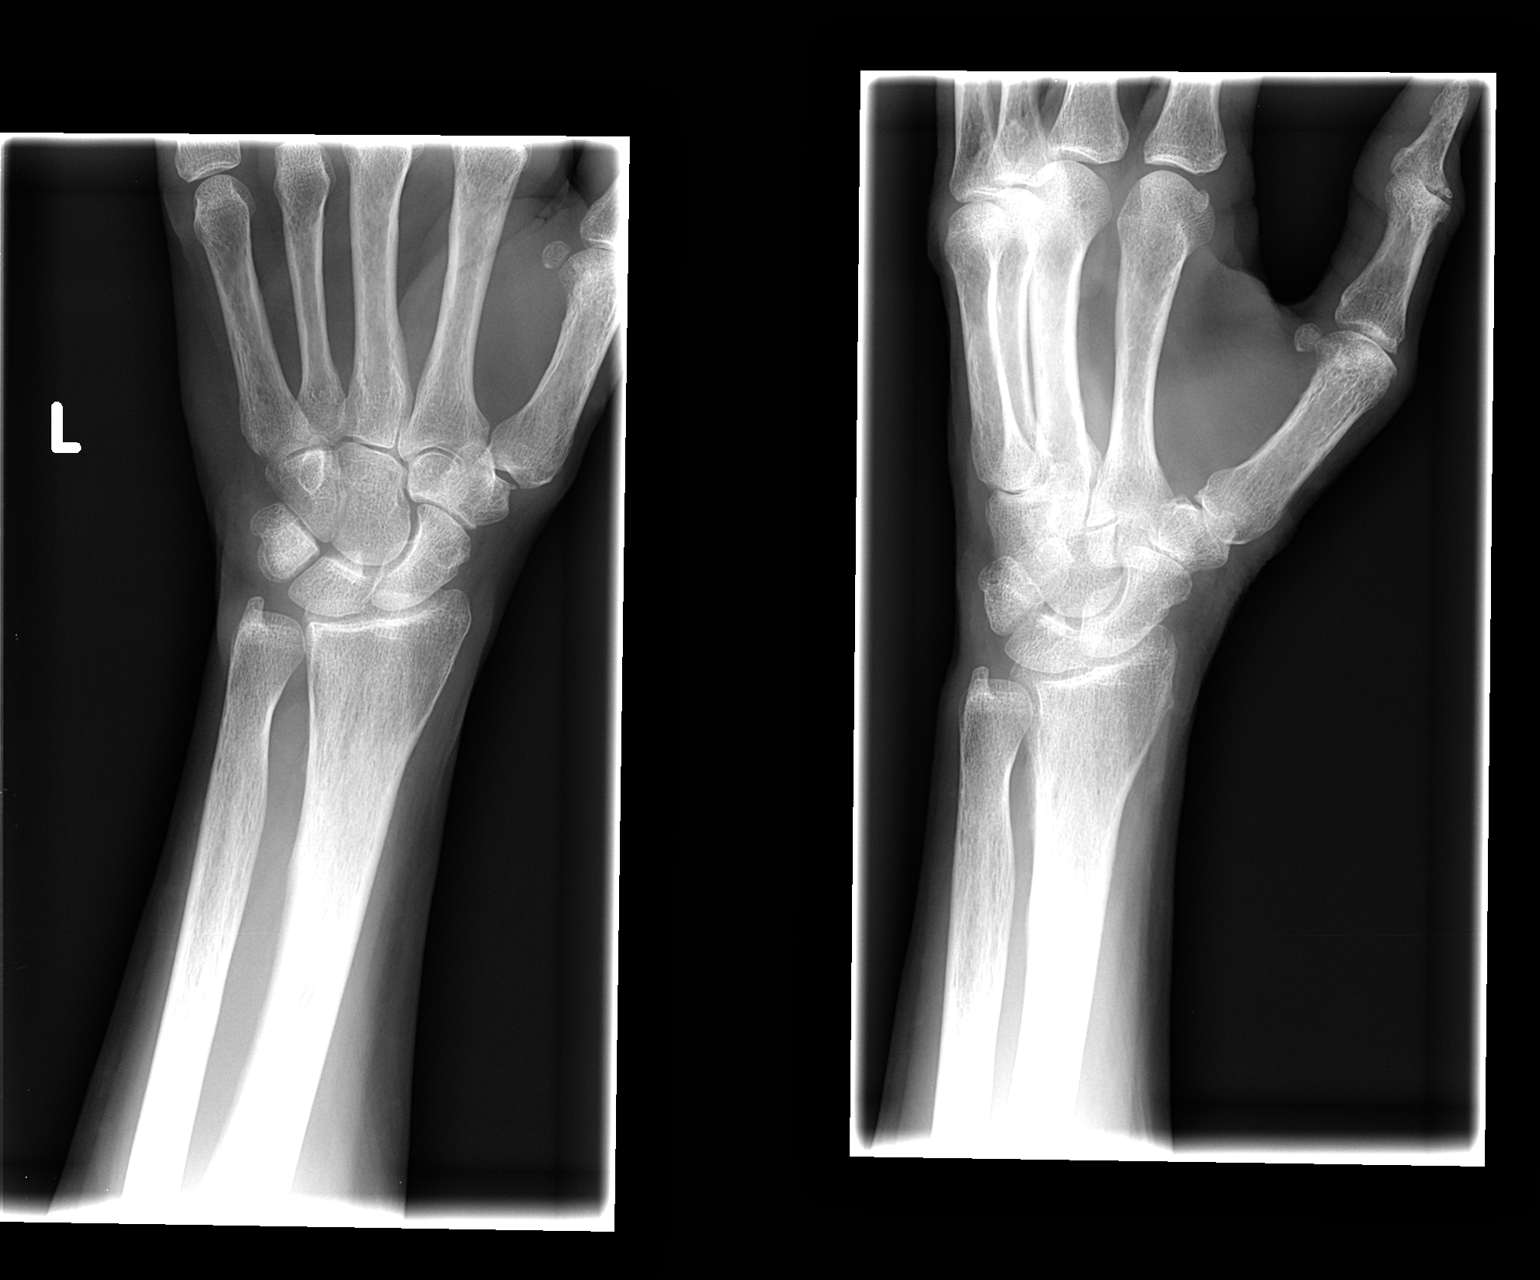

[view not recorded (2 of 2)]
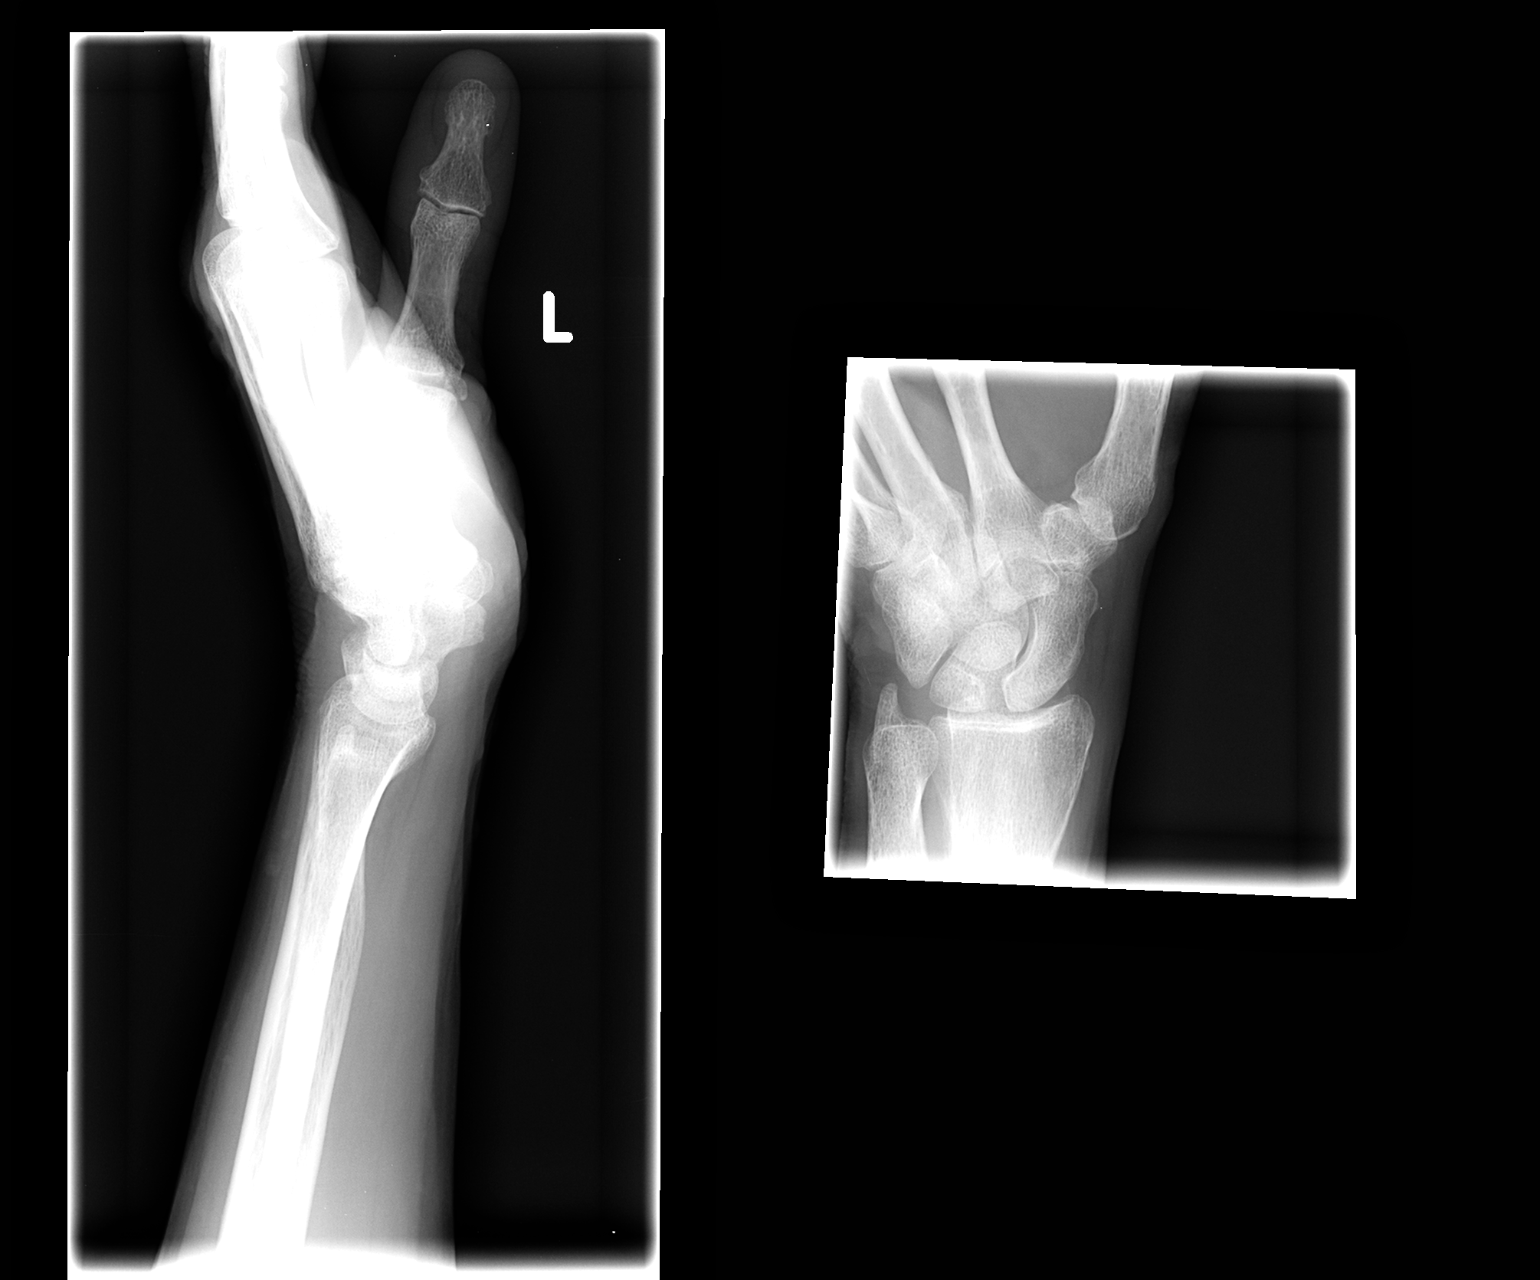

[2 of 2 positions shown; findings below may reference images not displayed]

FINDINGS: Minimal osteoarthritic change involving the first carpal/metacarpal
joint. No other abnormalities.
IMPRESSION: No acute findings

## 2014-09-09 ENCOUNTER — Other Ambulatory Visit: Payer: Self-pay | Admitting: Family Medicine

## 2014-09-09 NOTE — Telephone Encounter (Signed)
Received fax from pharmacy for Xanax Rx refill. Looking at the notes from 04/23/14, Pt was advised to "hold her xanax when takes the Ambien. Avoid taking 2 depressants."  Patient has not been holding this Rx, states she still takes it TID and needs a refill. Will route to Provider for review.

## 2014-09-10 MED ORDER — ALPRAZOLAM 0.25 MG PO TABS: ORAL_TABLET | ORAL | Status: DC

## 2014-09-10 NOTE — Telephone Encounter (Signed)
Robin Conrad, Rx on your desk.

## 2014-09-12 ENCOUNTER — Encounter: Payer: Self-pay | Admitting: Pulmonary Disease

## 2014-09-12 ENCOUNTER — Ambulatory Visit (INDEPENDENT_AMBULATORY_CARE_PROVIDER_SITE_OTHER): Payer: PRIVATE HEALTH INSURANCE | Admitting: Licensed Clinical Social Worker

## 2014-09-12 ENCOUNTER — Ambulatory Visit (INDEPENDENT_AMBULATORY_CARE_PROVIDER_SITE_OTHER): Payer: PRIVATE HEALTH INSURANCE | Admitting: Pulmonary Disease

## 2014-09-12 VITALS — BP 97/63 | HR 97 | Ht 66.0 in | Wt 125.0 lb

## 2014-09-12 DIAGNOSIS — J4540 Moderate persistent asthma, uncomplicated: Secondary | ICD-10-CM

## 2014-09-12 DIAGNOSIS — F3341 Major depressive disorder, recurrent, in partial remission: Secondary | ICD-10-CM

## 2014-09-12 DIAGNOSIS — R0602 Shortness of breath: Secondary | ICD-10-CM

## 2014-09-12 DIAGNOSIS — J9819 Other pulmonary collapse: Secondary | ICD-10-CM

## 2014-09-12 DIAGNOSIS — F419 Anxiety disorder, unspecified: Secondary | ICD-10-CM

## 2014-09-12 NOTE — Patient Instructions (Signed)
Lung function is good Drop symbicort to 1 puff twice daily- RINSE mouth after use In September , drop to one puff daily Can stop in November Use albuterol as needed Refills on prevacid will be sent

## 2014-09-12 NOTE — Assessment & Plan Note (Signed)
Lung function is good - attempt step down Drop symbicort to 1 puff twice daily- RINSE mouth after use In September , drop to one puff daily Can stop in November Use albuterol as needed Refills on prevacid will be sent

## 2014-09-12 NOTE — Assessment & Plan Note (Signed)
resolved 

## 2014-09-12 NOTE — Progress Notes (Signed)
   Subjective:    Patient ID: Robin Conrad, female    DOB: Feb 03, 1951, 64 y.o.   MRN: [id number]  HPI  64 y.o never smoker for FU of mild persistent asthma triggered by GERD  Chief Complaint  Patient presents with  . Follow-up    breathing doing well.  Back of throat has thrush, wants to have throat looked at. tried GERD diet, hard to stay on the diet, but doing well.  In Asthma research study with [facility].       Last seen 03/2013 - enrolled in asthma study at [place] x many years  C/o hoarseness, skin bruises easy, had thrush few months ago - self tapered to once/day symbicort - does not need saba much   Significant tests/ events  CT angio 01/2012 partial collapse RML CT chest 06/2012 scarring in RML & lingula 08/2014 spirometry FEV1 82%, FVC 87%  Review of Systems neg for any significant sore throat, dysphagia, itching, sneezing, nasal congestion or excess/ purulent secretions, fever, chills, sweats, unintended wt loss, pleuritic or exertional cp, hempoptysis, orthopnea pnd or change in chronic leg swelling. Also denies presyncope, palpitations, heartburn, abdominal pain, nausea, vomiting, diarrhea or change in bowel or urinary habits, dysuria,hematuria, rash, arthralgias, visual complaints, headache, numbness weakness or ataxia.     Objective:   Physical Exam  Gen. Pleasant, well-nourished, in no distress ENT - no lesions, no post nasal drip Neck: No JVD, no thyromegaly, no carotid bruits Lungs: no use of accessory muscles, no dullness to percussion, clear without rales or rhonchi  Cardiovascular: Rhythm regular, heart sounds  normal, no murmurs or gallops, no peripheral edema Musculoskeletal: No deformities, no cyanosis or clubbing        Assessment & Plan:

## 2014-09-25 ENCOUNTER — Ambulatory Visit (INDEPENDENT_AMBULATORY_CARE_PROVIDER_SITE_OTHER): Payer: PRIVATE HEALTH INSURANCE

## 2014-09-25 DIAGNOSIS — Z1231 Encounter for screening mammogram for malignant neoplasm of breast: Secondary | ICD-10-CM | POA: Diagnosis not present

## 2014-09-25 IMAGING — MG MM DIGITAL SCREENING
5 series · 5 of 5 positions shown · non-contrast
Comparison: Previous exam(s).

CLINICAL DATA: Screening.

EXAM:
DIGITAL SCREENING BILATERAL MAMMOGRAM WITH CAD

[R CC]
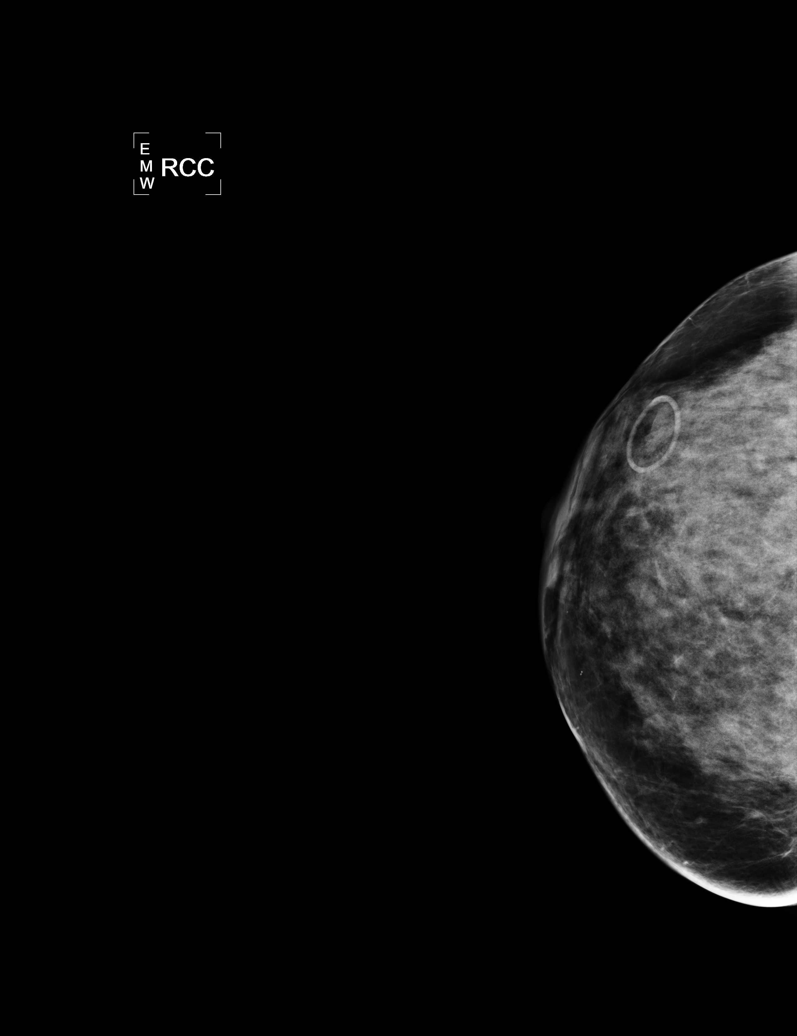

[L CC]
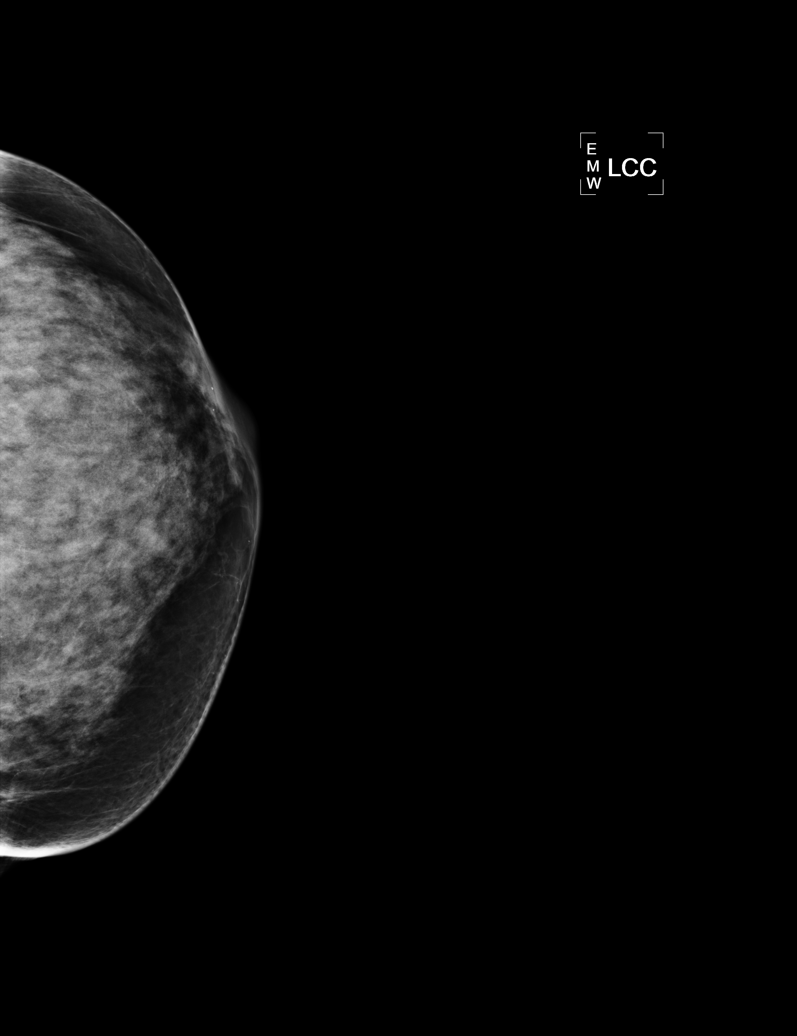

[L MLO]
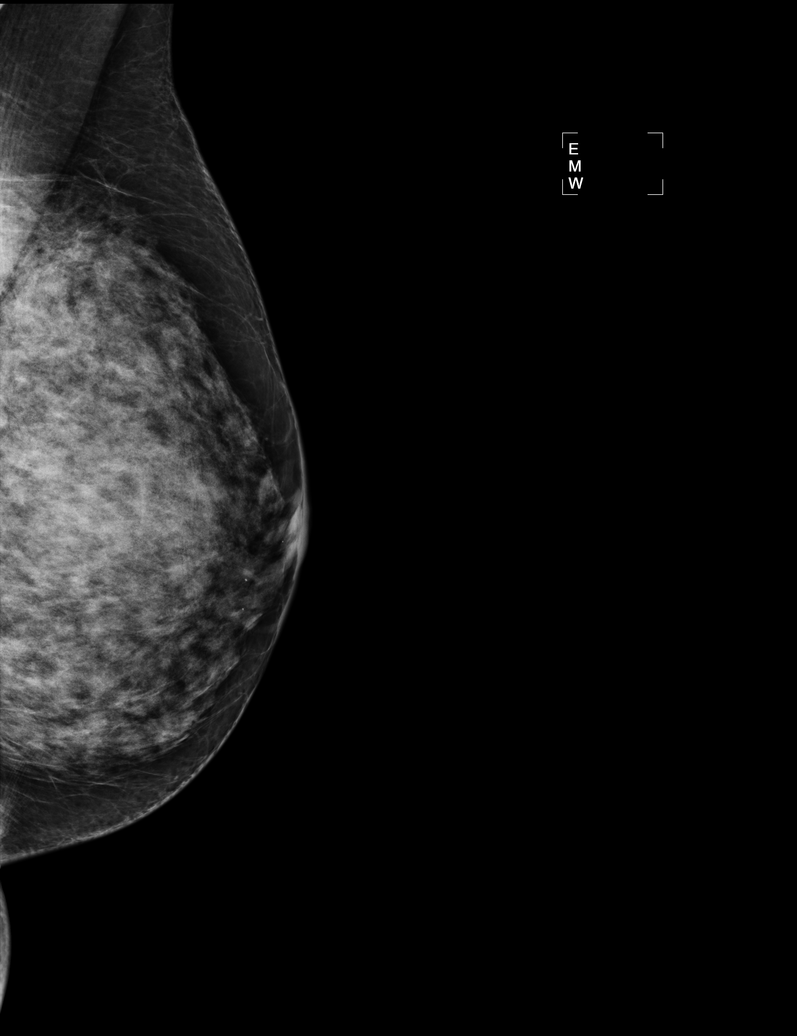

[R MLO]
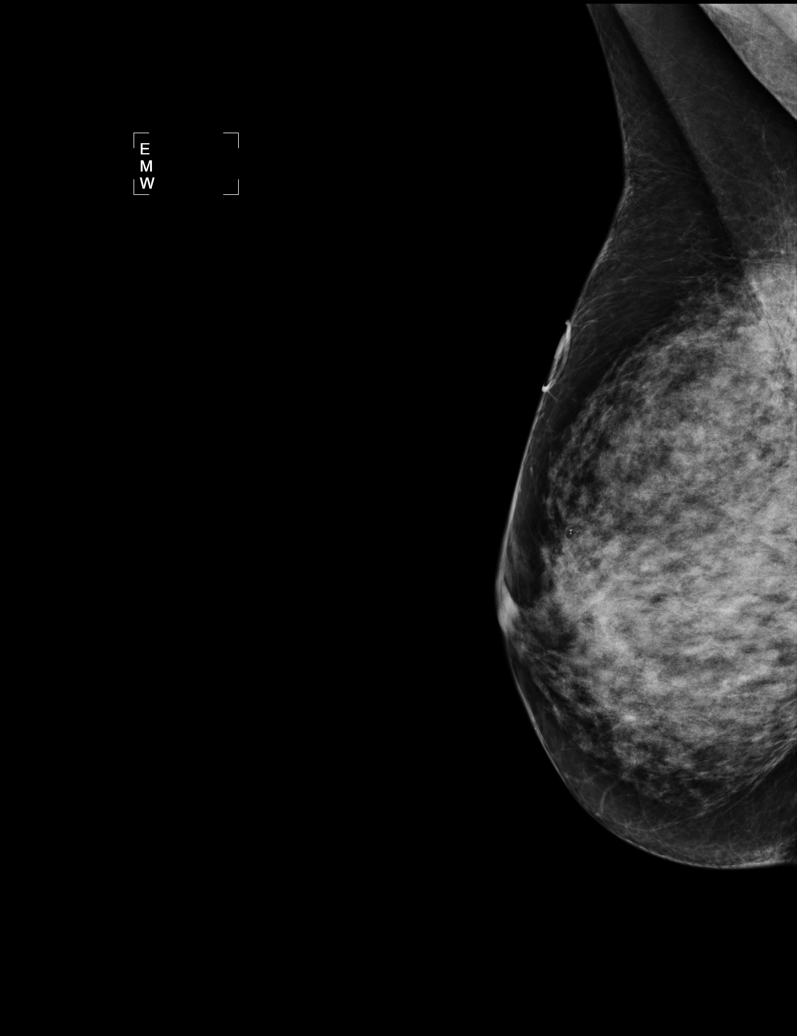

[R XCCL]
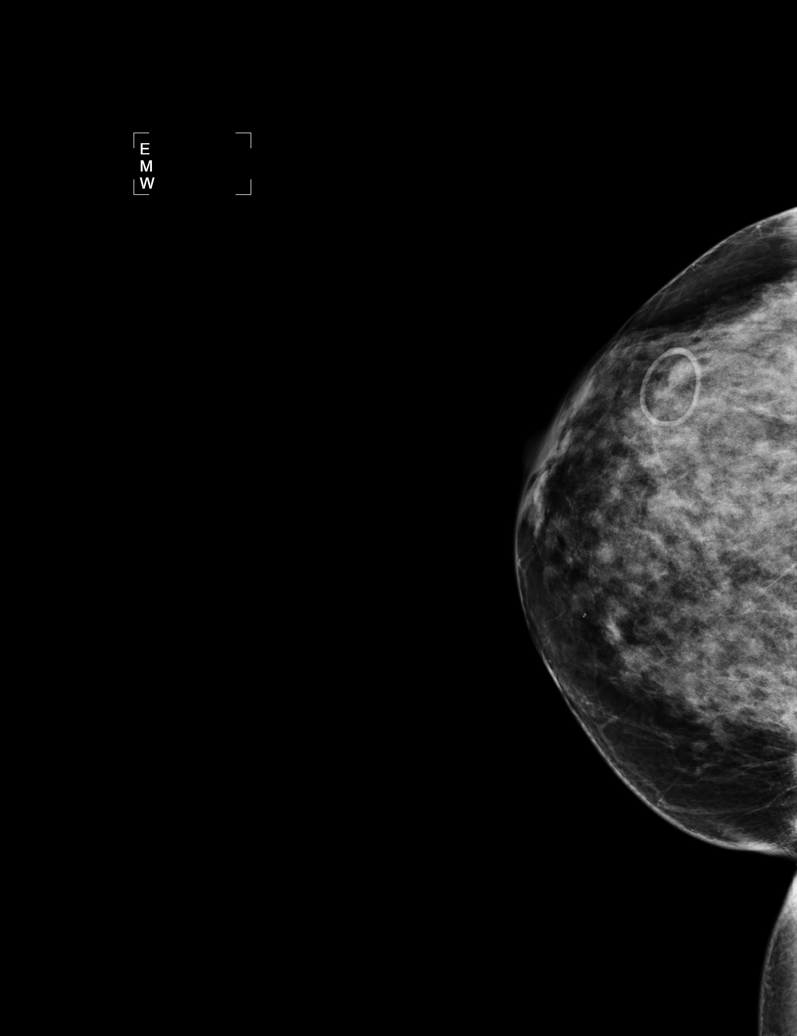

[5 of 5 positions shown; findings below may reference images not displayed]

ACR Breast Density Category d: The breast tissue is extremely dense,
which lowers the sensitivity of mammography.
FINDINGS: There are no findings suspicious for malignancy. Images were
processed with CAD.
IMPRESSION: No mammographic evidence of malignancy. A result letter of this
screening mammogram will be mailed directly to the patient.

RECOMMENDATION:
Screening mammogram in one year. (Code:[ZH])

BI-RADS CATEGORY  1: Negative.

## 2014-10-01 ENCOUNTER — Ambulatory Visit: Payer: PRIVATE HEALTH INSURANCE | Admitting: Licensed Clinical Social Worker

## 2014-10-03 ENCOUNTER — Other Ambulatory Visit: Payer: Self-pay | Admitting: Family Medicine

## 2014-10-10 ENCOUNTER — Telehealth: Payer: Self-pay | Admitting: Pulmonary Disease

## 2014-10-10 NOTE — Telephone Encounter (Signed)
[id number] x1 

## 2014-10-11 MED ORDER — LANSOPRAZOLE 30 MG PO CPDR: 30.0000 mg | DELAYED_RELEASE_CAPSULE | Freq: Two times a day (BID) | ORAL | Status: DC

## 2014-10-11 NOTE — Telephone Encounter (Signed)
Rx sent  I spoke with the pt and notified rx was sent  Nothing further needed

## 2014-10-14 ENCOUNTER — Other Ambulatory Visit: Payer: Self-pay | Admitting: Family Medicine

## 2014-10-15 ENCOUNTER — Ambulatory Visit (INDEPENDENT_AMBULATORY_CARE_PROVIDER_SITE_OTHER): Payer: PRIVATE HEALTH INSURANCE | Admitting: Licensed Clinical Social Worker

## 2014-10-15 DIAGNOSIS — F419 Anxiety disorder, unspecified: Secondary | ICD-10-CM | POA: Diagnosis not present

## 2014-10-15 DIAGNOSIS — F3341 Major depressive disorder, recurrent, in partial remission: Secondary | ICD-10-CM | POA: Diagnosis not present

## 2014-10-16 ENCOUNTER — Other Ambulatory Visit: Payer: Self-pay | Admitting: Family Medicine

## 2014-11-07 ENCOUNTER — Other Ambulatory Visit: Payer: Self-pay | Admitting: Family Medicine

## 2014-11-07 MED ORDER — TRAMADOL HCL 50 MG PO TABS: ORAL_TABLET | ORAL | Status: DC

## 2014-11-08 ENCOUNTER — Other Ambulatory Visit: Payer: Self-pay | Admitting: Family Medicine

## 2014-11-12 ENCOUNTER — Telehealth: Payer: Self-pay | Admitting: Family Medicine

## 2014-11-12 MED ORDER — LEVOTHYROXINE SODIUM 50 MCG PO TABS: 50.0000 ug | ORAL_TABLET | Freq: Every day | ORAL | Status: DC

## 2014-11-12 NOTE — Telephone Encounter (Signed)
Pt needs labs completed at upcoming appt with PCP.

## 2014-11-14 ENCOUNTER — Ambulatory Visit (INDEPENDENT_AMBULATORY_CARE_PROVIDER_SITE_OTHER): Payer: PRIVATE HEALTH INSURANCE | Admitting: Licensed Clinical Social Worker

## 2014-11-14 DIAGNOSIS — F419 Anxiety disorder, unspecified: Secondary | ICD-10-CM

## 2014-11-14 DIAGNOSIS — F3341 Major depressive disorder, recurrent, in partial remission: Secondary | ICD-10-CM

## 2014-11-21 ENCOUNTER — Ambulatory Visit (INDEPENDENT_AMBULATORY_CARE_PROVIDER_SITE_OTHER): Payer: PRIVATE HEALTH INSURANCE | Admitting: Family Medicine

## 2014-11-21 ENCOUNTER — Encounter: Payer: Self-pay | Admitting: Family Medicine

## 2014-11-21 VITALS — BP 97/61 | HR 78 | Temp 97.8°F | Wt 128.0 lb

## 2014-11-21 DIAGNOSIS — F411 Generalized anxiety disorder: Secondary | ICD-10-CM

## 2014-11-21 DIAGNOSIS — M797 Fibromyalgia: Secondary | ICD-10-CM

## 2014-11-21 DIAGNOSIS — Z23 Encounter for immunization: Secondary | ICD-10-CM

## 2014-11-21 DIAGNOSIS — L03011 Cellulitis of right finger: Secondary | ICD-10-CM

## 2014-11-21 DIAGNOSIS — K5900 Constipation, unspecified: Secondary | ICD-10-CM | POA: Diagnosis not present

## 2014-11-21 DIAGNOSIS — K5909 Other constipation: Secondary | ICD-10-CM

## 2014-11-21 MED ORDER — SULFAMETHOXAZOLE-TRIMETHOPRIM 800-160 MG PO TABS: 1.0000 | ORAL_TABLET | Freq: Two times a day (BID) | ORAL | Status: DC

## 2014-11-21 MED ORDER — MUPIROCIN 2 % EX OINT: TOPICAL_OINTMENT | Freq: Two times a day (BID) | CUTANEOUS | Status: DC

## 2014-11-21 NOTE — Progress Notes (Signed)
   Subjective:    Patient ID: Robin Conrad, female    DOB: July 04, 1950, 64 y.o.   MRN: [id number]  HPI GAD/Depression -  Doing well overall. Still seeing [name] for counseling monthly. On Lexapro and Wellbutrin. Happy with her current regimen. She feels more constipated and  Her belly is more hard and full. Takes her colace.  She has taken the amitiza and linzess and caused bloating.  She took Miralax for year.    follow-up fibromyalgia- she is doing OK. Says had a flare a couple of weeks ago. Had to take some naps.  She is doing some better this week.  Also c/o of infection on her first finger with redness. Says has been battling this on and off x 1 year. Will drain puss at time.  Says tries to keep the nail trimmed.  Says it usually starts in her cuticle. Started to flare up a few days ago.  No active drainage.      Review of Systems     Objective:   Physical Exam  Constitutional: She is oriented to person, place, and time. She appears well-developed and well-nourished.  HENT:  Head: Normocephalic and atraumatic.  Cardiovascular: Normal rate, regular rhythm and normal heart sounds.   Pulmonary/Chest: Effort normal and breath sounds normal.  Neurological: She is alert and oriented to person, place, and time.  Skin: Skin is warm and dry.  Some redness around the base of the cuticle on the first finger on the right hand. No active drainage. Nose induration. I don't see any apparent cracks in the skin.  Psychiatric: She has a normal mood and affect. Her behavior is normal.          Assessment & Plan:  GAD/Depression -  Doing well overall. I think we have done a good medication regimen for her and I think seeing [name] the therapist has been helpful for her as well. Her husband will be retiring probably in the spraying. She's a little bit nervous about that. She feels like over the years they have started to grow apart and they may have to start all over getting to know each other again. Gad  7 score of 8.  She rates her symptoms is somewhat difficult.  Constipation - use her stimulant laxative for a few days to get bowels cleaned out then start miralax daily.   I think if she tries this it may reduce any of the bloating that she has experienced in the past with using the MiraLAX.   fibromyalgia- on SSRI and doing well overall. Certainly when she pushes herself she does seem to flare. I think she has found a good balance overall. Just encourage her to really listen to her body and not pressure self too hard.   Paronychia- did not see anything that was quite amenable to drainage. No fluctuance and no active drainage. Right now it's just a little red along the nail border. I think we can treat this aggressively with an oral antibiotic and topical mupirocin. Avoid trimming or pushing back the cuticles. And avoid harsh chemicals. Encouraged her to use gloves to avoid contact with harsh cleaning products which can irritate the skin.

## 2014-11-22 ENCOUNTER — Other Ambulatory Visit: Payer: Self-pay | Admitting: Family Medicine

## 2014-12-04 ENCOUNTER — Telehealth: Payer: Self-pay | Admitting: *Deleted

## 2014-12-04 ENCOUNTER — Encounter: Payer: Self-pay | Admitting: Family Medicine

## 2014-12-04 ENCOUNTER — Ambulatory Visit (INDEPENDENT_AMBULATORY_CARE_PROVIDER_SITE_OTHER): Payer: PRIVATE HEALTH INSURANCE | Admitting: Family Medicine

## 2014-12-04 ENCOUNTER — Ambulatory Visit (INDEPENDENT_AMBULATORY_CARE_PROVIDER_SITE_OTHER): Payer: PRIVATE HEALTH INSURANCE

## 2014-12-04 VITALS — BP 126/72 | HR 96 | Wt 130.0 lb

## 2014-12-04 DIAGNOSIS — M5442 Lumbago with sciatica, left side: Secondary | ICD-10-CM

## 2014-12-04 DIAGNOSIS — M5136 Other intervertebral disc degeneration, lumbar region: Secondary | ICD-10-CM | POA: Diagnosis not present

## 2014-12-04 DIAGNOSIS — M47896 Other spondylosis, lumbar region: Secondary | ICD-10-CM | POA: Diagnosis not present

## 2014-12-04 DIAGNOSIS — M5416 Radiculopathy, lumbar region: Secondary | ICD-10-CM | POA: Insufficient documentation

## 2014-12-04 DIAGNOSIS — M545 Low back pain: Secondary | ICD-10-CM | POA: Insufficient documentation

## 2014-12-04 DIAGNOSIS — M4186 Other forms of scoliosis, lumbar region: Secondary | ICD-10-CM | POA: Diagnosis not present

## 2014-12-04 IMAGING — CR DG LUMBAR SPINE COMPLETE 4+V
5 series · 5 of 5 positions shown · non-contrast
Comparison: None.

CLINICAL DATA: Left low back pain for years.

EXAM:
LUMBAR SPINE - COMPLETE 4+ VIEW

[l-spine ap]
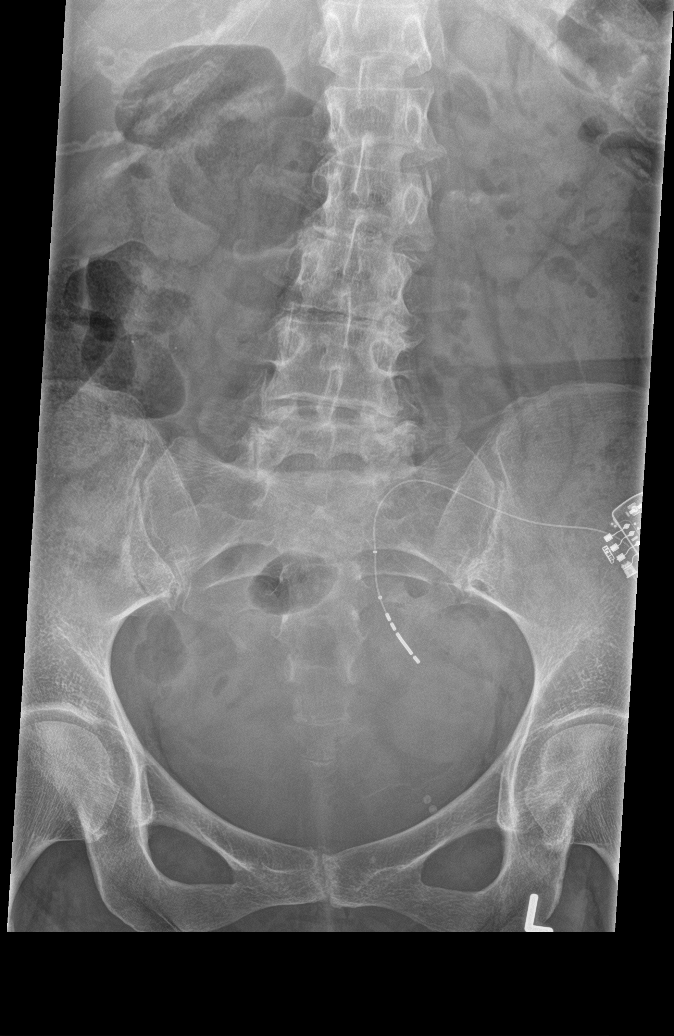

[l-spine obl (1 of 2)]
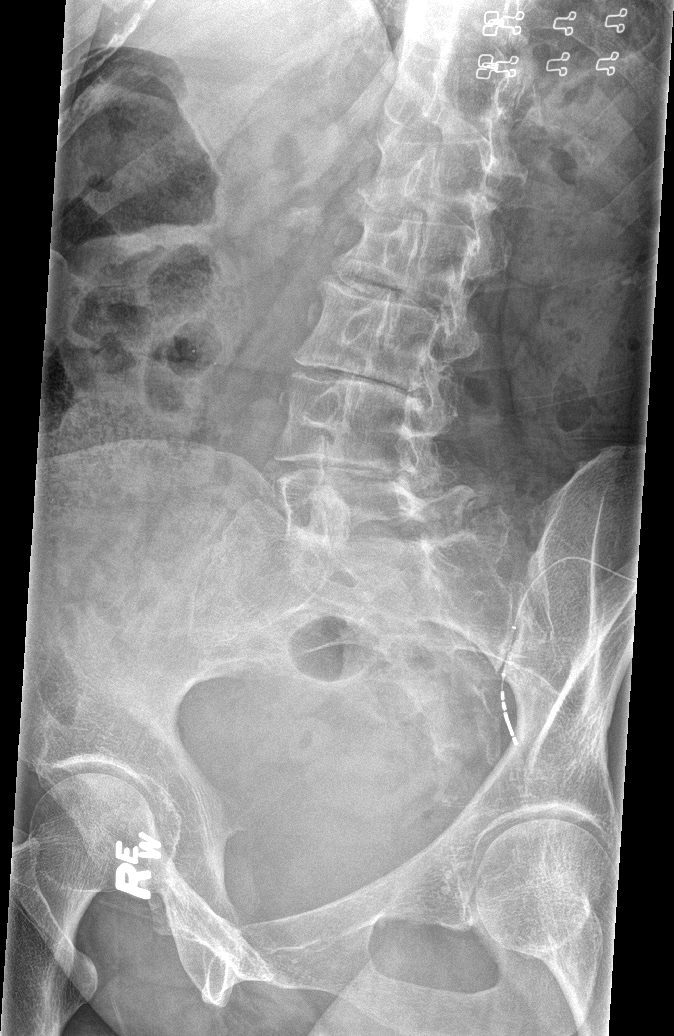

[l-spine obl (2 of 2)]
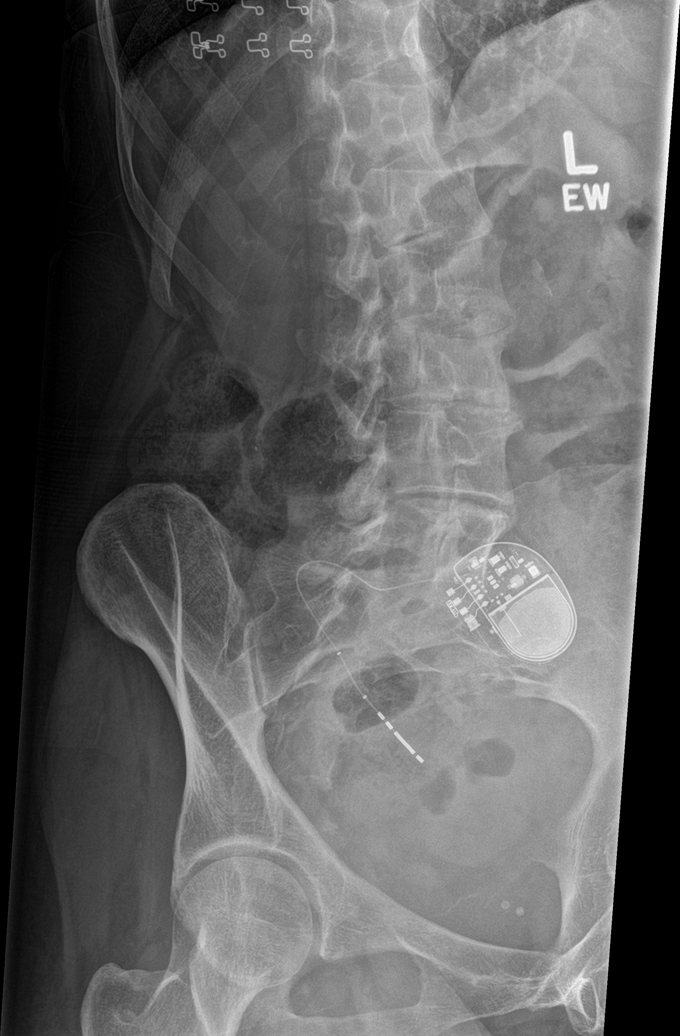

[l-spine lat]
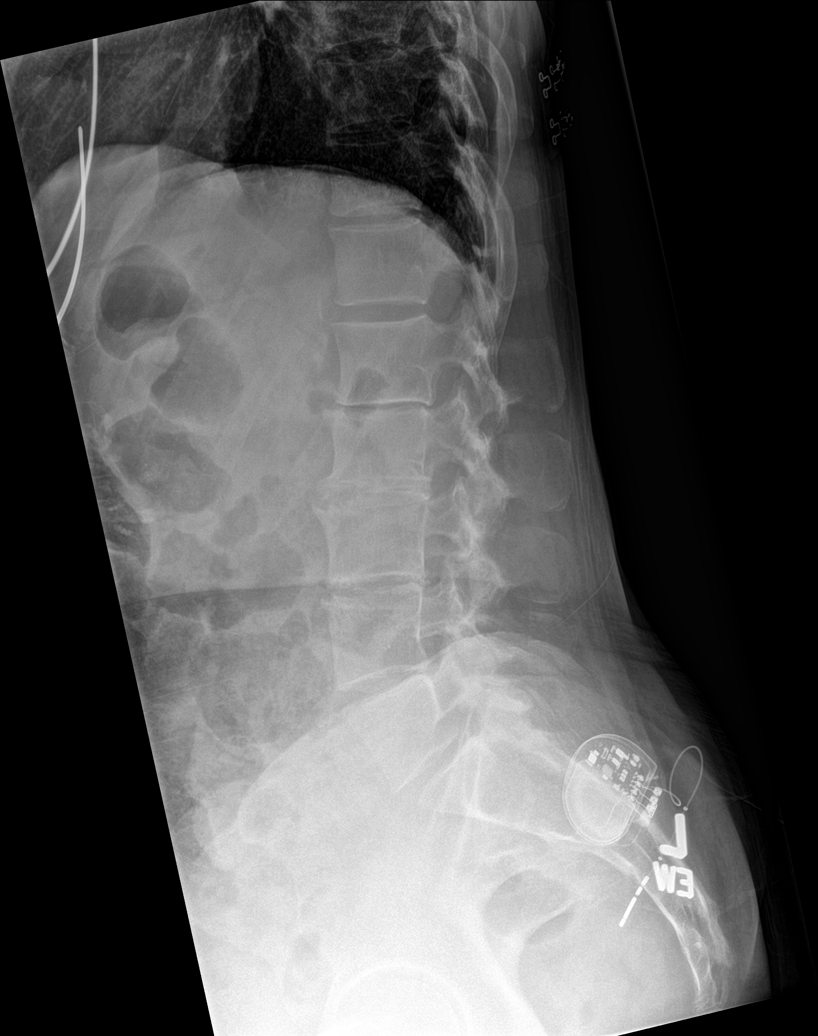

[l-spine spot]
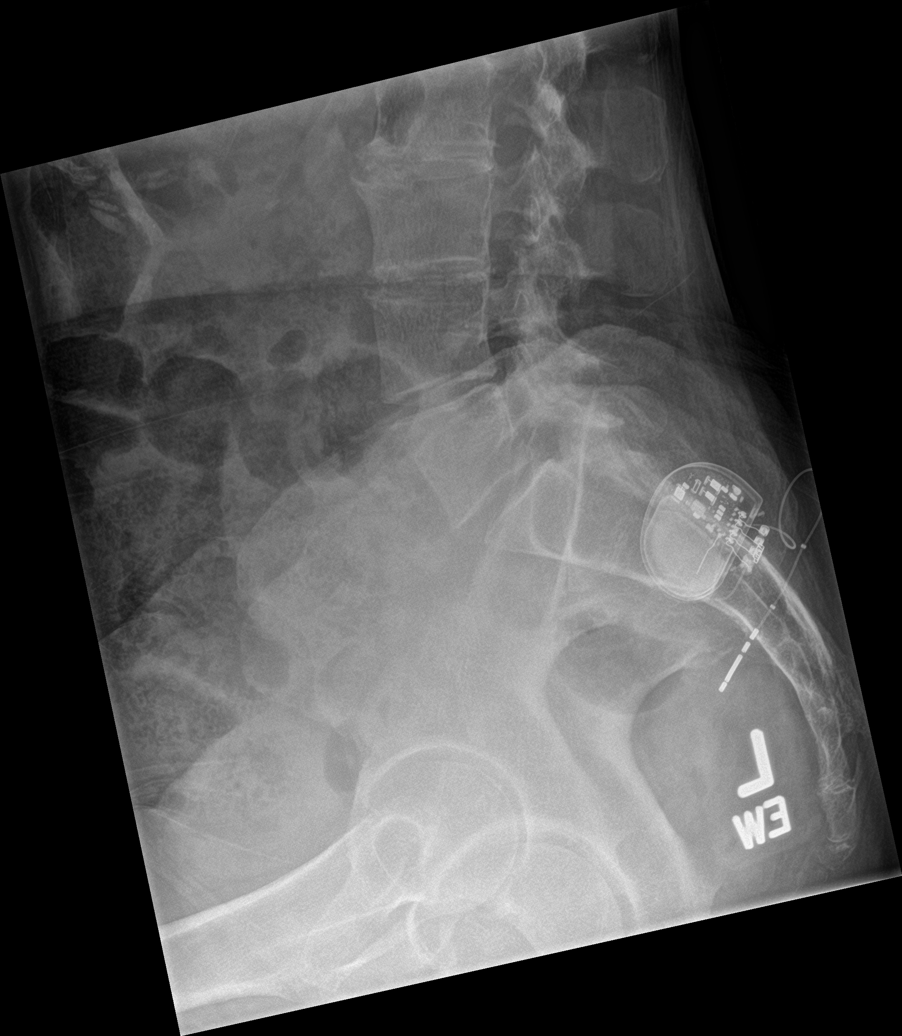

[5 of 5 positions shown; findings below may reference images not displayed]

FINDINGS: Left sacral plexus stimulator noted. 11 degrees of levoconvex lumbar
scoliosis between T12 and L 3. Mild rotary component.

Degenerative facet arthropathy bilaterally at L4-5 at L5-S1.
Thinning of the left L5 pars region but I do not see a definite pars
defect. 3 mm of grade 1 anterolisthesis at L5-S 1.

Loss of disc height at all lumbar spine levels but especially at
L1-2, L2- 3, and L3-4.

No fracture identified.
IMPRESSION: 1. Lumbar spondylosis, scoliosis, and degenerative disc disease.
Mild grade 1 degenerative anterolisthesis at L5-S 1.
2. Left sacral plexus stimulator noted.

## 2014-12-04 NOTE — Progress Notes (Signed)
Quick Note:  Xray shows a lot of arthritis. ______

## 2014-12-04 NOTE — Patient Instructions (Signed)
Thank you for coming in today. Come back or go to the emergency room if you notice new weakness new numbness problems walking or bowel or bladder problems. Work on home exercises. Use tramadol ibuprofen and Flexeril as needed. Use heating pad as needed. Get x-ray today.  Sciatica with Rehab The sciatic nerve runs from the back down the leg and is responsible for sensation and control of the muscles in the back (posterior) side of the thigh, lower leg, and foot. Sciatica is a condition that is characterized by inflammation of this nerve.  SYMPTOMS   Signs of nerve damage, including numbness and/or weakness along the posterior side of the lower extremity.  Pain in the back of the thigh that may also travel down the leg.  Pain that worsens when sitting for long periods of time.  Occasionally, pain in the back or buttock. CAUSES  Inflammation of the sciatic nerve is the cause of sciatica. The inflammation is due to something irritating the nerve. Common sources of irritation include:  Sitting for long periods of time.  Direct trauma to the nerve.  Arthritis of the spine.  Herniated or ruptured disk.  Slipping of the vertebrae (spondylolisthesis).  Pressure from soft tissues, such as muscles or ligament-like tissue (fascia). RISK INCREASES WITH:  Sports that place pressure or stress on the spine (football or weightlifting).  Poor strength and flexibility.  Failure to warm up properly before activity.  Family history of low back pain or disk disorders.  Previous back injury or surgery.  Poor body mechanics, especially when lifting, or poor posture. PREVENTION   Warm up and stretch properly before activity.  Maintain physical fitness:  Strength, flexibility, and endurance.  Cardiovascular fitness.  Learn and use proper technique, especially with posture and lifting. When possible, have coach correct improper technique.  Avoid activities that place stress on the  spine. PROGNOSIS If treated properly, then sciatica usually resolves within 6 weeks. However, occasionally surgery is necessary.  RELATED COMPLICATIONS   Permanent nerve damage, including pain, numbness, tingle, or weakness.  Chronic back pain.  Risks of surgery: infection, bleeding, nerve damage, or damage to surrounding tissues. TREATMENT Treatment initially involves resting from any activities that aggravate your symptoms. The use of ice and medication may help reduce pain and inflammation. The use of strengthening and stretching exercises may help reduce pain with activity. These exercises may be performed at home or with referral to a therapist. A therapist may recommend further treatments, such as transcutaneous electronic nerve stimulation (TENS) or ultrasound. Your caregiver may recommend corticosteroid injections to help reduce inflammation of the sciatic nerve. If symptoms persist despite non-surgical (conservative) treatment, then surgery may be recommended. MEDICATION  If pain medication is necessary, then nonsteroidal anti-inflammatory medications, such as aspirin and ibuprofen, or other minor pain relievers, such as acetaminophen, are often recommended.  Do not take pain medication for 7 days before surgery.  Prescription pain relievers may be given if deemed necessary by your caregiver. Use only as directed and only as much as you need.  Ointments applied to the skin may be helpful.  Corticosteroid injections may be given by your caregiver. These injections should be reserved for the most serious cases, because they may only be given a certain number of times. HEAT AND COLD  Cold treatment (icing) relieves pain and reduces inflammation. Cold treatment should be applied for 10 to 15 minutes every 2 to 3 hours for inflammation and pain and immediately after any activity that aggravates your symptoms.  Use ice packs or massage the area with a piece of ice (ice massage).  Heat  treatment may be used prior to performing the stretching and strengthening activities prescribed by your caregiver, physical therapist, or athletic trainer. Use a heat pack or soak the injury in warm water. SEEK MEDICAL CARE IF:  Treatment seems to offer no benefit, or the condition worsens.  Any medications produce adverse side effects. EXERCISES  RANGE OF MOTION (ROM) AND STRETCHING EXERCISES - Sciatica Most people with sciatic will find that their symptoms worsen with either excessive bending forward (flexion) or arching at the low back (extension). The exercises which will help resolve your symptoms will focus on the opposite motion. Your physician, physical therapist or athletic trainer will help you determine which exercises will be most helpful to resolve your low back pain. Do not complete any exercises without first consulting with your clinician. Discontinue any exercises which worsen your symptoms until you speak to your clinician. If you have pain, numbness or tingling which travels down into your buttocks, leg or foot, the goal of the therapy is for these symptoms to move closer to your back and eventually resolve. Occasionally, these leg symptoms will get better, but your low back pain may worsen; this is typically an indication of progress in your rehabilitation. Be certain to be very alert to any changes in your symptoms and the activities in which you participated in the 24 hours prior to the change. Sharing this information with your clinician will allow him/her to most efficiently treat your condition. These exercises may help you when beginning to rehabilitate your injury. Your symptoms may resolve with or without further involvement from your physician, physical therapist or athletic trainer. While completing these exercises, remember:   Restoring tissue flexibility helps normal motion to return to the joints. This allows healthier, less painful movement and activity.  An effective  stretch should be held for at least 30 seconds.  A stretch should never be painful. You should only feel a gentle lengthening or release in the stretched tissue. FLEXION RANGE OF MOTION AND STRETCHING EXERCISES: STRETCH - Flexion, Single Knee to Chest   Lie on a firm bed or floor with both legs extended in front of you.  Keeping one leg in contact with the floor, bring your opposite knee to your chest. Hold your leg in place by either grabbing behind your thigh or at your knee.  Pull until you feel a gentle stretch in your low back. Hold __________ seconds.  Slowly release your grasp and repeat the exercise with the opposite side. Repeat __________ times. Complete this exercise __________ times per day.  STRETCH - Flexion, Double Knee to Chest  Lie on a firm bed or floor with both legs extended in front of you.  Keeping one leg in contact with the floor, bring your opposite knee to your chest.  Tense your stomach muscles to support your back and then lift your other knee to your chest. Hold your legs in place by either grabbing behind your thighs or at your knees.  Pull both knees toward your chest until you feel a gentle stretch in your low back. Hold __________ seconds.  Tense your stomach muscles and slowly return one leg at a time to the floor. Repeat __________ times. Complete this exercise __________ times per day.  STRETCH - Low Trunk Rotation   Lie on a firm bed or floor. Keeping your legs in front of you, bend your knees so they are  both pointed toward the ceiling and your feet are flat on the floor.  Extend your arms out to the side. This will stabilize your upper body by keeping your shoulders in contact with the floor.  Gently and slowly drop both knees together to one side until you feel a gentle stretch in your low back. Hold for __________ seconds.  Tense your stomach muscles to support your low back as you bring your knees back to the starting position. Repeat the  exercise to the other side. Repeat __________ times. Complete this exercise __________ times per day  EXTENSION RANGE OF MOTION AND FLEXIBILITY EXERCISES: STRETCH - Extension, Prone on Elbows  Lie on your stomach on the floor, a bed will be too soft. Place your palms about shoulder width apart and at the height of your head.  Place your elbows under your shoulders. If this is too painful, stack pillows under your chest.  Allow your body to relax so that your hips drop lower and make contact more completely with the floor.  Hold this position for __________ seconds.  Slowly return to lying flat on the floor. Repeat __________ times. Complete this exercise __________ times per day.  RANGE OF MOTION - Extension, Prone Press Ups  Lie on your stomach on the floor, a bed will be too soft. Place your palms about shoulder width apart and at the height of your head.  Keeping your back as relaxed as possible, slowly straighten your elbows while keeping your hips on the floor. You may adjust the placement of your hands to maximize your comfort. As you gain motion, your hands will come more underneath your shoulders.  Hold this position __________ seconds.  Slowly return to lying flat on the floor. Repeat __________ times. Complete this exercise __________ times per day.  STRENGTHENING EXERCISES - Sciatica  These exercises may help you when beginning to rehabilitate your injury. These exercises should be done near your "sweet spot." This is the neutral, low-back arch, somewhere between fully rounded and fully arched, that is your least painful position. When performed in this safe range of motion, these exercises can be used for people who have either a flexion or extension based injury. These exercises may resolve your symptoms with or without further involvement from your physician, physical therapist or athletic trainer. While completing these exercises, remember:   Muscles can gain both the  endurance and the strength needed for everyday activities through controlled exercises.  Complete these exercises as instructed by your physician, physical therapist or athletic trainer. Progress with the resistance and repetition exercises only as your caregiver advises.  You may experience muscle soreness or fatigue, but the pain or discomfort you are trying to eliminate should never worsen during these exercises. If this pain does worsen, stop and make certain you are following the directions exactly. If the pain is still present after adjustments, discontinue the exercise until you can discuss the trouble with your clinician. STRENGTHENING - Deep Abdominals, Pelvic Tilt   Lie on a firm bed or floor. Keeping your legs in front of you, bend your knees so they are both pointed toward the ceiling and your feet are flat on the floor.  Tense your lower abdominal muscles to press your low back into the floor. This motion will rotate your pelvis so that your tail bone is scooping upwards rather than pointing at your feet or into the floor.  With a gentle tension and even breathing, hold this position for __________ seconds. Repeat  __________ times. Complete this exercise __________ times per day.  STRENGTHENING - Abdominals, Crunches   Lie on a firm bed or floor. Keeping your legs in front of you, bend your knees so they are both pointed toward the ceiling and your feet are flat on the floor. Cross your arms over your chest.  Slightly tip your chin down without bending your neck.  Tense your abdominals and slowly lift your trunk high enough to just clear your shoulder blades. Lifting higher can put excessive stress on the low back and does not further strengthen your abdominal muscles.  Control your return to the starting position. Repeat __________ times. Complete this exercise __________ times per day.  STRENGTHENING - Quadruped, Opposite UE/LE Lift  Assume a hands and knees position on a firm  surface. Keep your hands under your shoulders and your knees under your hips. You may place padding under your knees for comfort.  Find your neutral spine and gently tense your abdominal muscles so that you can maintain this position. Your shoulders and hips should form a rectangle that is parallel with the floor and is not twisted.  Keeping your trunk steady, lift your right hand no higher than your shoulder and then your left leg no higher than your hip. Make sure you are not holding your breath. Hold this position __________ seconds.  Continuing to keep your abdominal muscles tense and your back steady, slowly return to your starting position. Repeat with the opposite arm and leg. Repeat __________ times. Complete this exercise __________ times per day.  STRENGTHENING - Abdominals and Quadriceps, Straight Leg Raise   Lie on a firm bed or floor with both legs extended in front of you.  Keeping one leg in contact with the floor, bend the other knee so that your foot can rest flat on the floor.  Find your neutral spine, and tense your abdominal muscles to maintain your spinal position throughout the exercise.  Slowly lift your straight leg off the floor about 6 inches for a count of 15, making sure to not hold your breath.  Still keeping your neutral spine, slowly lower your leg all the way to the floor. Repeat this exercise with each leg __________ times. Complete this exercise __________ times per day. POSTURE AND BODY MECHANICS CONSIDERATIONS - Sciatica Keeping correct posture when sitting, standing or completing your activities will reduce the stress put on different body tissues, allowing injured tissues a chance to heal and limiting painful experiences. The following are general guidelines for improved posture. Your physician or physical therapist will provide you with any instructions specific to your needs. While reading these guidelines, remember:  The exercises prescribed by your  provider will help you have the flexibility and strength to maintain correct postures.  The correct posture provides the optimal environment for your joints to work. All of your joints have less wear and tear when properly supported by a spine with good posture. This means you will experience a healthier, less painful body.  Correct posture must be practiced with all of your activities, especially prolonged sitting and standing. Correct posture is as important when doing repetitive low-stress activities (typing) as it is when doing a single heavy-load activity (lifting). RESTING POSITIONS Consider which positions are most painful for you when choosing a resting position. If you have pain with flexion-based activities (sitting, bending, stooping, squatting), choose a position that allows you to rest in a less flexed posture. You would want to avoid curling into a fetal position  on your side. If your pain worsens with extension-based activities (prolonged standing, working overhead), avoid resting in an extended position such as sleeping on your stomach. Most people will find more comfort when they rest with their spine in a more neutral position, neither too rounded nor too arched. Lying on a non-sagging bed on your side with a pillow between your knees, or on your back with a pillow under your knees will often provide some relief. Keep in mind, being in any one position for a prolonged period of time, no matter how correct your posture, can still lead to stiffness. PROPER SITTING POSTURE In order to minimize stress and discomfort on your spine, you must sit with correct posture Sitting with good posture should be effortless for a healthy body. Returning to good posture is a gradual process. Many people can work toward this most comfortably by using various supports until they have the flexibility and strength to maintain this posture on their own. When sitting with proper posture, your ears will fall over  your shoulders and your shoulders will fall over your hips. You should use the back of the chair to support your upper back. Your low back will be in a neutral position, just slightly arched. You may place a small pillow or folded towel at the base of your low back for support.  When working at a desk, create an environment that supports good, upright posture. Without extra support, muscles fatigue and lead to excessive strain on joints and other tissues. Keep these recommendations in mind: CHAIR:   A chair should be able to slide under your desk when your back makes contact with the back of the chair. This allows you to work closely.  The chair's height should allow your eyes to be level with the upper part of your monitor and your hands to be slightly lower than your elbows. BODY POSITION  Your feet should make contact with the floor. If this is not possible, use a foot rest.  Keep your ears over your shoulders. This will reduce stress on your neck and low back. INCORRECT SITTING POSTURES   If you are feeling tired and unable to assume a healthy sitting posture, do not slouch or slump. This puts excessive strain on your back tissues, causing more damage and pain. Healthier options include:  Using more support, like a lumbar pillow.  Switching tasks to something that requires you to be upright or walking.  Talking a brief walk.  Lying down to rest in a neutral-spine position. PROLONGED STANDING WHILE SLIGHTLY LEANING FORWARD  When completing a task that requires you to lean forward while standing in one place for a long time, place either foot up on a stationary 2-4 inch high object to help maintain the best posture. When both feet are on the ground, the low back tends to lose its slight inward curve. If this curve flattens (or becomes too large), then the back and your other joints will experience too much stress, fatigue more quickly and can cause pain.  CORRECT STANDING POSTURES Proper  standing posture should be assumed with all daily activities, even if they only take a few moments, like when brushing your teeth. As in sitting, your ears should fall over your shoulders and your shoulders should fall over your hips. You should keep a slight tension in your abdominal muscles to brace your spine. Your tailbone should point down to the ground, not behind your body, resulting in an over-extended swayback posture.  INCORRECT STANDING POSTURES  Common incorrect standing postures include a forward head, locked knees and/or an excessive swayback. WALKING Walk with an upright posture. Your ears, shoulders and hips should all line-up. PROLONGED ACTIVITY IN A FLEXED POSITION When completing a task that requires you to bend forward at your waist or lean over a low surface, try to find a way to stabilize 3 of 4 of your limbs. You can place a hand or elbow on your thigh or rest a knee on the surface you are reaching across. This will provide you more stability so that your muscles do not fatigue as quickly. By keeping your knees relaxed, or slightly bent, you will also reduce stress across your low back. CORRECT LIFTING TECHNIQUES DO :   Assume a wide stance. This will provide you more stability and the opportunity to get as close as possible to the object which you are lifting.  Tense your abdominals to brace your spine; then bend at the knees and hips. Keeping your back locked in a neutral-spine position, lift using your leg muscles. Lift with your legs, keeping your back straight.  Test the weight of unknown objects before attempting to lift them.  Try to keep your elbows locked down at your sides in order get the best strength from your shoulders when carrying an object.  Always ask for help when lifting heavy or awkward objects. INCORRECT LIFTING TECHNIQUES DO NOT:   Lock your knees when lifting, even if it is a small object.  Bend and twist. Pivot at your feet or move your feet  when needing to change directions.  Assume that you cannot safely pick up a paperclip without proper posture. Document Released: 03/01/2005 Document Revised: 07/16/2013 Document Reviewed: 06/13/2008 [facility] Patient Information 2015 [place], [place]. This information is not intended to replace advice given to you by your health care provider. Make sure you discuss any questions you have with your health care provider.

## 2014-12-04 NOTE — Progress Notes (Signed)
   Subjective:    I'm seeing this patient as a consultation for:  Dr. Madilyn Fireman  CC: Back pain  HPI: Patient has a 2 day history of significant left low back pain. She has the pain radiates to her plantar foot. She denies any weakness or numbness or bowel bladder dysfunction or difficulty walking. 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 Her granddaughter is able to walk and requires the patient to pick the child up. She denies any injury but thinks she may have strained her back 2 days ago picking her grandchild up. She does have a complicating medical history for fibromyalgia but notes that her fibromyalgia seems to be pretty well controlled. Additionally the patient notes that she has any implanted bladder stimulator. The battery pack is located in her left low back subcutaneous tissue. This is not MRI compatible. She has medications including gabapentin Flexeril and tramadol which he uses to control her fibromyalgia. She has tried some ibuprofen for her back pain which helped a little.  Past medical history, Surgical history, Family history not pertinant except as noted below, Social history, Allergies, and medications have been entered into the medical record, reviewed, and no changes needed.   Review of Systems: No headache, visual changes, nausea, vomiting, diarrhea, constipation, dizziness, abdominal pain, skin rash, fevers, chills, night sweats, weight loss, swollen lymph nodes, body aches, joint swelling, muscle aches, chest pain, shortness of breath, mood changes, visual or auditory hallucinations.   Objective:    Filed Vitals:   12/04/14 1110  BP: 126/72  Pulse: 96   General: Well Developed, well nourished, and in no acute distress.  Neuro/Psych: Alert and oriented x3, extra-ocular muscles intact, able to move all 4 extremities, sensation grossly intact. Skin: Warm and dry, no rashes noted.  Respiratory: Not using accessory  muscles, speaking in full sentences, trachea midline.  Cardiovascular: Pulses palpable, no extremity edema. Abdomen: Does not appear distended. MSK: Back nontender to midline. Tender palpation left SI joint. Lumbar range of motion is normal throughout. Lower extremity reflexes and strength are equal and normal bilaterally. Normal gait. Negative straight leg raise test bilaterally. Positive left-sided Corky Sox test. Sensation is intact throughout. Skin: Palpable mass mobile consistent with implanted device left lumbar subcutaneous tissue  No results found for this or any previous visit (from the past 24 hour(s)). No results found.  Impression and Recommendations:   This case required medical decision making of moderate complexity.

## 2014-12-04 NOTE — Telephone Encounter (Signed)
Pt called c/o pain in L hip and buttock for several days. Scheduled appt w/Dr. Georgina Snell.  Also pt stated that her granddaughter was approved for Cap-C and she will need a letter from Dr. Madilyn Fireman stating that she cannot lift >25lbs this will provide her with a caregiver that will help her with her.Robin Conrad [place]

## 2014-12-04 NOTE — Assessment & Plan Note (Signed)
Patient's symptoms are most consistent with a lumbosacral strain. She has some sciatica symptoms. Patient has trouble affording physical therapy. Plan for continuation of her home medications of Flexeril tramadol and ibuprofen. Recommend some home exercises to try and watchful waiting. We'll obtain a x-ray of her lumbar spine. Return in a few weeks. If not better will consider diagnostic/therapeutic SI joint injection.

## 2014-12-05 ENCOUNTER — Ambulatory Visit (INDEPENDENT_AMBULATORY_CARE_PROVIDER_SITE_OTHER): Payer: PRIVATE HEALTH INSURANCE | Admitting: Licensed Clinical Social Worker

## 2014-12-05 DIAGNOSIS — F3341 Major depressive disorder, recurrent, in partial remission: Secondary | ICD-10-CM | POA: Diagnosis not present

## 2014-12-05 DIAGNOSIS — F419 Anxiety disorder, unspecified: Secondary | ICD-10-CM | POA: Diagnosis not present

## 2014-12-09 ENCOUNTER — Encounter: Payer: Self-pay | Admitting: Family Medicine

## 2014-12-09 MED ORDER — CYCLOBENZAPRINE HCL 10 MG PO TABS: 10.0000 mg | ORAL_TABLET | Freq: Every day | ORAL | Status: DC

## 2014-12-13 ENCOUNTER — Encounter: Payer: Self-pay | Admitting: Family Medicine

## 2014-12-16 MED ORDER — CYCLOBENZAPRINE HCL 10 MG PO TABS: 10.0000 mg | ORAL_TABLET | Freq: Two times a day (BID) | ORAL | Status: DC | PRN

## 2014-12-19 ENCOUNTER — Other Ambulatory Visit: Payer: Self-pay | Admitting: *Deleted

## 2014-12-19 MED ORDER — LEVOTHYROXINE SODIUM 50 MCG PO TABS: 50.0000 ug | ORAL_TABLET | Freq: Every day | ORAL | Status: DC

## 2014-12-24 ENCOUNTER — Encounter: Payer: Self-pay | Admitting: Family Medicine

## 2014-12-24 ENCOUNTER — Ambulatory Visit (INDEPENDENT_AMBULATORY_CARE_PROVIDER_SITE_OTHER): Payer: PRIVATE HEALTH INSURANCE | Admitting: Family Medicine

## 2014-12-24 VITALS — BP 132/68 | HR 87 | Wt 128.0 lb

## 2014-12-24 DIAGNOSIS — M5442 Lumbago with sciatica, left side: Secondary | ICD-10-CM | POA: Diagnosis not present

## 2014-12-24 NOTE — Assessment & Plan Note (Signed)
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 I believe the left SI joint is one of her pain generators that she had improvement in symptoms immediately following injection. Additionally I believe she has sciatica symptoms. Plan for 2 more weeks of relative rest and watchful waiting with continued current regimen. If no better in 2 weeks would strongly consider obtaining a CT myelogram to plan for epidural steroid injection.

## 2014-12-24 NOTE — Patient Instructions (Signed)
Thank you for coming in today. Call or go to the ER if you develop a large red swollen joint with extreme pain or oozing puss.  Return in 2 weeks.
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
## 2014-12-26 ENCOUNTER — Ambulatory Visit (INDEPENDENT_AMBULATORY_CARE_PROVIDER_SITE_OTHER): Payer: PRIVATE HEALTH INSURANCE | Admitting: Licensed Clinical Social Worker

## 2014-12-26 DIAGNOSIS — F419 Anxiety disorder, unspecified: Secondary | ICD-10-CM | POA: Diagnosis not present

## 2014-12-26 DIAGNOSIS — F3341 Major depressive disorder, recurrent, in partial remission: Secondary | ICD-10-CM

## 2014-12-31 ENCOUNTER — Other Ambulatory Visit: Payer: Self-pay | Admitting: Family Medicine

## 2015-01-01 MED ORDER — ALPRAZOLAM 0.25 MG PO TABS: 0.2500 mg | ORAL_TABLET | Freq: Three times a day (TID) | ORAL | Status: DC

## 2015-01-01 NOTE — Telephone Encounter (Signed)
rx printed.Stacey Maura Lynetta  

## 2015-01-07 ENCOUNTER — Ambulatory Visit: Payer: Self-pay | Admitting: Family Medicine

## 2015-01-08 ENCOUNTER — Ambulatory Visit (INDEPENDENT_AMBULATORY_CARE_PROVIDER_SITE_OTHER): Payer: PRIVATE HEALTH INSURANCE | Admitting: Family Medicine

## 2015-01-08 ENCOUNTER — Encounter: Payer: Self-pay | Admitting: Family Medicine

## 2015-01-08 VITALS — BP 128/49 | HR 87 | Wt 123.0 lb

## 2015-01-08 DIAGNOSIS — IMO0002 Reserved for concepts with insufficient information to code with codable children: Secondary | ICD-10-CM | POA: Insufficient documentation

## 2015-01-08 DIAGNOSIS — L03011 Cellulitis of right finger: Secondary | ICD-10-CM

## 2015-01-08 DIAGNOSIS — M5442 Lumbago with sciatica, left side: Secondary | ICD-10-CM | POA: Diagnosis not present

## 2015-01-08 MED ORDER — DOXYCYCLINE HYCLATE 100 MG PO TABS
100.0000 mg | ORAL_TABLET | Freq: Two times a day (BID) | ORAL | Status: DC
Start: 2015-01-08 — End: 2015-02-20

## 2015-01-08 MED ORDER — GABAPENTIN 600 MG PO TABS: ORAL_TABLET | ORAL | Status: DC
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
## 2015-01-08 NOTE — Assessment & Plan Note (Signed)
Back pain itself is doing quite well following the injection. We'll continue with watchful waiting. The sciatica is also improving. Increase gabapentin to 600 mg 3 times daily. Recheck in a few months.

## 2015-01-08 NOTE — Assessment & Plan Note (Signed)
Treatment with doxycycline. Watchful waiting return as needed.

## 2015-01-08 NOTE — Patient Instructions (Signed)
Thank you for coming in today. Start doxycycline for paronychia Take gabapentin increased dose to 600 mg tablets Return in a month or 2 as needed for back pain.  Come back or go to the emergency room if you notice new weakness new numbness problems walking or bowel or bladder problems.  Paronychia Paronychia is an infection of the skin that surrounds a nail. It usually affects the skin around a fingernail, but it may also occur near a toenail. It often causes pain and swelling around the nail. This condition may come on suddenly or develop over a longer period. In some cases, a collection of pus (abscess) can form near or under the nail. Usually, paronychia is not serious and it clears up with treatment. CAUSES This condition may be caused by bacteria or fungi. It is commonly caused by either Streptococcus or Staphylococcus bacteria. The bacteria or fungi often cause the infection by getting into the affected area through an opening in the skin, such as a cut or a hangnail. RISK FACTORS This condition is more likely to develop in:  People who get their hands wet often, such as those who work as Designer, industrial/product, bartenders, or nurses.  People who bite their fingernails or suck their thumbs.  People who trim their nails too short.  People who have hangnails or injured fingertips.  People who get manicures.  People who have diabetes. SYMPTOMS Symptoms of this condition include:  Redness and swelling of the skin near the nail.  Tenderness around the nail when you touch the area.  Pus-filled bumps under the cuticle. The cuticle is the skin at the base or sides of the nail.  Fluid or pus under the nail.  Throbbing pain in the area. DIAGNOSIS This condition is usually diagnosed with a physical exam. In some cases, a sample of pus may be taken from an abscess to be tested in a lab. This can help to determine what type of bacteria or fungi is causing the condition. TREATMENT Treatment for  this condition depends on the cause and severity of the condition. If the condition is mild, it may clear up on its own in a few days. Your health care provider may recommend soaking the affected area in warm water a few times a day. When treatment is needed, the options may include:  Antibiotic medicine, if the condition is caused by a bacterial infection.  Antifungal medicine, if the condition is caused by a fungal infection.  Incision and drainage, if an abscess is present. In this procedure, the health care provider will cut open the abscess so the pus can drain out. HOME CARE INSTRUCTIONS  Soak the affected area in warm water if directed to do so by your health care provider. You may be told to do this for 20 minutes, 2-3 times a day. Keep the area dry in between soakings.  Take medicines only as directed by your health care provider.  If you were prescribed an antibiotic medicine, finish all of it even if you start to feel better.  Keep the affected area clean.  Do not try to drain a fluid-filled bump yourself.  If you will be washing dishes or performing other tasks that require your hands to get wet, wear rubber gloves. You should also wear gloves if your hands might come in contact with irritating substances, such as cleaners or chemicals.  Follow your health care provider's instructions about:  Wound care.  Bandage (dressing) changes and removal. SEEK MEDICAL CARE IF:  Your symptoms get worse or do not improve with treatment.  You have a fever or chills.  You have redness spreading from the affected area.  You have continued or increased fluid, blood, or pus coming from the affected area.  Your finger or knuckle becomes swollen or is difficult to move.   This information is not intended to replace advice given to you by your health care provider. Make sure you discuss any questions you have with your health care provider.   Document Released: 08/25/2000 Document  Revised: 07/16/2014 Document Reviewed: 02/06/2014 Elsevier Interactive Patient Education [employer].

## 2015-01-16 ENCOUNTER — Ambulatory Visit (INDEPENDENT_AMBULATORY_CARE_PROVIDER_SITE_OTHER): Payer: PRIVATE HEALTH INSURANCE | Admitting: Licensed Clinical Social Worker

## 2015-01-16 DIAGNOSIS — F3341 Major depressive disorder, recurrent, in partial remission: Secondary | ICD-10-CM | POA: Diagnosis not present

## 2015-01-16 DIAGNOSIS — F419 Anxiety disorder, unspecified: Secondary | ICD-10-CM | POA: Diagnosis not present

## 2015-02-04 ENCOUNTER — Ambulatory Visit (INDEPENDENT_AMBULATORY_CARE_PROVIDER_SITE_OTHER): Payer: PRIVATE HEALTH INSURANCE | Admitting: Licensed Clinical Social Worker

## 2015-02-04 DIAGNOSIS — F3341 Major depressive disorder, recurrent, in partial remission: Secondary | ICD-10-CM

## 2015-02-04 DIAGNOSIS — F419 Anxiety disorder, unspecified: Secondary | ICD-10-CM

## 2015-02-05 ENCOUNTER — Other Ambulatory Visit: Payer: Self-pay

## 2015-02-05 MED ORDER — ALPRAZOLAM 0.25 MG PO TABS: 0.2500 mg | ORAL_TABLET | Freq: Three times a day (TID) | ORAL | Status: DC

## 2015-02-10 ENCOUNTER — Other Ambulatory Visit: Payer: Self-pay | Admitting: Family Medicine

## 2015-02-10 NOTE — Telephone Encounter (Signed)
Please advise. Seeing Dr. Georgina Snell for back and leg pains

## 2015-02-11 ENCOUNTER — Other Ambulatory Visit: Payer: Self-pay | Admitting: Family Medicine

## 2015-02-11 MED ORDER — ALPRAZOLAM 0.25 MG PO TABS: 0.2500 mg | ORAL_TABLET | Freq: Three times a day (TID) | ORAL | Status: DC

## 2015-02-11 NOTE — Addendum Note (Signed)
Addended by: Doree Albee on: 02/11/2015 02:31 PM   Modules accepted: Orders

## 2015-02-19 ENCOUNTER — Other Ambulatory Visit: Payer: Self-pay

## 2015-02-19 MED ORDER — ZOLPIDEM TARTRATE 5 MG PO TABS: ORAL_TABLET | ORAL | Status: DC

## 2015-02-20 ENCOUNTER — Ambulatory Visit (INDEPENDENT_AMBULATORY_CARE_PROVIDER_SITE_OTHER): Payer: PRIVATE HEALTH INSURANCE | Admitting: Family Medicine

## 2015-02-20 ENCOUNTER — Encounter: Payer: Self-pay | Admitting: Family Medicine

## 2015-02-20 VITALS — BP 131/58 | HR 100 | Ht 66.0 in | Wt 124.0 lb

## 2015-02-20 DIAGNOSIS — G4700 Insomnia, unspecified: Secondary | ICD-10-CM

## 2015-02-20 DIAGNOSIS — M797 Fibromyalgia: Secondary | ICD-10-CM | POA: Diagnosis not present

## 2015-02-20 DIAGNOSIS — Z114 Encounter for screening for human immunodeficiency virus [HIV]: Secondary | ICD-10-CM

## 2015-02-20 DIAGNOSIS — L03011 Cellulitis of right finger: Secondary | ICD-10-CM

## 2015-02-20 DIAGNOSIS — F411 Generalized anxiety disorder: Secondary | ICD-10-CM

## 2015-02-20 DIAGNOSIS — Z13220 Encounter for screening for lipoid disorders: Secondary | ICD-10-CM

## 2015-02-20 DIAGNOSIS — E039 Hypothyroidism, unspecified: Secondary | ICD-10-CM

## 2015-02-20 DIAGNOSIS — Z1159 Encounter for screening for other viral diseases: Secondary | ICD-10-CM

## 2015-02-20 MED ORDER — BUPROPION HCL ER (XL) 150 MG PO TB24: ORAL_TABLET | ORAL | Status: DC

## 2015-02-20 MED ORDER — ALPRAZOLAM 0.25 MG PO TABS: 0.2500 mg | ORAL_TABLET | Freq: Three times a day (TID) | ORAL | Status: DC | PRN

## 2015-02-20 MED ORDER — TRIAMCINOLONE ACETONIDE 0.5 % EX OINT: 1.0000 "application " | TOPICAL_OINTMENT | Freq: Two times a day (BID) | CUTANEOUS | Status: DC

## 2015-02-20 NOTE — Progress Notes (Signed)
   Subjective:    Patient ID: Robin Conrad, female    DOB: 1950-04-18, 64 y.o.   MRN: [id number]  HPI Robin Conrad - she ran out of Ambien last night. Called for a refill On Monday but it was not done until Wednesday.  She would like to make sure that a refill has been sent to the pharmacy. She has chronic insomnia and without the medication her sleep is quite disruptive.  Fibromyalgia - overall she is doing okay. No major flares recently.  GAD- doing fair. Unfortunately her father had a sudden heart attack and passed away in fact his general service was yesterday says she's a little bit overwhelmed and stressed right now. Her daughter flew in from [place] and is staying with them as well. She was also little stressed this morning because the CNA that works with her granddaughter who has special needs was 45 minutes late and she was afraid she was going to be late for her appointment today. She does complain of feeling down several days of the week but denies feeling hopeless. She also denies thoughts of wine harm herself. She is still having some sleep issues with difficulty falling asleep and staying asleep. She also complete feeling nervous and on edge more than half the days and difficulty controlling her worry over the last couple of weeks.  She was seen recently for paronychia on her first finger. She took a course of antibiotic since says it's much better than it was still a little bit erythematous. At the time it was felt that it wasn't amenable to incision and drainage. Unfortunately she continues to get recurrent paronychia on that finger. She says it gets dry and then cracks.  Review of Systems     Objective:   Physical Exam  Constitutional: She is oriented to person, place, and time. She appears well-developed and well-nourished.  HENT:  Head: Normocephalic and atraumatic.  Cardiovascular: Normal rate, regular rhythm and normal heart sounds.   Pulmonary/Chest: Effort normal and breath  sounds normal.  Neurological: She is alert and oriented to person, place, and time.  Skin: Skin is warm and dry.  Psychiatric: She has a normal mood and affect. Her behavior is normal.          Assessment & Plan:  Insomnia -  Looks liek ambien refilled yesterday but pharmacy not received.    Due for Hep C screening and HIV screening.  Discussed new guidelines and recommendations.  Depression/Anxiety - PHQ=9 score of 13, and GAD-7 score of 8. Continue with Lexapro and Wellbutrin. Continue with regular therapy with [name]. I think some of the increase in her symptoms was from the recent death of her father. Thus there is also a component of grieving.   fibromyalgia-stable. No recent major flares. Continue with current regimen.   Sees [name] for her pap smear. Last one was 2 years. Using estrace regularly.   Colon cancer screening. Discussed need for colonoscopy vs Cologuard.   Paronychia I would recommend a trial of topical steroid to help keep the skin from cracking which will hopefully reduce the potential for infection and recurrent symptoms. She does seem to respond well to antibiotic switch he takes them. Certainly if she feels like it's getting worse over the next week or 2 then please let us know and we cannot: A second round of antibiotic set needed.

## 2015-02-26 ENCOUNTER — Ambulatory Visit (INDEPENDENT_AMBULATORY_CARE_PROVIDER_SITE_OTHER): Payer: PRIVATE HEALTH INSURANCE | Admitting: Licensed Clinical Social Worker

## 2015-02-26 DIAGNOSIS — F3341 Major depressive disorder, recurrent, in partial remission: Secondary | ICD-10-CM

## 2015-02-26 DIAGNOSIS — F419 Anxiety disorder, unspecified: Secondary | ICD-10-CM

## 2015-03-03 ENCOUNTER — Encounter: Payer: Self-pay | Admitting: Family Medicine

## 2015-03-06 ENCOUNTER — Telehealth: Payer: Self-pay | Admitting: Family Medicine

## 2015-03-06 MED ORDER — PROMETHAZINE HCL 25 MG RE SUPP: 25.0000 mg | Freq: Four times a day (QID) | RECTAL | Status: DC | PRN

## 2015-03-06 NOTE — Telephone Encounter (Signed)
Pt's husband, Al, called to see if an Rx for Phenergan suppositories could be sent to pharmacy? Husband states the Pt has gotten the "stomach bug" and has been throwing up all day. Pt was around her two grandchildren while they were sick and that is where she is believed to have caught it. Will route to a Provider in office for review.

## 2015-03-06 NOTE — Telephone Encounter (Signed)
I called the house again at 6pm. I was able to get in touch with Mr Kolodziejski. She will be able to get the phenergan rx.  She will f/u in clinic tomorrow if not better.

## 2015-03-06 NOTE — Telephone Encounter (Signed)
I called and left a message with the patient stating that I would send in the phenergan to the pharmacy.

## 2015-03-11 ENCOUNTER — Encounter: Payer: Self-pay | Admitting: Family Medicine

## 2015-03-11 ENCOUNTER — Ambulatory Visit (INDEPENDENT_AMBULATORY_CARE_PROVIDER_SITE_OTHER): Payer: PRIVATE HEALTH INSURANCE | Admitting: Family Medicine

## 2015-03-11 VITALS — BP 123/63 | HR 102 | Wt 124.0 lb

## 2015-03-11 DIAGNOSIS — B351 Tinea unguium: Secondary | ICD-10-CM

## 2015-03-11 DIAGNOSIS — L03011 Cellulitis of right finger: Secondary | ICD-10-CM | POA: Diagnosis not present

## 2015-03-11 DIAGNOSIS — M5442 Lumbago with sciatica, left side: Secondary | ICD-10-CM

## 2015-03-11 DIAGNOSIS — M2021 Hallux rigidus, right foot: Secondary | ICD-10-CM | POA: Insufficient documentation

## 2015-03-11 DIAGNOSIS — M2022 Hallux rigidus, left foot: Secondary | ICD-10-CM

## 2015-03-11 DIAGNOSIS — M792 Neuralgia and neuritis, unspecified: Secondary | ICD-10-CM | POA: Diagnosis not present

## 2015-03-11 MED ORDER — CEFUROXIME AXETIL 500 MG PO TABS: 500.0000 mg | ORAL_TABLET | Freq: Two times a day (BID) | ORAL | Status: DC

## 2015-03-11 MED ORDER — TERBINAFINE HCL 250 MG PO TABS: 250.0000 mg | ORAL_TABLET | Freq: Every day | ORAL | Status: DC

## 2015-03-11 NOTE — Assessment & Plan Note (Signed)
Recurrent. Start Ceftin. Return in a few days if not better
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
## 2015-03-11 NOTE — Assessment & Plan Note (Signed)
Doing well. Will restart physical therapy as needed.

## 2015-03-11 NOTE — Assessment & Plan Note (Signed)
Likely onychomycosis index finger. Start oral Lamisil. Check metabolic panel. Recheck in a few weeks.

## 2015-03-11 NOTE — Patient Instructions (Signed)
Thank you for coming in today. Get labs ordered by [facility].  Take both medicines.  Return in 1 month.  Let me know if you want to do an injection to the toes.   I recommend using sneakers more.  Bring your sneakers and the orthotics in the near future.

## 2015-03-11 NOTE — Assessment & Plan Note (Signed)
Continue gabapentin.

## 2015-03-13 ENCOUNTER — Ambulatory Visit: Payer: PRIVATE HEALTH INSURANCE | Admitting: Licensed Clinical Social Worker

## 2015-03-13 LAB — COMPLETE METABOLIC PANEL WITH GFR
ALT: 25 U/L (ref 6–29)
AST: 25 U/L (ref 10–35)
Albumin: 3.8 g/dL (ref 3.6–5.1)
Alkaline Phosphatase: 92 U/L (ref 33–130)
BILIRUBIN TOTAL: 0.5 mg/dL (ref 0.2–1.2)
BUN: 10 mg/dL (ref 7–25)
CO2: 29 mmol/L (ref 20–31)
Calcium: 9.2 mg/dL (ref 8.6–10.4)
Chloride: 103 mmol/L (ref 98–110)
Creat: 0.71 mg/dL (ref 0.50–0.99)
Glucose, Bld: 83 mg/dL (ref 65–99)
Potassium: 3.9 mmol/L (ref 3.5–5.3)
Sodium: 142 mmol/L (ref 135–146)
TOTAL PROTEIN: 5.9 g/dL — AB (ref 6.1–8.1)

## 2015-03-13 LAB — TSH: TSH: 1.27 u[IU]/mL (ref 0.350–4.500)

## 2015-03-13 LAB — LIPID PANEL
CHOL/HDL RATIO: 2.1 ratio (ref <=5.0)
CHOLESTEROL: 111 mg/dL — AB (ref 125–200)
HDL: 52 mg/dL (ref >=46)
LDL CALC: 42 mg/dL (ref <130)
TRIGLYCERIDES: 84 mg/dL (ref <150)
VLDL: 17 mg/dL (ref <30)

## 2015-03-13 LAB — HEPATITIS C ANTIBODY: HCV AB: NEGATIVE

## 2015-03-13 LAB — HIV ANTIBODY (ROUTINE TESTING W REFLEX): HIV 1&2 Ab, 4th Generation: NONREACTIVE

## 2015-03-20 ENCOUNTER — Ambulatory Visit (INDEPENDENT_AMBULATORY_CARE_PROVIDER_SITE_OTHER): Payer: PRIVATE HEALTH INSURANCE | Admitting: Adult Health

## 2015-03-20 ENCOUNTER — Ambulatory Visit (INDEPENDENT_AMBULATORY_CARE_PROVIDER_SITE_OTHER): Payer: PRIVATE HEALTH INSURANCE | Admitting: Licensed Clinical Social Worker

## 2015-03-20 ENCOUNTER — Encounter: Payer: Self-pay | Admitting: Adult Health

## 2015-03-20 VITALS — BP 113/73 | HR 101 | Temp 97.9°F | Ht 66.0 in | Wt 126.0 lb

## 2015-03-20 DIAGNOSIS — F3341 Major depressive disorder, recurrent, in partial remission: Secondary | ICD-10-CM | POA: Diagnosis not present

## 2015-03-20 DIAGNOSIS — F419 Anxiety disorder, unspecified: Secondary | ICD-10-CM

## 2015-03-20 DIAGNOSIS — J4540 Moderate persistent asthma, uncomplicated: Secondary | ICD-10-CM | POA: Diagnosis not present

## 2015-03-20 DIAGNOSIS — R05 Cough: Secondary | ICD-10-CM

## 2015-03-20 DIAGNOSIS — R059 Cough, unspecified: Secondary | ICD-10-CM
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
## 2015-03-20 NOTE — Patient Instructions (Signed)
Continue on current regimen  Follow up with Dr. Elsworth Soho  In 1 year and As needed

## 2015-03-20 NOTE — Assessment & Plan Note (Signed)
Asthma well controlled , no flare off Symbicort  Continue to control for triggers with post nasal drip and GERD   Plan   Cont on current regimen  follow up 1 year

## 2015-03-23 NOTE — Progress Notes (Signed)
Reviewed & agree with plan  

## 2015-03-24 ENCOUNTER — Other Ambulatory Visit: Payer: Self-pay | Admitting: Physician Assistant

## 2015-03-24 MED FILL — ZOLPIDEM TARTRATE 5 MG TAB: 5 | 30 days supply | Qty: 30 | Fill #1

## 2015-03-24 MED FILL — BUPROPION HCL XL 150 MG TAB: 150 | 30 days supply | Qty: 90 | Fill #1

## 2015-03-24 MED FILL — VENTOLIN HFA 90 MCG INHALER: 108 (90 BAS | 30 days supply | Qty: 18 | Fill #0

## 2015-03-24 MED FILL — LEVOTHYROXINE 50 MCG TABLET: 50 | 30 days supply | Qty: 30 | Fill #3

## 2015-03-31 MED FILL — ALPRAZolam 0.25 MG TABS: 0.25 | 30 days supply | Qty: 90 | Fill #0

## 2015-03-31 MED FILL — LANSOPRAZOLE DR 30 MG CAP: 30 | 30 days supply | Qty: 60 | Fill #5

## 2015-04-03 ENCOUNTER — Ambulatory Visit: Payer: PRIVATE HEALTH INSURANCE | Admitting: Licensed Clinical Social Worker

## 2015-04-08 ENCOUNTER — Telehealth: Payer: Self-pay

## 2015-04-08 NOTE — Telephone Encounter (Signed)
Patient scheduled.

## 2015-04-08 NOTE — Telephone Encounter (Signed)
Normal and she has chronic constipation so the diarrhea I think is new I don't remember as discussing it previously. She may need further workup with stool cultures etc. Okay to offer her an appointment. In the short-term she can take 2 Imodium at one time if needed.

## 2015-04-08 NOTE — Telephone Encounter (Signed)
She reports chronic diarrhea. If she takes Imodium with little relief. Denies fever, chills or sweats. What can she take for the diarrhea?

## 2015-04-09 ENCOUNTER — Encounter: Payer: Self-pay | Admitting: Family Medicine

## 2015-04-09 ENCOUNTER — Other Ambulatory Visit: Payer: Self-pay | Admitting: Family Medicine

## 2015-04-09 ENCOUNTER — Ambulatory Visit (INDEPENDENT_AMBULATORY_CARE_PROVIDER_SITE_OTHER): Payer: PRIVATE HEALTH INSURANCE | Admitting: Family Medicine

## 2015-04-09 VITALS — BP 131/49 | HR 100 | Temp 97.9°F | Wt 122.0 lb

## 2015-04-09 DIAGNOSIS — R197 Diarrhea, unspecified: Secondary | ICD-10-CM | POA: Diagnosis not present

## 2015-04-09 LAB — CBC WITH DIFFERENTIAL/PLATELET
BASOS ABS: 0 10*3/uL (ref 0.0–0.1)
BASOS PCT: 1 % (ref 0–1)
EOS ABS: 0.1 10*3/uL (ref 0.0–0.7)
Eosinophils Relative: 2 % (ref 0–5)
HCT: 39.1 % (ref 36.0–46.0)
HEMOGLOBIN: 13 g/dL (ref 12.0–15.0)
Lymphocytes Relative: 40 % (ref 12–46)
Lymphs Abs: 1.8 10*3/uL (ref 0.7–4.0)
MCH: 30.6 pg (ref 26.0–34.0)
MCHC: 33.2 g/dL (ref 30.0–36.0)
MCV: 92 fL (ref 78.0–100.0)
MPV: 9.9 fL (ref 8.6–12.4)
Monocytes Absolute: 0.3 10*3/uL (ref 0.1–1.0)
Monocytes Relative: 6 % (ref 3–12)
NEUTROS ABS: 2.3 10*3/uL (ref 1.7–7.7)
NEUTROS PCT: 51 % (ref 43–77)
PLATELETS: 231 10*3/uL (ref 150–400)
RBC: 4.25 MIL/uL (ref 3.87–5.11)
RDW: 13 % (ref 11.5–15.5)
WBC: 4.6 10*3/uL (ref 4.0–10.5)

## 2015-04-09 LAB — COMPLETE METABOLIC PANEL WITH GFR
ALBUMIN: 3.6 g/dL (ref 3.6–5.1)
ALT: 19 U/L (ref 6–29)
AST: 24 U/L (ref 10–35)
Alkaline Phosphatase: 96 U/L (ref 33–130)
BILIRUBIN TOTAL: 0.5 mg/dL (ref 0.2–1.2)
BUN: 10 mg/dL (ref 7–25)
CO2: 29 mmol/L (ref 20–31)
Calcium: 9 mg/dL (ref 8.6–10.4)
Chloride: 104 mmol/L (ref 98–110)
Creat: 0.74 mg/dL (ref 0.50–0.99)
GFR, EST NON AFRICAN AMERICAN: 85 mL/min (ref >=60)
GFR, Est African American: >89 mL/min (ref >=60)
GLUCOSE: 80 mg/dL (ref 65–99)
Potassium: 4.1 mmol/L (ref 3.5–5.3)
SODIUM: 142 mmol/L (ref 135–146)
TOTAL PROTEIN: 5.7 g/dL — AB (ref 6.1–8.1)

## 2015-04-09 MED FILL — CYCLOBENZAPRINE 10 MG TAB: 10 | 30 days supply | Qty: 60 | Fill #0
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
## 2015-04-10 LAB — CLOSTRIDIUM DIFFICILE BY PCR: Toxigenic C. Difficile by PCR: NOT DETECTED

## 2015-04-11 ENCOUNTER — Ambulatory Visit (INDEPENDENT_AMBULATORY_CARE_PROVIDER_SITE_OTHER): Payer: PRIVATE HEALTH INSURANCE | Admitting: Family Medicine

## 2015-04-11 ENCOUNTER — Encounter: Payer: Self-pay | Admitting: Family Medicine

## 2015-04-11 VITALS — BP 132/61 | HR 93 | Wt 123.0 lb

## 2015-04-11 DIAGNOSIS — G8929 Other chronic pain: Secondary | ICD-10-CM | POA: Diagnosis not present

## 2015-04-11 DIAGNOSIS — M545 Low back pain, unspecified: Secondary | ICD-10-CM

## 2015-04-11 DIAGNOSIS — M189 Osteoarthritis of first carpometacarpal joint, unspecified: Secondary | ICD-10-CM | POA: Insufficient documentation

## 2015-04-11 DIAGNOSIS — M5030 Other cervical disc degeneration, unspecified cervical region: Secondary | ICD-10-CM | POA: Diagnosis not present

## 2015-04-11 DIAGNOSIS — M1812 Unilateral primary osteoarthritis of first carpometacarpal joint, left hand: Secondary | ICD-10-CM | POA: Diagnosis not present

## 2015-04-11 MED FILL — GABAPENTIN 600 MG TABLET: 600 | 30 days supply | Qty: 90 | Fill #2

## 2015-04-11 MED FILL — traMADol HCL 50 MG TABS: 50 | 10 days supply | Qty: 60 | Fill #5

## 2015-04-11 NOTE — Assessment & Plan Note (Signed)
Commend soft collar. Refer to physical therapy. Recheck in one month

## 2015-04-11 NOTE — Assessment & Plan Note (Signed)
Injection today. Plan for Tee-Pee type thumb brace. Recheck in one month
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
## 2015-04-11 NOTE — Patient Instructions (Signed)
Thank you for coming in today. Return in 2-4 weeks.  Call or go to the ER if you develop a large red swollen joint with extreme pain or oozing puss.  Use a Tee-Pee type thumb brace.

## 2015-04-11 NOTE — Assessment & Plan Note (Signed)
Refer to physical therapy. Recheck in one month.

## 2015-04-13 LAB — STOOL CULTURE

## 2015-04-17 ENCOUNTER — Ambulatory Visit (INDEPENDENT_AMBULATORY_CARE_PROVIDER_SITE_OTHER): Payer: PRIVATE HEALTH INSURANCE | Admitting: Licensed Clinical Social Worker

## 2015-04-17 DIAGNOSIS — F419 Anxiety disorder, unspecified: Secondary | ICD-10-CM | POA: Diagnosis not present

## 2015-04-17 DIAGNOSIS — F3341 Major depressive disorder, recurrent, in partial remission: Secondary | ICD-10-CM | POA: Diagnosis not present

## 2015-04-21 MED FILL — LEVOTHYROXINE 50 MCG TABLET: 50 | 30 days supply | Qty: 30 | Fill #4

## 2015-04-21 MED FILL — ZOLPIDEM TARTRATE 5 MG TAB: 5 | 30 days supply | Qty: 30 | Fill #2

## 2015-04-21 MED FILL — BUPROPION HCL XL 150 MG TAB: 150 | 30 days supply | Qty: 90 | Fill #2

## 2015-04-25 ENCOUNTER — Ambulatory Visit (INDEPENDENT_AMBULATORY_CARE_PROVIDER_SITE_OTHER): Payer: PRIVATE HEALTH INSURANCE | Admitting: Family Medicine

## 2015-04-25 ENCOUNTER — Encounter: Payer: Self-pay | Admitting: Family Medicine

## 2015-04-25 VITALS — BP 147/61 | HR 102 | Wt 122.0 lb

## 2015-04-25 DIAGNOSIS — M1812 Unilateral primary osteoarthritis of first carpometacarpal joint, left hand: Secondary | ICD-10-CM | POA: Diagnosis not present

## 2015-04-25 NOTE — Assessment & Plan Note (Signed)
Continue current regimen with bracing. Return as needed.
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
## 2015-04-25 NOTE — Patient Instructions (Signed)
Thank you for coming in today. Return as needed.  You are doing great!

## 2015-05-06 ENCOUNTER — Encounter: Payer: Self-pay | Admitting: Family Medicine

## 2015-05-06 MED ORDER — TRAMADOL HCL 50 MG PO TABS: ORAL_TABLET | ORAL | Status: DC

## 2015-05-06 MED FILL — LANSOPRAZOLE DR 30 MG CAP: 30 | 30 days supply | Qty: 60 | Fill #6

## 2015-05-06 MED FILL — traMADol HCL 50 MG TABS: 50 | 10 days supply | Qty: 60 | Fill #0

## 2015-05-08 ENCOUNTER — Ambulatory Visit (INDEPENDENT_AMBULATORY_CARE_PROVIDER_SITE_OTHER): Payer: PRIVATE HEALTH INSURANCE | Admitting: Licensed Clinical Social Worker

## 2015-05-08 DIAGNOSIS — F419 Anxiety disorder, unspecified: Secondary | ICD-10-CM | POA: Diagnosis not present

## 2015-05-08 DIAGNOSIS — F3341 Major depressive disorder, recurrent, in partial remission: Secondary | ICD-10-CM

## 2015-05-08 MED FILL — RESTASIS 0.05% EYE EMULSION: 0.05 | 30 days supply | Qty: 60 | Fill #1

## 2015-05-14 MED FILL — GABAPENTIN 600 MG TABLET: 600 | 30 days supply | Qty: 90 | Fill #3

## 2015-05-20 MED FILL — BUPROPION HCL XL 150 MG TAB: 150 | 30 days supply | Qty: 90 | Fill #3

## 2015-05-20 MED FILL — ALPRAZolam 0.25 MG TABS: 0.25 | 30 days supply | Qty: 90 | Fill #1

## 2015-05-20 MED FILL — ZOLPIDEM TARTRATE 5 MG TAB: 5 | 30 days supply | Qty: 30 | Fill #3

## 2015-05-20 MED FILL — LEVOTHYROXINE 50 MCG TABLET: 50 | 30 days supply | Qty: 30 | Fill #5

## 2015-05-21 ENCOUNTER — Encounter: Payer: Self-pay | Admitting: Family Medicine

## 2015-05-21 ENCOUNTER — Ambulatory Visit (INDEPENDENT_AMBULATORY_CARE_PROVIDER_SITE_OTHER): Payer: PRIVATE HEALTH INSURANCE | Admitting: Family Medicine

## 2015-05-21 VITALS — BP 111/51 | HR 90 | Wt 125.0 lb

## 2015-05-21 DIAGNOSIS — J302 Other seasonal allergic rhinitis: Secondary | ICD-10-CM

## 2015-05-21 DIAGNOSIS — Z1211 Encounter for screening for malignant neoplasm of colon: Secondary | ICD-10-CM

## 2015-05-21 DIAGNOSIS — M797 Fibromyalgia: Secondary | ICD-10-CM

## 2015-05-21 DIAGNOSIS — Z23 Encounter for immunization: Secondary | ICD-10-CM | POA: Diagnosis not present

## 2015-05-21 DIAGNOSIS — M5412 Radiculopathy, cervical region: Secondary | ICD-10-CM

## 2015-05-21 DIAGNOSIS — F329 Major depressive disorder, single episode, unspecified: Secondary | ICD-10-CM

## 2015-05-21 DIAGNOSIS — F32A Depression, unspecified: Secondary | ICD-10-CM

## 2015-05-21 DIAGNOSIS — J4530 Mild persistent asthma, uncomplicated: Secondary | ICD-10-CM | POA: Diagnosis not present

## 2015-05-21 DIAGNOSIS — F411 Generalized anxiety disorder: Secondary | ICD-10-CM

## 2015-05-21 MED ORDER — NAPROXEN 500 MG PO TABS: 500.0000 mg | ORAL_TABLET | Freq: Two times a day (BID) | ORAL | Status: DC

## 2015-05-21 MED FILL — NAPROXEN 500 MG TABLET: 500 | 30 days supply | Qty: 60 | Fill #0

## 2015-05-21 NOTE — Progress Notes (Signed)
   Subjective:    Patient ID: Robin Conrad, female    DOB: 09-18-1950, 65 y.o.   MRN: [id number]  HPI  F/U Asthma - she follows with asthma and allergy.  She has been off her allergy shots for about 6 weeks.  She has been sick with cough and congestion x 2 weeks.  The pollen has started to rise in the last few weeks.    Fibromyalgia - Haivng trouble again wth her neck.  Has had 2 neck surgeries and she is occ waking up with her hands getting numb.    F/u depression/GAD - she has been moving and has been worn out.    Review of Systems     Objective:   Physical Exam  Constitutional: She is oriented to person, place, and time. She appears well-developed and well-nourished.  HENT:  Head: Normocephalic and atraumatic.  Cardiovascular: Normal rate, regular rhythm and normal heart sounds.   Pulmonary/Chest: Effort normal and breath sounds normal.  Neurological: She is alert and oriented to person, place, and time.  Skin: Skin is warm and dry.  Psychiatric: She has a normal mood and affect. Her behavior is normal.          Assessment & Plan:  Fibromyalgia - she has been trying to walk more and is using her fitbit to help her.  Let try naproxen while she is moving and her pain is flaring.  I'll up in 3 months.  Depression/Anxiety - GAD- 7 score of 4 and PHQ- 9 score is neg.    Asthma/allergies - needs to get back win the allergist.  Symptoms more recently exacerbated by depressed or infection but she is feeling better.  Cervical pain with radiculapthy - did give her an NSAID to take short-term. Recommended naproxen which can last closer to 12 hours. Work on gentle stretching heating pad and can always consider physical therapy again.  Colon cancer screening - she has called her insurance to see if cologuard is covered.  She will call us back to let us know.

## 2015-05-22 ENCOUNTER — Ambulatory Visit (INDEPENDENT_AMBULATORY_CARE_PROVIDER_SITE_OTHER): Payer: PRIVATE HEALTH INSURANCE | Admitting: Licensed Clinical Social Worker

## 2015-05-22 DIAGNOSIS — F419 Anxiety disorder, unspecified: Secondary | ICD-10-CM

## 2015-05-22 DIAGNOSIS — F3341 Major depressive disorder, recurrent, in partial remission: Secondary | ICD-10-CM

## 2015-06-04 ENCOUNTER — Other Ambulatory Visit: Payer: Self-pay | Admitting: Pulmonary Disease

## 2015-06-04 DIAGNOSIS — H903 Sensorineural hearing loss, bilateral: Secondary | ICD-10-CM | POA: Insufficient documentation

## 2015-06-04 MED FILL — LANSOPRAZOLE DR 30 MG CAP: 30 | 30 days supply | Qty: 60 | Fill #0

## 2015-06-04 MED FILL — traMADol HCL 50 MG TABS: 50 | 10 days supply | Qty: 60 | Fill #1

## 2015-06-05 ENCOUNTER — Ambulatory Visit (INDEPENDENT_AMBULATORY_CARE_PROVIDER_SITE_OTHER): Payer: PRIVATE HEALTH INSURANCE | Admitting: Licensed Clinical Social Worker

## 2015-06-05 DIAGNOSIS — F419 Anxiety disorder, unspecified: Secondary | ICD-10-CM

## 2015-06-05 DIAGNOSIS — F3341 Major depressive disorder, recurrent, in partial remission: Secondary | ICD-10-CM | POA: Diagnosis not present

## 2015-06-11 ENCOUNTER — Other Ambulatory Visit: Payer: Self-pay | Admitting: Family Medicine

## 2015-06-11 MED FILL — CYCLOBENZAPRINE 10 MG TAB: 10 | 30 days supply | Qty: 60 | Fill #0

## 2015-06-12 ENCOUNTER — Other Ambulatory Visit: Payer: Self-pay | Admitting: Family Medicine

## 2015-06-12 MED FILL — GABAPENTIN 600 MG TABLET: 600 | 30 days supply | Qty: 90 | Fill #0

## 2015-06-19 ENCOUNTER — Other Ambulatory Visit: Payer: Self-pay | Admitting: Family Medicine

## 2015-06-19 MED FILL — ZOLPIDEM TARTRATE 5 MG TAB: 5 | 30 days supply | Qty: 30 | Fill #4

## 2015-06-20 MED FILL — LEVOTHYROXINE 50 MCG TABLET: 50 | 30 days supply | Qty: 30 | Fill #0

## 2015-06-20 MED FILL — BUPROPION HCL XL 150 MG TAB: 150 | 30 days supply | Qty: 90 | Fill #4

## 2015-06-26 ENCOUNTER — Ambulatory Visit (INDEPENDENT_AMBULATORY_CARE_PROVIDER_SITE_OTHER): Payer: PRIVATE HEALTH INSURANCE | Admitting: Licensed Clinical Social Worker

## 2015-06-26 DIAGNOSIS — F3341 Major depressive disorder, recurrent, in partial remission: Secondary | ICD-10-CM | POA: Diagnosis not present

## 2015-06-26 DIAGNOSIS — F419 Anxiety disorder, unspecified: Secondary | ICD-10-CM

## 2015-07-02 MED FILL — traMADol HCL 50 MG TABS: 50 | 10 days supply | Qty: 60 | Fill #2

## 2015-07-03 ENCOUNTER — Other Ambulatory Visit: Payer: Self-pay | Admitting: *Deleted

## 2015-07-03 MED ORDER — ALPRAZOLAM 0.25 MG PO TABS: 0.2500 mg | ORAL_TABLET | Freq: Three times a day (TID) | ORAL | Status: DC | PRN

## 2015-07-03 MED FILL — ALPRAZolam 0.25 MG TABS: 0.25 | 30 days supply | Qty: 90 | Fill #0

## 2015-07-09 MED FILL — LANSOPRAZOLE DR 30 MG CAP: 30 | 30 days supply | Qty: 60 | Fill #1

## 2015-07-09 MED FILL — RESTASIS 0.05% EYE EMULSION: 0.05 | 30 days supply | Qty: 60 | Fill #2

## 2015-07-17 ENCOUNTER — Other Ambulatory Visit: Payer: Self-pay | Admitting: Family Medicine

## 2015-07-17 MED FILL — GABAPENTIN 600 MG TABLET: 600 | 30 days supply | Qty: 90 | Fill #1

## 2015-07-17 MED FILL — BUPROPION HCL XL 150 MG TAB: 150 | 30 days supply | Qty: 90 | Fill #5

## 2015-07-17 MED FILL — LEVOTHYROXINE 50 MCG TABLET: 50 | 30 days supply | Qty: 30 | Fill #1

## 2015-07-17 MED FILL — ZOLPIDEM TARTRATE 5 MG TAB: 5 | 30 days supply | Qty: 30 | Fill #5

## 2015-07-17 MED FILL — CYCLOBENZAPRINE 10 MG TAB: 10 | 30 days supply | Qty: 60 | Fill #0

## 2015-07-24 ENCOUNTER — Ambulatory Visit (INDEPENDENT_AMBULATORY_CARE_PROVIDER_SITE_OTHER): Payer: PRIVATE HEALTH INSURANCE | Admitting: Licensed Clinical Social Worker

## 2015-07-24 DIAGNOSIS — F419 Anxiety disorder, unspecified: Secondary | ICD-10-CM | POA: Diagnosis not present

## 2015-07-24 DIAGNOSIS — F3341 Major depressive disorder, recurrent, in partial remission: Secondary | ICD-10-CM

## 2015-07-30 MED FILL — traMADol HCL 50 MG TABS: 50 | 10 days supply | Qty: 60 | Fill #3

## 2015-07-30 MED FILL — ALPRAZolam 0.25 MG TABS: 0.25 | 30 days supply | Qty: 90 | Fill #1

## 2015-08-19 ENCOUNTER — Other Ambulatory Visit: Payer: Self-pay | Admitting: Family Medicine

## 2015-08-19 MED FILL — BUPROPION HCL XL 150 MG TAB: 150 | 30 days supply | Qty: 90 | Fill #0

## 2015-08-19 MED FILL — LEVOTHYROXINE 50 MCG TABLET: 50 | 30 days supply | Qty: 30 | Fill #2

## 2015-08-19 MED FILL — GABAPENTIN 600 MG TABLET: 600 | 30 days supply | Qty: 90 | Fill #2

## 2015-08-19 MED FILL — ZOLPIDEM TARTRATE 5 MG TAB: 5 | 30 days supply | Qty: 30 | Fill #0

## 2015-08-21 ENCOUNTER — Ambulatory Visit (INDEPENDENT_AMBULATORY_CARE_PROVIDER_SITE_OTHER): Payer: PRIVATE HEALTH INSURANCE | Admitting: Licensed Clinical Social Worker

## 2015-08-21 ENCOUNTER — Ambulatory Visit: Payer: Self-pay | Admitting: Family Medicine

## 2015-08-21 DIAGNOSIS — F3341 Major depressive disorder, recurrent, in partial remission: Secondary | ICD-10-CM

## 2015-08-21 DIAGNOSIS — F419 Anxiety disorder, unspecified: Secondary | ICD-10-CM | POA: Diagnosis not present

## 2015-08-22 ENCOUNTER — Ambulatory Visit (INDEPENDENT_AMBULATORY_CARE_PROVIDER_SITE_OTHER): Payer: PRIVATE HEALTH INSURANCE | Admitting: Family Medicine

## 2015-08-22 ENCOUNTER — Encounter: Payer: Self-pay | Admitting: Family Medicine

## 2015-08-22 VITALS — BP 132/51 | HR 93 | Wt 121.0 lb

## 2015-08-22 DIAGNOSIS — K2100 Gastro-esophageal reflux disease with esophagitis, without bleeding: Secondary | ICD-10-CM

## 2015-08-22 DIAGNOSIS — M5412 Radiculopathy, cervical region: Secondary | ICD-10-CM | POA: Diagnosis not present

## 2015-08-22 DIAGNOSIS — M797 Fibromyalgia: Secondary | ICD-10-CM | POA: Diagnosis not present

## 2015-08-22 MED ORDER — RANITIDINE HCL 75 MG PO TABS: 75.0000 mg | ORAL_TABLET | Freq: Two times a day (BID) | ORAL | Status: DC

## 2015-08-22 MED ORDER — DICLOFENAC SODIUM 1 % TD GEL: 2.0000 g | Freq: Four times a day (QID) | TRANSDERMAL | Status: DC

## 2015-08-22 MED ORDER — ALPRAZOLAM 0.25 MG PO TABS: 0.2500 mg | ORAL_TABLET | Freq: Three times a day (TID) | ORAL | Status: DC | PRN

## 2015-08-22 NOTE — Progress Notes (Signed)
Subjective:    CC: f/U fibromyalgia  HPI:  Follow-up fibromyalgia- lot more pain across the neck and into the upper back and shoulders. She's can elect sensation across her bra strap area.she's been using a heating pad. She says even the tramadol doesn't seem to help her pain.  Depression/anxiety - she is concerned because her insurance would not pay for the alprazolam. She is using it most days 3 times a day but occasionally will take it twice a day. She is on a low dose of 0.25.  GERD- she is now on ranitidine and so the PPI. She's trying that instead to try to reduce cost. Criselda Peaches does seem to be working which is good.  .She is thinking about starting some massage therapy. Also offered physical therapy, if she's not improving with massage therapy.   Past medical history, Surgical history, Family history not pertinant except as noted below, Social history, Allergies, and medications have been entered into the medical record, reviewed, and corrections made.   Review of Systems: No fevers, chills, night sweats, weight loss, chest pain, or shortness of breath.   Objective:    General: Well Developed, well nourished, and in no acute distress.  Neuro: Alert and oriented x3, extra-ocular muscles intact, sensation grossly intact.  HEENT: Normocephalic, atraumatic  Skin: Warm and dry, no rashes. Cardiac: Regular rate and rhythm, no murmurs rubs or gallops, no lower extremity edema.  Respiratory: Clear to auscultation bilaterally. Not using accessory muscles, speaking in full sentences.   Impression and Recommendations:   Fibromyalgia- trial of massage therapy. If not improving she'll call back and we will consider physical therapy but we will need to verify that it is covered by her insurance first. Unfortunately the last time she did physical therapy that particular location was not covered and they had to pay out of pocket. Will also try to send of her Voltaren gel which does work well for  her.  Depression/anxiety-we did discuss weaning her alprazolam. Discussed in this very slowly by reducing her quantity by 5 tabs per month.she is potentially interested in doing biofeedback. She is [place] working with a Administrator, [employer]. Will check into this option for her.  GERD - within her perception for ranitidine to see if it's cheaper than the over-the-counter version.

## 2015-08-22 NOTE — Patient Instructions (Signed)
Sent over the generic Volteran gel to use. Sent over a script for the ranitidine to see if cheaper if run as a script.  Will check on the Biofeedback

## 2015-09-01 ENCOUNTER — Telehealth: Payer: Self-pay | Admitting: *Deleted

## 2015-09-01 ENCOUNTER — Ambulatory Visit (INDEPENDENT_AMBULATORY_CARE_PROVIDER_SITE_OTHER): Payer: PRIVATE HEALTH INSURANCE

## 2015-09-01 ENCOUNTER — Ambulatory Visit (INDEPENDENT_AMBULATORY_CARE_PROVIDER_SITE_OTHER): Payer: PRIVATE HEALTH INSURANCE | Admitting: Family Medicine

## 2015-09-01 ENCOUNTER — Encounter: Payer: Self-pay | Admitting: Family Medicine

## 2015-09-01 VITALS — BP 122/70 | HR 101 | Wt 122.0 lb

## 2015-09-01 DIAGNOSIS — M542 Cervicalgia: Secondary | ICD-10-CM

## 2015-09-01 IMAGING — DX DG CERVICAL SPINE COMPLETE 4+V
5 series · 5 of 5 positions shown · non-contrast
Comparison: Not

CLINICAL DATA: LEFT side neck pain and popping for few months,
history of prior cervical spine surgeries most recently 12 years
ago, suspected degenerative disc disease

EXAM:
CERVICAL SPINE - COMPLETE 4+ VIEW

[c-spine lat]
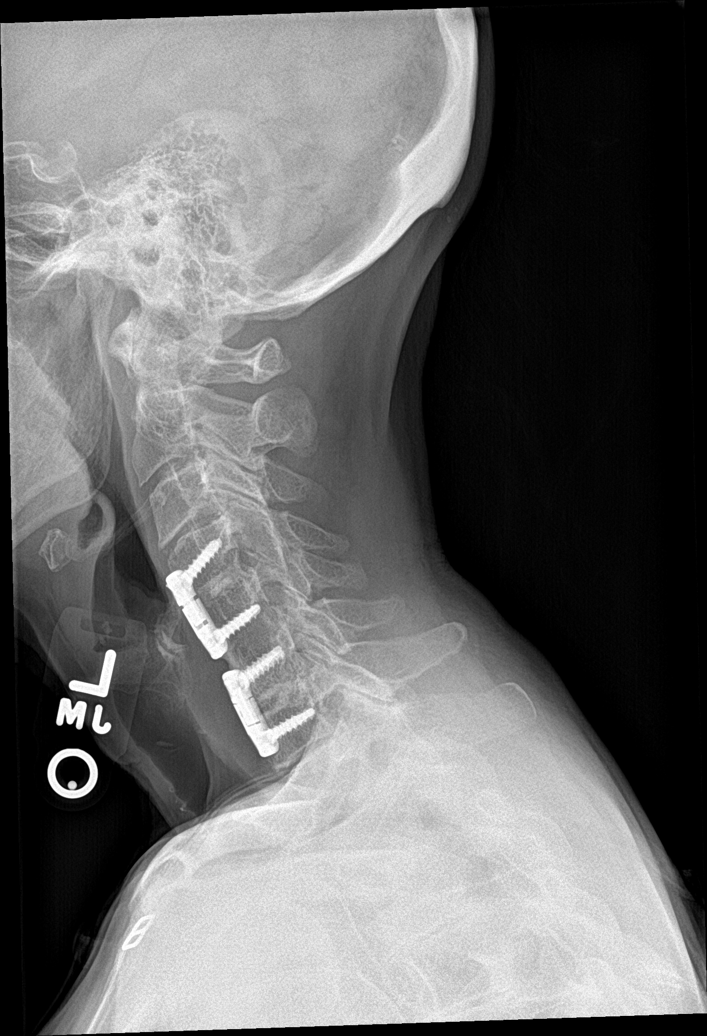

[c-spine obl (1 of 2)]
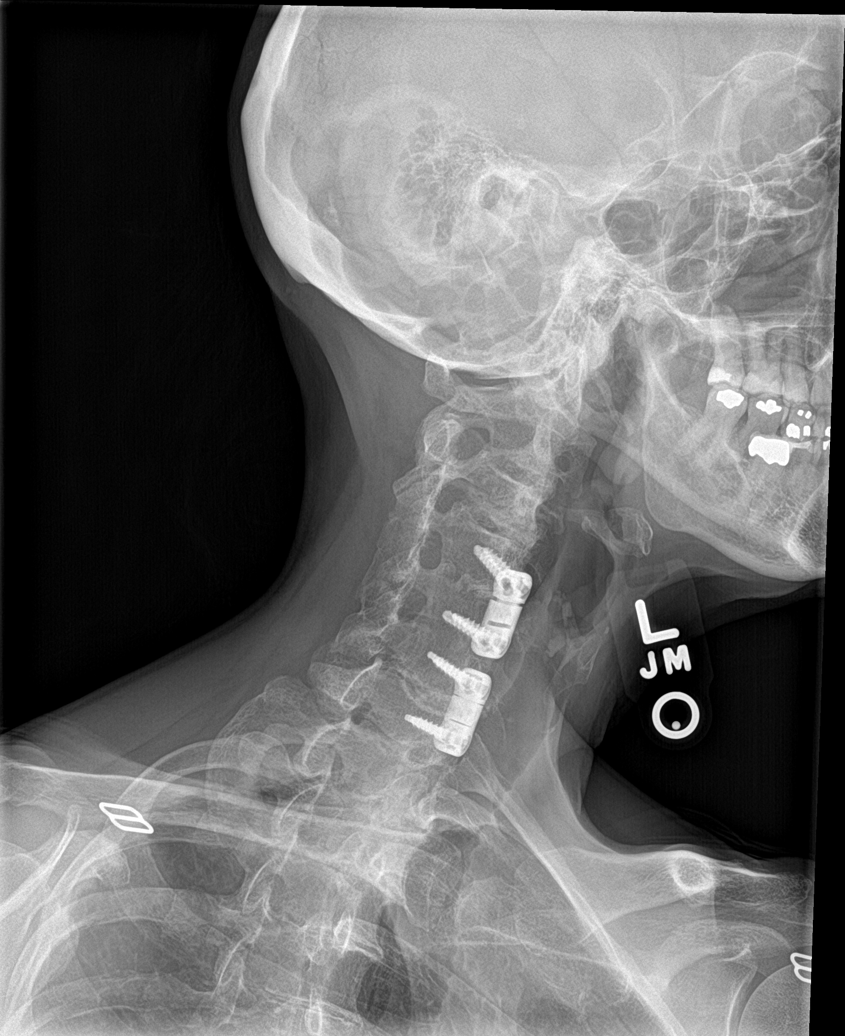

[c-spine obl (2 of 2)]
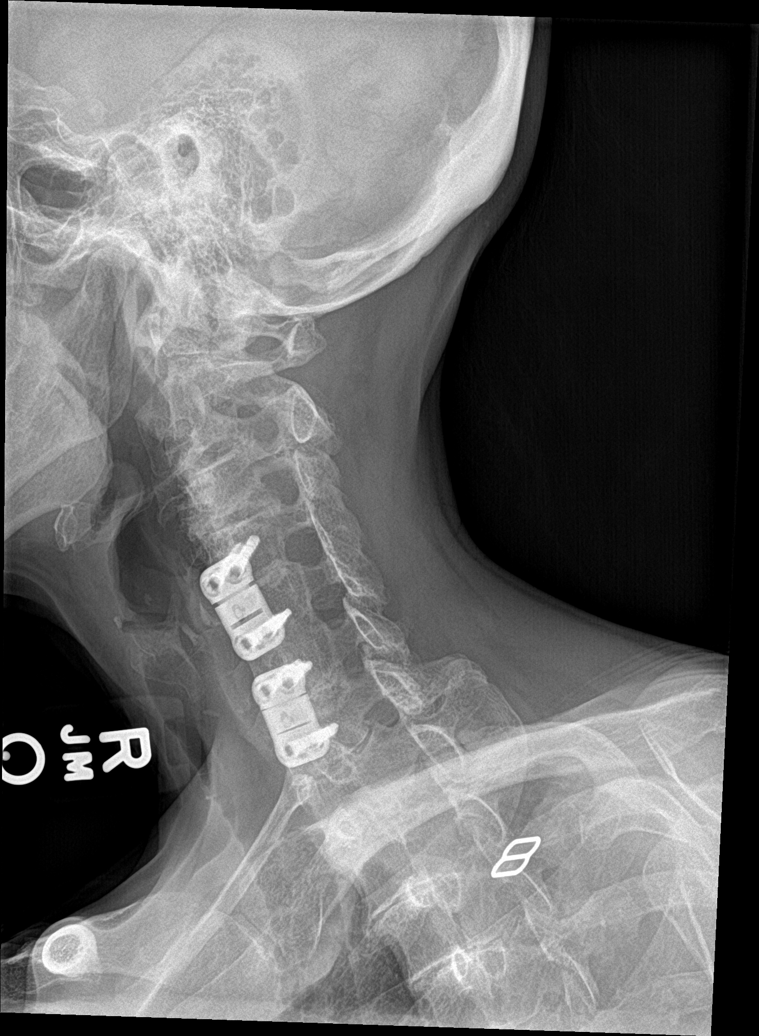

[c-spine ap]
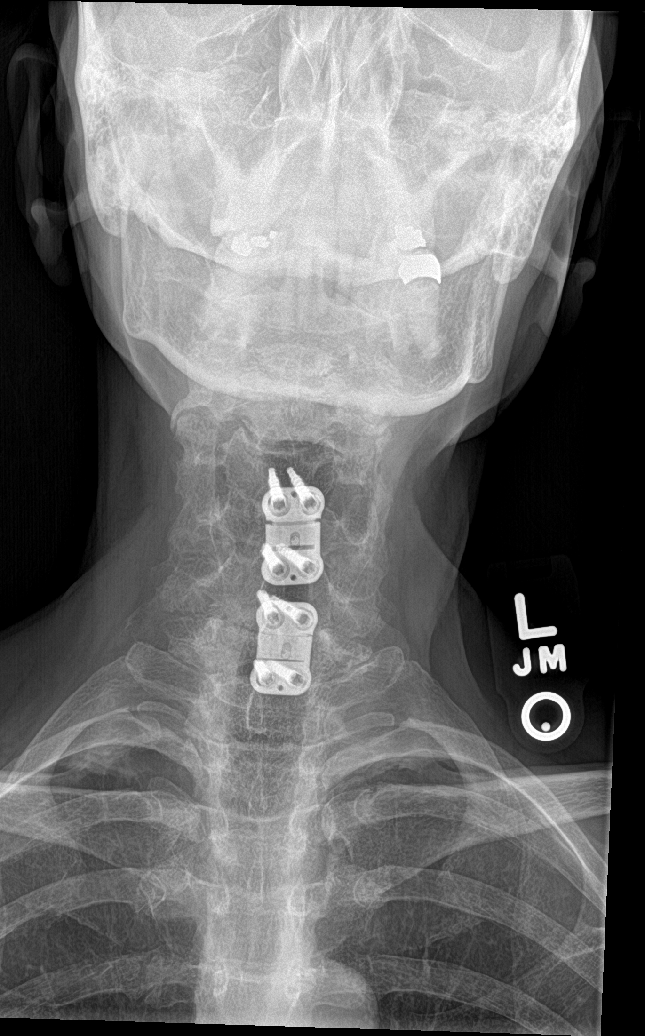

[c-spine open mouth]
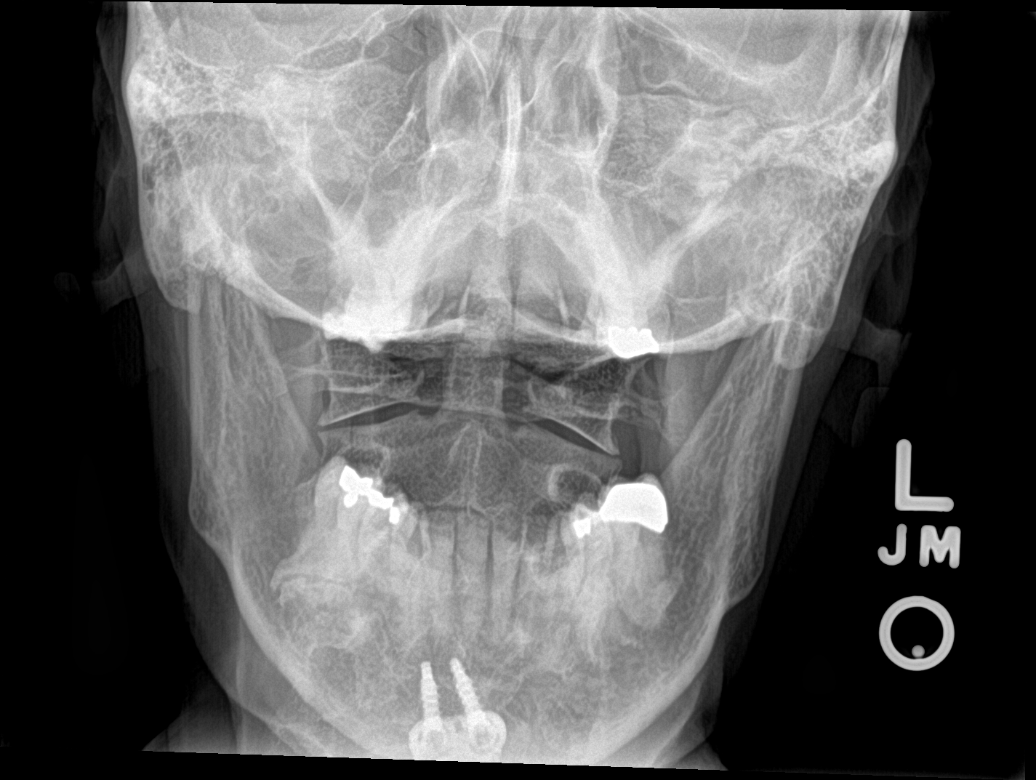

[5 of 5 positions shown; findings below may reference images not displayed]

FINDINGS: Prevertebral soft tissues normal thickness.

Bones appear mildly demineralized.

Prior anterior fusions with plate and screws at C4-C5 and C6-C7.

Additional prior fusion C5-C6 without hardware.

Encroachment upon RIGHT C6-C7 neural foramen by uncovertebral spurs.

Remaining foramina grossly patent.

No fracture, subluxation or bone destruction.

Lung apices clear.
IMPRESSION: Prior anterior fusion of C4-C7 as above with bony RIGHT neural
foraminal encroachments C6-C7 by uncovertebral spurs.

## 2015-09-01 MED ORDER — METHYLPREDNISOLONE ACETATE 80 MG/ML IJ SUSP
80.0000 mg | Freq: Once | INTRAMUSCULAR | Status: AC
  Administered 2015-09-01: 80 mg via INTRAMUSCULAR

## 2015-09-01 NOTE — Telephone Encounter (Signed)
Pt called and stated that when she was here last wk she wasn't able to speak w/Dr. Madilyn Fireman about her muscle pain due to time. She is having pain in her shoulder that goes into her neck that is very painful. Pt has had surgery for this and was told that after the surgery they fixed the problem but the pain still persists.  She stated that she is going out of town on Thursday and wants to know what she should do if she should come in for this or if there are any suggestions appt made for today w/Dr. Georgina Snell.Audelia Hives [place]

## 2015-09-01 NOTE — Patient Instructions (Signed)
Thank you for coming in today. Get xray. We will call with results.  Return as needed.  Attend PT.
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
## 2015-09-02 ENCOUNTER — Other Ambulatory Visit: Payer: Self-pay | Admitting: Family Medicine

## 2015-09-02 MED FILL — CYCLOBENZAPRINE 10 MG TAB: 10 | 30 days supply | Qty: 60 | Fill #0

## 2015-09-02 MED FILL — traMADol HCL 50 MG TABS: 50 | 10 days supply | Qty: 60 | Fill #4

## 2015-09-02 NOTE — Progress Notes (Signed)
Quick Note:  Lots of arthritis in the neck xray ______

## 2015-09-10 MED FILL — RESTASIS 0.05% EYE EMULSION: 0.05 | 30 days supply | Qty: 60 | Fill #3

## 2015-09-11 ENCOUNTER — Ambulatory Visit (INDEPENDENT_AMBULATORY_CARE_PROVIDER_SITE_OTHER): Payer: PRIVATE HEALTH INSURANCE | Admitting: Licensed Clinical Social Worker

## 2015-09-11 DIAGNOSIS — F419 Anxiety disorder, unspecified: Secondary | ICD-10-CM

## 2015-09-11 DIAGNOSIS — F3341 Major depressive disorder, recurrent, in partial remission: Secondary | ICD-10-CM | POA: Diagnosis not present

## 2015-09-15 MED FILL — GABAPENTIN 600 MG TABLET: 600 | 30 days supply | Qty: 90 | Fill #3

## 2015-09-15 MED FILL — ZOLPIDEM TARTRATE 5 MG TAB: 5 | 30 days supply | Qty: 30 | Fill #1

## 2015-09-15 MED FILL — ALPRAZolam 0.25 MG TABS: 0.25 | 30 days supply | Qty: 85 | Fill #0

## 2015-09-15 MED FILL — traMADol HCL 50 MG TABS: 50 | 10 days supply | Qty: 60 | Fill #5

## 2015-09-15 MED FILL — LEVOTHYROXINE 50 MCG TABLET: 50 | 30 days supply | Qty: 30 | Fill #3

## 2015-09-18 DIAGNOSIS — J3089 Other allergic rhinitis: Secondary | ICD-10-CM | POA: Diagnosis not present

## 2015-09-18 DIAGNOSIS — J301 Allergic rhinitis due to pollen: Secondary | ICD-10-CM | POA: Diagnosis not present

## 2015-09-18 DIAGNOSIS — J3081 Allergic rhinitis due to animal (cat) (dog) hair and dander: Secondary | ICD-10-CM | POA: Diagnosis not present

## 2015-09-19 ENCOUNTER — Other Ambulatory Visit: Payer: Self-pay | Admitting: Family Medicine

## 2015-09-19 DIAGNOSIS — Z0000 Encounter for general adult medical examination without abnormal findings: Secondary | ICD-10-CM

## 2015-09-22 ENCOUNTER — Telehealth: Payer: Self-pay

## 2015-09-22 MED FILL — NAPROXEN 500 MG TABLET: 500 | 30 days supply | Qty: 60 | Fill #1

## 2015-09-22 MED FILL — BUPROPION HCL XL 150 MG TAB: 150 | 30 days supply | Qty: 90 | Fill #1

## 2015-09-22 NOTE — Telephone Encounter (Signed)
Ok to initiate a PA on the medication. She may have to try trazodone first or rozerem.

## 2015-09-29 ENCOUNTER — Telehealth: Payer: Self-pay | Admitting: *Deleted

## 2015-09-29 NOTE — Telephone Encounter (Signed)
PA initiated (Key: [id number])

## 2015-10-01 ENCOUNTER — Other Ambulatory Visit: Payer: Self-pay | Admitting: *Deleted

## 2015-10-01 ENCOUNTER — Ambulatory Visit (INDEPENDENT_AMBULATORY_CARE_PROVIDER_SITE_OTHER): Payer: Medicare Other

## 2015-10-01 DIAGNOSIS — Z0000 Encounter for general adult medical examination without abnormal findings: Secondary | ICD-10-CM

## 2015-10-01 DIAGNOSIS — Z1231 Encounter for screening mammogram for malignant neoplasm of breast: Secondary | ICD-10-CM | POA: Diagnosis not present

## 2015-10-01 IMAGING — MG DIGITAL SCREENING BILATERAL MAMMOGRAM WITH CAD
5 series · 5 of 5 positions shown · non-contrast
Comparison: Previous exam(s).

CLINICAL DATA: Screening.

EXAM:
DIGITAL SCREENING BILATERAL MAMMOGRAM WITH CAD

[R CC]
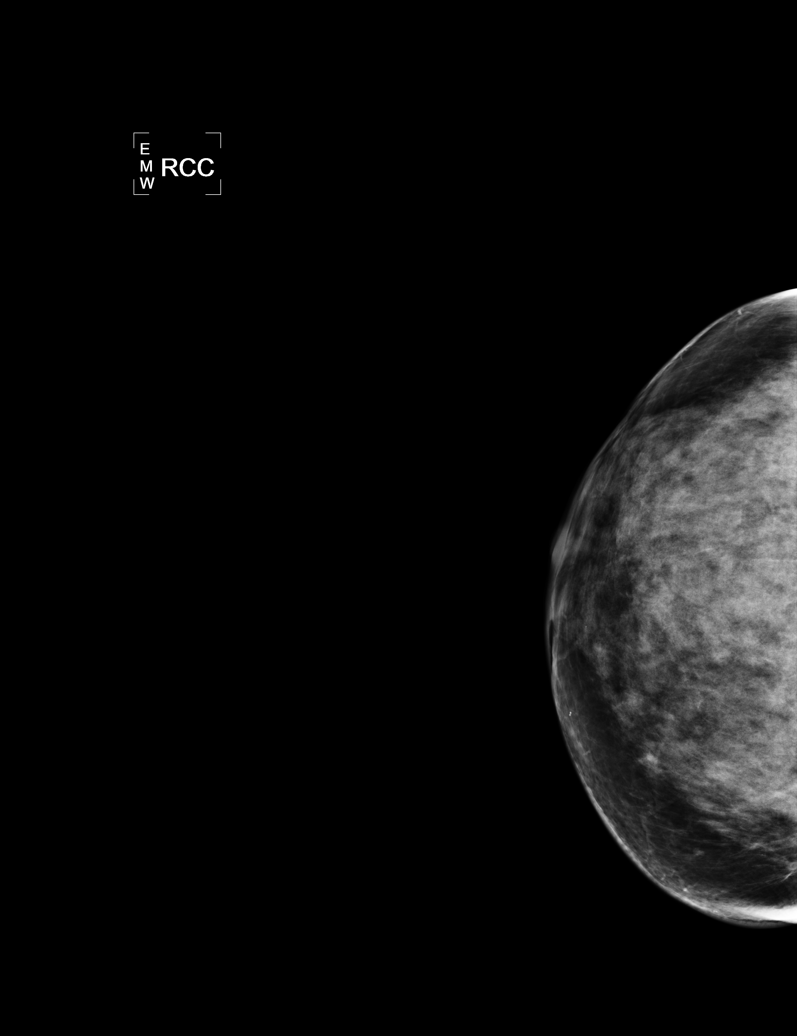

[L CC]
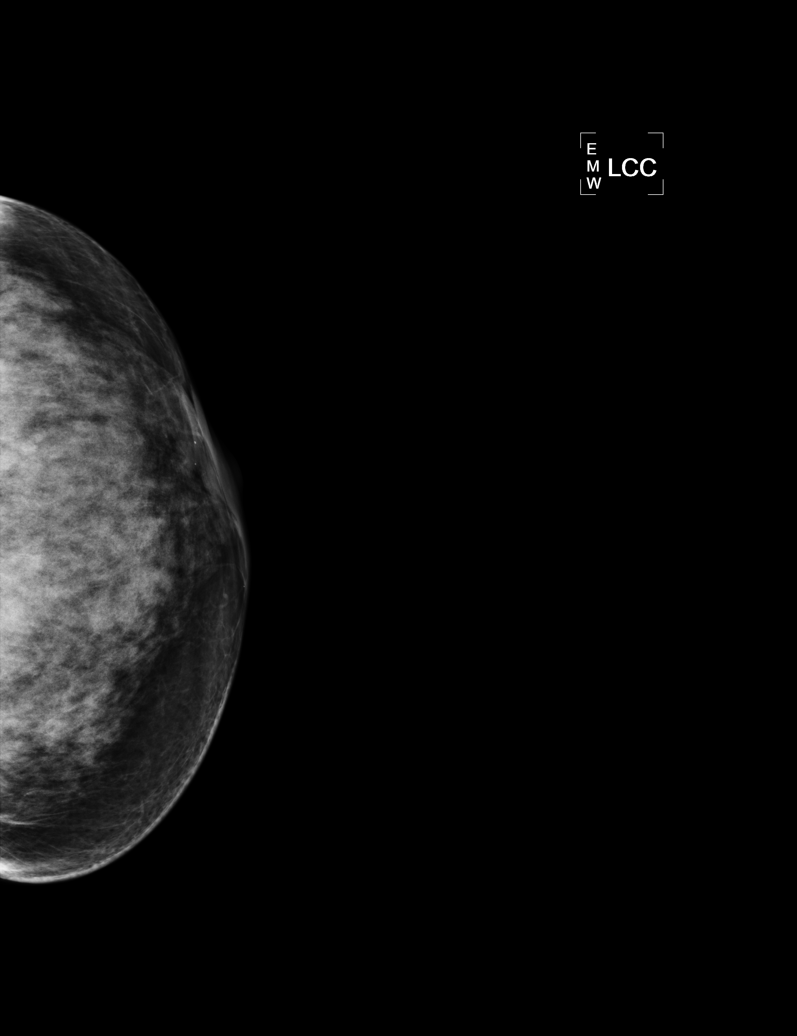

[L MLO]
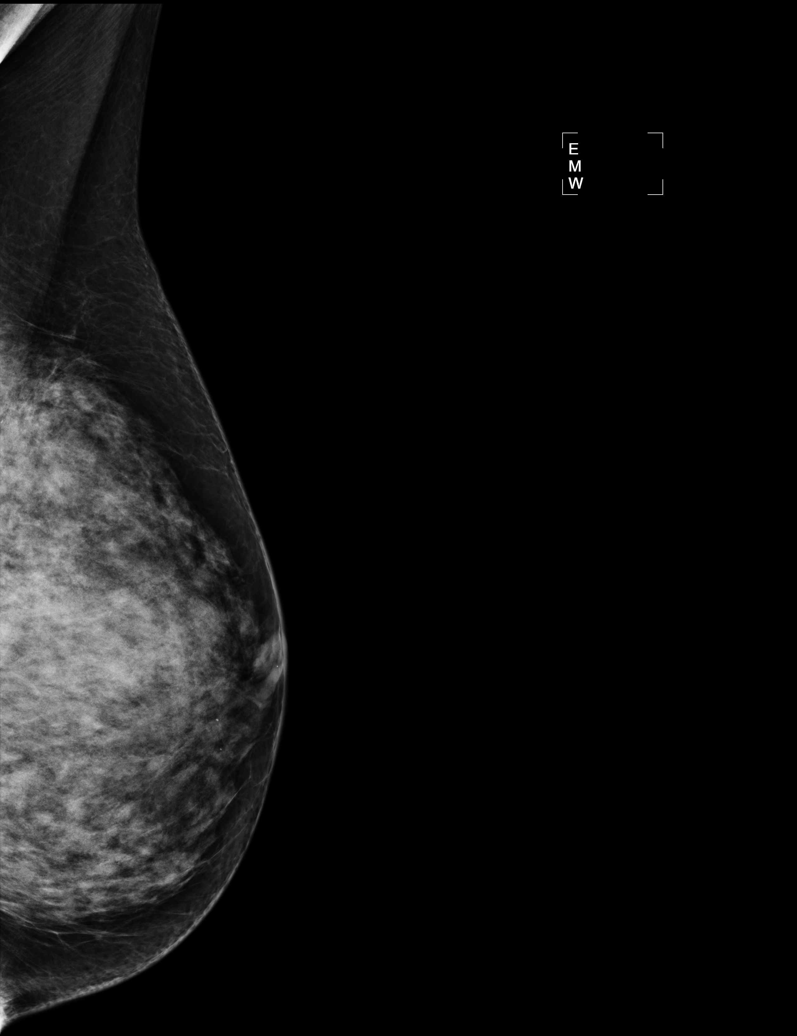

[R MLO]
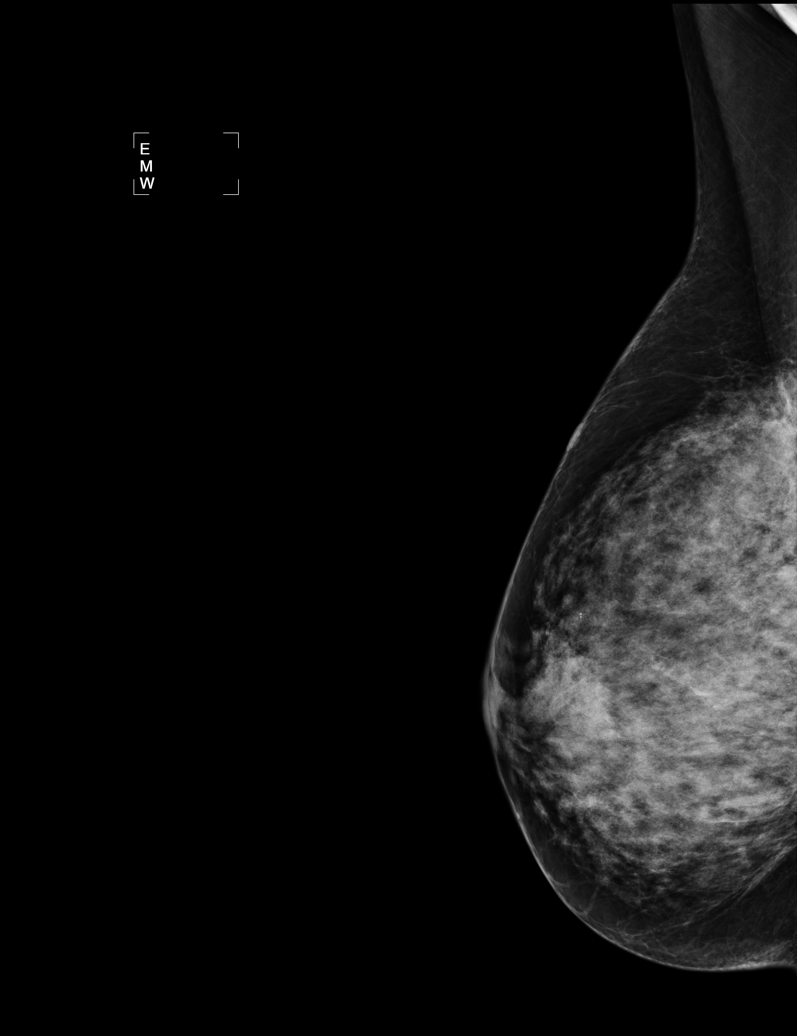

[R XCCL]
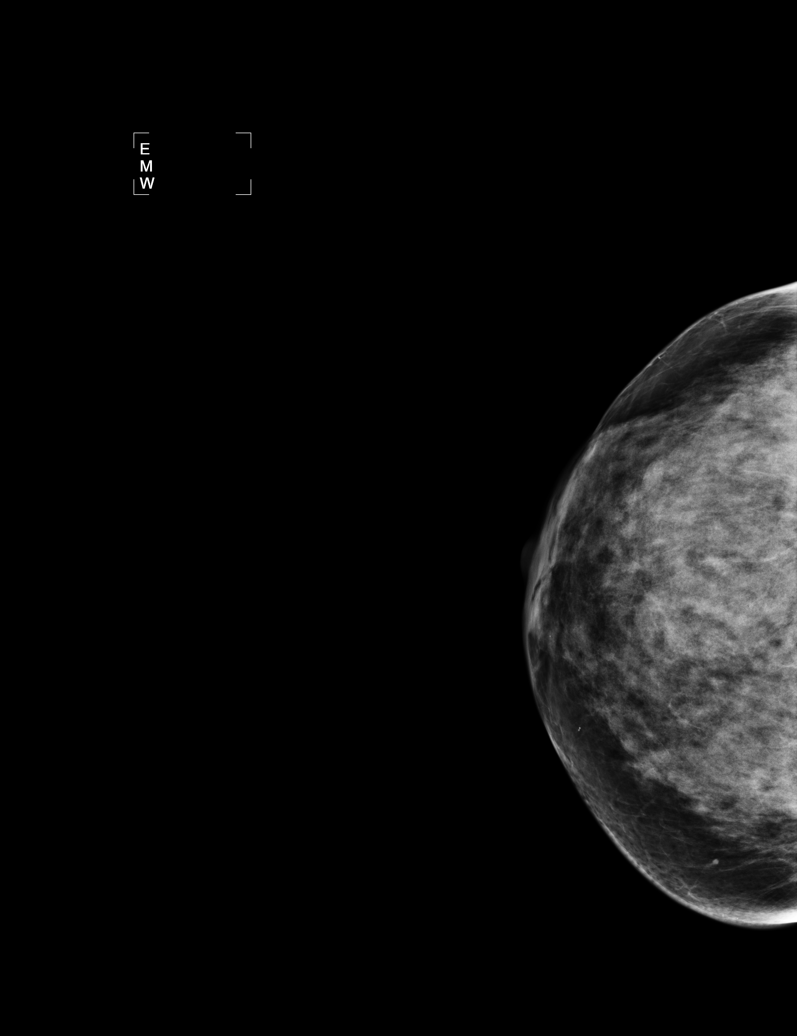

[5 of 5 positions shown; findings below may reference images not displayed]

ACR Breast Density Category d: The breast tissue is extremely dense,
which lowers the sensitivity of mammography.
FINDINGS: There are no findings suspicious for malignancy. Images were
processed with CAD.
IMPRESSION: No mammographic evidence of malignancy. A result letter of this
screening mammogram will be mailed directly to the patient.

RECOMMENDATION:
Screening mammogram in one year. (Code:[ZH])

BI-RADS CATEGORY  1: Negative.

## 2015-10-01 MED ORDER — TRAMADOL HCL 50 MG PO TABS: ORAL_TABLET | ORAL | Status: DC

## 2015-10-01 MED FILL — traMADol HCL 50 MG TABS: 50 | 10 days supply | Qty: 60 | Fill #0

## 2015-10-02 ENCOUNTER — Ambulatory Visit: Payer: PRIVATE HEALTH INSURANCE | Admitting: Licensed Clinical Social Worker

## 2015-10-02 DIAGNOSIS — J3081 Allergic rhinitis due to animal (cat) (dog) hair and dander: Secondary | ICD-10-CM | POA: Diagnosis not present

## 2015-10-02 DIAGNOSIS — J3089 Other allergic rhinitis: Secondary | ICD-10-CM | POA: Diagnosis not present

## 2015-10-02 DIAGNOSIS — J301 Allergic rhinitis due to pollen: Secondary | ICD-10-CM | POA: Diagnosis not present

## 2015-10-07 ENCOUNTER — Other Ambulatory Visit: Payer: Self-pay | Admitting: Family Medicine

## 2015-10-07 MED FILL — CYCLOBENZAPRINE 10 MG TAB: 10 | 30 days supply | Qty: 60 | Fill #0

## 2015-10-07 NOTE — Telephone Encounter (Signed)
Sent to [facility] July 17  The plan will fax you a determination, typically within 1 to 5 business days.

## 2015-10-08 DIAGNOSIS — J3081 Allergic rhinitis due to animal (cat) (dog) hair and dander: Secondary | ICD-10-CM | POA: Diagnosis not present

## 2015-10-08 DIAGNOSIS — J301 Allergic rhinitis due to pollen: Secondary | ICD-10-CM | POA: Diagnosis not present

## 2015-10-08 DIAGNOSIS — J3089 Other allergic rhinitis: Secondary | ICD-10-CM | POA: Diagnosis not present

## 2015-10-15 ENCOUNTER — Other Ambulatory Visit: Payer: Self-pay | Admitting: Family Medicine

## 2015-10-15 MED FILL — LEVOTHYROXINE 50 MCG TABLET: 50 | 30 days supply | Qty: 30 | Fill #4

## 2015-10-15 MED FILL — GABAPENTIN 600 MG TABLET: 600 | 30 days supply | Qty: 90 | Fill #0

## 2015-10-16 NOTE — Telephone Encounter (Signed)
Received another PA request for Robin Conrad, checked on status and insurance attached to initial request termed in [place]. Initiated new [id number] - PA Case ID: [id number]

## 2015-10-20 MED FILL — ZOLPIDEM TARTRATE 5 MG TAB: 5 | 30 days supply | Qty: 30 | Fill #2

## 2015-10-22 ENCOUNTER — Encounter: Payer: Self-pay | Admitting: Family Medicine

## 2015-10-22 ENCOUNTER — Ambulatory Visit (INDEPENDENT_AMBULATORY_CARE_PROVIDER_SITE_OTHER): Payer: Medicare Other | Admitting: Family Medicine

## 2015-10-22 VITALS — BP 123/55 | HR 98 | Resp 12 | Ht 66.0 in | Wt 122.8 lb

## 2015-10-22 DIAGNOSIS — F411 Generalized anxiety disorder: Secondary | ICD-10-CM

## 2015-10-22 DIAGNOSIS — M797 Fibromyalgia: Secondary | ICD-10-CM | POA: Diagnosis not present

## 2015-10-22 DIAGNOSIS — R4789 Other speech disturbances: Secondary | ICD-10-CM

## 2015-10-22 DIAGNOSIS — E039 Hypothyroidism, unspecified: Secondary | ICD-10-CM

## 2015-10-22 DIAGNOSIS — G4700 Insomnia, unspecified: Secondary | ICD-10-CM | POA: Insufficient documentation

## 2015-10-22 DIAGNOSIS — F329 Major depressive disorder, single episode, unspecified: Secondary | ICD-10-CM | POA: Diagnosis not present

## 2015-10-22 DIAGNOSIS — F32A Depression, unspecified: Secondary | ICD-10-CM

## 2015-10-22 DIAGNOSIS — N952 Postmenopausal atrophic vaginitis: Secondary | ICD-10-CM

## 2015-10-22 DIAGNOSIS — R479 Unspecified speech disturbances: Secondary | ICD-10-CM

## 2015-10-22 DIAGNOSIS — M542 Cervicalgia: Secondary | ICD-10-CM

## 2015-10-22 LAB — CBC
HCT: 37.5 % (ref 35.0–45.0)
Hemoglobin: 12.8 g/dL (ref 11.7–15.5)
MCH: 30.5 pg (ref 27.0–33.0)
MCHC: 34.1 g/dL (ref 32.0–36.0)
MCV: 89.3 fL (ref 80.0–100.0)
MPV: 9.6 fL (ref 7.5–12.5)
PLATELETS: 200 10*3/uL (ref 140–400)
RBC: 4.2 MIL/uL (ref 3.80–5.10)
RDW: 13.7 % (ref 11.0–15.0)
WBC: 5.7 10*3/uL (ref 3.8–10.8)

## 2015-10-22 LAB — VITAMIN B12: VITAMIN B 12: 580 pg/mL (ref 200–1100)

## 2015-10-22 LAB — TSH: TSH: 1.7 m[IU]/L

## 2015-10-22 NOTE — Patient Instructions (Signed)
Check with your insurance to see if they will cover Premarin.
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
## 2015-10-23 ENCOUNTER — Other Ambulatory Visit: Payer: Self-pay | Admitting: Family Medicine

## 2015-10-23 LAB — COMPLETE METABOLIC PANEL WITH GFR
ALT: 18 U/L (ref 6–29)
AST: 21 U/L (ref 10–35)
Albumin: 4 g/dL (ref 3.6–5.1)
Alkaline Phosphatase: 110 U/L (ref 33–130)
BILIRUBIN TOTAL: 0.6 mg/dL (ref 0.2–1.2)
BUN: 17 mg/dL (ref 7–25)
CALCIUM: 9.2 mg/dL (ref 8.6–10.4)
CO2: 27 mmol/L (ref 20–31)
CREATININE: 0.92 mg/dL (ref 0.50–0.99)
Chloride: 105 mmol/L (ref 98–110)
GFR, EST AFRICAN AMERICAN: 75 mL/min (ref >=60)
GFR, EST NON AFRICAN AMERICAN: 65 mL/min (ref >=60)
Glucose, Bld: 79 mg/dL (ref 65–99)
Potassium: 4 mmol/L (ref 3.5–5.3)
Sodium: 141 mmol/L (ref 135–146)
TOTAL PROTEIN: 5.8 g/dL — AB (ref 6.1–8.1)

## 2015-10-23 MED FILL — AMOXICILLIN 500 MG CAPSULE: 500 | 8 days supply | Qty: 30 | Fill #0

## 2015-10-23 MED FILL — BUPROPION HCL XL 150 MG TAB: 150 | 30 days supply | Qty: 90 | Fill #2

## 2015-10-23 NOTE — Addendum Note (Signed)
Addended by: Teddy Spike on: 10/23/2015 08:27 AM   Modules accepted: Orders

## 2015-10-24 NOTE — Telephone Encounter (Signed)
Patient is currently paying $9 for ambien. Step therapy required

## 2015-10-29 ENCOUNTER — Telehealth: Payer: Self-pay | Admitting: *Deleted

## 2015-10-29 MED FILL — traMADol HCL 50 MG TABS: 50 | 10 days supply | Qty: 60 | Fill #1

## 2015-10-29 NOTE — Telephone Encounter (Signed)
Pt informed.Robin Conrad Robin Conrad  

## 2015-10-29 NOTE — Telephone Encounter (Signed)
Checked on status of PA and the Lorrin Mais has been denied.  Deniedon August 7  CaseId:[id number];Product Name:ST High Risk Medications - SEDATIVE HYPNOTICS (Zaleplon, Zolpidem tartrate) - [employer] MEDICARE PDP;Status:Denied;Appeal Information: [place] [id number]. [phone] Phone:[phone] [phone] WebAddress:[email or web address];

## 2015-10-29 NOTE — Telephone Encounter (Signed)
Please notify patient.

## 2015-10-30 ENCOUNTER — Ambulatory Visit (INDEPENDENT_AMBULATORY_CARE_PROVIDER_SITE_OTHER): Payer: Medicare Other | Admitting: Licensed Clinical Social Worker

## 2015-10-30 DIAGNOSIS — J3089 Other allergic rhinitis: Secondary | ICD-10-CM | POA: Diagnosis not present

## 2015-10-30 DIAGNOSIS — J301 Allergic rhinitis due to pollen: Secondary | ICD-10-CM | POA: Diagnosis not present

## 2015-10-30 DIAGNOSIS — F419 Anxiety disorder, unspecified: Secondary | ICD-10-CM

## 2015-10-30 DIAGNOSIS — F3341 Major depressive disorder, recurrent, in partial remission: Secondary | ICD-10-CM

## 2015-10-30 DIAGNOSIS — J3081 Allergic rhinitis due to animal (cat) (dog) hair and dander: Secondary | ICD-10-CM | POA: Diagnosis not present

## 2015-11-11 DIAGNOSIS — M542 Cervicalgia: Secondary | ICD-10-CM | POA: Diagnosis not present

## 2015-11-11 DIAGNOSIS — M797 Fibromyalgia: Secondary | ICD-10-CM | POA: Diagnosis not present

## 2015-11-13 DIAGNOSIS — J3089 Other allergic rhinitis: Secondary | ICD-10-CM | POA: Diagnosis not present

## 2015-11-13 DIAGNOSIS — M797 Fibromyalgia: Secondary | ICD-10-CM | POA: Diagnosis not present

## 2015-11-13 DIAGNOSIS — J3081 Allergic rhinitis due to animal (cat) (dog) hair and dander: Secondary | ICD-10-CM | POA: Diagnosis not present

## 2015-11-13 DIAGNOSIS — M542 Cervicalgia: Secondary | ICD-10-CM | POA: Diagnosis not present

## 2015-11-13 DIAGNOSIS — J301 Allergic rhinitis due to pollen: Secondary | ICD-10-CM | POA: Diagnosis not present

## 2015-11-18 MED FILL — ZOLPIDEM TARTRATE 5 MG TAB: 5 | 30 days supply | Qty: 30 | Fill #3

## 2015-11-18 MED FILL — GABAPENTIN 600 MG TABLET: 600 | 30 days supply | Qty: 90 | Fill #1

## 2015-11-19 DIAGNOSIS — M542 Cervicalgia: Secondary | ICD-10-CM | POA: Diagnosis not present

## 2015-11-19 DIAGNOSIS — M797 Fibromyalgia: Secondary | ICD-10-CM | POA: Diagnosis not present

## 2015-11-20 ENCOUNTER — Encounter: Payer: Self-pay | Admitting: Family Medicine

## 2015-11-20 ENCOUNTER — Ambulatory Visit (INDEPENDENT_AMBULATORY_CARE_PROVIDER_SITE_OTHER): Payer: Medicare Other | Admitting: Family Medicine

## 2015-11-20 ENCOUNTER — Ambulatory Visit (INDEPENDENT_AMBULATORY_CARE_PROVIDER_SITE_OTHER): Payer: Medicare Other | Admitting: Licensed Clinical Social Worker

## 2015-11-20 VITALS — BP 121/66 | HR 87 | Wt 123.0 lb

## 2015-11-20 DIAGNOSIS — N952 Postmenopausal atrophic vaginitis: Secondary | ICD-10-CM | POA: Diagnosis not present

## 2015-11-20 DIAGNOSIS — D692 Other nonthrombocytopenic purpura: Secondary | ICD-10-CM

## 2015-11-20 DIAGNOSIS — M797 Fibromyalgia: Secondary | ICD-10-CM | POA: Diagnosis not present

## 2015-11-20 DIAGNOSIS — F419 Anxiety disorder, unspecified: Secondary | ICD-10-CM

## 2015-11-20 DIAGNOSIS — M79609 Pain in unspecified limb: Secondary | ICD-10-CM

## 2015-11-20 DIAGNOSIS — B351 Tinea unguium: Secondary | ICD-10-CM

## 2015-11-20 DIAGNOSIS — M5030 Other cervical disc degeneration, unspecified cervical region: Secondary | ICD-10-CM

## 2015-11-20 DIAGNOSIS — F3341 Major depressive disorder, recurrent, in partial remission: Secondary | ICD-10-CM

## 2015-11-20 MED ORDER — ESTRADIOL 0.1 MG/GM VA CREA: 1.0000 | TOPICAL_CREAM | Freq: Every day | VAGINAL | 99 refills | Status: DC

## 2015-11-20 MED ORDER — TERBINAFINE HCL 250 MG PO TABS: 250.0000 mg | ORAL_TABLET | Freq: Every day | ORAL | 0 refills | Status: DC

## 2015-11-20 MED FILL — ESTRACE 0.01% CREAM: 0.1 | 30 days supply | Qty: 43 | Fill #0

## 2015-11-20 MED FILL — TERBINAFINE HCL 250 MG TAB: 250 | 45 days supply | Qty: 45 | Fill #0

## 2015-11-20 NOTE — Progress Notes (Signed)
Subjective:    CC: Fibromyalgia    HPI:  Here for follow-up for fibromyalgia-she has been flaring for the last several weeks. She has been 3 PT sessions for her neck. She says she typically will feel sore the next day or later that day but does have another session coming up. She just has a lot of tightness especially when she turns her head to the left. She's had 2 prior cervical spine surgeries. She feels like her pain is been also exacerbated by lifting her granddaughter recently. 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  She still experiencing a lot of bruising. She did stop the fish oil but hasn't noticed a big difference.  She did check with her insurance and they will cover: Card. We will fax her form in today.  Hormone placement therapy-her insurance will now cover Estrace it so we will send a new prescription to the pharmacy.  She had her nails manicured about 2 months ago and says since then they have been breaking and she starting to get a fungus under several of her nails. It's a white discoloration.  Past medical history, Surgical history, Family history not pertinant except as noted below, Social history, Allergies, and medications have been entered into the medical record, reviewed, and corrections made.   Review of Systems: No fevers, chills, night sweats, weight loss, chest pain, or shortness of breath.   Objective:    General: Well Developed, well nourished, and in no acute distress.  Neuro: Alert and oriented x3, extra-ocular muscles intact, sensation grossly intact.  HEENT: Normocephalic, atraumatic  Skin: Warm and dry, no rashes. He does have several needles with thick whitened areas going underneath the nail on multiple fingers. Cardiac: Regular rate and rhythm, no murmurs rubs or gallops, no lower extremity edema.  Respiratory: Clear to auscultation bilaterally. Not using accessory muscles, speaking in full  sentences.   Impression and Recommendations:   Fibromyalgia -she is in the flare right now. Encouraged her to continue with regular stretching for her full body as well as heating pad. Continue with physical therapy.   Cervical degenerative disc disease-continue with PT, heating pad, and home stretches.  Atrophic vaginitis-we'll try sending over new prescription for the Estrace cream.  Senile purpura- Reassurance given.  Colon cancer screening- Cologuard form faxed.  Onychomycosis - will try oral lamisil for her fingernails.

## 2015-11-21 DIAGNOSIS — M542 Cervicalgia: Secondary | ICD-10-CM | POA: Diagnosis not present

## 2015-11-21 DIAGNOSIS — M797 Fibromyalgia: Secondary | ICD-10-CM | POA: Diagnosis not present

## 2015-11-26 ENCOUNTER — Other Ambulatory Visit: Payer: Self-pay | Admitting: Family Medicine

## 2015-11-26 DIAGNOSIS — M542 Cervicalgia: Secondary | ICD-10-CM | POA: Diagnosis not present

## 2015-11-26 DIAGNOSIS — M797 Fibromyalgia: Secondary | ICD-10-CM | POA: Diagnosis not present

## 2015-11-26 MED FILL — BUPROPION HCL XL 150 MG TAB: 150 | 30 days supply | Qty: 90 | Fill #3

## 2015-11-26 MED FILL — CYCLOBENZAPRINE 10 MG TAB: 10 | 30 days supply | Qty: 60 | Fill #0

## 2015-11-26 MED FILL — LEVOTHYROXINE 50 MCG TABLET: 50 | 30 days supply | Qty: 30 | Fill #5

## 2015-11-26 MED FILL — traMADol HCL 50 MG TABS: 50 | 10 days supply | Qty: 60 | Fill #2

## 2015-11-27 DIAGNOSIS — J301 Allergic rhinitis due to pollen: Secondary | ICD-10-CM | POA: Diagnosis not present

## 2015-11-27 DIAGNOSIS — J3089 Other allergic rhinitis: Secondary | ICD-10-CM | POA: Diagnosis not present

## 2015-11-27 DIAGNOSIS — J3081 Allergic rhinitis due to animal (cat) (dog) hair and dander: Secondary | ICD-10-CM | POA: Diagnosis not present

## 2015-11-28 DIAGNOSIS — M542 Cervicalgia: Secondary | ICD-10-CM | POA: Diagnosis not present

## 2015-11-28 DIAGNOSIS — M797 Fibromyalgia: Secondary | ICD-10-CM | POA: Diagnosis not present

## 2015-12-01 DIAGNOSIS — M542 Cervicalgia: Secondary | ICD-10-CM | POA: Diagnosis not present

## 2015-12-01 DIAGNOSIS — M797 Fibromyalgia: Secondary | ICD-10-CM | POA: Diagnosis not present

## 2015-12-04 DIAGNOSIS — M542 Cervicalgia: Secondary | ICD-10-CM | POA: Diagnosis not present

## 2015-12-04 DIAGNOSIS — M797 Fibromyalgia: Secondary | ICD-10-CM | POA: Diagnosis not present

## 2015-12-09 ENCOUNTER — Ambulatory Visit (INDEPENDENT_AMBULATORY_CARE_PROVIDER_SITE_OTHER): Payer: Medicare Other | Admitting: Sports Medicine

## 2015-12-09 ENCOUNTER — Encounter: Payer: Self-pay | Admitting: Sports Medicine

## 2015-12-09 DIAGNOSIS — M19042 Primary osteoarthritis, left hand: Principal | ICD-10-CM

## 2015-12-09 DIAGNOSIS — M19041 Primary osteoarthritis, right hand: Secondary | ICD-10-CM

## 2015-12-09 MED ORDER — CELECOXIB 200 MG PO CAPS: ORAL_CAPSULE | ORAL | 2 refills | Status: DC

## 2015-12-09 MED FILL — CELECOXIB 200 MG CAPSULE: 200 | 30 days supply | Qty: 60 | Fill #0

## 2015-12-09 NOTE — Progress Notes (Signed)
   Subjective:    I'm seeing this patient as a consultation for:  Dr. Beatrice Lecher  CC: Hand pain  HPI: Right hand pain: Swelling and pain at the right third MCP. Moderate, persistent, present for weeks. Not controlled with occasional ibuprofen.  Past medical history:  Negative.  See flowsheet/record as well for more information.  Surgical history: Negative.  See flowsheet/record as well for more information.  Family history: Negative.  See flowsheet/record as well for more information.  Social history: Negative.  See flowsheet/record as well for more information.  Allergies, and medications have been entered into the medical record, reviewed, and no changes needed.   Review of Systems: No headache, visual changes, nausea, vomiting, diarrhea, constipation, dizziness, abdominal pain, skin rash, fevers, chills, night sweats, weight loss, swollen lymph nodes, body aches, joint swelling, muscle aches, chest pain, shortness of breath, mood changes, visual or auditory hallucinations.   Objective:   General: Well Developed, well nourished, and in no acute distress.  Neuro/Psych: Alert and oriented x3, extra-ocular muscles intact, able to move all 4 extremities, sensation grossly intact. Skin: Warm and dry, no rashes noted.  Respiratory: Not using accessory muscles, speaking in full sentences, trachea midline.  Cardiovascular: Pulses palpable, no extremity edema. Abdomen: Does not appear distended. Right hand: Swelling with tenderness at the right third MCP, visible erythema overlying.  Procedure: Real-time Ultrasound Guided Injection of right third MCP Device: GE Logiq E  Verbal informed consent obtained.  Time-out conducted.  Noted no overlying erythema, induration, or other signs of local infection.  Skin prepped in a sterile fashion.  Local anesthesia: Topical Ethyl chloride.  With sterile technique and under real time ultrasound guidance:  1/2 mL kenalog 40, 1/2 mL lidocaine  injected easily. Completed without difficulty  Pain immediately resolved suggesting accurate placement of the medication.  Advised to call if fevers/chills, erythema, induration, drainage, or persistent bleeding.  Images permanently stored and available for review in the ultrasound unit.  Impression: Technically successful ultrasound guided injection.  Impression and Recommendations:   This case required medical decision making of moderate complexity.  Primary osteoarthritis of both hands Right third metacarpophalangeal joint injection as above. Switching to Celebrex. Return to see me in one month.

## 2015-12-09 NOTE — Assessment & Plan Note (Signed)
Right third metacarpophalangeal joint injection as above. Switching to Celebrex. Return to see me in one month.

## 2015-12-15 MED FILL — ZOLPIDEM TARTRATE 5 MG TAB: 5 | 30 days supply | Qty: 30 | Fill #4

## 2015-12-15 MED FILL — GABAPENTIN 600 MG TABLET: 600 | 30 days supply | Qty: 90 | Fill #2

## 2015-12-24 DIAGNOSIS — J3089 Other allergic rhinitis: Secondary | ICD-10-CM | POA: Diagnosis not present

## 2015-12-24 DIAGNOSIS — J301 Allergic rhinitis due to pollen: Secondary | ICD-10-CM | POA: Diagnosis not present

## 2015-12-24 DIAGNOSIS — J3081 Allergic rhinitis due to animal (cat) (dog) hair and dander: Secondary | ICD-10-CM | POA: Diagnosis not present

## 2015-12-25 ENCOUNTER — Ambulatory Visit (INDEPENDENT_AMBULATORY_CARE_PROVIDER_SITE_OTHER): Payer: Medicare Other | Admitting: Licensed Clinical Social Worker

## 2015-12-25 DIAGNOSIS — F419 Anxiety disorder, unspecified: Secondary | ICD-10-CM

## 2015-12-25 DIAGNOSIS — F3341 Major depressive disorder, recurrent, in partial remission: Secondary | ICD-10-CM

## 2015-12-26 ENCOUNTER — Other Ambulatory Visit: Payer: Self-pay | Admitting: Family Medicine

## 2015-12-26 MED FILL — LEVOTHYROXINE 50 MCG TABLET: 50 | 90 days supply | Qty: 90 | Fill #0

## 2015-12-26 MED FILL — BUPROPION HCL XL 150 MG TAB: 150 | 30 days supply | Qty: 90 | Fill #4

## 2015-12-26 MED FILL — traMADol HCL 50 MG TABS: 50 | 10 days supply | Qty: 60 | Fill #3

## 2015-12-26 MED FILL — CYCLOBENZAPRINE 10 MG TAB: 10 | 30 days supply | Qty: 60 | Fill #1

## 2016-01-01 DIAGNOSIS — N3941 Urge incontinence: Secondary | ICD-10-CM | POA: Diagnosis not present

## 2016-01-06 ENCOUNTER — Encounter: Payer: Self-pay | Admitting: Sports Medicine

## 2016-01-06 ENCOUNTER — Ambulatory Visit (INDEPENDENT_AMBULATORY_CARE_PROVIDER_SITE_OTHER): Payer: Medicare Other | Admitting: Sports Medicine

## 2016-01-06 DIAGNOSIS — M19041 Primary osteoarthritis, right hand: Secondary | ICD-10-CM

## 2016-01-06 DIAGNOSIS — M19042 Primary osteoarthritis, left hand: Secondary | ICD-10-CM

## 2016-01-06 DIAGNOSIS — M5030 Other cervical disc degeneration, unspecified cervical region: Secondary | ICD-10-CM | POA: Diagnosis not present

## 2016-01-06 MED ORDER — DICLOFENAC SODIUM 2 % TD SOLN: 2.0000 | Freq: Two times a day (BID) | TRANSDERMAL | 11 refills | Status: DC

## 2016-01-06 NOTE — Assessment & Plan Note (Signed)
Persistent pain despite physical therapy, soft collar. She cannot have an MRI due to a bladder stimulator. We are going to proceed with CT myelogram in anticipation of an epidural. Pain is upper cervical and radiates to the left trapezius

## 2016-01-06 NOTE — Assessment & Plan Note (Signed)
Doing well after right third MCP injection at the last visit, increasing Celebrex to 2 pills daily and adding topical Pennsaid.

## 2016-01-06 NOTE — Progress Notes (Signed)
  Subjective:    CC:  Follow-up  HPI: Right hand osteoarthritis: Significant improvement after third metacarpal phalangeal joint injection at the last visit.  Neck pain: With left upper cervical radicular symptoms into the left trapezius, she is post-cervical disc surgery. Unable to have an MRI due to bladder implant.  Past medical history:  Negative.  See flowsheet/record as well for more information.  Surgical history: Negative.  See flowsheet/record as well for more information.  Family history: Negative.  See flowsheet/record as well for more information.  Social history: Negative.  See flowsheet/record as well for more information.  Allergies, and medications have been entered into the medical record, reviewed, and no changes needed.   Review of Systems: No fevers, chills, night sweats, weight loss, chest pain, or shortness of breath.   Objective:    General: Well Developed, well nourished, and in no acute distress.  Neuro: Alert and oriented x3, extra-ocular muscles intact, sensation grossly intact.  HEENT: Normocephalic, atraumatic, pupils equal round reactive to light, neck supple, no masses, no lymphadenopathy, thyroid nonpalpable.  Skin: Warm and dry, no rashes. Cardiac: Regular rate and rhythm, no murmurs rubs or gallops, no lower extremity edema.  Respiratory: Clear to auscultation bilaterally. Not using accessory muscles, speaking in full sentences.  Impression and Recommendations:    DDD (degenerative disc disease), cervical Persistent pain despite physical therapy, soft collar. She cannot have an MRI due to a bladder stimulator. We are going to proceed with CT myelogram in anticipation of an epidural. Pain is upper cervical and radiates to the left trapezius  Primary osteoarthritis of both hands Doing well after right third MCP injection at the last visit, increasing Celebrex to 2 pills daily and adding topical Pennsaid.  I spent 25 minutes with this patient,  greater than 50% was face-to-face time counseling regarding the above diagnoses

## 2016-01-07 DIAGNOSIS — J3089 Other allergic rhinitis: Secondary | ICD-10-CM | POA: Diagnosis not present

## 2016-01-07 DIAGNOSIS — J301 Allergic rhinitis due to pollen: Secondary | ICD-10-CM | POA: Diagnosis not present

## 2016-01-07 DIAGNOSIS — J3081 Allergic rhinitis due to animal (cat) (dog) hair and dander: Secondary | ICD-10-CM | POA: Diagnosis not present

## 2016-01-15 ENCOUNTER — Ambulatory Visit (INDEPENDENT_AMBULATORY_CARE_PROVIDER_SITE_OTHER): Payer: Medicare Other | Admitting: Licensed Clinical Social Worker

## 2016-01-15 DIAGNOSIS — F419 Anxiety disorder, unspecified: Secondary | ICD-10-CM | POA: Diagnosis not present

## 2016-01-15 DIAGNOSIS — F3341 Major depressive disorder, recurrent, in partial remission: Secondary | ICD-10-CM | POA: Diagnosis not present

## 2016-01-15 MED FILL — ZOLPIDEM TARTRATE 5 MG TAB: 5 | 30 days supply | Qty: 30 | Fill #5

## 2016-01-15 MED FILL — GABAPENTIN 600 MG TABLET: 600 | 30 days supply | Qty: 90 | Fill #3

## 2016-01-20 ENCOUNTER — Other Ambulatory Visit: Payer: Self-pay | Admitting: Sports Medicine

## 2016-01-20 ENCOUNTER — Ambulatory Visit (INDEPENDENT_AMBULATORY_CARE_PROVIDER_SITE_OTHER): Payer: Medicare Other | Admitting: Family Medicine

## 2016-01-20 ENCOUNTER — Encounter: Payer: Self-pay | Admitting: Family Medicine

## 2016-01-20 ENCOUNTER — Telehealth: Payer: Self-pay | Admitting: Family Medicine

## 2016-01-20 VITALS — BP 109/48 | HR 95 | Wt 122.0 lb

## 2016-01-20 DIAGNOSIS — M797 Fibromyalgia: Secondary | ICD-10-CM | POA: Diagnosis not present

## 2016-01-20 DIAGNOSIS — M5030 Other cervical disc degeneration, unspecified cervical region: Secondary | ICD-10-CM | POA: Diagnosis not present

## 2016-01-20 DIAGNOSIS — B351 Tinea unguium: Secondary | ICD-10-CM

## 2016-01-20 DIAGNOSIS — M792 Neuralgia and neuritis, unspecified: Secondary | ICD-10-CM | POA: Diagnosis not present

## 2016-01-20 MED ORDER — GABAPENTIN 600 MG PO TABS: ORAL_TABLET | ORAL | 3 refills | Status: DC

## 2016-01-20 NOTE — Progress Notes (Signed)
Subjective:    CC: Fibromyalgia  HPI: Two-month follow-up for fibromyalgia-when I last saw her she been having several flares over short period of time. She had gone to 3 PT sessions for her neck.  Onychomycosis-we also decided that her on oral Lamisil for her fingernails. She started to get a fungus after having her nails manicured.  Cervical pain with radiculopathy-she did complete 8 physical therapy sessions. She then followed up with Dr. Dianah Field who recommended CT myelogram of the cervical spine. This was a couple of weeks ago. She said she hasn't heard back yet. She said she also be potentially interested in dry needling as an option. She said she started some friends who have done that and have  Asthma - she is in a research study.  Had her follow up earlier this month. She is not on a controller currently.   And they were doing some testing at her visit she says afterwards it did flare her asthma for the rest of the day. But she's been doing well since then.  Past medical history, Surgical history, Family history not pertinant except as noted below, Social history, Allergies, and medications have been entered into the medical record, reviewed, and corrections made.   Review of Systems: No fevers, chills, night sweats, weight loss, chest pain, or shortness of breath.   Objective:    General: Well Developed, well nourished, and in no acute distress.  Neuro: Alert and oriented x3, extra-ocular muscles intact, sensation grossly intact.  HEENT: Normocephalic, atraumatic  Skin: Warm and dry, no rashes. Cardiac: Regular rate and rhythm, no murmurs rubs or gallops, no lower extremity edema.  Respiratory: Clear to auscultation bilaterally. Not using accessory muscles, speaking in full sentences.   Impression and Recommendations:   Fibromyalgia-all she is doing okay. She is having a lot of pain in her joints in her hands. Checks his our sports medicine doctor this recently and had an  injection in the right hand. She does feel like that's been helpful. She still occasionally gets a tingling and burning sensation in her right flank rib area. She says sometimes she would like to take an extra gabapentin the middle of the day. She currently takes 1 in the morning and 2 at bedtime. Will rewrite her prescription so she can take an extra tab if needed.  Onychomycosis-much improved. She is complete resolution on one finger and partial resolution on the other.  Cervical radiculopathy - will check on myelogram that was ordered since she hasn't been contacted yet. Will check to see if dry needling would be covered.    Asthma - just using PRN albuterol.

## 2016-01-20 NOTE — Telephone Encounter (Signed)
Patient came in today for office visit and said that she had not heard back yet about the myelogram that was supposed to be scheduled for her cervical spine. She says it has been almost a month. Didn't know if we were still working on this or not?

## 2016-01-20 NOTE — Telephone Encounter (Signed)
Taken care of

## 2016-01-22 MED FILL — BUPROPION HCL XL 150 MG TAB: 150 | 30 days supply | Qty: 90 | Fill #5

## 2016-01-22 MED FILL — traMADol HCL 50 MG TABS: 50 | 10 days supply | Qty: 60 | Fill #4

## 2016-01-26 DIAGNOSIS — N3941 Urge incontinence: Secondary | ICD-10-CM | POA: Diagnosis not present

## 2016-01-27 ENCOUNTER — Other Ambulatory Visit: Payer: Self-pay | Admitting: Family Medicine

## 2016-01-27 MED FILL — CELECOXIB 200 MG CAPSULE: 200 | 30 days supply | Qty: 60 | Fill #1

## 2016-01-28 MED FILL — CYCLOBENZAPRINE 10 MG TAB: 10 | 30 days supply | Qty: 60 | Fill #0

## 2016-01-29 ENCOUNTER — Ambulatory Visit
Admission: RE | Admit: 2016-01-29 | Discharge: 2016-01-29 | Disposition: A | Discharge status: Home / Self Care | Payer: Medicare Other | Source: Ambulatory Visit | Attending: Sports Medicine | Admitting: Sports Medicine

## 2016-01-29 VITALS — BP 131/69 | HR 101

## 2016-01-29 DIAGNOSIS — M5030 Other cervical disc degeneration, unspecified cervical region: Secondary | ICD-10-CM

## 2016-01-29 DIAGNOSIS — M4802 Spinal stenosis, cervical region: Secondary | ICD-10-CM | POA: Diagnosis not present

## 2016-01-29 IMAGING — XA DG MYELOGRAPHY LUMBAR INJ CERVICAL
10 series · 10 of 10 positions shown · non-contrast
Comparison: none

CLINICAL DATA: Cervical spondylosis without myelopathy. LEFT upper
cervical radicular symptoms into the trapezius.
TECHNIQUE: Contiguous axial images were obtained through the Cervical spine
after the intrathecal infusion of infusion. Coronal and sagittal
reconstructions were obtained of the axial image sets.

[Series 1: ortho standard · 1 of 1 slices shown (1 of 10)]
[im 1/1]
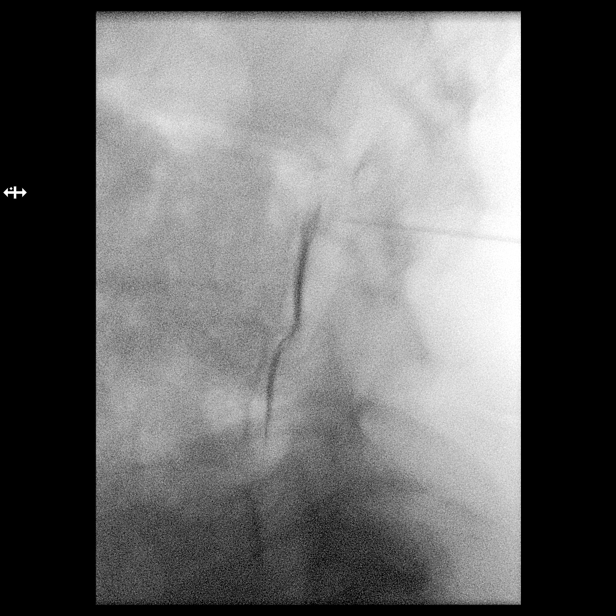

[Series 2: ortho standard · 1 of 1 slices shown (2 of 10)]
[im 1/1]
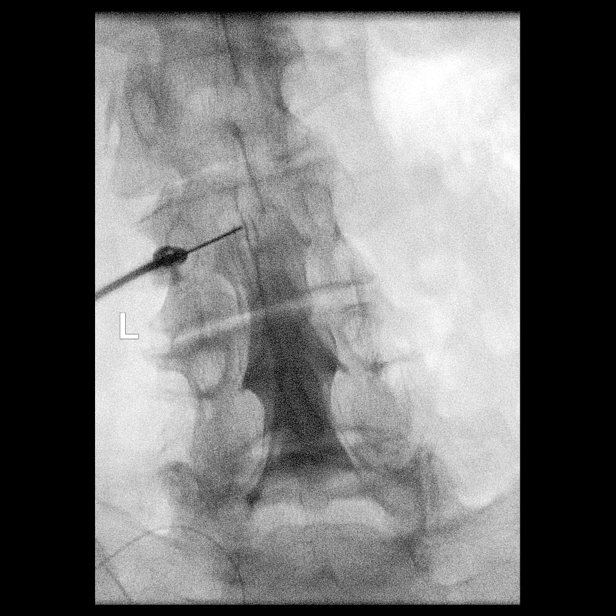

[Series 3: ortho standard · 1 of 1 slices shown (3 of 10)]
[im 1/1]
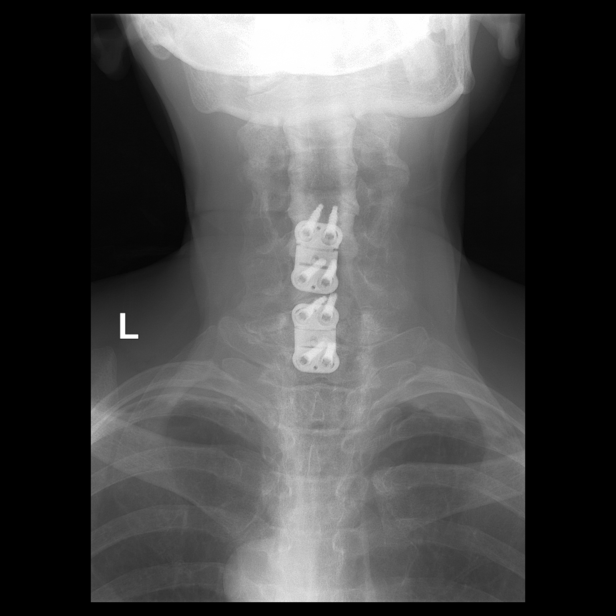

[Series 4: ortho standard · 1 of 1 slices shown (4 of 10)]
[im 1/1]
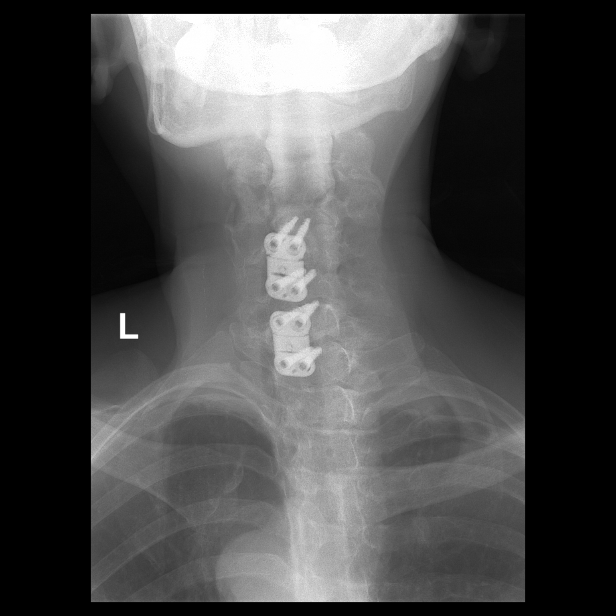

[Series 5: ortho standard · 1 of 1 slices shown (5 of 10)]
[im 1/1]
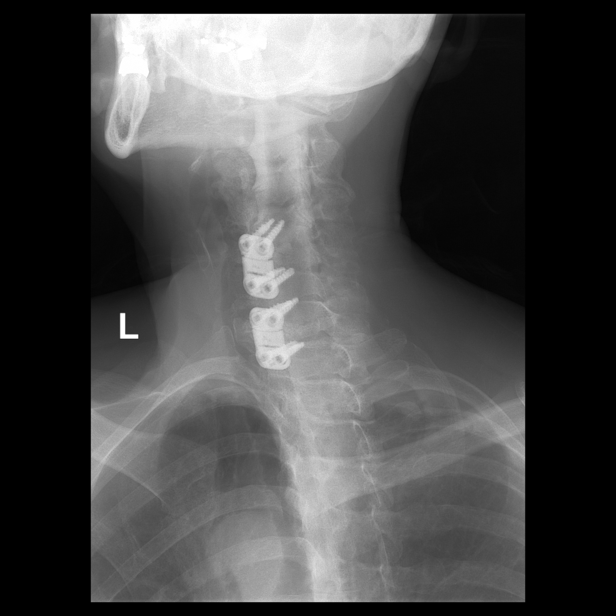

[Series 6: ortho standard · 1 of 1 slices shown (6 of 10)]
[im 1/1]
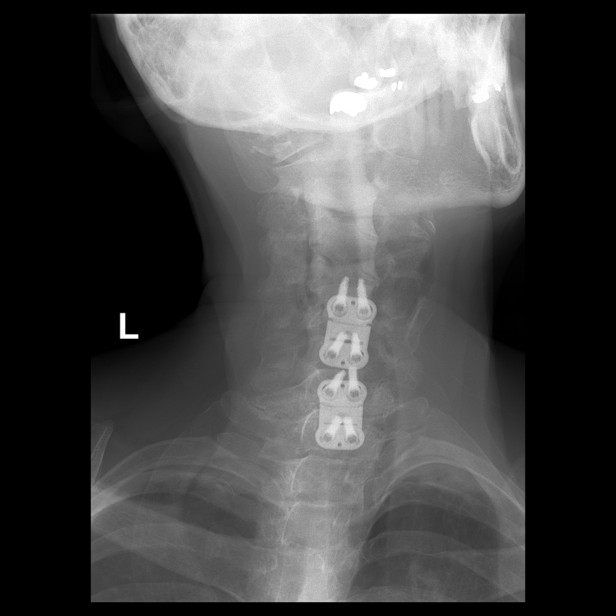

[Series 7: ortho standard · 1 of 1 slices shown (7 of 10)]
[im 1/1]
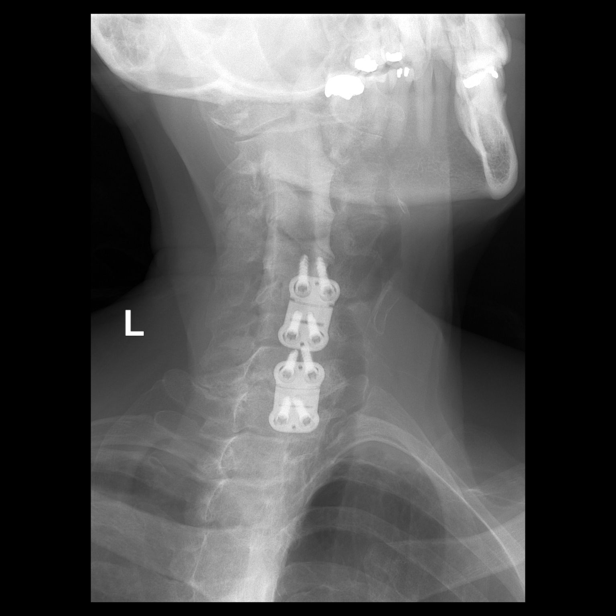

[Series 8: ortho standard · 1 of 1 slices shown (8 of 10)]
[im 1/1]
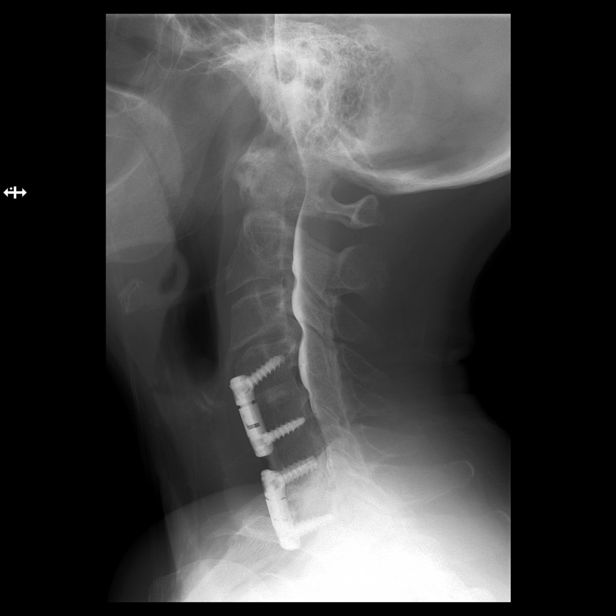

[Series 9: ortho standard · 1 of 1 slices shown (9 of 10)]
[im 1/1]
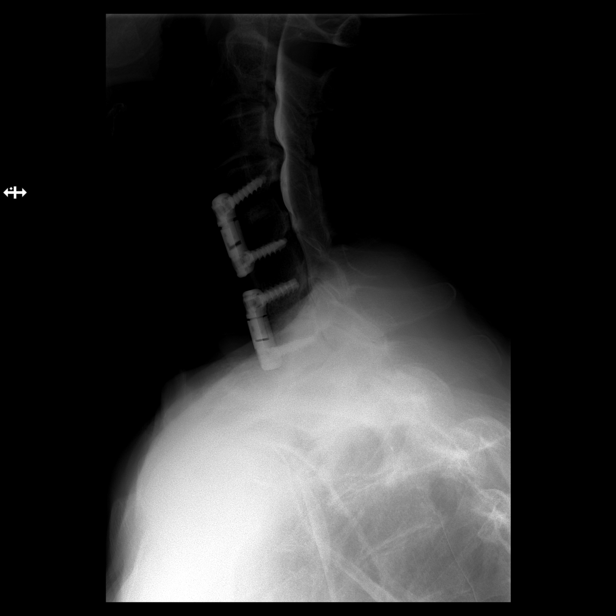

[Series 10: ortho standard · 1 of 1 slices shown (10 of 10)]
[im 1/1]
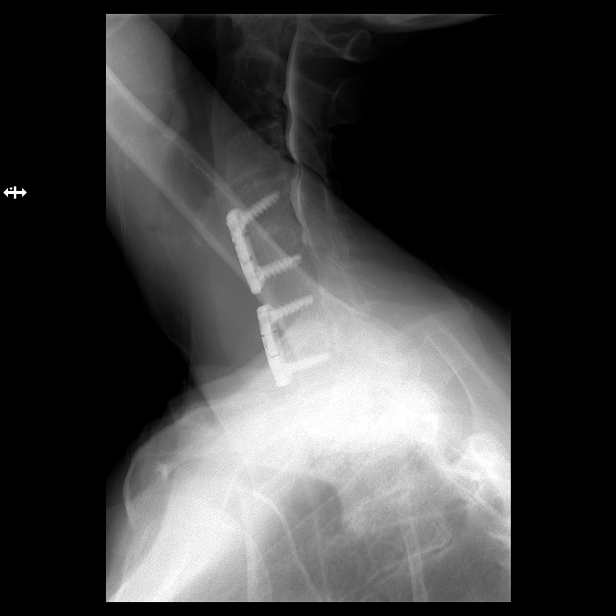

[10 of 10 positions shown; findings below may reference images not displayed]

FLUOROSCOPY TIME:  34 seconds corresponding to a Dose Area Product
of 78.44 ?Gy*m2

PROCEDURE:
LUMBAR PUNCTURE FOR CERVICAL MYELOGRAM

After thorough discussion of risks and benefits of the procedure
including bleeding, infection, injury to nerves, blood vessels,
adjacent structures as well as headache and CSF leak, written and
oral informed consent was obtained. Consent was obtained by Dr. QUIRIJN
QUIRIJN. We discussed the high likelihood of obtaining a diagnostic
study.

Patient was positioned prone on the fluoroscopy table. Local
anesthesia was provided with 1% lidocaine without epinephrine after
prepped and draped in the usual sterile fashion. Puncture was
performed at L2-L3 using a 3 1/2 inch 22-gauge spinal needle via
LEFT paramedian approach. Using a single pass through the dura, the
needle was placed within the thecal sac, with return of clear CSF.
10 mL of Isovue M 300 was injected into the thecal sac, with normal
opacification of the nerve roots and cauda equina consistent with
free flow within the subarachnoid space. The patient was then moved
to the trendelenburg position and contrast flowed into the Cervical
spine region.

I personally performed the lumbar puncture and administered the
intrathecal contrast. I also personally supervised acquisition of
the myelogram images.
FINDINGS: CERVICAL MYELOGRAM FINDINGS:

Good opacification cervical subarachnoid space. The patient has
undergone previous C4-5, C5-6, and C6-7 ACDF. C5-6 fusion was
performed initially, is solid, with removal of the plate. Subsequent
ACDF at C4-5 with anterior plating is solid. Subsequent C6-7 ACDF
with plating. Screws and plate intact but incomplete interbody
arthrodesis.

Anatomic alignment. Moderate-sized ventral defect at C3-4 with mild
stenosis. No cord flattening.

Within limits for assessment of the foramina due to overlying
hardware, no visible extradural defect.

CT CERVICAL MYELOGRAM FINDINGS:

Alignment: Straightening of the normal cervical lordosis. Trace
anterolisthesis C7-T1 of 1 and 2 mm.

Vertebrae: No worrisome osseous lesion. Endplate sclerotic changes
at C2-3 and C3-4. Pseudarthrosis at C6-7 with sclerosis of the
endplates more prominently.

Cord: Mild mass effect on the cord at C3-4 without significant
stenosis.

Posterior Fossa: No tonsillar herniation.

Vertebral Arteries: Not assessed.

Paraspinal tissues: Unremarkable. Biapical pleural thickening, worse
on the RIGHT similar to [UU] chest CT.

Disc levels:

C2-3: Calcified central protrusion. Effacement anterior subarachnoid
space. BILATERAL facet arthropathy, worse on the LEFT. No
impingement.

C3-4: Advanced disc space narrowing. Osseous ridging. BILATERAL
facet arthropathy, worse on the LEFT. Broad-based central
protrusion. Mild BILATERAL uncinate spurring. Mild stenosis. No
definite nerve root cut off.

C4-5:  Solid arthrodesis.  No residual impingement.

C5-6:  Solid arthrodesis.  No residual impingement.

C6-7: Pseudarthrosis. Osseous ridging. No solid interbody or
posterior element arthrodesis. No foraminal narrowing or significant
stenosis.

C7-T1: Disc space narrowing. Central protrusion. Severe LEFT and
mild RIGHT facet arthropathy. No definite C8 nerve root impingement.
IMPRESSION: Solid C4-5 and C5-6 arthrodesis.

Pseudarthrosis at C6-7. Hardware intact without residual
impingement.

Moderate to severe asymmetric facet arthropathy on the LEFT at C2-3,
C3-4, and/or C7-T1 could contribute to reported symptoms. Correlate
clinically for facet mediated cervicalgia.

Central protrusions at C2-3 and C3-4, with mild stenosis at the C3-4
level, uncertain significance.

## 2016-01-29 IMAGING — CT CT CERVICAL SPINE W/ CM
2 series · 10 of 14 positions shown, 12 images · non-contrast
Comparison: none

CLINICAL DATA: Cervical spondylosis without myelopathy. LEFT upper
cervical radicular symptoms into the trapezius.
TECHNIQUE: Contiguous axial images were obtained through the Cervical spine
after the intrathecal infusion of infusion. Coronal and sagittal
reconstructions were obtained of the axial image sets.

[Series 3: cspine soft · axial · 0.32mm/px · z∈[-565,-425]mm · 5 of 106 slices shown]
[im 18/106  soft-tissue]
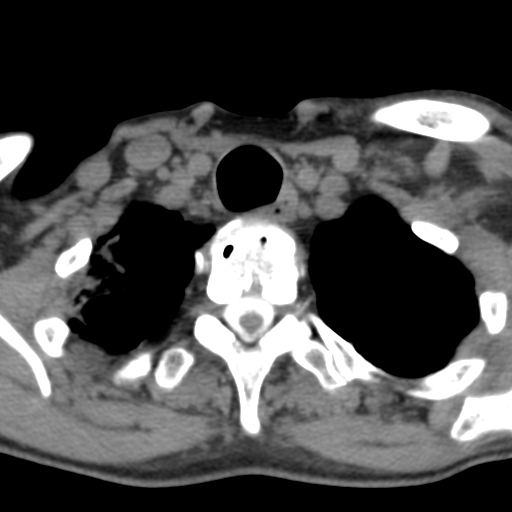
[im 36/106  soft-tissue]
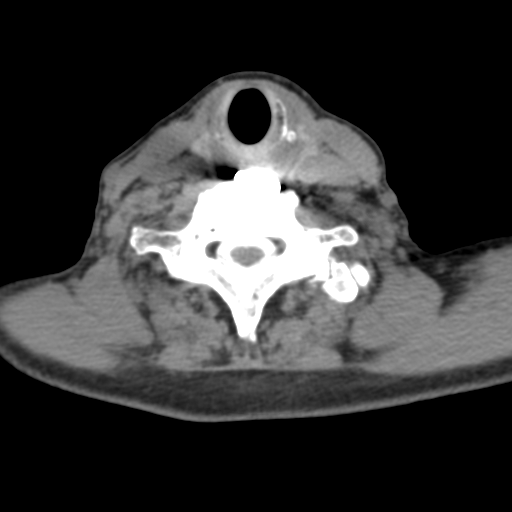
[im 53/106  soft-tissue]
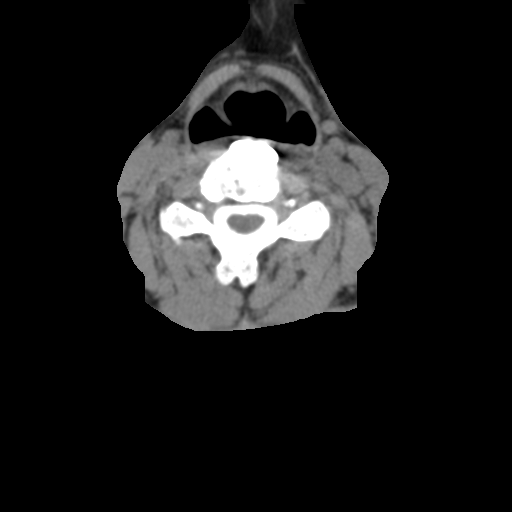
[im 71/106  soft-tissue]
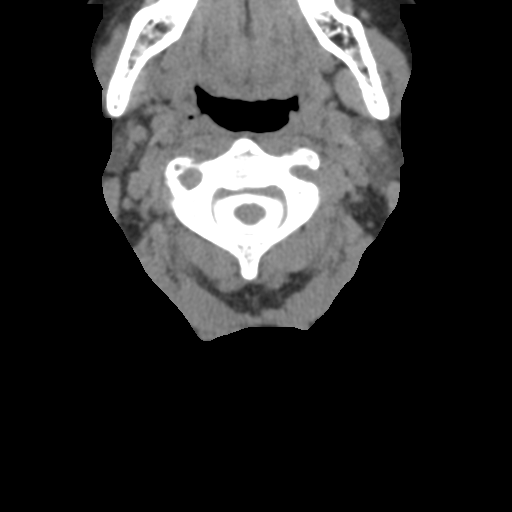
[im 88/106  soft-tissue]
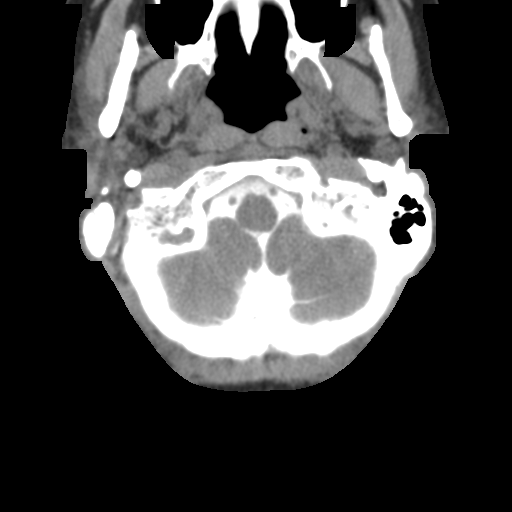

[Series 10: angled axial · axial · 0.29mm/px · z∈[-598,-449]mm · 5 of 119 slices shown, 7 images]
[im 20/119  soft-tissue]
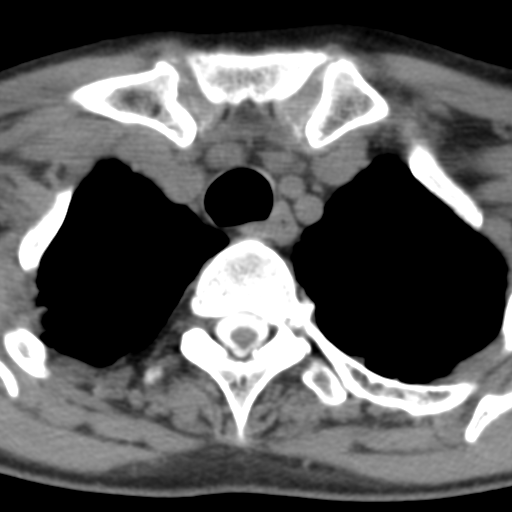
[im 20/119  bone]
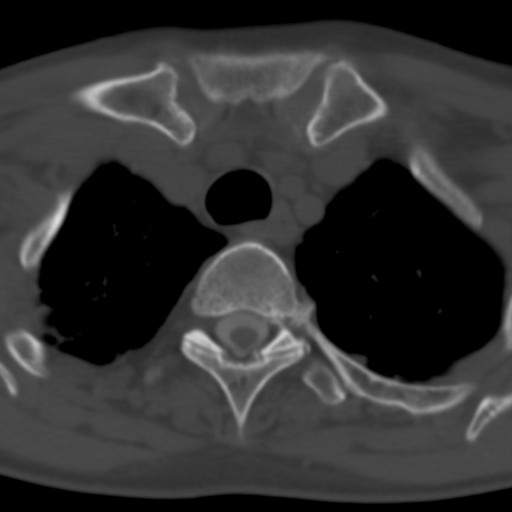
[im 40/119  bone]
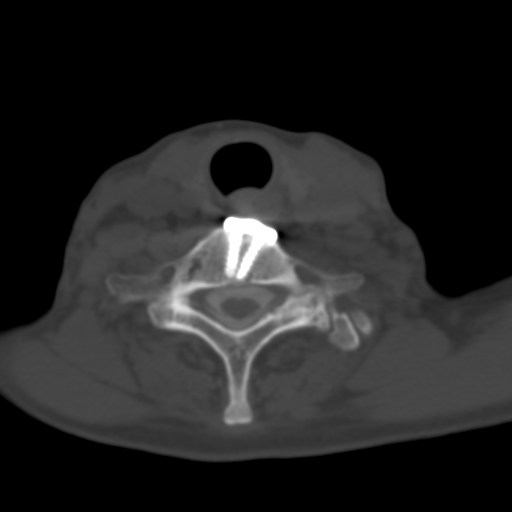
[im 60/119  bone]
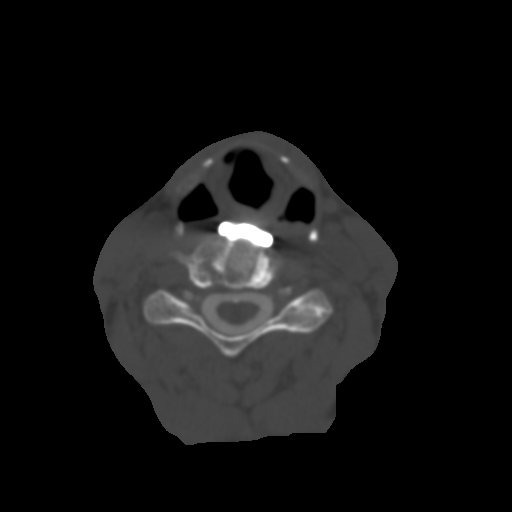
[im 79/119  bone]
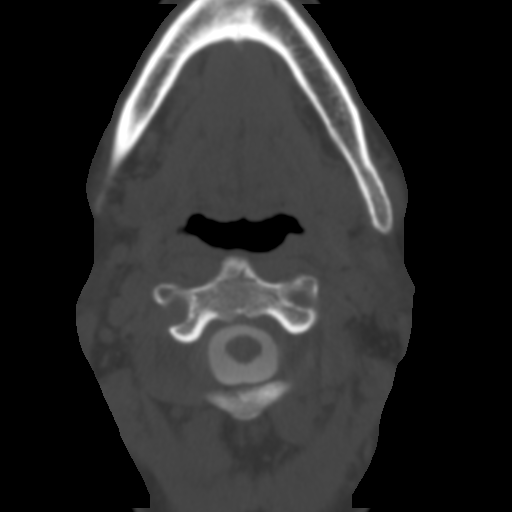
[im 99/119  soft-tissue]
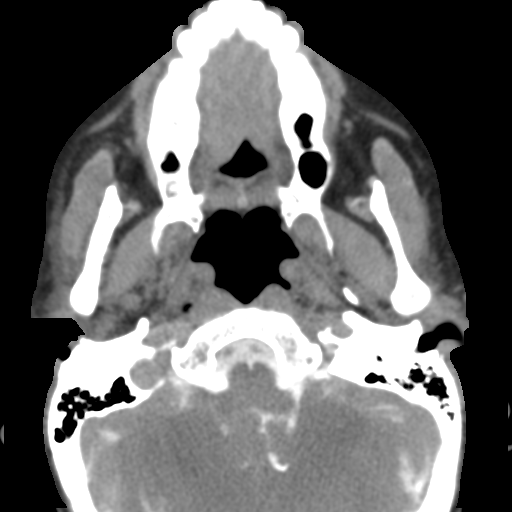
[im 99/119  bone]
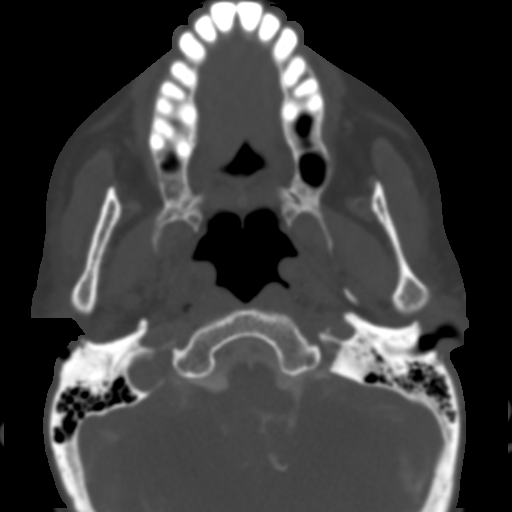

[10 of 14 positions shown; findings below may reference images not displayed]

FLUOROSCOPY TIME:  34 seconds corresponding to a Dose Area Product
of 78.44 ?Gy*m2

PROCEDURE:
LUMBAR PUNCTURE FOR CERVICAL MYELOGRAM

After thorough discussion of risks and benefits of the procedure
including bleeding, infection, injury to nerves, blood vessels,
adjacent structures as well as headache and CSF leak, written and
oral informed consent was obtained. Consent was obtained by Dr. QUIRIJN
QUIRIJN. We discussed the high likelihood of obtaining a diagnostic
study.

Patient was positioned prone on the fluoroscopy table. Local
anesthesia was provided with 1% lidocaine without epinephrine after
prepped and draped in the usual sterile fashion. Puncture was
performed at L2-L3 using a 3 1/2 inch 22-gauge spinal needle via
LEFT paramedian approach. Using a single pass through the dura, the
needle was placed within the thecal sac, with return of clear CSF.
10 mL of Isovue M 300 was injected into the thecal sac, with normal
opacification of the nerve roots and cauda equina consistent with
free flow within the subarachnoid space. The patient was then moved
to the trendelenburg position and contrast flowed into the Cervical
spine region.

I personally performed the lumbar puncture and administered the
intrathecal contrast. I also personally supervised acquisition of
the myelogram images.
FINDINGS: CERVICAL MYELOGRAM FINDINGS:

Good opacification cervical subarachnoid space. The patient has
undergone previous C4-5, C5-6, and C6-7 ACDF. C5-6 fusion was
performed initially, is solid, with removal of the plate. Subsequent
ACDF at C4-5 with anterior plating is solid. Subsequent C6-7 ACDF
with plating. Screws and plate intact but incomplete interbody
arthrodesis.

Anatomic alignment. Moderate-sized ventral defect at C3-4 with mild
stenosis. No cord flattening.

Within limits for assessment of the foramina due to overlying
hardware, no visible extradural defect.

CT CERVICAL MYELOGRAM FINDINGS:

Alignment: Straightening of the normal cervical lordosis. Trace
anterolisthesis C7-T1 of 1 and 2 mm.

Vertebrae: No worrisome osseous lesion. Endplate sclerotic changes
at C2-3 and C3-4. Pseudarthrosis at C6-7 with sclerosis of the
endplates more prominently.

Cord: Mild mass effect on the cord at C3-4 without significant
stenosis.

Posterior Fossa: No tonsillar herniation.

Vertebral Arteries: Not assessed.

Paraspinal tissues: Unremarkable. Biapical pleural thickening, worse
on the RIGHT similar to [UU] chest CT.

Disc levels:

C2-3: Calcified central protrusion. Effacement anterior subarachnoid
space. BILATERAL facet arthropathy, worse on the LEFT. No
impingement.

C3-4: Advanced disc space narrowing. Osseous ridging. BILATERAL
facet arthropathy, worse on the LEFT. Broad-based central
protrusion. Mild BILATERAL uncinate spurring. Mild stenosis. No
definite nerve root cut off.

C4-5:  Solid arthrodesis.  No residual impingement.

C5-6:  Solid arthrodesis.  No residual impingement.

C6-7: Pseudarthrosis. Osseous ridging. No solid interbody or
posterior element arthrodesis. No foraminal narrowing or significant
stenosis.

C7-T1: Disc space narrowing. Central protrusion. Severe LEFT and
mild RIGHT facet arthropathy. No definite C8 nerve root impingement.
IMPRESSION: Solid C4-5 and C5-6 arthrodesis.

Pseudarthrosis at C6-7. Hardware intact without residual
impingement.

Moderate to severe asymmetric facet arthropathy on the LEFT at C2-3,
C3-4, and/or C7-T1 could contribute to reported symptoms. Correlate
clinically for facet mediated cervicalgia.

Central protrusions at C2-3 and C3-4, with mild stenosis at the C3-4
level, uncertain significance.

## 2016-01-29 MED ORDER — DIAZEPAM 5 MG PO TABS
5.0000 mg | ORAL_TABLET | Freq: Once | ORAL | Status: AC
  Administered 2016-01-29: 5 mg via ORAL

## 2016-01-29 MED ORDER — IBUPROFEN 400 MG PO TABS
400.0000 mg | ORAL_TABLET | Freq: Once | ORAL | Status: AC
  Administered 2016-01-29: 400 mg via ORAL

## 2016-01-29 MED ORDER — IOPAMIDOL (ISOVUE-M 300) INJECTION 61%
10.0000 mL | Freq: Once | INTRAMUSCULAR | Status: AC | PRN
  Administered 2016-01-29: 10 mL via INTRATHECAL

## 2016-01-29 NOTE — Discharge Instructions (Signed)
Myelogram Discharge Instructions  1. Go home and rest quietly for the next 24 hours.  It is important to lie flat for the next 24 hours.  Get up only to go to the restroom.  You may lie in the bed or on a couch on your back, your stomach, your left side or your right side.  You may have one pillow under your head.  You may have pillows between your knees while you are on your side or under your knees while you are on your back.  2. DO NOT drive today.  Recline the seat as far back as it will go, while still wearing your seat belt, on the way home.  3. You may get up to go to the bathroom as needed.  You may sit up for 10 minutes to eat.  You may resume your normal diet and medications unless otherwise indicated.  Drink lots of extra fluids today and tomorrow.  4. The incidence of headache, nausea, or vomiting is about 5% (one in 20 patients).  If you develop a headache, lie flat and drink plenty of fluids until the headache goes away.  Caffeinated beverages may be helpful.  If you develop severe nausea and vomiting or a headache that does not go away with flat bed rest, call [phone].  5. You may resume normal activities after your 24 hours of bed rest is over; however, do not exert yourself strongly or do any heavy lifting tomorrow. If when you get up you have a headache when standing, go back to bed and force fluids for another 24 hours.  6. Call your physician for a follow-up appointment.  The results of your myelogram will be sent directly to your physician by the following day.  7. If you have any questions or if complications develop after you arrive home, please call [phone].  Discharge instructions have been explained to the patient.  The patient, or the person responsible for the patient, fully understands these instructions.       May resume Wellbutrin and Tramadol on Nov. 17, 2017, after 10:30 am.

## 2016-02-02 ENCOUNTER — Telehealth: Payer: Self-pay

## 2016-02-02 ENCOUNTER — Telehealth: Payer: Self-pay | Admitting: Radiology

## 2016-02-02 DIAGNOSIS — G9600 Cerebrospinal fluid leak, unspecified: Secondary | ICD-10-CM

## 2016-02-02 NOTE — Telephone Encounter (Signed)
Pt called [id number] headache. Explained blood patch and bedrest options. Pt doesn't think she can get here for blood patch. Will get order in case she changes her mind.

## 2016-02-02 NOTE — Telephone Encounter (Signed)
[facility] called and left a message asking for an order for a blood patch. Please advise.

## 2016-02-02 NOTE — Telephone Encounter (Signed)
How is that order placed, I don't see a procedure or imaging order for epidural blood patch.

## 2016-02-03 ENCOUNTER — Other Ambulatory Visit: Payer: Self-pay | Admitting: Sports Medicine

## 2016-02-03 ENCOUNTER — Ambulatory Visit
Admission: RE | Admit: 2016-02-03 | Discharge: 2016-02-03 | Disposition: A | Discharge status: Home / Self Care | Payer: Medicare Other | Source: Ambulatory Visit | Attending: Sports Medicine | Admitting: Sports Medicine

## 2016-02-03 DIAGNOSIS — G9600 Cerebrospinal fluid leak, unspecified: Secondary | ICD-10-CM

## 2016-02-03 DIAGNOSIS — G971 Other reaction to spinal and lumbar puncture: Secondary | ICD-10-CM | POA: Diagnosis not present

## 2016-02-03 IMAGING — XA DG EPIDUROGRAM S+I
2 series · 2 of 2 positions shown · IV contrast (isovue)
Comparison: Myelogram [DATE]

CLINICAL DATA: Spinal headache after cervical myelogram [DATE].

EXAM:
LUMBAR EPIDUROGRAM AND BLOOD PATCH
TECHNIQUE: The procedure, risks (including but not limited to bleeding,
infection, organ damage ), benefits, and alternatives were explained
to the patient. Questions regarding the procedure were encouraged
and answered. The patient understands and consents to the procedure.
The skin entry site from previous lumbar puncture was localized
corresponding to the space between the left L2 and L3 spinous
processes. An appropriate skin entry site was determined. Operator
donned sterile gloves and mask. Skin site was marked, prepped with
Betadine, and draped in usual sterile fashion, and infiltrated
locally with 1% lidocaine. The 20 gauge Crawford needle was advanced
into the lumbar posterior epidural space near the midline using a
left interlaminar approach at L2-[OT] loss of resistance
technique. Diagnostic injection of 2ml Isovue-M 200 demonstrates
good epidural spread above and below the needle tip and crossing the
midline. No intravascular or subarachnoid component. 20ml of
autologous blood were then administered as lumbar epidural blood
patch. No immediate complication.
FLUOROSCOPY TIME:  18 seconds, 38 [OT] DAP

[Series 1: ortho standard · 1 of 1 slices shown (1 of 2)]
[im 1/1]
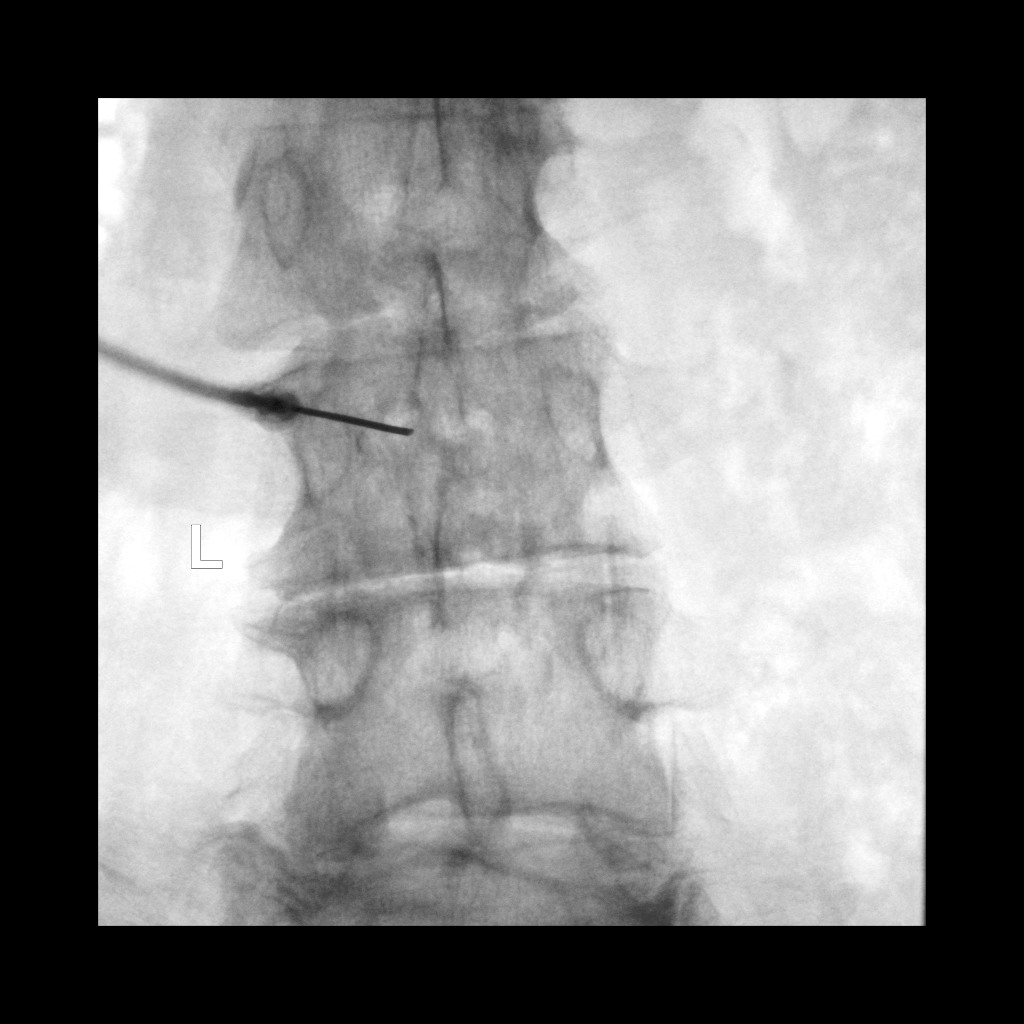

[Series 2: ortho standard · 1 of 1 slices shown (2 of 2)]
[im 1/1]
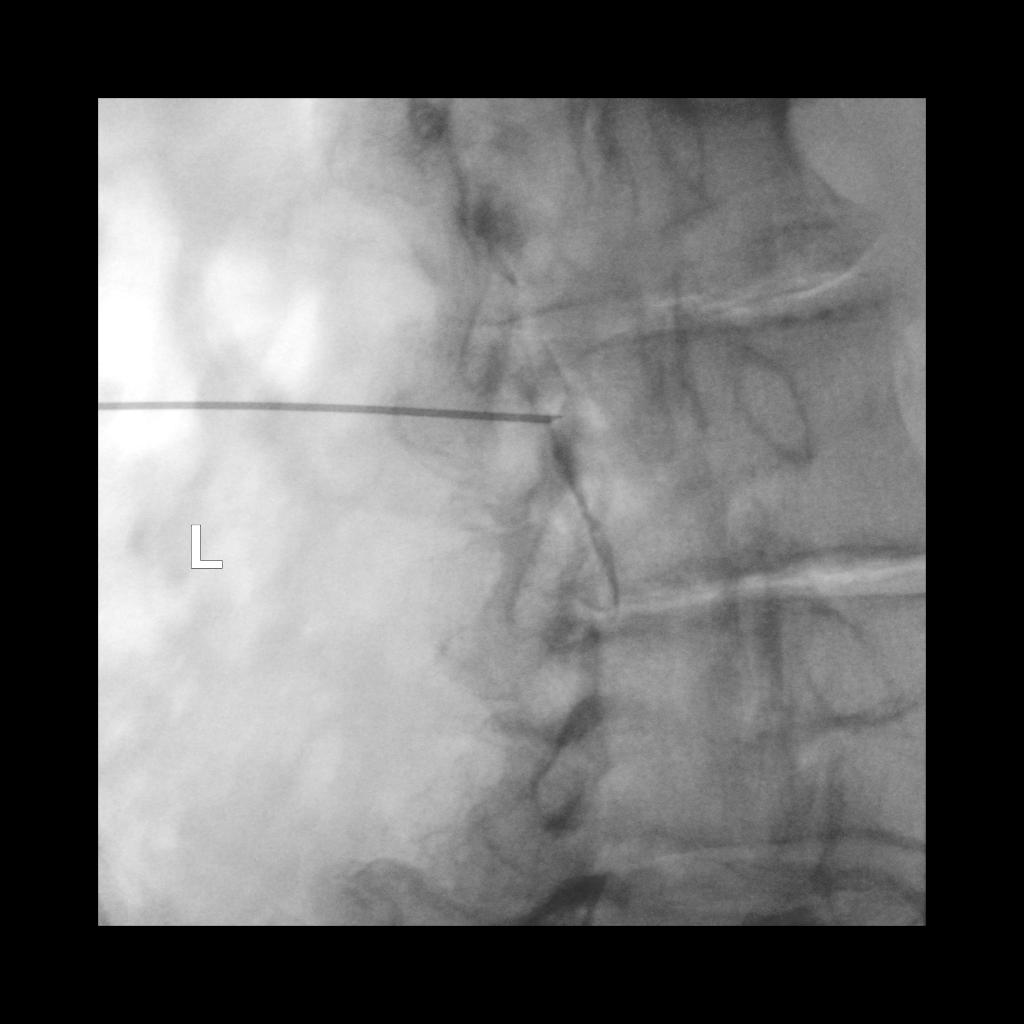

[2 of 2 positions shown; findings below may reference images not displayed]

IMPRESSION: 1. Technically successful lumbar epidural blood patch under
fluoroscopy. The patient was counseled on the importance of laying
flat for an additional 24 hours and maintaining good p.o. fluid
intake.

## 2016-02-03 MED ORDER — IOPAMIDOL (ISOVUE-M 200) INJECTION 41%
1.0000 mL | Freq: Once | INTRAMUSCULAR | Status: AC
  Administered 2016-02-03: 1 mL via EPIDURAL

## 2016-02-03 NOTE — Discharge Instructions (Signed)

## 2016-02-03 NOTE — Telephone Encounter (Signed)
Spoke with [facility], got correct order. Pended for physician to sign off.

## 2016-02-03 NOTE — Progress Notes (Signed)
Blood drawn from left [facility] for blood patch, 20 cc's obtained. Site is unremarkable and pt tolerated procedure well.

## 2016-02-05 DIAGNOSIS — Z1211 Encounter for screening for malignant neoplasm of colon: Secondary | ICD-10-CM | POA: Diagnosis not present

## 2016-02-05 DIAGNOSIS — Z1212 Encounter for screening for malignant neoplasm of rectum: Secondary | ICD-10-CM | POA: Diagnosis not present

## 2016-02-09 DIAGNOSIS — J301 Allergic rhinitis due to pollen: Secondary | ICD-10-CM | POA: Diagnosis not present

## 2016-02-09 DIAGNOSIS — J3081 Allergic rhinitis due to animal (cat) (dog) hair and dander: Secondary | ICD-10-CM | POA: Diagnosis not present

## 2016-02-09 DIAGNOSIS — J3089 Other allergic rhinitis: Secondary | ICD-10-CM | POA: Diagnosis not present

## 2016-02-11 ENCOUNTER — Telehealth: Payer: Self-pay | Admitting: Sports Medicine

## 2016-02-11 NOTE — Telephone Encounter (Signed)
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  If she checks her AVS from the visit, it clearly advises her to return to the office to go over CT myelogram results because she won't understand [place] simply telling her the results.  Likely plan is for cervical facet injections.

## 2016-02-11 NOTE — Telephone Encounter (Signed)
Patient called very upset that she had 3 recent imaging test done (2) on 11/16 and (1) on 02/03/16 and she has called twice to see about the results and what they next step will be and know one has responded back to her and it has been 2 weeks. Pt would like a call back from a nurse. Thanks

## 2016-02-12 ENCOUNTER — Ambulatory Visit (INDEPENDENT_AMBULATORY_CARE_PROVIDER_SITE_OTHER): Payer: Medicare Other | Admitting: Licensed Clinical Social Worker

## 2016-02-12 DIAGNOSIS — F419 Anxiety disorder, unspecified: Secondary | ICD-10-CM

## 2016-02-12 DIAGNOSIS — F3341 Major depressive disorder, recurrent, in partial remission: Secondary | ICD-10-CM

## 2016-02-12 MED FILL — GABAPENTIN 600 MG TABLET: 600 | 29 days supply | Qty: 115 | Fill #0

## 2016-02-12 NOTE — Telephone Encounter (Signed)
Left VM letting pt know that results are indepth and that she can call to schedule appointment or to get results.

## 2016-02-13 LAB — COLOGUARD: Cologuard: NEGATIVE

## 2016-02-16 ENCOUNTER — Other Ambulatory Visit: Payer: Self-pay | Admitting: *Deleted

## 2016-02-16 ENCOUNTER — Encounter: Payer: Self-pay | Admitting: Sports Medicine

## 2016-02-16 ENCOUNTER — Ambulatory Visit (INDEPENDENT_AMBULATORY_CARE_PROVIDER_SITE_OTHER): Payer: Medicare Other | Admitting: Sports Medicine

## 2016-02-16 DIAGNOSIS — M5030 Other cervical disc degeneration, unspecified cervical region: Secondary | ICD-10-CM

## 2016-02-16 MED ORDER — ZOLPIDEM TARTRATE 5 MG PO TABS: 5.0000 mg | ORAL_TABLET | Freq: Every evening | ORAL | 5 refills | Status: DC | PRN

## 2016-02-16 MED FILL — ZOLPIDEM TARTRATE 5 MG TAB: 5 | 30 days supply | Qty: 30 | Fill #0

## 2016-02-16 NOTE — Progress Notes (Signed)
  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 She cannot have an MRI. Pain the cervical myelogram that was complicated by a CSF leak and spinal headache, she had an epidural blood patch that improved this. She tells me her pain is mostly on the left side of her neck with radiation into the upper trapezius, worse with turning the neck side to side, nothing overtly radicular the fingers. Moderate, persistent.  Past medical history:  Negative.  See flowsheet/record as well for more information.  Surgical history: Negative.  See flowsheet/record as well for more information.  Family history: Negative.  See flowsheet/record as well for more information.  Social history: Negative.  See flowsheet/record as well for more information.  Allergies, and medications have been entered into the medical record, reviewed, and no changes needed.   Review of Systems: No fevers, chills, night sweats, weight loss, chest pain, or shortness of breath.   Objective:    General: Well Developed, well nourished, and in no acute distress.  Neuro: Alert and oriented x3, extra-ocular muscles intact, sensation grossly intact.  HEENT: Normocephalic, atraumatic, pupils equal round reactive to light, neck supple, no masses, no lymphadenopathy, thyroid nonpalpable.  Skin: Warm and dry, no rashes. Cardiac: Regular rate and rhythm, no murmurs rubs or gallops, no lower extremity edema.  Respiratory: Clear to auscultation bilaterally. Not using accessory muscles, speaking in full sentences.  Cervical myelogram CT was reviewed personally, there is a stable fusion from C4-C7 with the exception of the C6-C7 level where there is a pseudoarthrosis. There is also moderate facet arthritis worse on the left side from C2-C4 as well as at the C7-T1 level.  Impression and Recommendations:     DDD (degenerative disc disease), cervical Multilevel cervical spondylitic-type processes, we will start with diagnostic and therapeutic facet injections on the left at C2-C3, C3-C4, and C7-T1. She also has adjacent level disease at the C3-C4 level. Return to see me one month after facet joint injections  I spent 25 minutes with this patient, greater than 50% was face-to-face time counseling regarding the above diagnoses

## 2016-02-16 NOTE — Assessment & Plan Note (Signed)
Multilevel cervical spondylitic-type processes, we will start with diagnostic and therapeutic facet injections on the left at C2-C3, C3-C4, and C7-T1. She also has adjacent level disease at the C3-C4 level. Return to see me one month after facet joint injections

## 2016-02-26 ENCOUNTER — Other Ambulatory Visit: Payer: Self-pay | Admitting: Family Medicine

## 2016-02-26 DIAGNOSIS — J301 Allergic rhinitis due to pollen: Secondary | ICD-10-CM | POA: Diagnosis not present

## 2016-02-26 DIAGNOSIS — J3089 Other allergic rhinitis: Secondary | ICD-10-CM | POA: Diagnosis not present

## 2016-02-26 DIAGNOSIS — J3081 Allergic rhinitis due to animal (cat) (dog) hair and dander: Secondary | ICD-10-CM | POA: Diagnosis not present

## 2016-02-26 MED FILL — traMADol HCL 50 MG TABS: 50 | 10 days supply | Qty: 60 | Fill #5

## 2016-02-26 MED FILL — BUPROPION HCL XL 150 MG TAB: 150 | 90 days supply | Qty: 270 | Fill #0

## 2016-02-26 MED FILL — CYCLOBENZAPRINE 10 MG TAB: 10 | 30 days supply | Qty: 60 | Fill #1

## 2016-02-27 ENCOUNTER — Other Ambulatory Visit: Payer: Self-pay | Admitting: Sports Medicine

## 2016-02-27 ENCOUNTER — Ambulatory Visit
Admission: RE | Admit: 2016-02-27 | Discharge: 2016-02-27 | Disposition: A | Discharge status: Home / Self Care | Payer: Medicare Other | Source: Ambulatory Visit | Attending: Sports Medicine | Admitting: Sports Medicine

## 2016-02-27 DIAGNOSIS — M5030 Other cervical disc degeneration, unspecified cervical region: Secondary | ICD-10-CM

## 2016-02-27 IMAGING — XA DG FACET JT INJ C&T SPINE 2ND LEVEL *L*
4 series · 4 of 4 positions shown · non-contrast
Comparison: CT [DATE]

CLINICAL DATA: Previous cervical fusion C4-C7. Facet arthropathy
left worse than right. Neck pain left worse than right.

EXAM:
Left C2-3, C3-4 and C7-T1 facet injections

[Series 1: ortho standard · 1 of 1 slices shown (1 of 4)]
[im 1/1]
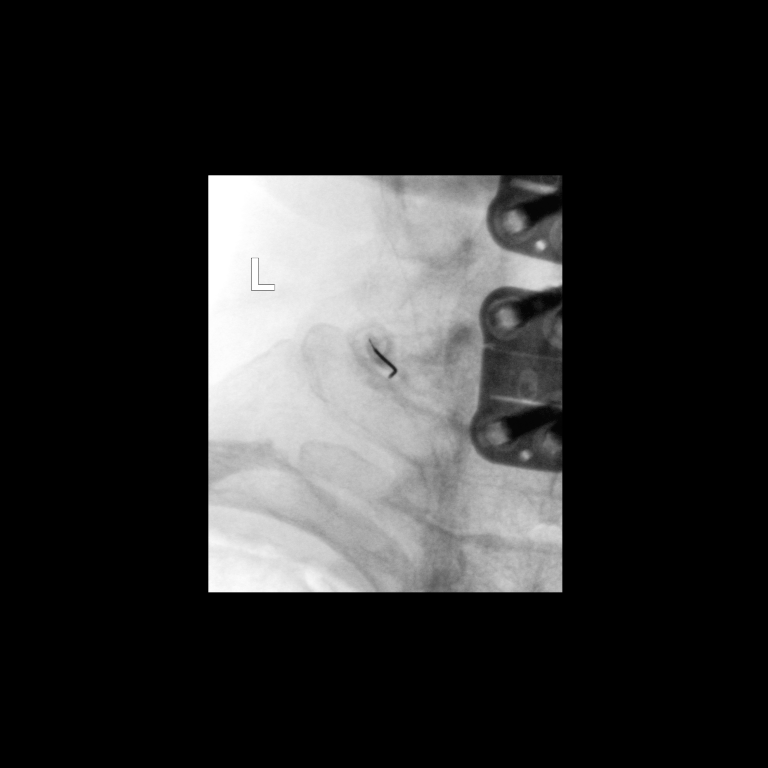

[Series 2: ortho standard · 1 of 1 slices shown (2 of 4)]
[im 1/1]
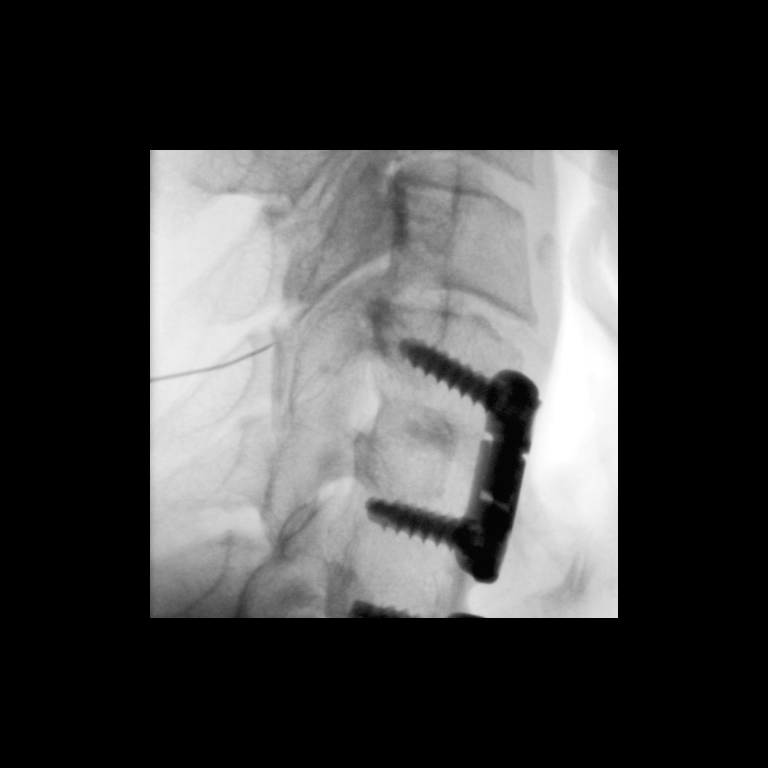

[Series 3: ortho standard · 1 of 1 slices shown (3 of 4)]
[im 1/1]
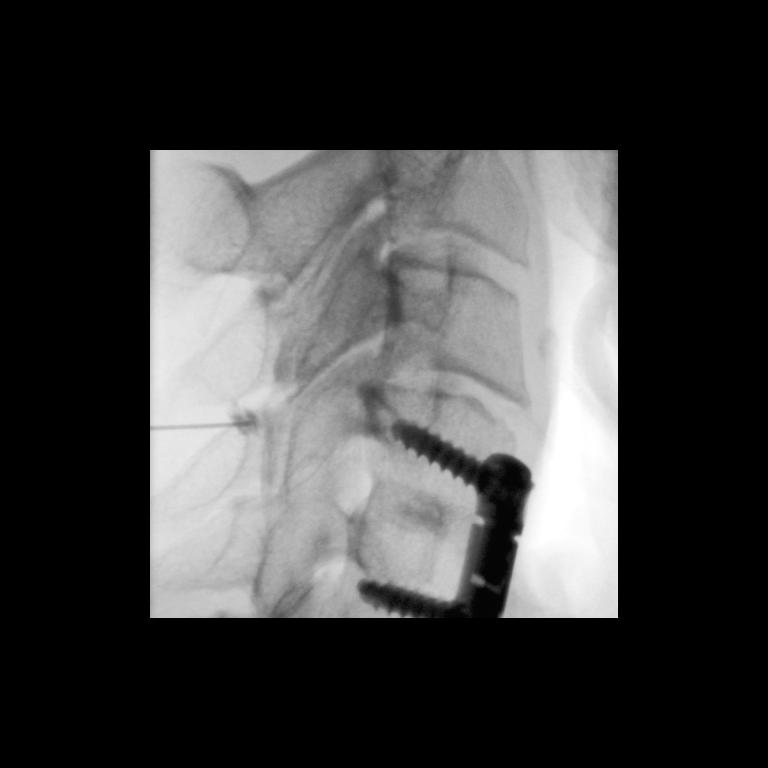

[Series 4: ortho standard · 1 of 1 slices shown (4 of 4)]
[im 1/1]
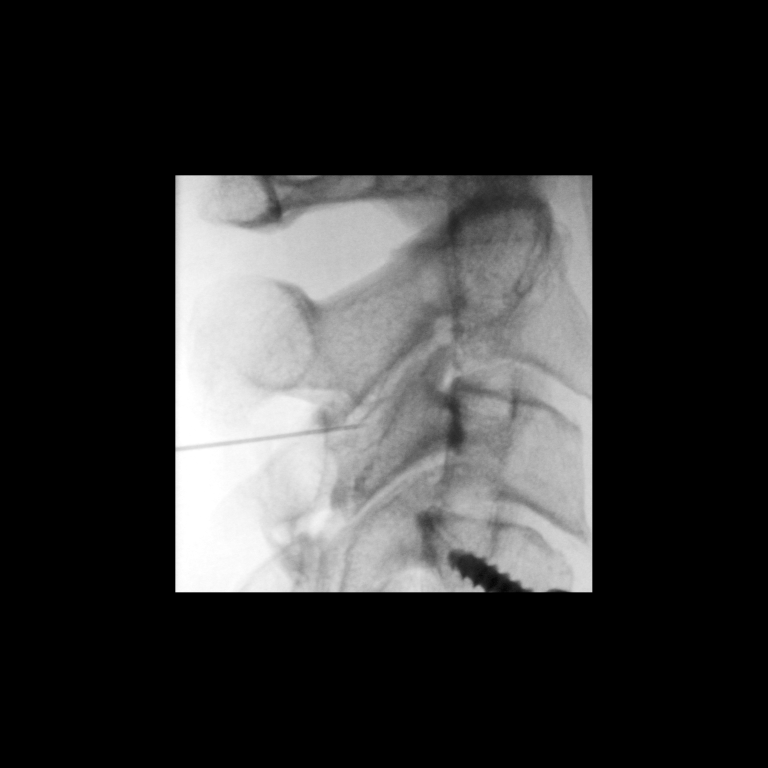

[4 of 4 positions shown; findings below may reference images not displayed]

PROCEDURE:
The procedure, risks, benefits, and alternatives were explained to
the patient. Questions regarding the procedure were encouraged and
answered. The patient understands and consents to the procedure.

Posterior oblique approaches were taken to the facets on the left at
C2-3, C3-4 and C7-T1 using curved 25 gauge spinal needles.
Intra-articular positioning was confirmed at C2-3 by injecting a
small amount of Isovue-M 200. At C3-4 and C7-T1, there was
intra-articular and periarticular spread. No vascular opacification
is seen. 3 mg Solu-Medrol mixed with a few drops of 1% lidocaine or
instilled into each joint. The procedure was well-tolerated.

FLUOROSCOPY TIME:  2 minutes 13 seconds. 113.66 micro gray meter
squared
IMPRESSION: Technically successful [REDACTED] C3-4 and C7-[L3] injection .

## 2016-02-27 MED ORDER — IOPAMIDOL (ISOVUE-M 300) INJECTION 61%: 1.0000 mL | Freq: Once | INTRAMUSCULAR | Status: DC | PRN

## 2016-02-27 MED ORDER — DEXAMETHASONE SODIUM PHOSPHATE 4 MG/ML IJ SOLN: 9.0000 mg | Freq: Once | INTRAMUSCULAR | Status: DC

## 2016-02-27 NOTE — Discharge Instructions (Signed)

## 2016-03-04 ENCOUNTER — Ambulatory Visit (INDEPENDENT_AMBULATORY_CARE_PROVIDER_SITE_OTHER): Payer: Medicare Other | Admitting: Licensed Clinical Social Worker

## 2016-03-04 DIAGNOSIS — F3341 Major depressive disorder, recurrent, in partial remission: Secondary | ICD-10-CM

## 2016-03-04 DIAGNOSIS — F419 Anxiety disorder, unspecified: Secondary | ICD-10-CM | POA: Diagnosis not present

## 2016-03-10 DIAGNOSIS — J301 Allergic rhinitis due to pollen: Secondary | ICD-10-CM | POA: Diagnosis not present

## 2016-03-10 DIAGNOSIS — J3081 Allergic rhinitis due to animal (cat) (dog) hair and dander: Secondary | ICD-10-CM | POA: Diagnosis not present

## 2016-03-10 DIAGNOSIS — J3089 Other allergic rhinitis: Secondary | ICD-10-CM | POA: Diagnosis not present

## 2016-03-12 MED FILL — PROAIR HFA 90 MCG INHALER: 108 (90 BAS | 25 days supply | Qty: 9 | Fill #1

## 2016-03-17 MED FILL — ZOLPIDEM TARTRATE 5 MG TAB: 5 | 30 days supply | Qty: 30 | Fill #1

## 2016-03-17 MED FILL — GABAPENTIN 600 MG TABLET: 600 | 29 days supply | Qty: 115 | Fill #1

## 2016-03-22 ENCOUNTER — Ambulatory Visit (INDEPENDENT_AMBULATORY_CARE_PROVIDER_SITE_OTHER): Payer: Medicare Other | Admitting: Family Medicine

## 2016-03-22 ENCOUNTER — Encounter: Payer: Self-pay | Admitting: Family Medicine

## 2016-03-22 VITALS — BP 125/61 | HR 99 | Ht 66.0 in | Wt 123.0 lb

## 2016-03-22 DIAGNOSIS — M5030 Other cervical disc degeneration, unspecified cervical region: Secondary | ICD-10-CM | POA: Diagnosis not present

## 2016-03-22 DIAGNOSIS — G4700 Insomnia, unspecified: Secondary | ICD-10-CM

## 2016-03-22 DIAGNOSIS — M797 Fibromyalgia: Secondary | ICD-10-CM | POA: Diagnosis not present

## 2016-03-22 NOTE — Progress Notes (Signed)
Subjective:    CC: Fibromyalgia  HPI:  Follow-up fibromyalgia - When I last saw her we decided to adjust her gabapentin so that she could take an extra tab in the middle of the day or half a tab if needed. She has been back to see Dr. Dianah Field for her degenerative disc disease.  Cervical degenerative disc disease-patient saw Dr. Dianah Field and they have planned on doing multilevel therapeutic facet injections at the cervical spine.  Insomnia - doing well on zolpidem 5 mg nightly.   Past medical history, Surgical history, Family history not pertinant except as noted below, Social history, Allergies, and medications have been entered into the medical record, reviewed, and corrections made.   Review of Systems: No fevers, chills, night sweats, weight loss, chest pain, or shortness of breath.   Objective:    General: Well Developed, well nourished, and in no acute distress.  Neuro: Alert and oriented x3, extra-ocular muscles intact, sensation grossly intact.  HEENT: Normocephalic, atraumatic  Skin: Warm and dry, no rashes. Cardiac: Regular rate and rhythm, no murmurs rubs or gallops, no lower extremity edema.  Respiratory: Clear to auscultation bilaterally. Not using accessory muscles, speaking in full sentences.   Impression and Recommendations:    fibromyalgia - Continue current regimen. The addition of the middle of the day gabapentin as needed has really been helpful especially for the burning sensation in her right side.Follow-up in 2-3 months.  Cervical degenerative disc disease-she has had some improvement with the facet injections and has a follow-up with her sports medicine doctor in about 10 days.  Insomnia - any current regimen. Follow-up in 6 months.

## 2016-03-23 ENCOUNTER — Other Ambulatory Visit: Payer: Self-pay | Admitting: Family Medicine

## 2016-03-23 ENCOUNTER — Telehealth: Payer: Self-pay | Admitting: Family Medicine

## 2016-03-23 DIAGNOSIS — M19042 Primary osteoarthritis, left hand: Principal | ICD-10-CM

## 2016-03-23 DIAGNOSIS — M19041 Primary osteoarthritis, right hand: Secondary | ICD-10-CM

## 2016-03-23 MED ORDER — CELECOXIB 200 MG PO CAPS: ORAL_CAPSULE | ORAL | 2 refills | Status: DC

## 2016-03-23 MED ORDER — TRAMADOL HCL 50 MG PO TABS: ORAL_TABLET | ORAL | 5 refills | Status: DC

## 2016-03-23 MED FILL — traMADol HCL 50 MG TABS: 50 | 10 days supply | Qty: 60 | Fill #0

## 2016-03-23 NOTE — Telephone Encounter (Signed)
Please call patient and let her know that her color guard results were negative. Recommend repeat in 3 years. Beatrice Lecher, MD

## 2016-03-23 NOTE — Telephone Encounter (Signed)
Pt informed.Robin Conrad  

## 2016-03-24 ENCOUNTER — Other Ambulatory Visit: Payer: Self-pay

## 2016-03-24 MED ORDER — CYCLOBENZAPRINE HCL 10 MG PO TABS: ORAL_TABLET | ORAL | 1 refills | Status: DC

## 2016-03-24 MED FILL — CYCLOBENZAPRINE 10 MG TAB: 10 | 30 days supply | Qty: 60 | Fill #0

## 2016-03-25 DIAGNOSIS — J301 Allergic rhinitis due to pollen: Secondary | ICD-10-CM | POA: Diagnosis not present

## 2016-03-25 DIAGNOSIS — J3081 Allergic rhinitis due to animal (cat) (dog) hair and dander: Secondary | ICD-10-CM | POA: Diagnosis not present

## 2016-03-25 DIAGNOSIS — J3089 Other allergic rhinitis: Secondary | ICD-10-CM | POA: Diagnosis not present

## 2016-03-29 ENCOUNTER — Ambulatory Visit (INDEPENDENT_AMBULATORY_CARE_PROVIDER_SITE_OTHER): Payer: Medicare Other | Admitting: Sports Medicine

## 2016-03-29 ENCOUNTER — Encounter: Payer: Self-pay | Admitting: Sports Medicine

## 2016-03-29 DIAGNOSIS — M5030 Other cervical disc degeneration, unspecified cervical region: Secondary | ICD-10-CM

## 2016-03-29 NOTE — Progress Notes (Signed)
  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 More recently we obtained multilevel facet joint injections, this provided no relief, really not even temporary. 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 Pain continued to be moderate, persistent.  Past medical history:  Negative.  See flowsheet/record as well for more information.  Surgical history: Negative.  See flowsheet/record as well for more information.  Family history: Negative.  See flowsheet/record as well for more information.  Social history: Negative.  See flowsheet/record as well for more information.  Allergies, and medications have been entered into the medical record, reviewed, and no changes needed.   Review of Systems: No fevers, chills, night sweats, weight loss, chest pain, or shortness of breath.   Objective:    General: Well Developed, well nourished, and in no acute distress.  Neuro: Alert and oriented x3, extra-ocular muscles intact, sensation grossly intact.  HEENT: Normocephalic, atraumatic, pupils equal round reactive to light, neck supple, no masses, no lymphadenopathy, thyroid nonpalpable.  Skin: Warm and dry, no rashes. Cardiac: Regular rate and rhythm, no murmurs rubs or gallops, no lower extremity edema.  Respiratory: Clear to auscultation bilaterally. Not using accessory muscles, speaking in full sentences.  Impression and Recommendations:    DDD (degenerative disc disease), cervical Persistent pain, essentially no better after left C2-C3, C3-C4, and C7-T1 facet joint injections. 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 I'm going to set her up for dry needling with physical therapy and a second opinion regarding her  adjacent level disease and pseudoarthrosis with neurosurgery at [facility]. I have asked her to burn her images to a disc and take it with her to her appointment.  I spent 40 minutes with this patient, greater than 50% was face-to-face time counseling regarding the above diagnoses

## 2016-03-29 NOTE — Assessment & Plan Note (Signed)
Persistent pain, essentially no better after left C2-C3, C3-C4, and C7-T1 facet joint injections. 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 I'm going to set her up for dry needling with physical therapy and a second opinion regarding her adjacent level disease and pseudoarthrosis with neurosurgery at [facility]. I have asked her to burn her images to a disc and take it with her to her appointment.

## 2016-04-01 ENCOUNTER — Ambulatory Visit: Payer: Medicare Other | Admitting: Licensed Clinical Social Worker

## 2016-04-01 ENCOUNTER — Encounter: Payer: Self-pay | Admitting: Family Medicine

## 2016-04-02 ENCOUNTER — Encounter: Payer: Self-pay | Admitting: Rehabilitative and Restorative Service Providers"

## 2016-04-02 ENCOUNTER — Ambulatory Visit (INDEPENDENT_AMBULATORY_CARE_PROVIDER_SITE_OTHER): Payer: Medicare Other | Admitting: Rehabilitative and Restorative Service Providers"

## 2016-04-02 DIAGNOSIS — R293 Abnormal posture: Secondary | ICD-10-CM | POA: Diagnosis not present

## 2016-04-02 DIAGNOSIS — R29898 Other symptoms and signs involving the musculoskeletal system: Secondary | ICD-10-CM | POA: Diagnosis not present

## 2016-04-02 NOTE — Patient Instructions (Addendum)
Shoulder Blade Squeeze    Rotate shoulders back, then squeeze shoulder blades down and back  Hold 10 sec Repeat __10 __ times. Do __2-3 __ sessions per day.   Trigger Point Dry Needling  . What is Trigger Point Dry Needling (DN)? o DN is a physical therapy technique used to treat muscle pain and dysfunction. Specifically, DN helps deactivate muscle trigger points (muscle knots).  o A thin filiform needle is used to penetrate the skin and stimulate the underlying trigger point. The goal is for a local twitch response (LTR) to occur and for the trigger point to relax. No medication of any kind is injected during the procedure.   . What Does Trigger Point Dry Needling Feel Like?  o The procedure feels different for each individual patient. Some patients report that they do not actually feel the needle enter the skin and overall the process is not painful. Very mild bleeding may occur. However, many patients feel a deep cramping in the muscle in which the needle was inserted. This is the local twitch response.   Marland Kitchen How Will I feel after the treatment? o Soreness is normal, and the onset of soreness may not occur for a few hours. Typically this soreness does not last longer than two days.  o Bruising is uncommon, however; ice can be used to decrease any possible bruising.  o In rare cases feeling tired or nauseous after the treatment is normal. In addition, your symptoms may get worse before they get better, this period will typically not last longer than 24 hours.   . What Can I do After My Treatment? o Increase your hydration by drinking more water for the next 24 hours. o You may place ice or heat on the areas treated that have become sore, however, do not use heat on inflamed or bruised areas. Heat often brings more relief post needling. o You can continue your regular activities, but vigorous activity is not recommended initially after the treatment for 24 hours. o DN is best combined with  other physical therapy such as strengthening, stretching, and other therapies.   TENS UNIT: This is helpful for muscle pain and spasm.   Search and Purchase a TENS 7000 2nd edition at www.tenspros.com. It should be less than $30.     TENS unit instructions: Do not shower or bathe with the unit on Turn the unit off before removing electrodes or batteries If the electrodes lose stickiness add a drop of water to the electrodes after they are disconnected from the unit and place on plastic sheet. If you continued to have difficulty, call the TENS unit company to purchase more electrodes. Do not apply lotion on the skin area prior to use. Make sure the skin is clean and dry as this will help prolong the life of the electrodes. After use, always check skin for unusual red areas, rash or other skin difficulties. If there are any skin problems, does not apply electrodes to the same area. Never remove the electrodes from the unit by pulling the wires. Do not use the TENS unit or electrodes other than as directed. Do not change electrode placement without consultating your therapist or physician. Keep 2 fingers with between each electrode.
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
## 2016-04-06 DIAGNOSIS — J301 Allergic rhinitis due to pollen: Secondary | ICD-10-CM | POA: Diagnosis not present

## 2016-04-06 DIAGNOSIS — J3089 Other allergic rhinitis: Secondary | ICD-10-CM | POA: Diagnosis not present

## 2016-04-06 DIAGNOSIS — J3081 Allergic rhinitis due to animal (cat) (dog) hair and dander: Secondary | ICD-10-CM | POA: Diagnosis not present

## 2016-04-07 ENCOUNTER — Ambulatory Visit (INDEPENDENT_AMBULATORY_CARE_PROVIDER_SITE_OTHER): Payer: Medicare Other | Admitting: Physical Therapy

## 2016-04-07 DIAGNOSIS — M25562 Pain in left knee: Secondary | ICD-10-CM | POA: Diagnosis not present

## 2016-04-07 DIAGNOSIS — R293 Abnormal posture: Secondary | ICD-10-CM

## 2016-04-07 DIAGNOSIS — M25561 Pain in right knee: Secondary | ICD-10-CM | POA: Diagnosis not present

## 2016-04-07 DIAGNOSIS — R29898 Other symptoms and signs involving the musculoskeletal system: Secondary | ICD-10-CM
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
## 2016-04-08 ENCOUNTER — Ambulatory Visit (INDEPENDENT_AMBULATORY_CARE_PROVIDER_SITE_OTHER): Payer: Medicare Other | Admitting: Licensed Clinical Social Worker

## 2016-04-08 DIAGNOSIS — F3341 Major depressive disorder, recurrent, in partial remission: Secondary | ICD-10-CM | POA: Diagnosis not present

## 2016-04-08 MED FILL — LEVOTHYROXINE 50 MCG TABLET: 50 | 90 days supply | Qty: 90 | Fill #1

## 2016-04-09 ENCOUNTER — Ambulatory Visit (INDEPENDENT_AMBULATORY_CARE_PROVIDER_SITE_OTHER): Payer: Medicare Other | Admitting: Rehabilitative and Restorative Service Providers"

## 2016-04-09 ENCOUNTER — Encounter: Payer: Self-pay | Admitting: Rehabilitative and Restorative Service Providers"

## 2016-04-09 DIAGNOSIS — M25562 Pain in left knee: Secondary | ICD-10-CM | POA: Diagnosis not present

## 2016-04-09 DIAGNOSIS — R29898 Other symptoms and signs involving the musculoskeletal system: Secondary | ICD-10-CM | POA: Diagnosis not present

## 2016-04-09 DIAGNOSIS — M25561 Pain in right knee: Secondary | ICD-10-CM | POA: Diagnosis not present

## 2016-04-09 DIAGNOSIS — R293 Abnormal posture: Secondary | ICD-10-CM | POA: Diagnosis not present
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
## 2016-04-13 ENCOUNTER — Ambulatory Visit (INDEPENDENT_AMBULATORY_CARE_PROVIDER_SITE_OTHER): Payer: Medicare Other | Admitting: Rehabilitative and Restorative Service Providers"

## 2016-04-13 ENCOUNTER — Encounter: Payer: Self-pay | Admitting: Rehabilitative and Restorative Service Providers"

## 2016-04-13 DIAGNOSIS — R293 Abnormal posture: Secondary | ICD-10-CM

## 2016-04-13 DIAGNOSIS — M25562 Pain in left knee: Secondary | ICD-10-CM

## 2016-04-13 DIAGNOSIS — M25561 Pain in right knee: Secondary | ICD-10-CM

## 2016-04-13 DIAGNOSIS — R29898 Other symptoms and signs involving the musculoskeletal system: Secondary | ICD-10-CM | POA: Diagnosis not present
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
## 2016-04-15 MED FILL — ZOLPIDEM TARTRATE 5 MG TAB: 5 | 30 days supply | Qty: 30 | Fill #2

## 2016-04-16 ENCOUNTER — Ambulatory Visit (INDEPENDENT_AMBULATORY_CARE_PROVIDER_SITE_OTHER): Payer: Medicare Other | Admitting: Rehabilitative and Restorative Service Providers"

## 2016-04-16 ENCOUNTER — Encounter: Payer: Self-pay | Admitting: Rehabilitative and Restorative Service Providers"

## 2016-04-16 DIAGNOSIS — M25561 Pain in right knee: Secondary | ICD-10-CM | POA: Diagnosis not present

## 2016-04-16 DIAGNOSIS — R293 Abnormal posture: Secondary | ICD-10-CM

## 2016-04-16 DIAGNOSIS — M25562 Pain in left knee: Secondary | ICD-10-CM

## 2016-04-16 DIAGNOSIS — R29898 Other symptoms and signs involving the musculoskeletal system: Secondary | ICD-10-CM
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
## 2016-04-19 ENCOUNTER — Telehealth: Payer: Self-pay | Admitting: *Deleted

## 2016-04-19 ENCOUNTER — Telehealth: Payer: Self-pay

## 2016-04-19 MED ORDER — OSELTAMIVIR PHOSPHATE 75 MG PO CAPS: 75.0000 mg | ORAL_CAPSULE | Freq: Every day | ORAL | 0 refills | Status: DC

## 2016-04-19 NOTE — Telephone Encounter (Signed)
If he is being treated or tested positive then we can send prophylatic dosing of once a day.

## 2016-04-19 NOTE — Telephone Encounter (Signed)
CFAP7M - Rx #: [id number] (zolpidem)  Approvedtoday  CaseId:[id number];Product Name:High Risk Medications - Cyclobenzaprine - ESI - MEDICARE (ACS, HPC, PrP, PDPD);Status:Approved;Coverage Start Date:03/20/2016;Coverage End Date:04/19/2017;

## 2016-04-19 NOTE — Telephone Encounter (Signed)
Robin Conrad's husband has the flu. She would like tamiflu sent in to prevent her getting the flu. She has no symptoms. Please advise.

## 2016-04-19 NOTE — Telephone Encounter (Signed)
Medication sent and patient advised. 

## 2016-04-20 NOTE — Telephone Encounter (Signed)
Pharmacy notified and will notify pt.

## 2016-04-21 MED FILL — traMADol HCL 50 MG TABS: 50 | 10 days supply | Qty: 60 | Fill #1

## 2016-04-22 DIAGNOSIS — J3081 Allergic rhinitis due to animal (cat) (dog) hair and dander: Secondary | ICD-10-CM | POA: Diagnosis not present

## 2016-04-22 DIAGNOSIS — J3089 Other allergic rhinitis: Secondary | ICD-10-CM | POA: Diagnosis not present

## 2016-04-22 DIAGNOSIS — J301 Allergic rhinitis due to pollen: Secondary | ICD-10-CM | POA: Diagnosis not present

## 2016-04-26 DIAGNOSIS — J45909 Unspecified asthma, uncomplicated: Secondary | ICD-10-CM | POA: Diagnosis not present

## 2016-04-26 DIAGNOSIS — M5031 Other cervical disc degeneration,  high cervical region: Secondary | ICD-10-CM | POA: Diagnosis not present

## 2016-04-26 DIAGNOSIS — E039 Hypothyroidism, unspecified: Secondary | ICD-10-CM | POA: Diagnosis not present

## 2016-04-26 DIAGNOSIS — M62838 Other muscle spasm: Secondary | ICD-10-CM | POA: Diagnosis not present

## 2016-04-26 DIAGNOSIS — M4802 Spinal stenosis, cervical region: Secondary | ICD-10-CM | POA: Diagnosis not present

## 2016-04-26 DIAGNOSIS — Z888 Allergy status to other drugs, medicaments and biological substances status: Secondary | ICD-10-CM | POA: Diagnosis not present

## 2016-04-26 DIAGNOSIS — M5030 Other cervical disc degeneration, unspecified cervical region: Secondary | ICD-10-CM | POA: Diagnosis not present

## 2016-04-26 DIAGNOSIS — M797 Fibromyalgia: Secondary | ICD-10-CM | POA: Diagnosis not present

## 2016-04-26 DIAGNOSIS — Z981 Arthrodesis status: Secondary | ICD-10-CM | POA: Diagnosis not present

## 2016-04-26 DIAGNOSIS — Z885 Allergy status to narcotic agent status: Secondary | ICD-10-CM | POA: Diagnosis not present

## 2016-04-28 ENCOUNTER — Telehealth: Payer: Self-pay

## 2016-04-28 NOTE — Telephone Encounter (Signed)
Pt left VM asking if she needs a f/u appointment with you after seeing specialist. Pt would also like to know if she should continue dry needling, doesn't feel it's helping and is very painful. Please advise.

## 2016-04-29 ENCOUNTER — Ambulatory Visit (INDEPENDENT_AMBULATORY_CARE_PROVIDER_SITE_OTHER): Payer: Medicare Other | Admitting: Licensed Clinical Social Worker

## 2016-04-29 DIAGNOSIS — F3341 Major depressive disorder, recurrent, in partial remission: Secondary | ICD-10-CM | POA: Diagnosis not present

## 2016-04-29 DIAGNOSIS — J3081 Allergic rhinitis due to animal (cat) (dog) hair and dander: Secondary | ICD-10-CM | POA: Diagnosis not present

## 2016-04-29 DIAGNOSIS — J3089 Other allergic rhinitis: Secondary | ICD-10-CM | POA: Diagnosis not present

## 2016-04-29 DIAGNOSIS — J301 Allergic rhinitis due to pollen: Secondary | ICD-10-CM | POA: Diagnosis not present

## 2016-04-29 MED FILL — CYCLOBENZAPRINE 10 MG TAB: 10 | 30 days supply | Qty: 60 | Fill #1

## 2016-04-29 MED FILL — GABAPENTIN 600 MG TABLET: 600 | 29 days supply | Qty: 115 | Fill #2

## 2016-04-29 NOTE — Telephone Encounter (Signed)
She does not need to see me after the consultation with neurosurgery, also a dry needling is not helping she can stop it, would recommend that she continue other modalities of physical therapy.

## 2016-04-29 NOTE — Telephone Encounter (Signed)
Pt.notified

## 2016-05-04 ENCOUNTER — Telehealth: Payer: Self-pay | Admitting: *Deleted

## 2016-05-04 NOTE — Telephone Encounter (Signed)
CFAP7M - Rx #: [id number] (zolpidem  This has been to approved. Case ID [id number]. Effective dates 04/04/2016-05/04/2017  Pharm and patient notified via vm

## 2016-05-07 MED FILL — AMOXICILLIN 500 MG CAPSULE: 500 | 8 days supply | Qty: 30 | Fill #0

## 2016-05-13 ENCOUNTER — Ambulatory Visit (INDEPENDENT_AMBULATORY_CARE_PROVIDER_SITE_OTHER): Payer: Medicare Other | Admitting: Licensed Clinical Social Worker

## 2016-05-13 DIAGNOSIS — F3341 Major depressive disorder, recurrent, in partial remission: Secondary | ICD-10-CM

## 2016-05-13 MED FILL — ZOLPIDEM TARTRATE 5 MG TAB: 5 | 30 days supply | Qty: 30 | Fill #3

## 2016-05-19 DIAGNOSIS — J3089 Other allergic rhinitis: Secondary | ICD-10-CM | POA: Diagnosis not present

## 2016-05-19 DIAGNOSIS — J3081 Allergic rhinitis due to animal (cat) (dog) hair and dander: Secondary | ICD-10-CM | POA: Diagnosis not present

## 2016-05-19 DIAGNOSIS — J301 Allergic rhinitis due to pollen: Secondary | ICD-10-CM | POA: Diagnosis not present

## 2016-05-20 MED FILL — traMADol HCL 50 MG TABS: 50 | 10 days supply | Qty: 60 | Fill #2

## 2016-05-27 DIAGNOSIS — J3081 Allergic rhinitis due to animal (cat) (dog) hair and dander: Secondary | ICD-10-CM | POA: Diagnosis not present

## 2016-05-27 DIAGNOSIS — J301 Allergic rhinitis due to pollen: Secondary | ICD-10-CM | POA: Diagnosis not present

## 2016-05-27 DIAGNOSIS — J3089 Other allergic rhinitis: Secondary | ICD-10-CM | POA: Diagnosis not present

## 2016-05-31 MED FILL — BUPROPION HCL XL 150 MG TAB: 150 | 30 days supply | Qty: 90 | Fill #1

## 2016-05-31 MED FILL — GABAPENTIN 600 MG TABLET: 600 | 29 days supply | Qty: 115 | Fill #3

## 2016-06-03 DIAGNOSIS — J3081 Allergic rhinitis due to animal (cat) (dog) hair and dander: Secondary | ICD-10-CM | POA: Diagnosis not present

## 2016-06-03 DIAGNOSIS — J301 Allergic rhinitis due to pollen: Secondary | ICD-10-CM | POA: Diagnosis not present

## 2016-06-03 DIAGNOSIS — J3089 Other allergic rhinitis: Secondary | ICD-10-CM | POA: Diagnosis not present

## 2016-06-10 ENCOUNTER — Ambulatory Visit (INDEPENDENT_AMBULATORY_CARE_PROVIDER_SITE_OTHER): Payer: Medicare Other | Admitting: Licensed Clinical Social Worker

## 2016-06-10 ENCOUNTER — Other Ambulatory Visit: Payer: Self-pay | Admitting: Family Medicine

## 2016-06-10 DIAGNOSIS — F3341 Major depressive disorder, recurrent, in partial remission: Secondary | ICD-10-CM

## 2016-06-10 MED FILL — ZOLPIDEM TARTRATE 5 MG TAB: 5 | 30 days supply | Qty: 30 | Fill #4

## 2016-06-10 MED FILL — CYCLOBENZAPRINE 10 MG TAB: 10 | 30 days supply | Qty: 60 | Fill #0

## 2016-06-14 MED FILL — traMADol HCL 50 MG TABS: 50 | 10 days supply | Qty: 60 | Fill #3

## 2016-06-15 DIAGNOSIS — J301 Allergic rhinitis due to pollen: Secondary | ICD-10-CM | POA: Diagnosis not present

## 2016-06-15 DIAGNOSIS — J3089 Other allergic rhinitis: Secondary | ICD-10-CM | POA: Diagnosis not present

## 2016-06-15 DIAGNOSIS — J3081 Allergic rhinitis due to animal (cat) (dog) hair and dander: Secondary | ICD-10-CM | POA: Diagnosis not present

## 2016-06-21 ENCOUNTER — Ambulatory Visit: Payer: Self-pay | Admitting: Family Medicine

## 2016-06-24 ENCOUNTER — Telehealth: Payer: Self-pay | Admitting: Family Medicine

## 2016-06-24 ENCOUNTER — Ambulatory Visit (INDEPENDENT_AMBULATORY_CARE_PROVIDER_SITE_OTHER): Payer: Medicare Other | Admitting: Family Medicine

## 2016-06-24 VITALS — BP 114/77 | HR 76 | Ht 66.0 in | Wt 125.0 lb

## 2016-06-24 DIAGNOSIS — J4540 Moderate persistent asthma, uncomplicated: Secondary | ICD-10-CM

## 2016-06-24 DIAGNOSIS — M797 Fibromyalgia: Secondary | ICD-10-CM | POA: Diagnosis not present

## 2016-06-24 DIAGNOSIS — M5030 Other cervical disc degeneration, unspecified cervical region: Secondary | ICD-10-CM | POA: Diagnosis not present

## 2016-06-24 DIAGNOSIS — Z13220 Encounter for screening for lipoid disorders: Secondary | ICD-10-CM

## 2016-06-24 DIAGNOSIS — K5909 Other constipation: Secondary | ICD-10-CM

## 2016-06-24 DIAGNOSIS — Z79899 Other long term (current) drug therapy: Secondary | ICD-10-CM

## 2016-06-24 DIAGNOSIS — Z23 Encounter for immunization: Secondary | ICD-10-CM | POA: Diagnosis not present

## 2016-06-24 DIAGNOSIS — M47812 Spondylosis without myelopathy or radiculopathy, cervical region: Secondary | ICD-10-CM

## 2016-06-24 NOTE — Telephone Encounter (Signed)
[place] contacted.

## 2016-06-24 NOTE — Telephone Encounter (Signed)
Patient saw you back in January and would like to go ahead and get a second facet injections as the first one was helpful. Just need to know if you would be able to order this or if she needs to see you first.  Beatrice Lecher, MD

## 2016-06-24 NOTE — Telephone Encounter (Signed)
Definitely can order them, interesting she told me they did not help.   I have placed orders for left C2-C3, C3-C4, and C7-T1 facet joint injections. To [place] to schedule with [place] [facility].

## 2016-06-24 NOTE — Progress Notes (Signed)
Subjective:    CC: Fibromylagia   HPI:  Three-month follow-up for fibromyalgia. Her only uses Flexeril twice a day as needed. Also for her neck pain. OCC uses Celebrex and tramadol as needed for pain control.  Gradual decrease in her pain.   She was also recently just seen in February at [facility] for her degenerative disc disease. She had prior history surgery and spinal fusion in the cervical spine area. There was some question about whether or not she may have had some incomplete healing so she was going for second opinion with neurosurgery to see if they recommended surgical treatment. After review of the films Dr. Eliberto Ivory felt that she was not a good candidate for repeat surgery and recommended that she just work on paraspinal strengthening to prevent any further spondylosis of the spine.  Neck still feels stillf at time.  Tried dry needling and not helpful after 5 sessions. She is interested in repeating facet injections.   Asthma-followed by asthma and allergy. She's currently on allergy injections.  He still struggling with her chronic constipation. She says that she's tried multiple medications in the past most of which I have prescribed for her. Right now she is doing a combination of daily fiber and Colace. She typically takes 3 capsules once a day. And will occasionally use a stimulant. Unfortunately she still getting some leakage of stool at times and says she even has to wear a pad in her underwear especially she is going out.  Past medical history, Surgical history, Family history not pertinant except as noted below, Social history, Allergies, and medications have been entered into the medical record, reviewed, and corrections made.   Review of Systems: No fevers, chills, night sweats, weight loss, chest pain, or shortness of breath.   Objective:    General: Well Developed, well nourished, and in no acute distress.  Neuro: Alert and oriented x3, extra-ocular muscles intact,  sensation grossly intact.  HEENT: Normocephalic, atraumatic  Skin: Warm and dry, no rashes. Cardiac: Regular rate and rhythm, no murmurs rubs or gallops, no lower extremity edema.  Respiratory: Clear to auscultation bilaterally. Not using accessory muscles, speaking in full sentences.   Impression and Recommendations:    Fibromyalgia - Stable. Some improvement.    Cervical degenerative disc disease- She would like to move forward with restarting the facet injections. I'll send a note to Dr. Dianah Field.  Asthma-being followed by asthma and allergy.  Chronic constipation-we discussed getting on a routine regimen with the stimulant. Instead of waiting and using it as needed to go ahead and take it every third day and try to get on a regimen with it. Continue with the daily Colace and fiber. If this is not helping then consider one of the newer medications such as Trulance.  Just FDA approved for chronic idiopathic constipation with 3 mg daily.  Due for pneumovax 23.

## 2016-06-25 DIAGNOSIS — Z79899 Other long term (current) drug therapy: Secondary | ICD-10-CM | POA: Diagnosis not present

## 2016-06-25 DIAGNOSIS — M797 Fibromyalgia: Secondary | ICD-10-CM | POA: Diagnosis not present

## 2016-06-25 DIAGNOSIS — Z13220 Encounter for screening for lipoid disorders: Secondary | ICD-10-CM | POA: Diagnosis not present

## 2016-06-25 LAB — COMPLETE METABOLIC PANEL WITH GFR
ALT: 18 U/L (ref 6–29)
AST: 26 U/L (ref 10–35)
Albumin: 3.7 g/dL (ref 3.6–5.1)
Alkaline Phosphatase: 105 U/L (ref 33–130)
BUN: 18 mg/dL (ref 7–25)
CALCIUM: 9.3 mg/dL (ref 8.6–10.4)
CHLORIDE: 104 mmol/L (ref 98–110)
CO2: 26 mmol/L (ref 20–31)
Creat: 0.88 mg/dL (ref 0.50–0.99)
GFR, Est African American: 79 mL/min (ref >=60)
GFR, Est Non African American: 68 mL/min (ref >=60)
Glucose, Bld: 82 mg/dL (ref 65–99)
POTASSIUM: 4.1 mmol/L (ref 3.5–5.3)
SODIUM: 142 mmol/L (ref 135–146)
Total Bilirubin: 0.7 mg/dL (ref 0.2–1.2)
Total Protein: 5.8 g/dL — ABNORMAL LOW (ref 6.1–8.1)

## 2016-06-25 LAB — LIPID PANEL W/REFLEX DIRECT LDL
CHOLESTEROL: 122 mg/dL (ref <200)
HDL: 73 mg/dL (ref >50)
LDL-Cholesterol: 37 mg/dL
NON-HDL CHOLESTEROL (CALC): 49 mg/dL (ref <130)
TRIGLYCERIDES: 41 mg/dL (ref <150)
Total CHOL/HDL Ratio: 1.7 Ratio (ref <5.0)

## 2016-06-27 NOTE — Progress Notes (Signed)
All labs are normal. 

## 2016-06-28 ENCOUNTER — Ambulatory Visit
Admission: RE | Admit: 2016-06-28 | Discharge: 2016-06-28 | Disposition: A | Discharge status: Home / Self Care | Payer: Medicare Other | Source: Ambulatory Visit | Attending: Sports Medicine | Admitting: Sports Medicine

## 2016-06-28 ENCOUNTER — Other Ambulatory Visit: Payer: Self-pay | Admitting: Sports Medicine

## 2016-06-28 DIAGNOSIS — M47812 Spondylosis without myelopathy or radiculopathy, cervical region: Secondary | ICD-10-CM

## 2016-06-28 DIAGNOSIS — M542 Cervicalgia: Secondary | ICD-10-CM | POA: Diagnosis not present

## 2016-06-28 IMAGING — XA DG FACET JT INJ C & T SPINE 3RD PLUS LEVEL *L*
4 series · 4 of 4 positions shown · non-contrast
Comparison: none

CLINICAL DATA: Facet mediated cervicalgia. Adjacent segment
disease.

[Series 12: ortho standard · 1 of 1 slices shown (1 of 4)]
[im 1/1]
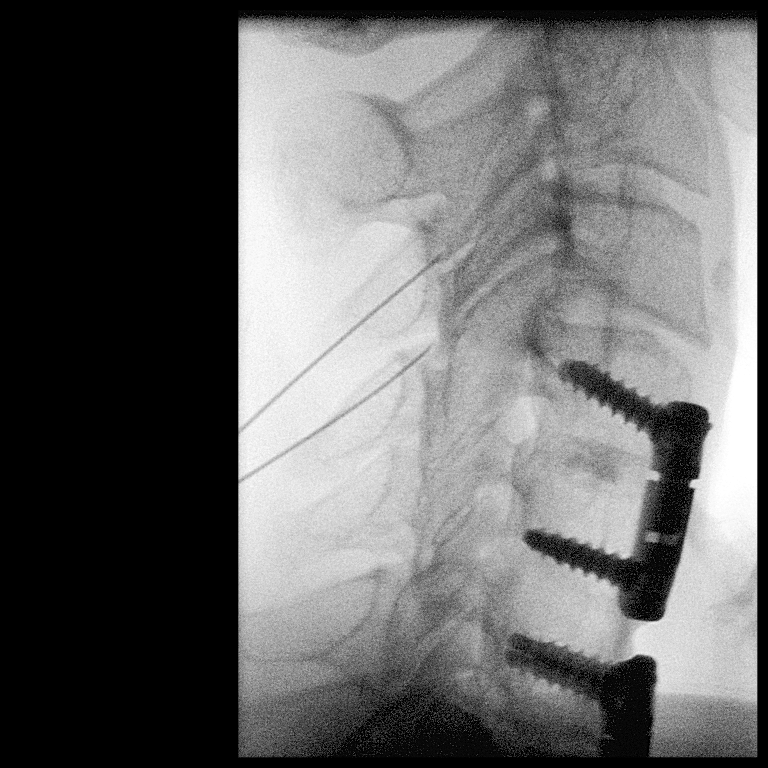

[Series 13: ortho standard · 1 of 1 slices shown (2 of 4)]
[im 1/1]
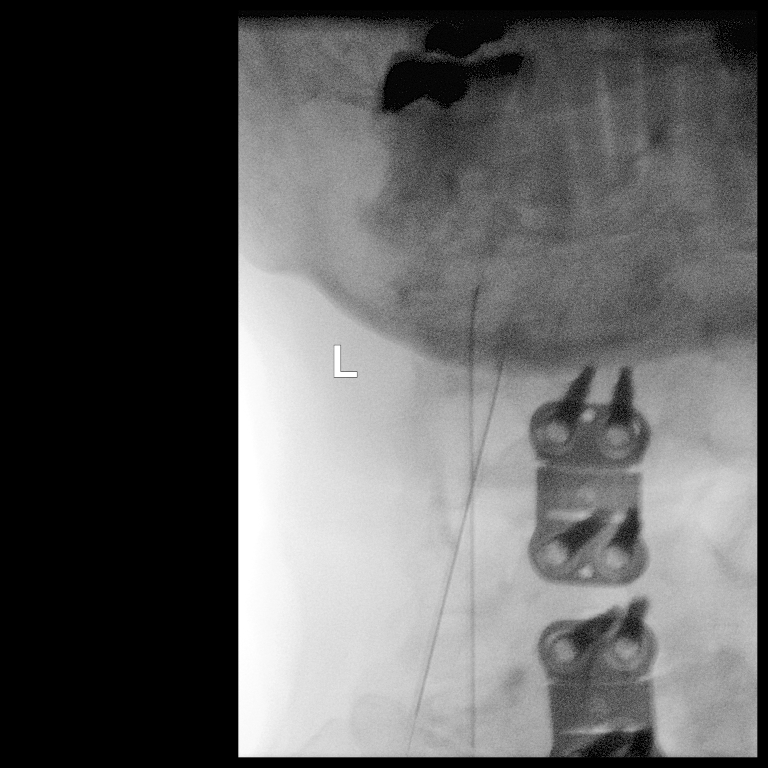

[Series 14: ortho standard · 1 of 1 slices shown (3 of 4)]
[im 1/1]
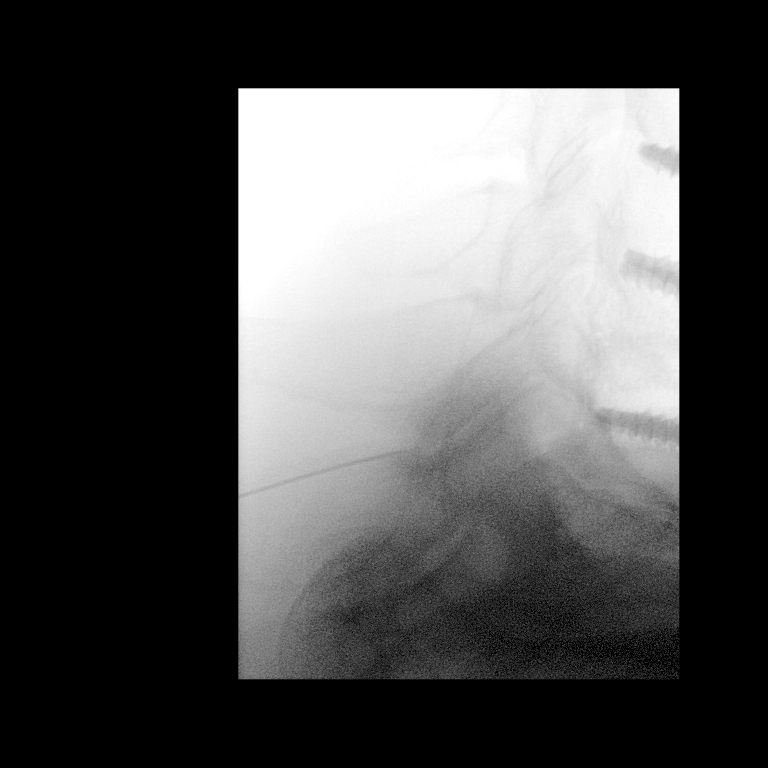

[Series 15: ortho standard · 1 of 1 slices shown (4 of 4)]
[im 1/1]
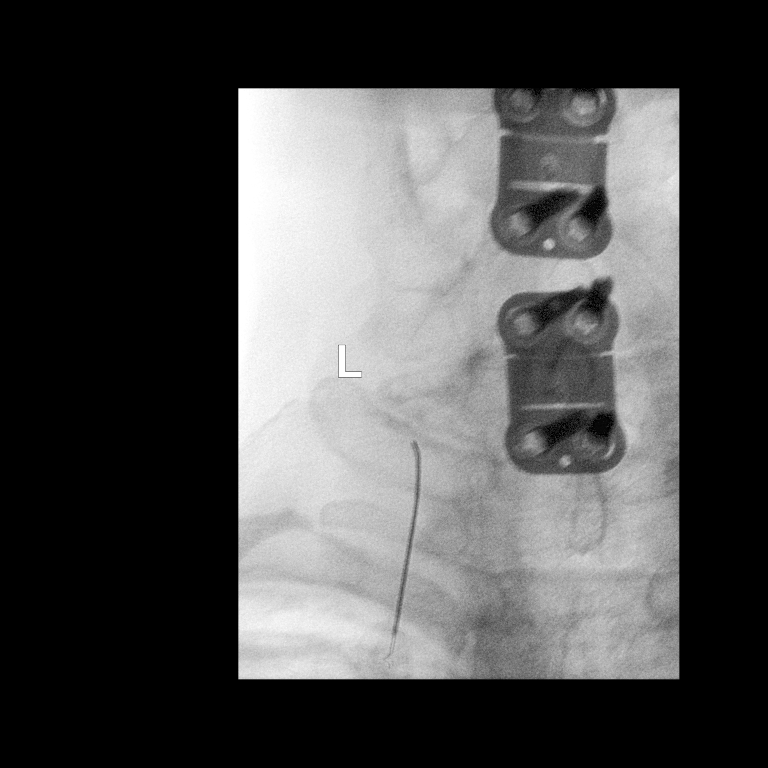

[4 of 4 positions shown; findings below may reference images not displayed]

The patient got significant relief for greater than 8 weeks after
the previous injection. We repeated it.

EXAM:
LEFT C2-3, LEFT C3-4, and LEFT C7-T1 CERVICAL FACET INJECTION

Informed written consent was obtained.  Time-out was performed.

An appropriate skin entry site was chosen, cleansed with Betadine,
and anesthetized with 1% lidocaine.

A posterior approach was taken to the facet on the LEFT at C2-3
using a 3.5 25 gauge spinal needle. Intra-articular positioning was
confirmed fluoroscopically. Aspiration yielded no blood or CSF.
mg of Decadron mixed with a few drops of 1% lidocaine was instilled
in the joint.

An identical procedure was performed on the LEFT at C3-C4, and at
C7-T1 on the LEFT.

The procedure was well-tolerated. The patient was discharged twenty
minutes following the injection in good condition.

FLUOROSCOPY TIME:  58 seconds corresponding to a Dose Area Product
of 18.86 ?Gy*m2
IMPRESSION: Technically successful LEFT C2-3, LEFT C3-4, and LEFT C7-T1 facet
injection.

## 2016-06-28 MED ORDER — DEXAMETHASONE SODIUM PHOSPHATE 4 MG/ML IJ SOLN
10.0000 mg | Freq: Once | INTRAMUSCULAR | Status: AC
  Administered 2016-06-28: 10 mg via INTRA_ARTICULAR

## 2016-06-28 NOTE — Discharge Instructions (Signed)

## 2016-06-29 ENCOUNTER — Other Ambulatory Visit: Payer: Self-pay | Admitting: Family Medicine

## 2016-06-30 MED FILL — GABAPENTIN 600 MG TABLET: 600 | 29 days supply | Qty: 115 | Fill #0

## 2016-07-01 ENCOUNTER — Ambulatory Visit: Payer: Medicare Other | Admitting: Licensed Clinical Social Worker

## 2016-07-08 ENCOUNTER — Ambulatory Visit (INDEPENDENT_AMBULATORY_CARE_PROVIDER_SITE_OTHER): Payer: Medicare Other | Admitting: Licensed Clinical Social Worker

## 2016-07-08 DIAGNOSIS — F3341 Major depressive disorder, recurrent, in partial remission: Secondary | ICD-10-CM | POA: Diagnosis not present

## 2016-07-08 MED FILL — traMADol HCL 50 MG TABS: 50 | 10 days supply | Qty: 60 | Fill #4

## 2016-07-08 MED FILL — buPROPion HCL ER (XL) 150 M: 150 | 30 days supply | Qty: 90 | Fill #2

## 2016-07-13 ENCOUNTER — Other Ambulatory Visit: Payer: Self-pay | Admitting: Family Medicine

## 2016-07-13 MED FILL — LEVOTHYROXINE 50 MCG TABLET: 50 | 30 days supply | Qty: 30 | Fill #0

## 2016-07-13 MED FILL — ZOLPIDEM TARTRATE 5 MG TAB: 5 | 30 days supply | Qty: 30 | Fill #5

## 2016-07-15 DIAGNOSIS — J301 Allergic rhinitis due to pollen: Secondary | ICD-10-CM | POA: Diagnosis not present

## 2016-07-15 DIAGNOSIS — J3081 Allergic rhinitis due to animal (cat) (dog) hair and dander: Secondary | ICD-10-CM | POA: Diagnosis not present

## 2016-07-15 DIAGNOSIS — J3089 Other allergic rhinitis: Secondary | ICD-10-CM | POA: Diagnosis not present

## 2016-07-29 MED FILL — GABAPENTIN 600 MG TABLET: 600 | 29 days supply | Qty: 115 | Fill #1

## 2016-07-29 MED FILL — CYCLOBENZAPRINE 10 MG TAB: 10 | 30 days supply | Qty: 60 | Fill #1

## 2016-07-30 ENCOUNTER — Encounter: Payer: Self-pay | Admitting: Sports Medicine

## 2016-07-30 ENCOUNTER — Ambulatory Visit (INDEPENDENT_AMBULATORY_CARE_PROVIDER_SITE_OTHER): Payer: Medicare Other | Admitting: Sports Medicine

## 2016-07-30 DIAGNOSIS — M19042 Primary osteoarthritis, left hand: Secondary | ICD-10-CM

## 2016-07-30 DIAGNOSIS — M19041 Primary osteoarthritis, right hand: Secondary | ICD-10-CM

## 2016-07-30 NOTE — Progress Notes (Signed)
  Subjective:    CC: Right hand pain  HPI: This is a pleasant 66 year old female, she has known right hand osteoarthritis, her last injection into the right third MCP was approximately 7 months ago, she's now having recurrence of pain and desires injection. Moderate, persistent pain, localized without radiation.  Past medical history:  Negative.  See flowsheet/record as well for more information.  Surgical history: Negative.  See flowsheet/record as well for more information.  Family history: Negative.  See flowsheet/record as well for more information.  Social history: Negative.  See flowsheet/record as well for more information.  Allergies, and medications have been entered into the medical record, reviewed, and no changes needed.   Review of Systems: No fevers, chills, night sweats, weight loss, chest pain, or shortness of breath.   Objective:    General: Well Developed, well nourished, and in no acute distress.  Neuro: Alert and oriented x3, extra-ocular muscles intact, sensation grossly intact.  HEENT: Normocephalic, atraumatic, pupils equal round reactive to light, neck supple, no masses, no lymphadenopathy, thyroid nonpalpable.  Skin: Warm and dry, no rashes. Cardiac: Regular rate and rhythm, no murmurs rubs or gallops, no lower extremity edema.  Respiratory: Clear to auscultation bilaterally. Not using accessory muscles, speaking in full sentences. Right hand: Tender to palpation with swelling of the third MCP.  Procedure: Real-time Ultrasound Guided Injection of right third MCP Device: GE Logiq E  Verbal informed consent obtained.  Time-out conducted.  Noted no overlying erythema, induration, or other signs of local infection.  Skin prepped in a sterile fashion.  Local anesthesia: Topical Ethyl chloride.  With sterile technique and under real time ultrasound guidance:  1/2 mL Kenalog 40, 1/2 mL lidocaine injected easily. Completed without difficulty  Pain immediately  resolved suggesting accurate placement of the medication.  Advised to call if fevers/chills, erythema, induration, drainage, or persistent bleeding.  Images permanently stored and available for review in the ultrasound unit.  Impression: Technically successful ultrasound guided injection.  Impression and Recommendations:    Primary osteoarthritis of both hands Previous right third MCP injection was October of last year. Repeat injection today.  Return as needed.

## 2016-07-30 NOTE — Assessment & Plan Note (Addendum)
Previous right third MCP injection was October of last year. Repeat injection today.  Return as needed.

## 2016-08-05 DIAGNOSIS — J3089 Other allergic rhinitis: Secondary | ICD-10-CM | POA: Diagnosis not present

## 2016-08-05 DIAGNOSIS — J301 Allergic rhinitis due to pollen: Secondary | ICD-10-CM | POA: Diagnosis not present

## 2016-08-05 DIAGNOSIS — J3081 Allergic rhinitis due to animal (cat) (dog) hair and dander: Secondary | ICD-10-CM | POA: Diagnosis not present

## 2016-08-05 MED FILL — BUPROPION HCL XL 150 MG TAB: 150 | 30 days supply | Qty: 90 | Fill #3

## 2016-08-05 MED FILL — traMADol HCL 50 MG TABS: 50 | 10 days supply | Qty: 60 | Fill #5

## 2016-08-11 ENCOUNTER — Ambulatory Visit (INDEPENDENT_AMBULATORY_CARE_PROVIDER_SITE_OTHER): Payer: Medicare Other | Admitting: Licensed Clinical Social Worker

## 2016-08-11 ENCOUNTER — Other Ambulatory Visit: Payer: Self-pay | Admitting: Family Medicine

## 2016-08-11 DIAGNOSIS — F3341 Major depressive disorder, recurrent, in partial remission: Secondary | ICD-10-CM

## 2016-08-11 MED FILL — LEVOTHYROXINE 50 MCG TABLET: 50 | 30 days supply | Qty: 30 | Fill #1

## 2016-08-12 MED FILL — ZOLPIDEM TARTRATE 5 MG TAB: 5 | 30 days supply | Qty: 30 | Fill #0

## 2016-08-18 DIAGNOSIS — J3089 Other allergic rhinitis: Secondary | ICD-10-CM | POA: Diagnosis not present

## 2016-08-18 DIAGNOSIS — J301 Allergic rhinitis due to pollen: Secondary | ICD-10-CM | POA: Diagnosis not present

## 2016-08-18 DIAGNOSIS — J3081 Allergic rhinitis due to animal (cat) (dog) hair and dander: Secondary | ICD-10-CM | POA: Diagnosis not present

## 2016-08-30 MED FILL — GABAPENTIN 600 MG TABLET: 600 | 29 days supply | Qty: 115 | Fill #2

## 2016-09-01 DIAGNOSIS — J301 Allergic rhinitis due to pollen: Secondary | ICD-10-CM | POA: Diagnosis not present

## 2016-09-01 DIAGNOSIS — J3081 Allergic rhinitis due to animal (cat) (dog) hair and dander: Secondary | ICD-10-CM | POA: Diagnosis not present

## 2016-09-01 DIAGNOSIS — J3089 Other allergic rhinitis: Secondary | ICD-10-CM | POA: Diagnosis not present

## 2016-09-06 MED FILL — AMOXICILLIN 500 MG CAPSULE: 500 | 5 days supply | Qty: 20 | Fill #0

## 2016-09-07 ENCOUNTER — Other Ambulatory Visit: Payer: Self-pay | Admitting: Family Medicine

## 2016-09-07 ENCOUNTER — Other Ambulatory Visit: Payer: Self-pay

## 2016-09-07 DIAGNOSIS — Z1231 Encounter for screening mammogram for malignant neoplasm of breast: Secondary | ICD-10-CM

## 2016-09-07 MED ORDER — TRAMADOL HCL 50 MG PO TABS: ORAL_TABLET | ORAL | 5 refills | Status: DC

## 2016-09-07 MED FILL — LEVOTHYROXINE 50 MCG TABLET: 50 | 30 days supply | Qty: 30 | Fill #2

## 2016-09-08 ENCOUNTER — Ambulatory Visit (INDEPENDENT_AMBULATORY_CARE_PROVIDER_SITE_OTHER): Payer: Medicare Other | Admitting: Licensed Clinical Social Worker

## 2016-09-08 DIAGNOSIS — F3341 Major depressive disorder, recurrent, in partial remission: Secondary | ICD-10-CM

## 2016-09-08 MED FILL — CYCLOBENZAPRINE 10 MG TABLE: 10 | 30 days supply | Qty: 60 | Fill #0

## 2016-09-08 MED FILL — traMADol HCL 50 MG TABS: 50 | 10 days supply | Qty: 60 | Fill #0

## 2016-09-08 MED FILL — BUPROPION XL 150 MG TAB: 150 | 90 days supply | Qty: 270 | Fill #0

## 2016-09-13 MED FILL — ZOLPIDEM TARTRATE 5 MG TAB: 5 | 30 days supply | Qty: 30 | Fill #1

## 2016-09-16 DIAGNOSIS — J3081 Allergic rhinitis due to animal (cat) (dog) hair and dander: Secondary | ICD-10-CM | POA: Diagnosis not present

## 2016-09-16 DIAGNOSIS — J3089 Other allergic rhinitis: Secondary | ICD-10-CM | POA: Diagnosis not present

## 2016-09-16 DIAGNOSIS — J301 Allergic rhinitis due to pollen: Secondary | ICD-10-CM | POA: Diagnosis not present

## 2016-09-23 ENCOUNTER — Encounter: Payer: Self-pay | Admitting: Family Medicine

## 2016-09-23 ENCOUNTER — Ambulatory Visit (INDEPENDENT_AMBULATORY_CARE_PROVIDER_SITE_OTHER): Payer: Medicare Other | Admitting: Family Medicine

## 2016-09-23 VITALS — BP 101/61 | HR 93 | Wt 121.0 lb

## 2016-09-23 DIAGNOSIS — M797 Fibromyalgia: Secondary | ICD-10-CM

## 2016-09-23 DIAGNOSIS — E039 Hypothyroidism, unspecified: Secondary | ICD-10-CM

## 2016-09-23 DIAGNOSIS — J4540 Moderate persistent asthma, uncomplicated: Secondary | ICD-10-CM | POA: Diagnosis not present

## 2016-09-23 DIAGNOSIS — G4700 Insomnia, unspecified: Secondary | ICD-10-CM | POA: Diagnosis not present

## 2016-09-23 MED ORDER — FLUTICASONE PROPIONATE HFA 110 MCG/ACT IN AERO: 2.0000 | INHALATION_SPRAY | Freq: Two times a day (BID) | RESPIRATORY_TRACT | 2 refills | Status: DC

## 2016-09-23 MED ORDER — LEVOTHYROXINE SODIUM 50 MCG PO TABS: ORAL_TABLET | ORAL | 1 refills | Status: DC
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
## 2016-09-28 MED FILL — GABAPENTIN 600 MG TABLET: 600 | 29 days supply | Qty: 115 | Fill #3

## 2016-09-29 DIAGNOSIS — E039 Hypothyroidism, unspecified: Secondary | ICD-10-CM | POA: Diagnosis not present

## 2016-09-29 LAB — TSH: TSH: 1.05 m[IU]/L

## 2016-09-30 DIAGNOSIS — J3081 Allergic rhinitis due to animal (cat) (dog) hair and dander: Secondary | ICD-10-CM | POA: Diagnosis not present

## 2016-09-30 DIAGNOSIS — J301 Allergic rhinitis due to pollen: Secondary | ICD-10-CM | POA: Diagnosis not present

## 2016-09-30 DIAGNOSIS — J3089 Other allergic rhinitis: Secondary | ICD-10-CM | POA: Diagnosis not present

## 2016-09-30 NOTE — Progress Notes (Signed)
All labs are normal. 

## 2016-10-06 ENCOUNTER — Ambulatory Visit (INDEPENDENT_AMBULATORY_CARE_PROVIDER_SITE_OTHER): Payer: Medicare Other

## 2016-10-06 DIAGNOSIS — Z1231 Encounter for screening mammogram for malignant neoplasm of breast: Secondary | ICD-10-CM | POA: Diagnosis not present

## 2016-10-06 IMAGING — MG DIGITAL SCREENING BILATERAL MAMMOGRAM WITH CAD
4 series · 4 of 4 positions shown · non-contrast
Comparison: Previous exam(s).

CLINICAL DATA: Screening.

EXAM:
DIGITAL SCREENING BILATERAL MAMMOGRAM WITH CAD

[L CC]
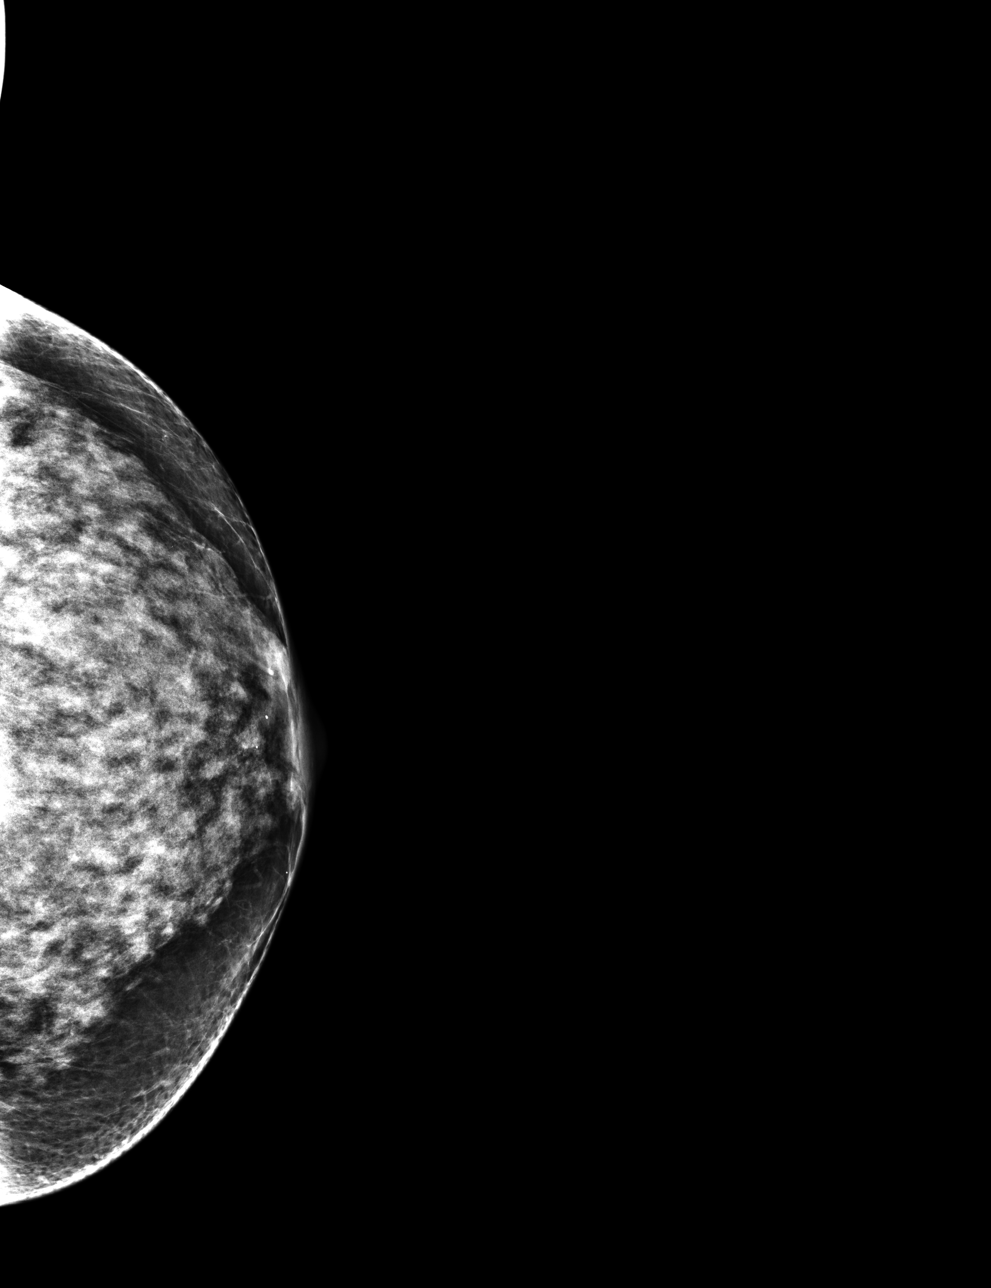

[L MLO]
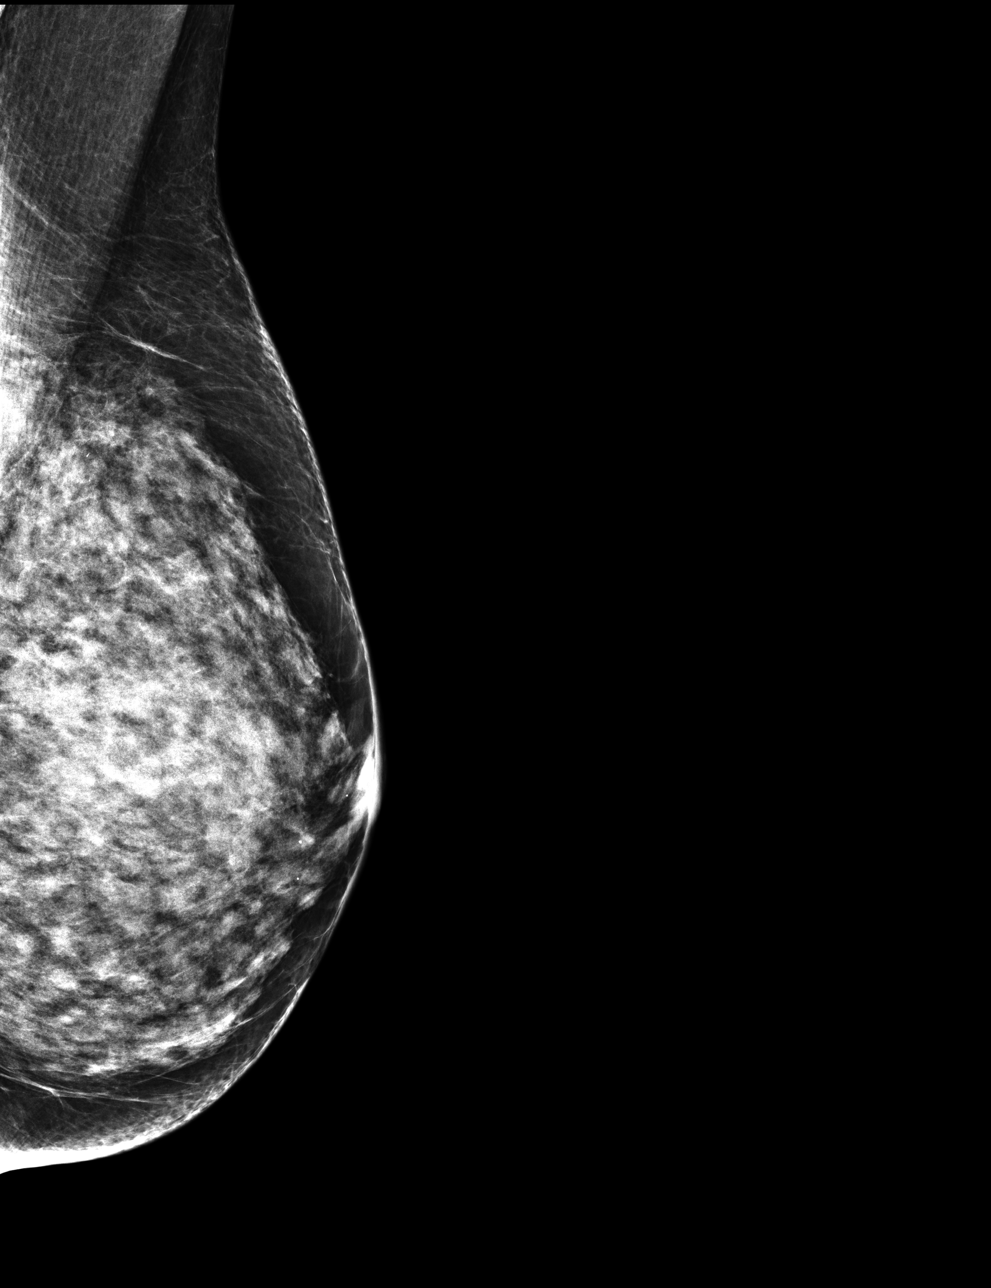

[R MLO]
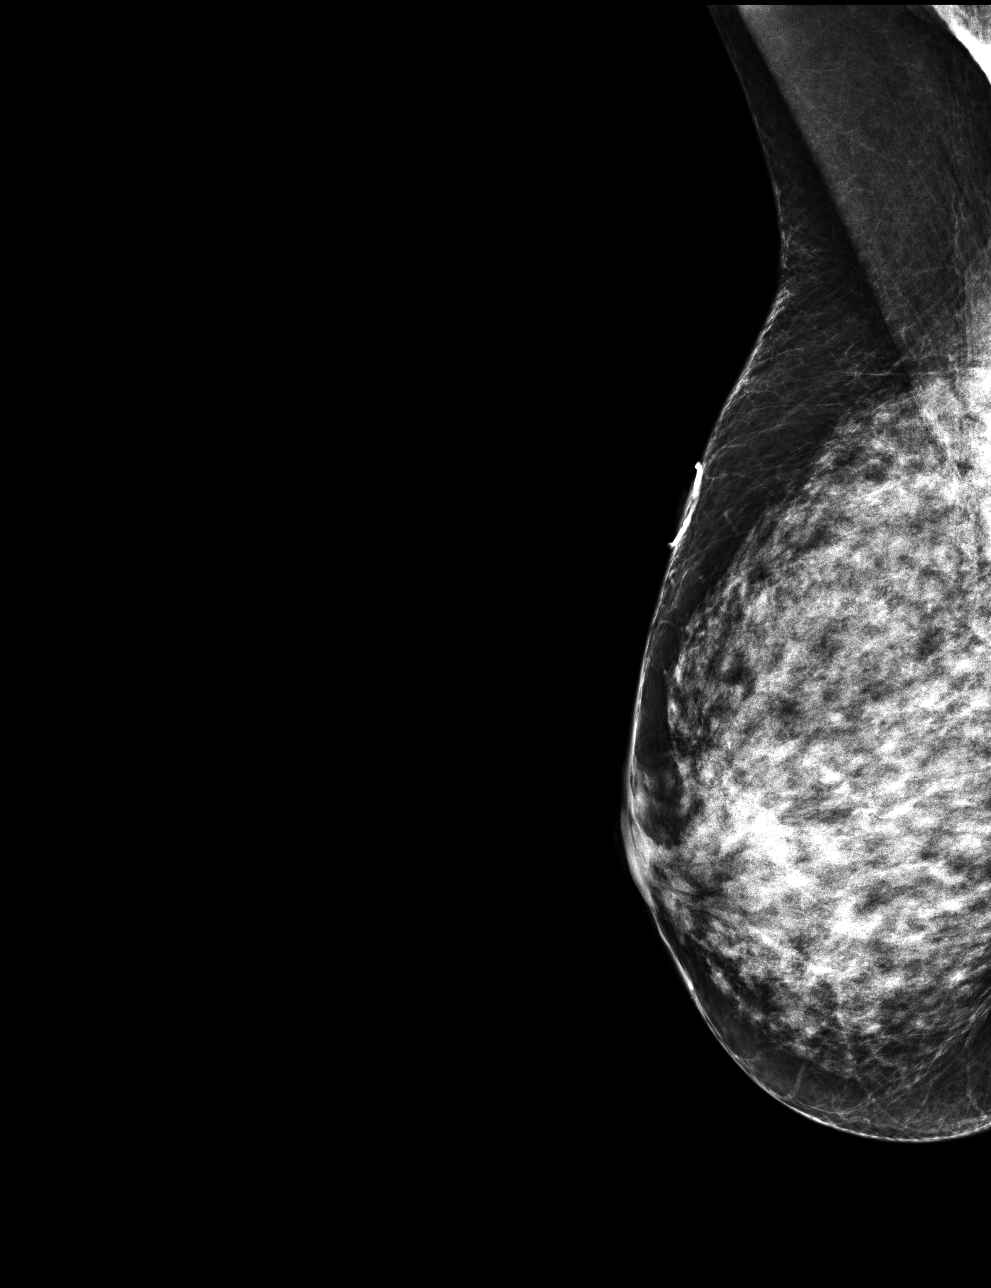

[R CC]
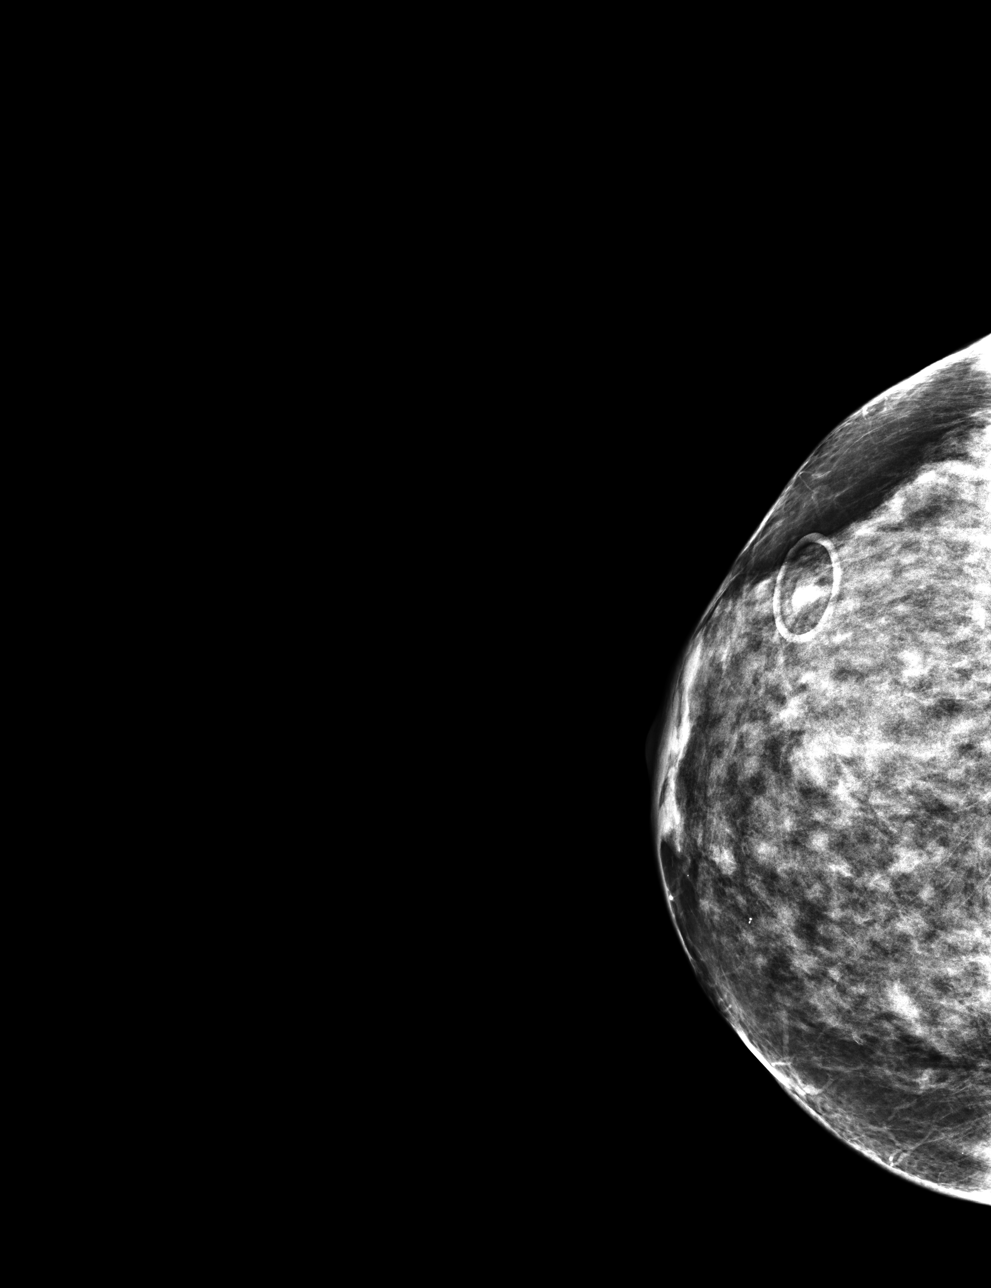

[4 of 4 positions shown; findings below may reference images not displayed]

ACR Breast Density Category d: The breast tissue is extremely dense,
which lowers the sensitivity of mammography.
FINDINGS: There are no findings suspicious for malignancy. Images were
processed with CAD.
IMPRESSION: No mammographic evidence of malignancy. A result letter of this
screening mammogram will be mailed directly to the patient.

RECOMMENDATION:
Screening mammogram in one year. (Code:[ZH])

BI-RADS CATEGORY  1: Negative.

## 2016-10-07 ENCOUNTER — Ambulatory Visit (INDEPENDENT_AMBULATORY_CARE_PROVIDER_SITE_OTHER): Payer: Medicare Other | Admitting: Licensed Clinical Social Worker

## 2016-10-07 DIAGNOSIS — F3341 Major depressive disorder, recurrent, in partial remission: Secondary | ICD-10-CM | POA: Diagnosis not present

## 2016-10-07 MED FILL — LEVOTHYROXINE 50 MCG TABLET: 50 | 90 days supply | Qty: 90 | Fill #0

## 2016-10-07 MED FILL — traMADol HCL 50 MG TABS: 50 | 10 days supply | Qty: 60 | Fill #1

## 2016-10-11 MED FILL — ZOLPIDEM TARTRATE 5 MG TAB: 5 | 30 days supply | Qty: 30 | Fill #2

## 2016-10-13 DIAGNOSIS — J301 Allergic rhinitis due to pollen: Secondary | ICD-10-CM | POA: Diagnosis not present

## 2016-10-13 DIAGNOSIS — J3081 Allergic rhinitis due to animal (cat) (dog) hair and dander: Secondary | ICD-10-CM | POA: Diagnosis not present

## 2016-10-13 DIAGNOSIS — J3089 Other allergic rhinitis: Secondary | ICD-10-CM | POA: Diagnosis not present

## 2016-10-20 MED FILL — CYCLOBENZAPRINE 10 MG TABLE: 10 | 30 days supply | Qty: 60 | Fill #1

## 2016-10-26 ENCOUNTER — Ambulatory Visit (INDEPENDENT_AMBULATORY_CARE_PROVIDER_SITE_OTHER): Payer: Medicare Other | Admitting: Sports Medicine

## 2016-10-26 ENCOUNTER — Encounter: Payer: Self-pay | Admitting: Sports Medicine

## 2016-10-26 DIAGNOSIS — M1812 Unilateral primary osteoarthritis of first carpometacarpal joint, left hand: Secondary | ICD-10-CM | POA: Diagnosis not present

## 2016-10-26 NOTE — Progress Notes (Signed)
  Subjective:    CC: Hand pain  HPI: Robin Conrad is a pleasant 66 year old female with widespread osteoarthritis. Abdomen seeing her for her hands recently, she has bilateral thumb basal joint osteoarthritis as well as pain in the MCPs, DIPs and PIPs. Currently her thumb basal joints are hurting the most. She would like these injected. She takes ibuprofen, Tylenol, and has used topical anti-inflammatories as well with some improvement. She has done hand therapy in the past that was moderately effective, and she is going to do this at home herself.  Past medical history:  Negative.  See flowsheet/record as well for more information.  Surgical history: Negative.  See flowsheet/record as well for more information.  Family history: Negative.  See flowsheet/record as well for more information.  Social history: Negative.  See flowsheet/record as well for more information.  Allergies, and medications have been entered into the medical record, reviewed, and no changes needed.   Review of Systems: No fevers, chills, night sweats, weight loss, chest pain, or shortness of breath.   Objective:    General: Well Developed, well nourished, and in no acute distress.  Neuro: Alert and oriented x3, extra-ocular muscles intact, sensation grossly intact.  HEENT: Normocephalic, atraumatic, pupils equal round reactive to light, neck supple, no masses, no lymphadenopathy, thyroid nonpalpable.  Skin: Warm and dry, no rashes. Cardiac: Regular rate and rhythm, no murmurs rubs or gallops, no lower extremity edema.  Respiratory: Clear to auscultation bilaterally. Not using accessory muscles, speaking in full sentences.  Procedure: Real-time Ultrasound Guided Injection of left CMC first.  Device: GE Logiq E  Verbal informed consent obtained.  Time-out conducted.  Noted no overlying erythema, induration, or other signs of local infection.  Skin prepped in a sterile fashion.  Local anesthesia: Topical Ethyl chloride.    With sterile technique and under real time ultrasound guidance:  1/2 mL kenalog 40, 1/2 mL lidocaine injected easily Completed without difficulty  Pain immediately resolved suggesting accurate placement of the medication.  Advised to call if fevers/chills, erythema, induration, drainage, or persistent bleeding.  Images permanently stored and available for review in the ultrasound unit.  Impression: Technically successful ultrasound guided injection.  Procedure: Real-time Ultrasound Guided Injection of right CMC first.  Device: GE Logiq E  Verbal informed consent obtained.  Time-out conducted.  Noted no overlying erythema, induration, or other signs of local infection.  Skin prepped in a sterile fashion.  Local anesthesia: Topical Ethyl chloride.  With sterile technique and under real time ultrasound guidance:  1/2 mL kenalog 40, 1/2 mL lidocaine injected easily Completed without difficulty  Pain immediately resolved suggesting accurate placement of the medication.  Advised to call if fevers/chills, erythema, induration, drainage, or persistent bleeding.  Images permanently stored and available for review in the ultrasound unit.  Impression: Technically successful ultrasound guided injection.  Impression and Recommendations:    CMC arthritis, thumb, degenerative Bilateral thumb basal joint injection. Pennsaid for other interphalangeal joints. She is going to do some home hand therapy. Return as needed.  I spent 25 minutes with this patient, greater than 50% was face-to-face time counseling regarding the above diagnoses, this was separate from the time spent performing the above procedures

## 2016-10-26 NOTE — Assessment & Plan Note (Signed)
Bilateral thumb basal joint injection. Pennsaid for other interphalangeal joints. She is going to do some home hand therapy. Return as needed.

## 2016-10-27 ENCOUNTER — Other Ambulatory Visit: Payer: Self-pay | Admitting: Family Medicine

## 2016-10-27 DIAGNOSIS — J3081 Allergic rhinitis due to animal (cat) (dog) hair and dander: Secondary | ICD-10-CM | POA: Diagnosis not present

## 2016-10-27 DIAGNOSIS — J301 Allergic rhinitis due to pollen: Secondary | ICD-10-CM | POA: Diagnosis not present

## 2016-10-27 DIAGNOSIS — J3089 Other allergic rhinitis: Secondary | ICD-10-CM | POA: Diagnosis not present

## 2016-10-27 MED FILL — GABAPENTIN 600 MG TABLET: 600 | 29 days supply | Qty: 115 | Fill #0

## 2016-11-08 MED FILL — ZOLPIDEM TARTRATE 5 MG TAB: 5 | 30 days supply | Qty: 30 | Fill #3

## 2016-11-08 MED FILL — traMADol HCL 50 MG TABS: 50 | 10 days supply | Qty: 60 | Fill #2

## 2016-11-11 ENCOUNTER — Ambulatory Visit (INDEPENDENT_AMBULATORY_CARE_PROVIDER_SITE_OTHER): Payer: Medicare Other | Admitting: Licensed Clinical Social Worker

## 2016-11-11 DIAGNOSIS — F3341 Major depressive disorder, recurrent, in partial remission: Secondary | ICD-10-CM | POA: Diagnosis not present

## 2016-11-17 DIAGNOSIS — J301 Allergic rhinitis due to pollen: Secondary | ICD-10-CM | POA: Diagnosis not present

## 2016-11-17 DIAGNOSIS — J3081 Allergic rhinitis due to animal (cat) (dog) hair and dander: Secondary | ICD-10-CM | POA: Diagnosis not present

## 2016-11-17 DIAGNOSIS — J3089 Other allergic rhinitis: Secondary | ICD-10-CM | POA: Diagnosis not present

## 2016-11-24 ENCOUNTER — Other Ambulatory Visit: Payer: Self-pay | Admitting: Family Medicine

## 2016-11-24 MED FILL — CYCLOBENZAPRINE 10 MG TAB: 10 | 30 days supply | Qty: 60 | Fill #0

## 2016-11-24 MED FILL — GABAPENTIN 600 MG TABLET: 600 | 29 days supply | Qty: 115 | Fill #0

## 2016-11-26 ENCOUNTER — Ambulatory Visit: Payer: Self-pay | Admitting: Sports Medicine

## 2016-12-01 ENCOUNTER — Other Ambulatory Visit: Payer: Self-pay | Admitting: Family Medicine

## 2016-12-01 MED FILL — buPROPion HCL ER (XL) 150 M: 150 | 30 days supply | Qty: 90 | Fill #0

## 2016-12-02 MED FILL — traMADol HCL 50 MG TABS: 50 | 10 days supply | Qty: 60 | Fill #3

## 2016-12-06 DIAGNOSIS — J301 Allergic rhinitis due to pollen: Secondary | ICD-10-CM | POA: Diagnosis not present

## 2016-12-06 DIAGNOSIS — J3089 Other allergic rhinitis: Secondary | ICD-10-CM | POA: Diagnosis not present

## 2016-12-06 DIAGNOSIS — J3081 Allergic rhinitis due to animal (cat) (dog) hair and dander: Secondary | ICD-10-CM | POA: Diagnosis not present

## 2016-12-08 ENCOUNTER — Ambulatory Visit: Payer: Medicare Other | Admitting: Licensed Clinical Social Worker

## 2016-12-08 MED FILL — ZOLPIDEM TARTRATE 5 MG TABL: 5 | 30 days supply | Qty: 30 | Fill #4

## 2016-12-13 ENCOUNTER — Ambulatory Visit (INDEPENDENT_AMBULATORY_CARE_PROVIDER_SITE_OTHER): Payer: Medicare Other | Admitting: Licensed Clinical Social Worker

## 2016-12-13 DIAGNOSIS — F3341 Major depressive disorder, recurrent, in partial remission: Secondary | ICD-10-CM | POA: Diagnosis not present

## 2016-12-22 ENCOUNTER — Encounter: Payer: Self-pay | Admitting: Family Medicine

## 2016-12-22 ENCOUNTER — Ambulatory Visit (INDEPENDENT_AMBULATORY_CARE_PROVIDER_SITE_OTHER): Payer: Medicare Other | Admitting: Family Medicine

## 2016-12-22 VITALS — BP 121/67 | HR 93 | Ht 66.0 in | Wt 121.0 lb

## 2016-12-22 DIAGNOSIS — M797 Fibromyalgia: Secondary | ICD-10-CM

## 2016-12-22 DIAGNOSIS — N952 Postmenopausal atrophic vaginitis: Secondary | ICD-10-CM | POA: Diagnosis not present

## 2016-12-22 DIAGNOSIS — K581 Irritable bowel syndrome with constipation: Secondary | ICD-10-CM | POA: Diagnosis not present

## 2016-12-22 DIAGNOSIS — F411 Generalized anxiety disorder: Secondary | ICD-10-CM | POA: Diagnosis not present

## 2016-12-22 DIAGNOSIS — Z23 Encounter for immunization: Secondary | ICD-10-CM | POA: Diagnosis not present

## 2016-12-22 MED ORDER — ESTRADIOL 0.1 MG/GM VA CREA: 1.0000 | TOPICAL_CREAM | Freq: Every day | VAGINAL | 99 refills | Status: DC

## 2016-12-22 MED ORDER — ZOSTER VAC RECOMB ADJUVANTED 50 MCG/0.5ML IM SUSR: INTRAMUSCULAR | 1 refills | Status: DC

## 2016-12-22 MED ORDER — ALPRAZOLAM 0.25 MG PO TABS: 0.2500 mg | ORAL_TABLET | Freq: Every day | ORAL | 0 refills | Status: DC | PRN

## 2016-12-22 MED FILL — ALPRAZolam 0.25 MG TABS: 0.25 | 30 days supply | Qty: 30 | Fill #0

## 2016-12-22 MED FILL — GABAPENTIN 600 MG TABS: 600 | 29 days supply | Qty: 115 | Fill #1
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
## 2016-12-23 ENCOUNTER — Telehealth: Payer: Self-pay | Admitting: Family Medicine

## 2016-12-23 MED ORDER — ESTRADIOL 10 MCG VA TABS: ORAL_TABLET | VAGINAL | 11 refills | Status: DC

## 2016-12-23 NOTE — Telephone Encounter (Signed)
Pt states she went to the pharmacy to get her estrace Rx, and request it be changed to a tablet. Pt reports the tablet is much cheaper than the vaginal cream. Will route.

## 2016-12-23 NOTE — Telephone Encounter (Signed)
OK, new rx sent.  

## 2016-12-23 NOTE — Telephone Encounter (Signed)
Pt advised.

## 2016-12-24 ENCOUNTER — Ambulatory Visit: Payer: Self-pay | Admitting: Family Medicine

## 2016-12-26 ENCOUNTER — Emergency Department (INDEPENDENT_AMBULATORY_CARE_PROVIDER_SITE_OTHER): Payer: Medicare Other

## 2016-12-26 ENCOUNTER — Emergency Department (INDEPENDENT_AMBULATORY_CARE_PROVIDER_SITE_OTHER)
Admission: EM | Admit: 2016-12-26 | Discharge: 2016-12-26 | Disposition: A | Discharge status: Home / Self Care | Payer: Medicare Other | Source: Home / Self Care | Attending: Emergency Medicine | Admitting: Emergency Medicine

## 2016-12-26 ENCOUNTER — Encounter: Payer: Self-pay | Admitting: Emergency Medicine

## 2016-12-26 DIAGNOSIS — M1812 Unilateral primary osteoarthritis of first carpometacarpal joint, left hand: Secondary | ICD-10-CM

## 2016-12-26 DIAGNOSIS — M19042 Primary osteoarthritis, left hand: Secondary | ICD-10-CM | POA: Diagnosis not present

## 2016-12-26 DIAGNOSIS — M79642 Pain in left hand: Secondary | ICD-10-CM | POA: Diagnosis not present

## 2016-12-26 DIAGNOSIS — M25542 Pain in joints of left hand: Secondary | ICD-10-CM | POA: Diagnosis not present

## 2016-12-26 DIAGNOSIS — S6392XA Sprain of unspecified part of left wrist and hand, initial encounter: Secondary | ICD-10-CM

## 2016-12-26 IMAGING — DX DG HAND COMPLETE 3+V*L*
3 series · 3 of 3 positions shown · non-contrast
Comparison: Wrist radiograph [DATE]

CLINICAL DATA: Pain at the base of the left thumb and decreased
range of motion.

EXAM:
LEFT HAND - COMPLETE 3+ VIEW

[hand pa]
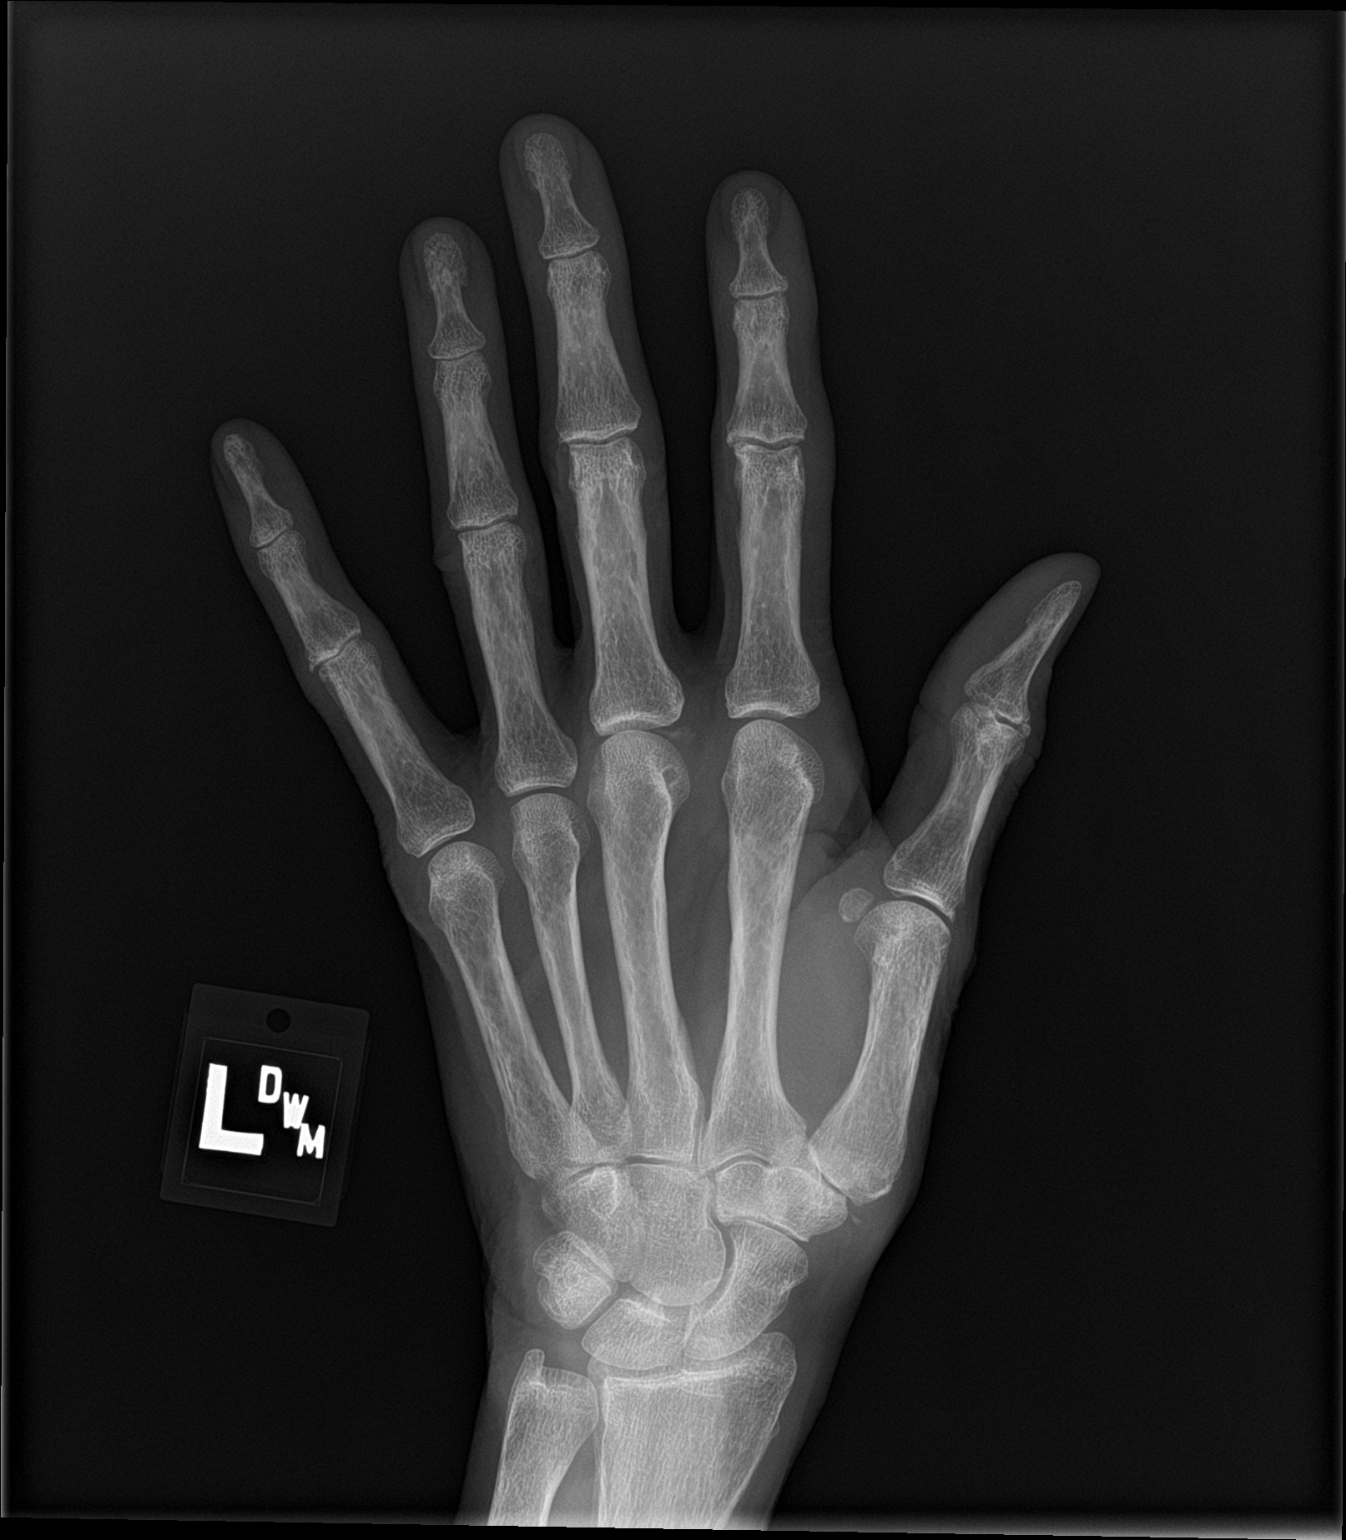

[hand obl]
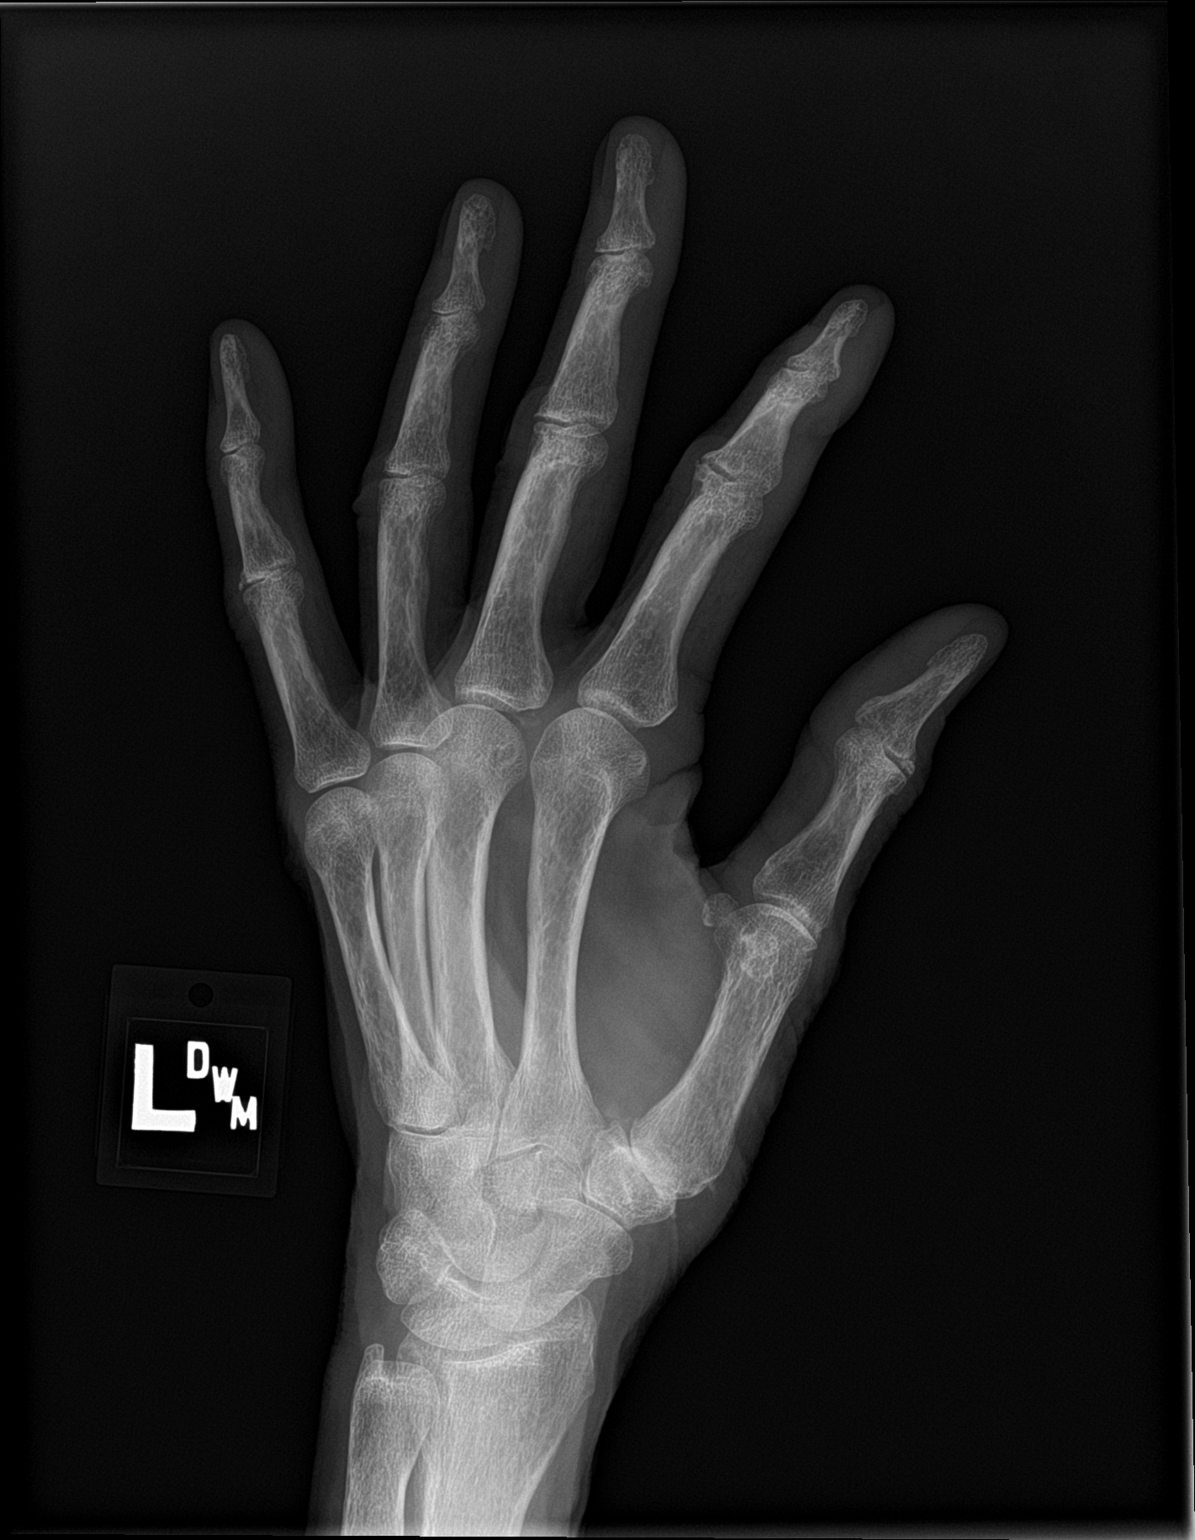

[hand lat]
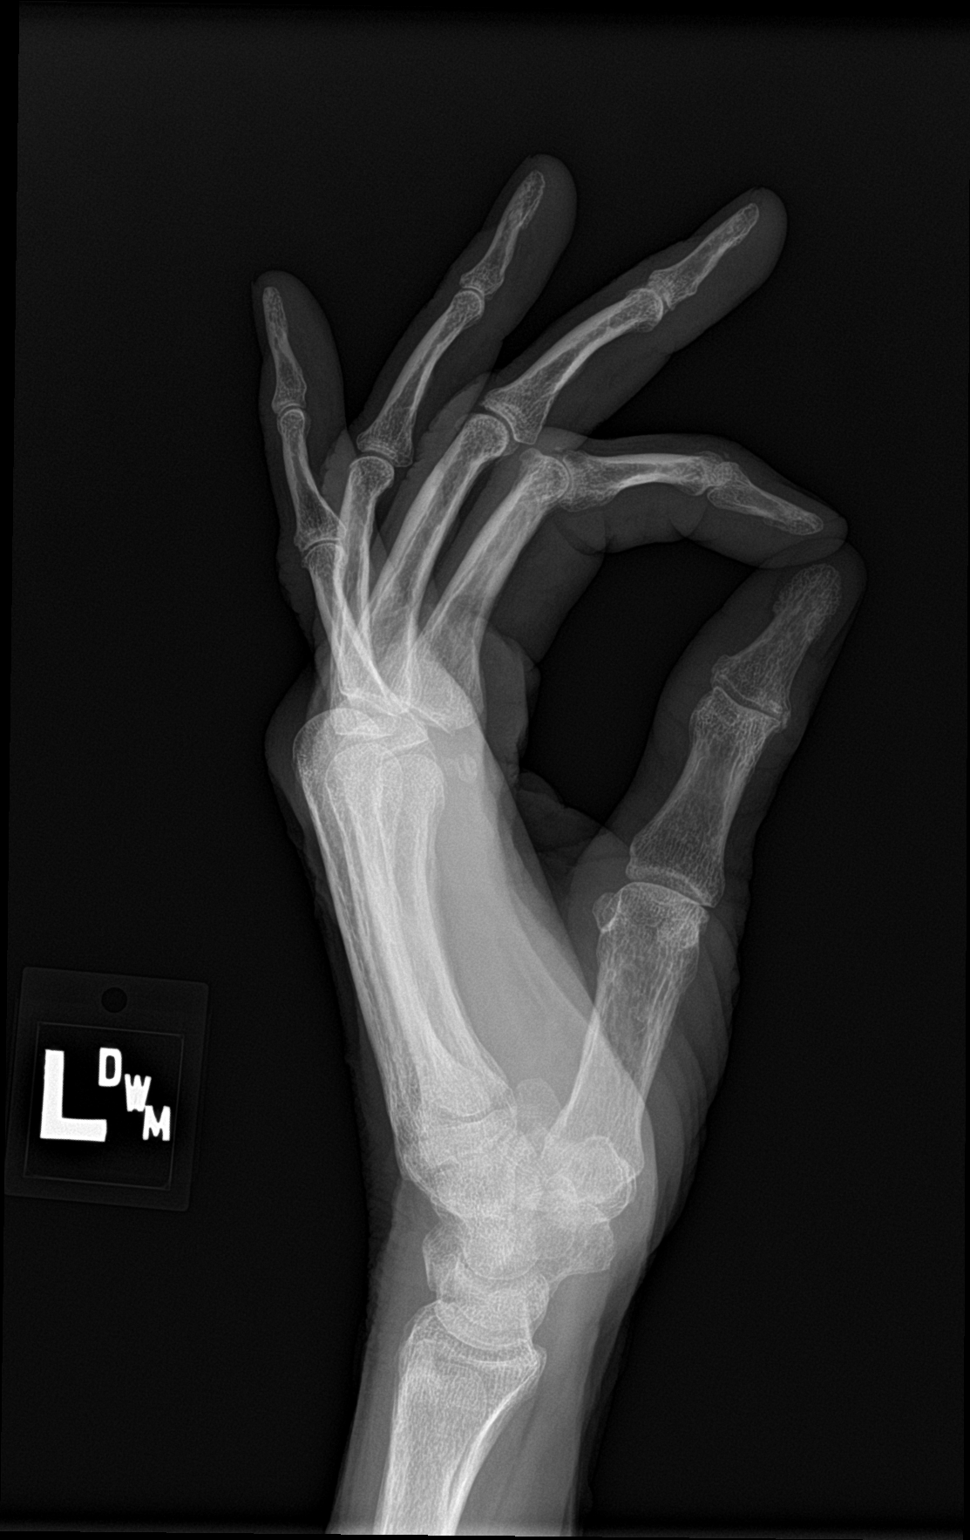

[3 of 3 positions shown; findings below may reference images not displayed]

FINDINGS: There is no evidence of fracture or dislocation. Osteoarthritic
changes at the first carpometacarpal joint, mild. Mild
osteoarthritic changes at the radiocarpal joint. Soft tissues are
unremarkable.
IMPRESSION: Mild osteoarthritic changes at the first carpometacarpal joint and
radiocarpal joint.
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
## 2016-12-26 NOTE — ED Triage Notes (Signed)
Reports pain and knot along radial left wrist for past 2 months; range of motion painful and no strength; no known injury. Takes ibuprofen daily.

## 2016-12-27 DIAGNOSIS — J3089 Other allergic rhinitis: Secondary | ICD-10-CM | POA: Diagnosis not present

## 2016-12-27 DIAGNOSIS — J3081 Allergic rhinitis due to animal (cat) (dog) hair and dander: Secondary | ICD-10-CM | POA: Diagnosis not present

## 2016-12-27 DIAGNOSIS — J301 Allergic rhinitis due to pollen: Secondary | ICD-10-CM | POA: Diagnosis not present

## 2016-12-30 MED FILL — traMADol HCL 50 MG TABS: 50 | 10 days supply | Qty: 60 | Fill #4

## 2016-12-30 MED FILL — ESTRADIOL 0.1 MG/GM CRM: 0.1 | 30 days supply | Qty: 43 | Fill #0

## 2017-01-04 MED FILL — buPROPion HCL ER (XL) 150 M: 150 | 30 days supply | Qty: 90 | Fill #1

## 2017-01-11 MED FILL — ZOLPIDEM TARTRATE 5 MG TABL: 5 | 30 days supply | Qty: 30 | Fill #5

## 2017-01-11 MED FILL — LEVOTHYROXINE 50 MCG TABLET: 50 | 30 days supply | Qty: 30 | Fill #1

## 2017-01-12 DIAGNOSIS — J301 Allergic rhinitis due to pollen: Secondary | ICD-10-CM | POA: Diagnosis not present

## 2017-01-12 DIAGNOSIS — J3081 Allergic rhinitis due to animal (cat) (dog) hair and dander: Secondary | ICD-10-CM | POA: Diagnosis not present

## 2017-01-12 DIAGNOSIS — J4521 Mild intermittent asthma with (acute) exacerbation: Secondary | ICD-10-CM | POA: Diagnosis not present

## 2017-01-12 DIAGNOSIS — J3089 Other allergic rhinitis: Secondary | ICD-10-CM | POA: Diagnosis not present

## 2017-01-17 ENCOUNTER — Ambulatory Visit (INDEPENDENT_AMBULATORY_CARE_PROVIDER_SITE_OTHER): Payer: Medicare Other | Admitting: Licensed Clinical Social Worker

## 2017-01-17 ENCOUNTER — Other Ambulatory Visit: Payer: Self-pay | Admitting: Family Medicine

## 2017-01-17 DIAGNOSIS — F3341 Major depressive disorder, recurrent, in partial remission: Secondary | ICD-10-CM

## 2017-01-18 MED FILL — GABAPENTIN 600 MG TABS: 600 | 29 days supply | Qty: 115 | Fill #0

## 2017-01-20 ENCOUNTER — Ambulatory Visit (INDEPENDENT_AMBULATORY_CARE_PROVIDER_SITE_OTHER): Payer: Medicare Other | Admitting: Family Medicine

## 2017-01-20 ENCOUNTER — Encounter: Payer: Self-pay | Admitting: Family Medicine

## 2017-01-20 VITALS — BP 132/61 | HR 91 | Wt 123.0 lb

## 2017-01-20 DIAGNOSIS — M79641 Pain in right hand: Secondary | ICD-10-CM

## 2017-01-20 DIAGNOSIS — M1812 Unilateral primary osteoarthritis of first carpometacarpal joint, left hand: Secondary | ICD-10-CM | POA: Diagnosis not present
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
## 2017-01-21 ENCOUNTER — Telehealth: Payer: Self-pay | Admitting: *Deleted

## 2017-01-21 DIAGNOSIS — R768 Other specified abnormal immunological findings in serum: Secondary | ICD-10-CM

## 2017-01-21 DIAGNOSIS — M25542 Pain in joints of left hand: Secondary | ICD-10-CM

## 2017-01-21 LAB — ANA: ANA: POSITIVE — AB

## 2017-01-21 LAB — ANTI-NUCLEAR AB-TITER (ANA TITER)

## 2017-01-21 LAB — SEDIMENTATION RATE: SED RATE: 6 mm/h (ref 0–30)

## 2017-01-21 LAB — CYCLIC CITRUL PEPTIDE ANTIBODY, IGG

## 2017-01-21 LAB — RHEUMATOID FACTOR: Rhuematoid fact SerPl-aCnc: <14 IU/mL (ref <14)

## 2017-01-21 LAB — RPR: RPR Ser Ql: NONREACTIVE

## 2017-01-26 DIAGNOSIS — J301 Allergic rhinitis due to pollen: Secondary | ICD-10-CM | POA: Diagnosis not present

## 2017-01-26 DIAGNOSIS — J3089 Other allergic rhinitis: Secondary | ICD-10-CM | POA: Diagnosis not present

## 2017-01-26 DIAGNOSIS — J3081 Allergic rhinitis due to animal (cat) (dog) hair and dander: Secondary | ICD-10-CM | POA: Diagnosis not present

## 2017-01-31 DIAGNOSIS — Z681 Body mass index (BMI) 19 or less, adult: Secondary | ICD-10-CM | POA: Diagnosis not present

## 2017-01-31 DIAGNOSIS — Z01419 Encounter for gynecological examination (general) (routine) without abnormal findings: Secondary | ICD-10-CM | POA: Diagnosis not present

## 2017-02-01 MED FILL — traMADol HCL 50 MG TABS: 50 | 10 days supply | Qty: 60 | Fill #5

## 2017-02-01 MED FILL — LEVOTHYROXINE 50 MCG TABLET: 50 | 30 days supply | Qty: 30 | Fill #2

## 2017-02-07 MED FILL — CYCLOBENZAPRINE 10 MG TAB: 10 | 30 days supply | Qty: 60 | Fill #1

## 2017-02-07 MED FILL — buPROPion HCL ER (XL) 150 M: 150 | 30 days supply | Qty: 90 | Fill #2

## 2017-02-09 ENCOUNTER — Other Ambulatory Visit: Payer: Self-pay | Admitting: *Deleted

## 2017-02-09 MED ORDER — ZOLPIDEM TARTRATE 5 MG PO TABS: ORAL_TABLET | ORAL | 5 refills | Status: DC

## 2017-02-09 MED FILL — ZOLPIDEM TARTRATE 5 MG TABL: 5 | 30 days supply | Qty: 30 | Fill #0

## 2017-02-09 NOTE — Progress Notes (Signed)
ambien refilled 

## 2017-02-10 NOTE — Telephone Encounter (Signed)
Robin Conrad called and states her hand pain is not worse but it still is hurting bad. She is waiting on a referral to Rheumatology. Is it ok to place the order? What would be the diagnosis?

## 2017-02-10 NOTE — Telephone Encounter (Signed)
Yes, ok to place order.  Dx. Joint pain and elevated ANA. Thank u

## 2017-02-11 NOTE — Telephone Encounter (Signed)
Referral sent. Patient is aware.

## 2017-02-14 ENCOUNTER — Ambulatory Visit (INDEPENDENT_AMBULATORY_CARE_PROVIDER_SITE_OTHER): Payer: Medicare Other | Admitting: Licensed Clinical Social Worker

## 2017-02-14 DIAGNOSIS — F3341 Major depressive disorder, recurrent, in partial remission: Secondary | ICD-10-CM

## 2017-02-18 ENCOUNTER — Telehealth: Payer: Self-pay

## 2017-02-18 MED ORDER — TRAZODONE HCL 50 MG PO TABS: 25.0000 mg | ORAL_TABLET | Freq: Every evening | ORAL | 3 refills | Status: DC | PRN

## 2017-02-18 MED FILL — GABAPENTIN 600 MG TABS: 600 | 29 days supply | Qty: 115 | Fill #1

## 2017-02-18 MED FILL — traZODone HCL 50 MG TABS: 50 | 15 days supply | Qty: 30 | Fill #0

## 2017-02-18 NOTE — Telephone Encounter (Signed)
Robin Conrad called and states her therapist recommended she take trazodone for her insomnia and depression. Please advise.

## 2017-02-18 NOTE — Telephone Encounter (Signed)
Pt advised.

## 2017-02-18 NOTE — Telephone Encounter (Signed)
And definitely try trazodone.  I will send over new prescription but she will need to cut her Ambien in half and try to wean that off over the next week as she starts the trazodone

## 2017-03-01 ENCOUNTER — Other Ambulatory Visit: Payer: Self-pay | Admitting: Family Medicine

## 2017-03-01 MED FILL — traMADol HCL 50 MG TABS: 50 | 10 days supply | Qty: 60 | Fill #0

## 2017-03-03 DIAGNOSIS — J301 Allergic rhinitis due to pollen: Secondary | ICD-10-CM | POA: Diagnosis not present

## 2017-03-03 DIAGNOSIS — J3089 Other allergic rhinitis: Secondary | ICD-10-CM | POA: Diagnosis not present

## 2017-03-03 DIAGNOSIS — J3081 Allergic rhinitis due to animal (cat) (dog) hair and dander: Secondary | ICD-10-CM | POA: Diagnosis not present

## 2017-03-09 ENCOUNTER — Other Ambulatory Visit: Payer: Self-pay | Admitting: Family Medicine

## 2017-03-09 MED FILL — CYCLOBENZAPRINE HCL 10 MG T: 10 | 30 days supply | Qty: 60 | Fill #0

## 2017-03-09 MED FILL — LEVOTHYROXINE 50 MCG TABLET: 50 | 30 days supply | Qty: 30 | Fill #3

## 2017-03-09 MED FILL — traZODone HCL 50 MG TABS: 50 | 15 days supply | Qty: 30 | Fill #1

## 2017-03-09 MED FILL — traMADol HCL 50 MG TABS: 50 | 10 days supply | Qty: 60 | Fill #1

## 2017-03-09 MED FILL — buPROPion HCL ER (XL) 150 M: 150 | 30 days supply | Qty: 90 | Fill #3

## 2017-03-09 MED FILL — ZOLPIDEM TARTRATE 5 MG TABL: 5 | 30 days supply | Qty: 30 | Fill #1

## 2017-03-09 MED FILL — PROAIR HFA 90 MCG INHALER: 108 (90 BAS | 30 days supply | Qty: 9 | Fill #0

## 2017-03-21 ENCOUNTER — Other Ambulatory Visit: Payer: Self-pay | Admitting: Family Medicine

## 2017-03-21 MED FILL — GABAPENTIN 600 MG TABS: 600 | 29 days supply | Qty: 115 | Fill #0

## 2017-03-22 ENCOUNTER — Ambulatory Visit (INDEPENDENT_AMBULATORY_CARE_PROVIDER_SITE_OTHER): Payer: Medicare Other | Admitting: Licensed Clinical Social Worker

## 2017-03-22 DIAGNOSIS — F3341 Major depressive disorder, recurrent, in partial remission: Secondary | ICD-10-CM | POA: Diagnosis not present

## 2017-03-28 ENCOUNTER — Ambulatory Visit: Payer: Self-pay | Admitting: Family Medicine

## 2017-03-31 DIAGNOSIS — M351 Other overlap syndromes: Secondary | ICD-10-CM | POA: Diagnosis not present

## 2017-04-01 MED FILL — predniSONE 5 MG TABS: 5 | 30 days supply | Qty: 90 | Fill #0

## 2017-04-05 DIAGNOSIS — J3081 Allergic rhinitis due to animal (cat) (dog) hair and dander: Secondary | ICD-10-CM | POA: Diagnosis not present

## 2017-04-05 DIAGNOSIS — J3089 Other allergic rhinitis: Secondary | ICD-10-CM | POA: Diagnosis not present

## 2017-04-05 DIAGNOSIS — R238 Other skin changes: Secondary | ICD-10-CM | POA: Insufficient documentation

## 2017-04-05 DIAGNOSIS — J301 Allergic rhinitis due to pollen: Secondary | ICD-10-CM | POA: Diagnosis not present

## 2017-04-05 DIAGNOSIS — R233 Spontaneous ecchymoses: Secondary | ICD-10-CM | POA: Insufficient documentation

## 2017-04-06 ENCOUNTER — Encounter: Payer: Self-pay | Admitting: Family Medicine

## 2017-04-06 ENCOUNTER — Telehealth: Payer: Self-pay | Admitting: Family Medicine

## 2017-04-06 ENCOUNTER — Ambulatory Visit (INDEPENDENT_AMBULATORY_CARE_PROVIDER_SITE_OTHER): Payer: Medicare Other | Admitting: Family Medicine

## 2017-04-06 VITALS — BP 134/58 | HR 103 | Ht 66.0 in | Wt 123.0 lb

## 2017-04-06 DIAGNOSIS — R768 Other specified abnormal immunological findings in serum: Secondary | ICD-10-CM | POA: Diagnosis not present

## 2017-04-06 DIAGNOSIS — K581 Irritable bowel syndrome with constipation: Secondary | ICD-10-CM

## 2017-04-06 DIAGNOSIS — M797 Fibromyalgia: Secondary | ICD-10-CM

## 2017-04-06 DIAGNOSIS — F5104 Psychophysiologic insomnia: Secondary | ICD-10-CM | POA: Diagnosis not present

## 2017-04-06 MED ORDER — AMBULATORY NON FORMULARY MEDICATION: 0 refills | Status: DC

## 2017-04-06 NOTE — Progress Notes (Signed)
Subjective:    CC: F/U fibro  HPI:  F/U fibromyalgia -she is really been having a lot of difficulty with her hands in particular.  I saw her in November she was started feeling in her hands at the base of her thumbs.  She is now been wearing some gloves for support.  She says just opening baby food jars is difficult for her.  She is having to get her husband to help her with housework and even just putting her hand around a wide cup and then trying to raise it is painful.  She is been having some swelling particularly over the MCPs on the index and middle finger on her right hands.  She was able to get in with rheumatology and met with Dr. symbol.  He is doing some additional workup it in the past she was told that she may have a borderline diagnosis of lupus but with the swelling over the joints in her hands is a little bit more reminiscent for rheumatoid.  Chronic insomnia-she is finally found a good balance with her medications.  She is down to a quarter of trazodone.  When she would take a whole or even a half she would feel dizzy the next day.  So she is been taking a quarter of a tab with a half a tab of Ambien and that has actually worked really well for her.  Her  bowel syndrome has been flaring more.  She gets difficulty with constipation and then will usually use a stimulant sometimes for 2-3 days before she starts to move her bowels.  But then she starts to get leakage that can sometimes last all day and can be embarrassing and keep her at home.  And then when she finally moves her bowels then she does not go again for another 3 or 4 days.  She does take a magnesium tab daily.  Past medical history, Surgical history, Family history not pertinant except as noted below, Social history, Allergies, and medications have been entered into the medical record, reviewed, and corrections made.   Review of Systems: No fevers, chills, night sweats, weight loss, chest pain, or shortness of breath.    Objective:    General: Well Developed, well nourished, and in no acute distress.  Neuro: Alert and oriented x3, extra-ocular muscles intact, sensation grossly intact.  HEENT: Normocephalic, atraumatic  Skin: Warm and dry, no rashes. Cardiac: Regular rate and rhythm, no murmurs rubs or gallops, no lower extremity edema.  Respiratory: Clear to auscultation bilaterally. Not using accessory muscles, speaking in full sentences.   Impression and Recommendations:    Fibromyalgia -continue current regimen.  No medication changes made today.  It does seem like she is doing much better with her sleep which is wonderful.  Chronic insomnia-on a stable regimen doing well.  Continue with quarter tab of trazodone half tablet of Ambien nightly.  Irritable bowel syndrome, constipation predominant.  She is been having a lot of difficulty recently.  She is mostly relying on a stimulant laxative but then when she uses it after couple of days she actually starts to get a lot of stool leakage.  She says she thinks she wants to try something more natural.  She could certainly try aloe juice or consider getting back in with her GI doctor to talk about some newer prescription options if she is at that point.  Positive ANA with speckled pattern.  ANA titer of 1-320.  Getting additional workup through Dr. Vira Agar symbol,  rheumatology here in [place].  Scription printed for Tdap vaccine to get done at her pharmacy.  She says her pharmacy still does not have the shingles vaccine in stock.  Did encourage her to call her insurance company to see if she might be able to have it given here in the office.

## 2017-04-06 NOTE — Telephone Encounter (Signed)
Received PA for Cyclobenzaprine HCL sent through cover my meds and received approval.  - CF  Case ID:  [id number] Valid: 03/07/17 - 04/06/2018

## 2017-04-06 NOTE — Patient Instructions (Signed)
Can try aloe juice for your bowels.

## 2017-04-08 ENCOUNTER — Encounter: Payer: Self-pay | Admitting: Family Medicine

## 2017-04-11 ENCOUNTER — Other Ambulatory Visit: Payer: Self-pay | Admitting: Family Medicine

## 2017-04-11 MED FILL — LEVOTHYROXINE 50 MCG TABLET: 50 | 30 days supply | Qty: 30 | Fill #0

## 2017-04-11 MED FILL — CYCLOBENZAPRINE HCL 10 MG T: 10 | 30 days supply | Qty: 60 | Fill #1

## 2017-04-11 MED FILL — buPROPion HCL ER (XL) 150 M: 150 | 30 days supply | Qty: 90 | Fill #4

## 2017-04-14 MED FILL — GABAPENTIN 600 MG TABS: 600 | 29 days supply | Qty: 115 | Fill #1

## 2017-04-14 MED FILL — BOOSTRIX VACCINE SYRINGE: 5-2.5-18.5 | 1 days supply | Qty: 1 | Fill #0

## 2017-04-20 ENCOUNTER — Ambulatory Visit: Payer: Self-pay | Admitting: Licensed Clinical Social Worker

## 2017-04-20 ENCOUNTER — Telehealth: Payer: Self-pay | Admitting: Family Medicine

## 2017-04-20 DIAGNOSIS — J301 Allergic rhinitis due to pollen: Secondary | ICD-10-CM | POA: Diagnosis not present

## 2017-04-20 DIAGNOSIS — J3081 Allergic rhinitis due to animal (cat) (dog) hair and dander: Secondary | ICD-10-CM | POA: Diagnosis not present

## 2017-04-20 DIAGNOSIS — J3089 Other allergic rhinitis: Secondary | ICD-10-CM | POA: Diagnosis not present

## 2017-04-20 NOTE — Telephone Encounter (Signed)
Received fax from [facility] for renewal authorization of Ambien. Sent PA through [place] my Meds and awaiting approval. KG LPN

## 2017-04-21 DIAGNOSIS — M351 Other overlap syndromes: Secondary | ICD-10-CM | POA: Diagnosis not present

## 2017-04-22 MED FILL — MELOXICAM 15 MG TABLET: 15 | 30 days supply | Qty: 30 | Fill #0

## 2017-04-22 NOTE — Telephone Encounter (Signed)
Received fax from [facility] and they approved coverage on Ambien. I will notify pharmacy. - CF  Case ID: [id number] Valid: 03/21/17 - 04/21/2018

## 2017-04-25 ENCOUNTER — Ambulatory Visit (INDEPENDENT_AMBULATORY_CARE_PROVIDER_SITE_OTHER): Payer: Medicare Other | Admitting: Licensed Clinical Social Worker

## 2017-04-25 DIAGNOSIS — F3341 Major depressive disorder, recurrent, in partial remission: Secondary | ICD-10-CM | POA: Diagnosis not present

## 2017-04-27 MED FILL — traMADol HCL 50 MG TABS: 50 | 10 days supply | Qty: 60 | Fill #2

## 2017-05-02 DIAGNOSIS — J301 Allergic rhinitis due to pollen: Secondary | ICD-10-CM | POA: Diagnosis not present

## 2017-05-02 DIAGNOSIS — J3089 Other allergic rhinitis: Secondary | ICD-10-CM | POA: Diagnosis not present

## 2017-05-02 DIAGNOSIS — J3081 Allergic rhinitis due to animal (cat) (dog) hair and dander: Secondary | ICD-10-CM | POA: Diagnosis not present

## 2017-05-10 MED FILL — predniSONE 5 MG TABS: 5 | 30 days supply | Qty: 90 | Fill #1

## 2017-05-12 DIAGNOSIS — Z791 Long term (current) use of non-steroidal anti-inflammatories (NSAID): Secondary | ICD-10-CM | POA: Diagnosis not present

## 2017-05-12 DIAGNOSIS — M351 Other overlap syndromes: Secondary | ICD-10-CM | POA: Diagnosis not present

## 2017-05-12 MED FILL — LEVOTHYROXINE 50 MCG TABLET: 50 | 30 days supply | Qty: 30 | Fill #1

## 2017-05-12 MED FILL — buPROPion HCL ER (XL) 150 M: 150 | 30 days supply | Qty: 90 | Fill #5

## 2017-05-16 ENCOUNTER — Other Ambulatory Visit: Payer: Self-pay | Admitting: Family Medicine

## 2017-05-16 MED FILL — CYCLOBENZAPRINE HCL 10 MG T: 10 | 30 days supply | Qty: 60 | Fill #0

## 2017-05-16 MED FILL — ZOLPIDEM TARTRATE 5 MG TABL: 5 | 30 days supply | Qty: 30 | Fill #2

## 2017-05-16 MED FILL — GABAPENTIN 600 MG TAB: 600 | 29 days supply | Qty: 115 | Fill #0

## 2017-05-18 MED FILL — MELOXICAM 15 MG TABLET: 15 | 30 days supply | Qty: 30 | Fill #1

## 2017-05-19 DIAGNOSIS — J301 Allergic rhinitis due to pollen: Secondary | ICD-10-CM | POA: Diagnosis not present

## 2017-05-19 DIAGNOSIS — J3081 Allergic rhinitis due to animal (cat) (dog) hair and dander: Secondary | ICD-10-CM | POA: Diagnosis not present

## 2017-05-19 DIAGNOSIS — J3089 Other allergic rhinitis: Secondary | ICD-10-CM | POA: Diagnosis not present

## 2017-05-25 DIAGNOSIS — J3089 Other allergic rhinitis: Secondary | ICD-10-CM | POA: Diagnosis not present

## 2017-05-25 DIAGNOSIS — J3081 Allergic rhinitis due to animal (cat) (dog) hair and dander: Secondary | ICD-10-CM | POA: Diagnosis not present

## 2017-05-25 DIAGNOSIS — J301 Allergic rhinitis due to pollen: Secondary | ICD-10-CM | POA: Diagnosis not present

## 2017-05-30 ENCOUNTER — Ambulatory Visit (INDEPENDENT_AMBULATORY_CARE_PROVIDER_SITE_OTHER): Payer: Medicare Other | Admitting: Licensed Clinical Social Worker

## 2017-05-30 DIAGNOSIS — F3341 Major depressive disorder, recurrent, in partial remission: Secondary | ICD-10-CM

## 2017-06-02 MED FILL — traMADol HCL 50 MG TABS: 50 | 10 days supply | Qty: 60 | Fill #3

## 2017-06-08 DIAGNOSIS — J3081 Allergic rhinitis due to animal (cat) (dog) hair and dander: Secondary | ICD-10-CM | POA: Diagnosis not present

## 2017-06-08 DIAGNOSIS — J3089 Other allergic rhinitis: Secondary | ICD-10-CM | POA: Diagnosis not present

## 2017-06-08 DIAGNOSIS — J301 Allergic rhinitis due to pollen: Secondary | ICD-10-CM | POA: Diagnosis not present

## 2017-06-09 ENCOUNTER — Other Ambulatory Visit: Payer: Self-pay | Admitting: Family Medicine

## 2017-06-09 MED FILL — buPROPion HCL ER (XL) 150 M: 150 | 30 days supply | Qty: 270 | Fill #0

## 2017-06-10 MED FILL — LEVOTHYROXINE 50 MCG TABLET: 50 | 30 days supply | Qty: 30 | Fill #2

## 2017-06-10 MED FILL — traZODone HCL 50 MG TABS: 50 | 15 days supply | Qty: 30 | Fill #2

## 2017-06-16 MED FILL — GABAPENTIN 600 MG TAB: 600 | 29 days supply | Qty: 115 | Fill #1

## 2017-06-16 MED FILL — MELOXICAM 15 MG TABLET: 15 | 30 days supply | Qty: 30 | Fill #0

## 2017-06-23 DIAGNOSIS — M351 Other overlap syndromes: Secondary | ICD-10-CM | POA: Diagnosis not present

## 2017-06-23 DIAGNOSIS — Z791 Long term (current) use of non-steroidal anti-inflammatories (NSAID): Secondary | ICD-10-CM | POA: Diagnosis not present

## 2017-06-29 DIAGNOSIS — J301 Allergic rhinitis due to pollen: Secondary | ICD-10-CM | POA: Diagnosis not present

## 2017-06-29 DIAGNOSIS — J3081 Allergic rhinitis due to animal (cat) (dog) hair and dander: Secondary | ICD-10-CM | POA: Diagnosis not present

## 2017-06-29 DIAGNOSIS — J3089 Other allergic rhinitis: Secondary | ICD-10-CM | POA: Diagnosis not present

## 2017-07-01 MED FILL — traMADol HCL 50 MG TABS: 50 | 10 days supply | Qty: 60 | Fill #4

## 2017-07-06 NOTE — Progress Notes (Signed)
Subjective:    CC: 6 months f/u   HPI:  F/U fibromyalgia -she has been wearing her gloves which have been has been helpful for her with her hand pain in particular.  Is always better on the weekends when she is not as active and not taking care of her grandchildren.  She did follow-up with Dr. symbol who was diagnosed her with a connective tissue disorder.  They will be following this regularly.  He did want to know if it would be okay to use Tylenol for pain relief.  Chronic insomnia -actually doing really well.  In fact she has dropped off the Ambien and is just using half a tab of the trazodone and that has been sustaining her sleep.  Follow-up irritable bowel syndrome-still having problem with her IBS.  She has not had a chance to go and try the allergies.  She reports that she is had strong urine odor for about 2 weeks.  She is only had a couple of episodes of dysuria though.  No hematuria no fever or back pain.  Is been a little bit better the last couple of days.  She is also been experiencing little bit more frequent headaches over the last couple of weeks.   Past medical history, Surgical history, Family history not pertinant except as noted below, Social history, Allergies, and medications have been entered into the medical record, reviewed, and corrections made.   Review of Systems: No fevers, chills, night sweats, weight loss, chest pain, or shortness of breath.   Objective:    General: Well Developed, well nourished, and in no acute distress.  Neuro: Alert and oriented x3, extra-ocular muscles intact, sensation grossly intact.  HEENT: Normocephalic, atraumatic  Skin: Warm and dry, no rashes. Cardiac: Regular rate and rhythm, no murmurs rubs or gallops, no lower extremity edema.  Respiratory: Clear to auscultation bilaterally. Not using accessory muscles, speaking in full sentences.   Impression and Recommendations:    Fibromyalgia -continue current regimen.    Symptoms  are overall stable.  No recent flares though mostly having problems with her right hand.  Okay to use Tylenol as needed.  Wrote down a safe dose and regimen for her to use.  Connective tissue disease-we will get notes from Dr. Blake Divine office and update her chart.  Chronic insomnia-on a stable regimen doing well.  Continue with quarter tab of trazodone half tablet of Ambien nightly.  Odor to urine-unable to give a urine sample today.  Discussed that if her symptoms return or she starts getting dysuria again then please give [place] a call back and we can send down an order for urinalysis.  HA - likely from pressure changes over the last couple of weeks and pollen. Call if persisting.

## 2017-07-07 ENCOUNTER — Ambulatory Visit: Payer: Medicare Other | Admitting: Licensed Clinical Social Worker

## 2017-07-07 ENCOUNTER — Ambulatory Visit (INDEPENDENT_AMBULATORY_CARE_PROVIDER_SITE_OTHER): Payer: Medicare Other | Admitting: Family Medicine

## 2017-07-07 ENCOUNTER — Encounter: Payer: Self-pay | Admitting: Family Medicine

## 2017-07-07 VITALS — BP 97/59 | HR 100 | Ht 66.0 in | Wt 122.0 lb

## 2017-07-07 DIAGNOSIS — M359 Systemic involvement of connective tissue, unspecified: Secondary | ICD-10-CM | POA: Diagnosis not present

## 2017-07-07 DIAGNOSIS — K581 Irritable bowel syndrome with constipation: Secondary | ICD-10-CM | POA: Diagnosis not present

## 2017-07-07 DIAGNOSIS — F5104 Psychophysiologic insomnia: Secondary | ICD-10-CM | POA: Diagnosis not present

## 2017-07-07 DIAGNOSIS — G44209 Tension-type headache, unspecified, not intractable: Secondary | ICD-10-CM

## 2017-07-07 DIAGNOSIS — R8290 Unspecified abnormal findings in urine: Secondary | ICD-10-CM

## 2017-07-07 DIAGNOSIS — M797 Fibromyalgia: Secondary | ICD-10-CM

## 2017-07-07 MED ORDER — GABAPENTIN 600 MG PO TABS: ORAL_TABLET | ORAL | 1 refills | Status: DC

## 2017-07-07 MED FILL — GABAPENTIN 600 MG TABS: 600 | 29 days supply | Qty: 115 | Fill #0

## 2017-07-07 NOTE — Patient Instructions (Signed)
Okay to use extra strength Tylenol.  Safe to take 2 tabs up to 3 times a day at least 6 hours apart.  Be taken with your Mobic.

## 2017-07-14 MED FILL — LEVOTHYROXINE 50 MCG TABLET: 50 | 30 days supply | Qty: 30 | Fill #3

## 2017-07-21 DIAGNOSIS — J301 Allergic rhinitis due to pollen: Secondary | ICD-10-CM | POA: Diagnosis not present

## 2017-07-21 DIAGNOSIS — J3081 Allergic rhinitis due to animal (cat) (dog) hair and dander: Secondary | ICD-10-CM | POA: Diagnosis not present

## 2017-07-21 DIAGNOSIS — J3089 Other allergic rhinitis: Secondary | ICD-10-CM | POA: Diagnosis not present

## 2017-07-21 MED FILL — MELOXICAM 15 MG TABLET: 15 | 30 days supply | Qty: 30 | Fill #1

## 2017-07-21 MED FILL — CYCLOBENZAPRINE HCL 10 MG T: 10 | 30 days supply | Qty: 60 | Fill #1

## 2017-07-21 MED FILL — traZODone HCL 50 MG TABS: 50 | 15 days supply | Qty: 30 | Fill #3

## 2017-08-02 MED FILL — traMADol HCL 50 MG TABS: 50 | 10 days supply | Qty: 60 | Fill #5

## 2017-08-04 DIAGNOSIS — J301 Allergic rhinitis due to pollen: Secondary | ICD-10-CM | POA: Diagnosis not present

## 2017-08-04 DIAGNOSIS — J3089 Other allergic rhinitis: Secondary | ICD-10-CM | POA: Diagnosis not present

## 2017-08-04 DIAGNOSIS — M351 Other overlap syndromes: Secondary | ICD-10-CM | POA: Diagnosis not present

## 2017-08-04 DIAGNOSIS — J3081 Allergic rhinitis due to animal (cat) (dog) hair and dander: Secondary | ICD-10-CM | POA: Diagnosis not present

## 2017-08-04 DIAGNOSIS — Z791 Long term (current) use of non-steroidal anti-inflammatories (NSAID): Secondary | ICD-10-CM | POA: Diagnosis not present

## 2017-08-12 ENCOUNTER — Ambulatory Visit (INDEPENDENT_AMBULATORY_CARE_PROVIDER_SITE_OTHER): Payer: Medicare Other | Admitting: Licensed Clinical Social Worker

## 2017-08-12 DIAGNOSIS — F3341 Major depressive disorder, recurrent, in partial remission: Secondary | ICD-10-CM | POA: Diagnosis not present

## 2017-08-12 MED FILL — GABAPENTIN 600 MG TAB: 600 | 29 days supply | Qty: 115 | Fill #1

## 2017-08-18 DIAGNOSIS — R300 Dysuria: Secondary | ICD-10-CM | POA: Diagnosis not present

## 2017-08-18 MED FILL — LEVOTHYROXINE 50 MCG TABLET: 50 | 30 days supply | Qty: 30 | Fill #4

## 2017-08-18 MED FILL — NITROFURANTOIN MONO-MCR 100: 100 | 5 days supply | Qty: 10 | Fill #0

## 2017-08-18 MED FILL — MELOXICAM 15 MG TABLET: 15 | 30 days supply | Qty: 30 | Fill #0

## 2017-08-22 DIAGNOSIS — J301 Allergic rhinitis due to pollen: Secondary | ICD-10-CM | POA: Diagnosis not present

## 2017-08-22 DIAGNOSIS — J3081 Allergic rhinitis due to animal (cat) (dog) hair and dander: Secondary | ICD-10-CM | POA: Diagnosis not present

## 2017-08-22 DIAGNOSIS — J3089 Other allergic rhinitis: Secondary | ICD-10-CM | POA: Diagnosis not present

## 2017-09-01 ENCOUNTER — Other Ambulatory Visit: Payer: Self-pay | Admitting: Family Medicine

## 2017-09-01 MED FILL — traZODone HCL 50 MG TABS: 50 | 45 days supply | Qty: 90 | Fill #0

## 2017-09-02 ENCOUNTER — Other Ambulatory Visit: Payer: Self-pay

## 2017-09-02 MED ORDER — TRAMADOL HCL 50 MG PO TABS: ORAL_TABLET | ORAL | 5 refills | Status: DC

## 2017-09-02 MED FILL — traMADol HCL 50 MG TABS: 50 | 10 days supply | Qty: 60 | Fill #0

## 2017-09-02 NOTE — Telephone Encounter (Signed)
[place], who is also patients daughter, requesting RF on two medications:  Tramadol last sent 03-01-17 for #60 with 5 RF   RX pended, please send if appropriate.   Thanks!

## 2017-09-06 DIAGNOSIS — H5203 Hypermetropia, bilateral: Secondary | ICD-10-CM | POA: Diagnosis not present

## 2017-09-06 DIAGNOSIS — H2513 Age-related nuclear cataract, bilateral: Secondary | ICD-10-CM | POA: Diagnosis not present

## 2017-09-07 ENCOUNTER — Other Ambulatory Visit: Payer: Self-pay | Admitting: Family Medicine

## 2017-09-07 DIAGNOSIS — Z1239 Encounter for other screening for malignant neoplasm of breast: Secondary | ICD-10-CM

## 2017-09-08 DIAGNOSIS — J3089 Other allergic rhinitis: Secondary | ICD-10-CM | POA: Diagnosis not present

## 2017-09-08 DIAGNOSIS — J3081 Allergic rhinitis due to animal (cat) (dog) hair and dander: Secondary | ICD-10-CM | POA: Diagnosis not present

## 2017-09-08 DIAGNOSIS — J301 Allergic rhinitis due to pollen: Secondary | ICD-10-CM | POA: Diagnosis not present

## 2017-09-09 ENCOUNTER — Other Ambulatory Visit: Payer: Self-pay | Admitting: Family Medicine

## 2017-09-09 MED FILL — GABAPENTIN 600 MG TAB: 600 | 29 days supply | Qty: 115 | Fill #0

## 2017-09-09 MED FILL — buPROPion HCL ER (XL) 150 M: 150 | 90 days supply | Qty: 270 | Fill #1

## 2017-09-19 ENCOUNTER — Other Ambulatory Visit: Payer: Self-pay | Admitting: Family Medicine

## 2017-09-19 MED FILL — LEVOTHYROXINE 50 MCG TABLET: 50 | 30 days supply | Qty: 30 | Fill #5

## 2017-09-19 MED FILL — MELOXICAM 15 MG TABLET: 15 | 30 days supply | Qty: 30 | Fill #0

## 2017-09-20 MED FILL — CYCLOBENZAPRINE HCL 10 MG T: 10 | 15 days supply | Qty: 60 | Fill #0

## 2017-09-26 ENCOUNTER — Ambulatory Visit (INDEPENDENT_AMBULATORY_CARE_PROVIDER_SITE_OTHER): Payer: Medicare Other | Admitting: Licensed Clinical Social Worker

## 2017-09-26 DIAGNOSIS — F3341 Major depressive disorder, recurrent, in partial remission: Secondary | ICD-10-CM

## 2017-09-27 DIAGNOSIS — J301 Allergic rhinitis due to pollen: Secondary | ICD-10-CM | POA: Diagnosis not present

## 2017-09-27 DIAGNOSIS — J3081 Allergic rhinitis due to animal (cat) (dog) hair and dander: Secondary | ICD-10-CM | POA: Diagnosis not present

## 2017-09-27 DIAGNOSIS — J3089 Other allergic rhinitis: Secondary | ICD-10-CM | POA: Diagnosis not present

## 2017-10-03 DIAGNOSIS — M150 Primary generalized (osteo)arthritis: Secondary | ICD-10-CM | POA: Diagnosis not present

## 2017-10-03 DIAGNOSIS — Z791 Long term (current) use of non-steroidal anti-inflammatories (NSAID): Secondary | ICD-10-CM | POA: Diagnosis not present

## 2017-10-03 DIAGNOSIS — M797 Fibromyalgia: Secondary | ICD-10-CM | POA: Diagnosis not present

## 2017-10-03 DIAGNOSIS — M351 Other overlap syndromes: Secondary | ICD-10-CM | POA: Diagnosis not present

## 2017-10-04 MED FILL — traMADol HCL 50 MG TABS: 50 | 10 days supply | Qty: 60 | Fill #1

## 2017-10-07 ENCOUNTER — Ambulatory Visit: Payer: Self-pay | Admitting: Family Medicine

## 2017-10-12 MED FILL — GABAPENTIN 600 MG TABS: 600 | 29 days supply | Qty: 115 | Fill #1

## 2017-10-12 MED FILL — traMADol HCL 50 MG TABS: 50 | 10 days supply | Qty: 60 | Fill #2

## 2017-10-12 MED FILL — ESTRADIOL 0.1 MG/GM CREA: 0.1 | 30 days supply | Qty: 43 | Fill #1

## 2017-10-14 ENCOUNTER — Ambulatory Visit (INDEPENDENT_AMBULATORY_CARE_PROVIDER_SITE_OTHER): Payer: Medicare Other

## 2017-10-14 DIAGNOSIS — Z1231 Encounter for screening mammogram for malignant neoplasm of breast: Secondary | ICD-10-CM | POA: Diagnosis not present

## 2017-10-14 DIAGNOSIS — Z1239 Encounter for other screening for malignant neoplasm of breast: Secondary | ICD-10-CM

## 2017-10-14 IMAGING — MG DIGITAL SCREENING BILATERAL MAMMOGRAM WITH TOMO AND CAD
8 series · 9 of 24 positions shown · non-contrast
Comparison: Previous exam(s).

CLINICAL DATA: Screening.

EXAM:
DIGITAL SCREENING BILATERAL MAMMOGRAM WITH TOMO AND CAD

[R CC synth-2D]
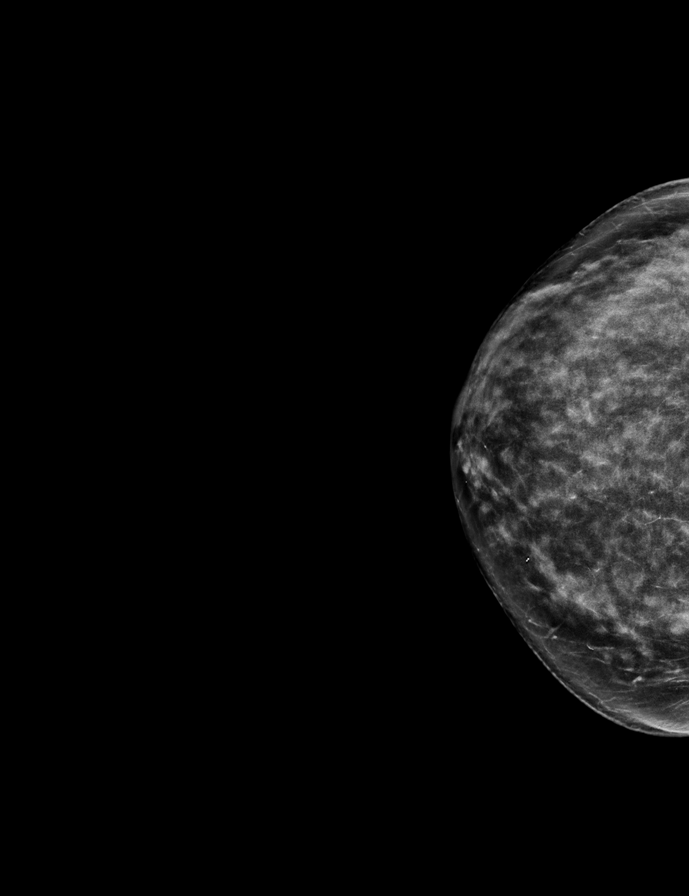

[L MLO synth-2D]
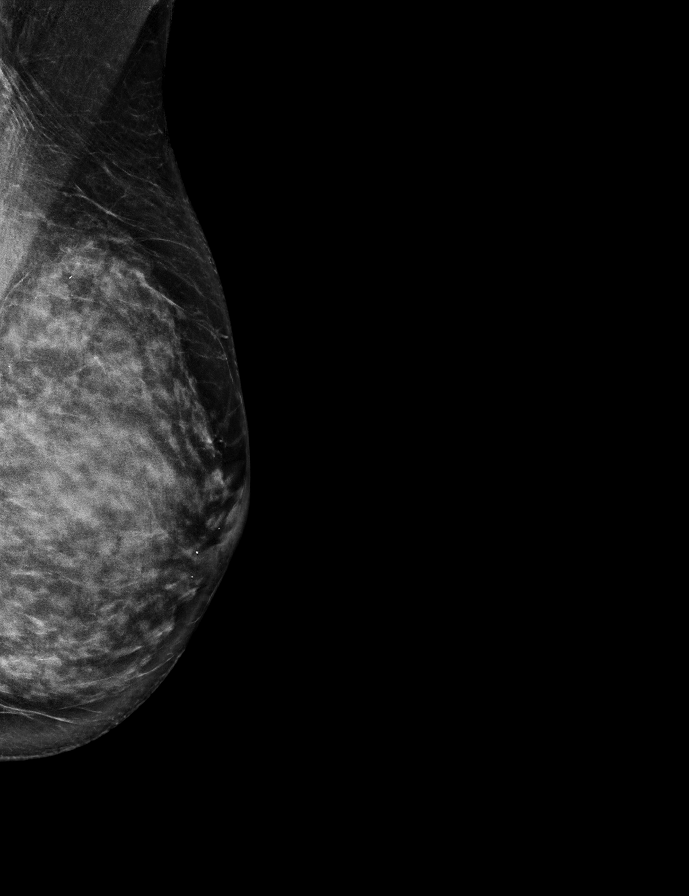

[R MLO synth-2D]
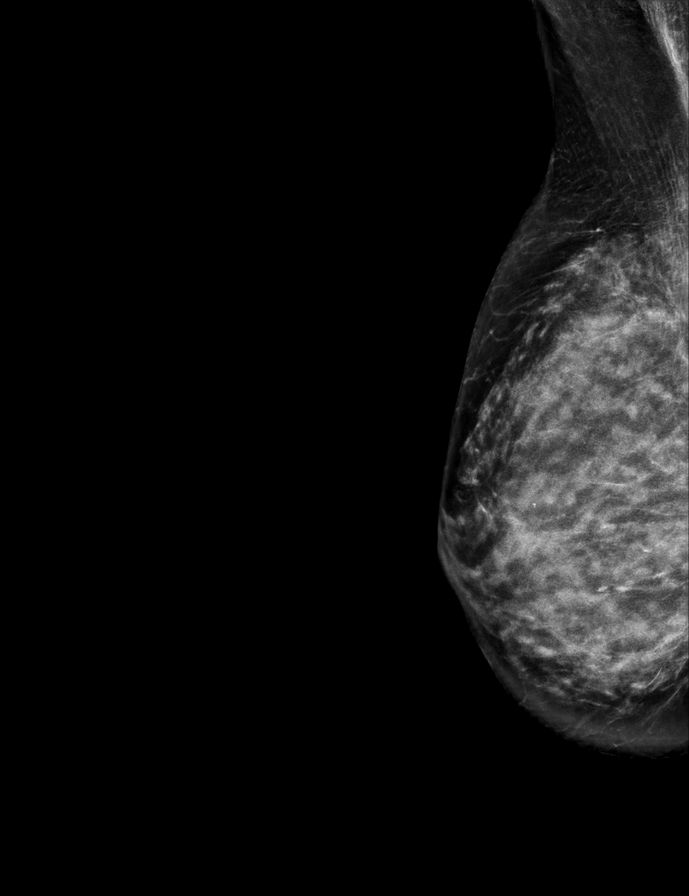

[L CC synth-2D]
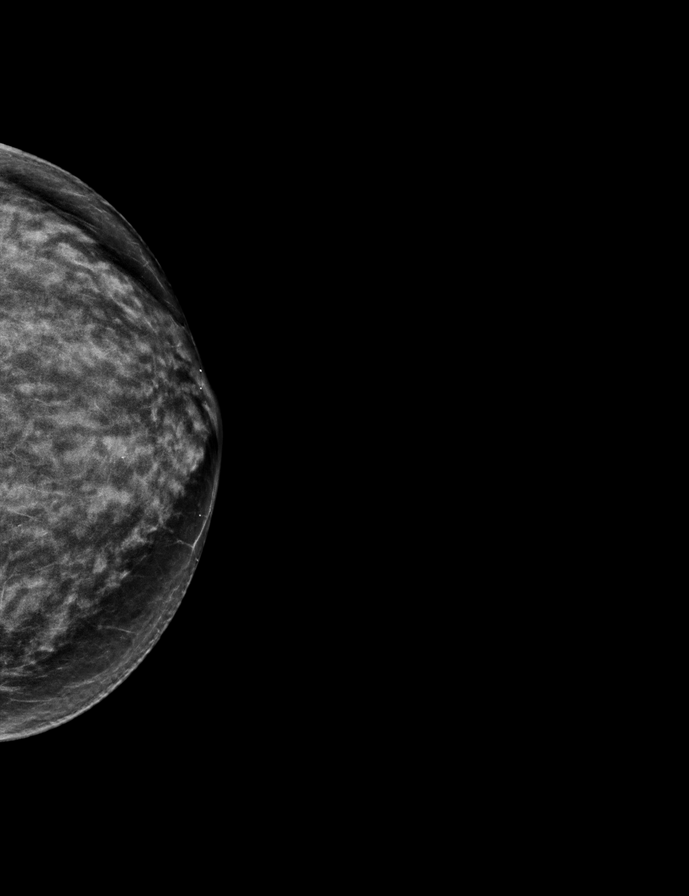

[L CC tomo · 2 of 59 frames shown]
[frame 20/59]
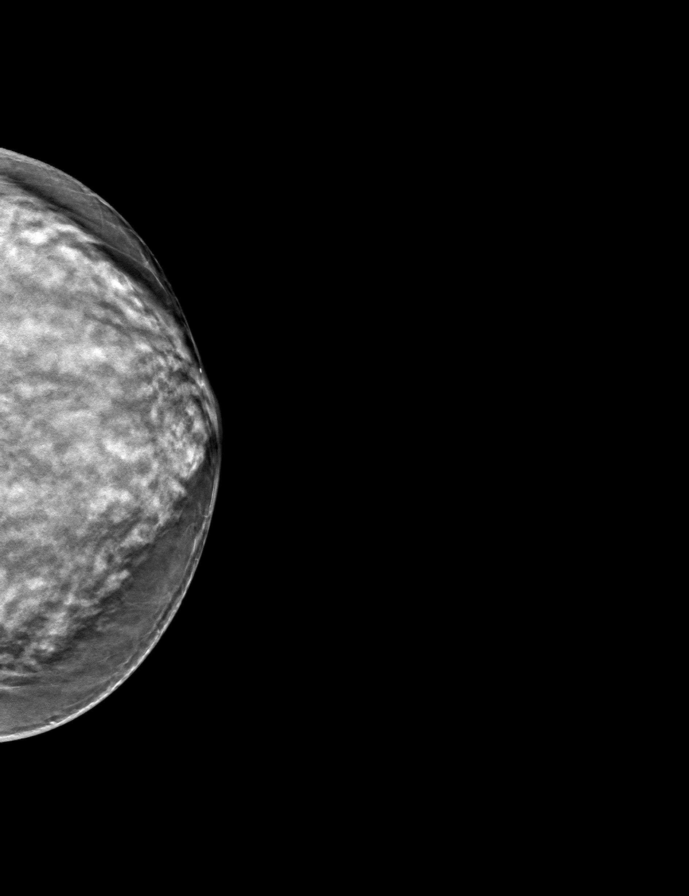
[frame 30/59]
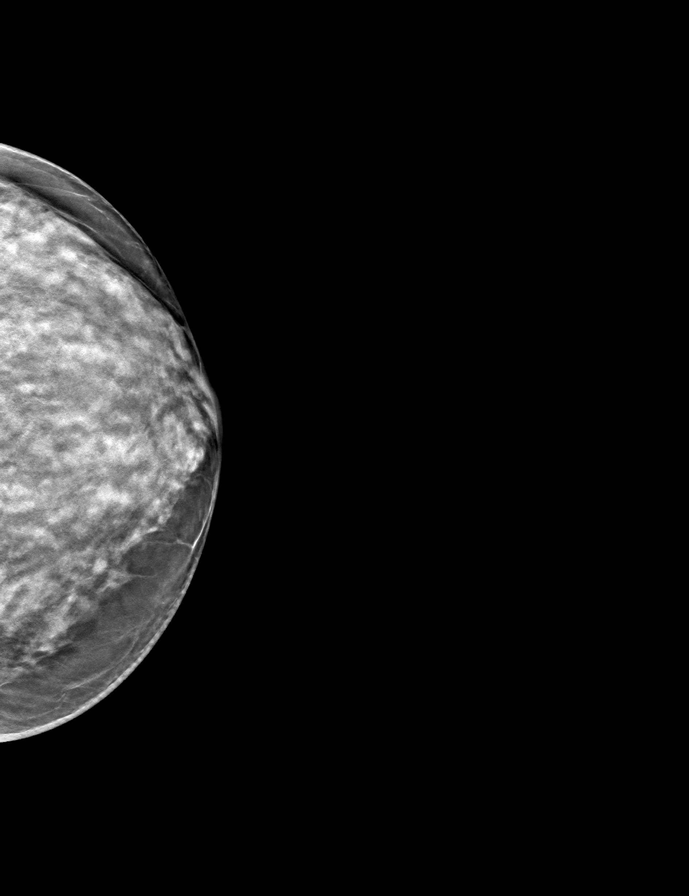

[L MLO tomo · tomo slice 33/64.0]
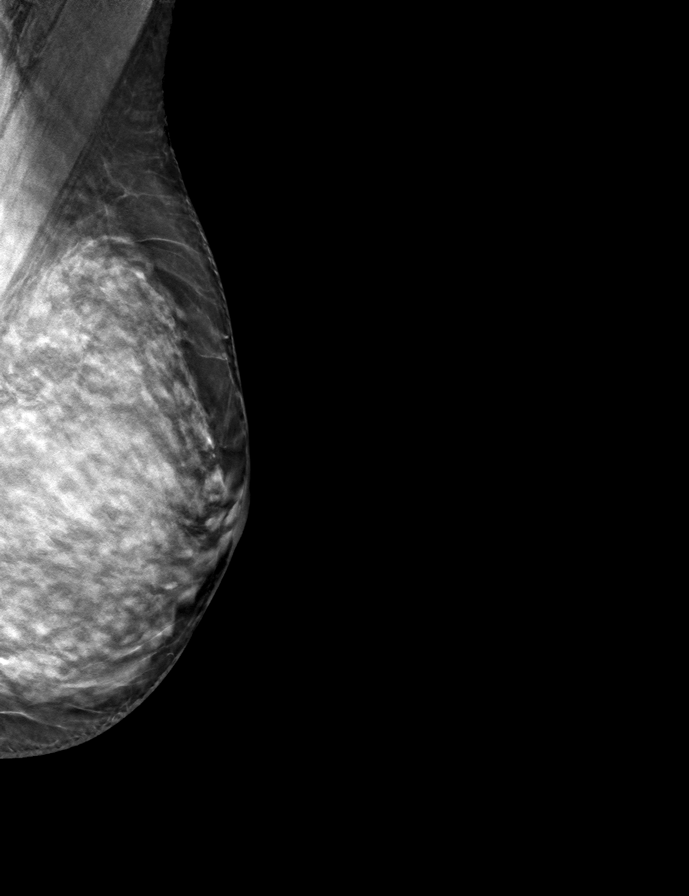

[R MLO tomo · tomo slice 31/60.0]
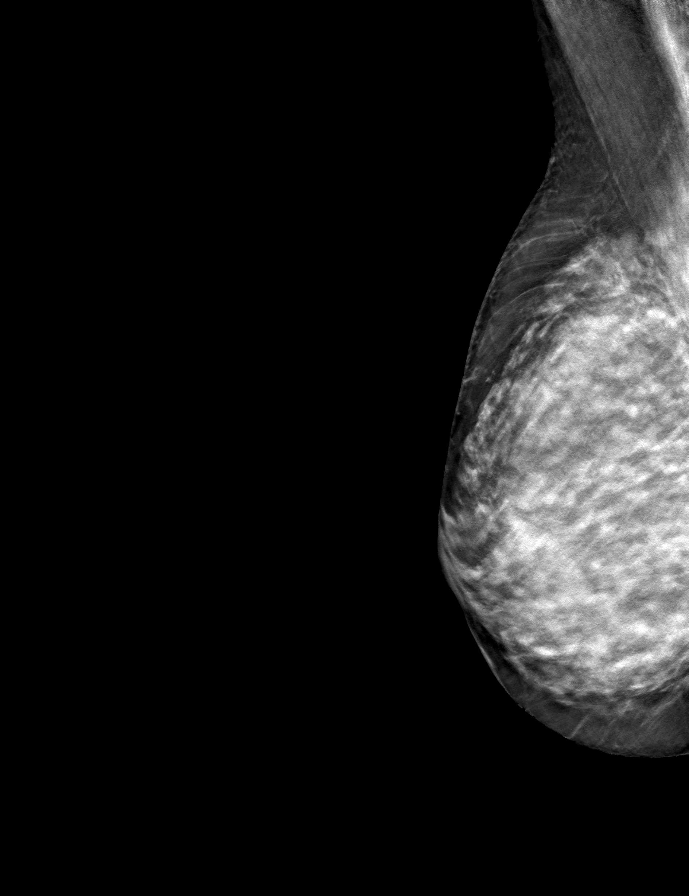

[R CC tomo · tomo slice 30/59.0]
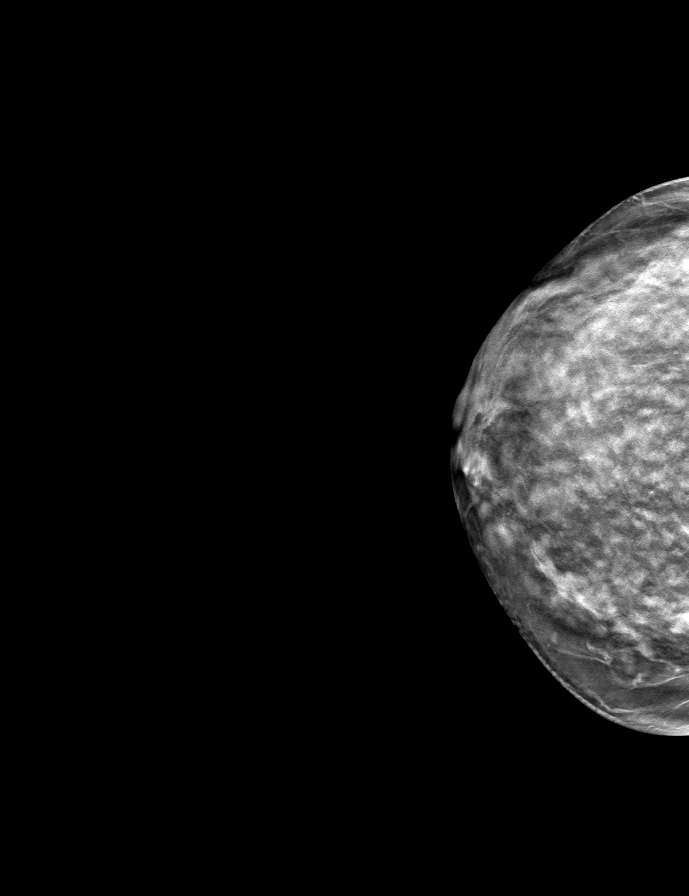

[9 of 24 positions shown; findings below may reference images not displayed]

ACR Breast Density Category d: The breast tissue is extremely dense,
which lowers the sensitivity of mammography.
FINDINGS: There are no findings suspicious for malignancy. Images were
processed with CAD.
IMPRESSION: No mammographic evidence of malignancy. A result letter of this
screening mammogram will be mailed directly to the patient.

RECOMMENDATION:
Screening mammogram in one year. (Code:[PU])

BI-RADS CATEGORY  1: Negative.

## 2017-10-18 ENCOUNTER — Other Ambulatory Visit: Payer: Self-pay | Admitting: Family Medicine

## 2017-10-18 MED FILL — MELOXICAM 15 MG TABLET: 15 | 30 days supply | Qty: 30 | Fill #1

## 2017-10-18 MED FILL — LEVOTHYROXINE 50 MCG TABLET: 50 | 90 days supply | Qty: 90 | Fill #0

## 2017-10-19 DIAGNOSIS — J301 Allergic rhinitis due to pollen: Secondary | ICD-10-CM | POA: Diagnosis not present

## 2017-10-19 DIAGNOSIS — J3089 Other allergic rhinitis: Secondary | ICD-10-CM | POA: Diagnosis not present

## 2017-10-19 DIAGNOSIS — J3081 Allergic rhinitis due to animal (cat) (dog) hair and dander: Secondary | ICD-10-CM | POA: Diagnosis not present

## 2017-10-31 ENCOUNTER — Ambulatory Visit (INDEPENDENT_AMBULATORY_CARE_PROVIDER_SITE_OTHER): Payer: Medicare Other | Admitting: Family Medicine

## 2017-10-31 ENCOUNTER — Encounter: Payer: Self-pay | Admitting: Family Medicine

## 2017-10-31 ENCOUNTER — Encounter: Payer: Self-pay | Admitting: Gastroenterology

## 2017-10-31 VITALS — BP 118/57 | HR 94 | Ht 66.0 in | Wt 121.0 lb

## 2017-10-31 DIAGNOSIS — M797 Fibromyalgia: Secondary | ICD-10-CM

## 2017-10-31 DIAGNOSIS — K581 Irritable bowel syndrome with constipation: Secondary | ICD-10-CM

## 2017-10-31 DIAGNOSIS — Z23 Encounter for immunization: Secondary | ICD-10-CM | POA: Diagnosis not present

## 2017-10-31 DIAGNOSIS — R8290 Unspecified abnormal findings in urine: Secondary | ICD-10-CM | POA: Diagnosis not present

## 2017-10-31 DIAGNOSIS — H9113 Presbycusis, bilateral: Secondary | ICD-10-CM

## 2017-10-31 LAB — POCT URINALYSIS DIPSTICK
Bilirubin, UA: NEGATIVE
Glucose, UA: NEGATIVE
Ketones, UA: NEGATIVE
NITRITE UA: POSITIVE
PROTEIN UA: NEGATIVE
RBC UA: NEGATIVE
SPEC GRAV UA: 1.025 (ref 1.010–1.025)
UROBILINOGEN UA: 0.2 U/dL
pH, UA: 5.5 (ref 5.0–8.0)

## 2017-10-31 MED ORDER — SULFAMETHOXAZOLE-TRIMETHOPRIM 800-160 MG PO TABS: 1.0000 | ORAL_TABLET | Freq: Two times a day (BID) | ORAL | 0 refills | Status: DC

## 2017-10-31 MED FILL — SULFAMETHOXAZOLE-TMP DS TAB: 800-160 | 3 days supply | Qty: 6 | Fill #0

## 2017-10-31 NOTE — Patient Instructions (Signed)
For your bowels recommend a trial of magnesium citrate.  300 and mls per day.  Until you feel like your colon is cleared out.
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
## 2017-11-03 ENCOUNTER — Ambulatory Visit: Payer: Self-pay | Admitting: Licensed Clinical Social Worker

## 2017-11-07 DIAGNOSIS — J3081 Allergic rhinitis due to animal (cat) (dog) hair and dander: Secondary | ICD-10-CM | POA: Diagnosis not present

## 2017-11-07 DIAGNOSIS — J3089 Other allergic rhinitis: Secondary | ICD-10-CM | POA: Diagnosis not present

## 2017-11-07 DIAGNOSIS — J301 Allergic rhinitis due to pollen: Secondary | ICD-10-CM | POA: Diagnosis not present

## 2017-11-08 ENCOUNTER — Ambulatory Visit (INDEPENDENT_AMBULATORY_CARE_PROVIDER_SITE_OTHER): Payer: Medicare Other | Admitting: Licensed Clinical Social Worker

## 2017-11-08 DIAGNOSIS — F3341 Major depressive disorder, recurrent, in partial remission: Secondary | ICD-10-CM | POA: Diagnosis not present

## 2017-11-09 ENCOUNTER — Other Ambulatory Visit: Payer: Self-pay | Admitting: Family Medicine

## 2017-11-09 MED FILL — GABAPENTIN 600 MG TABS: 600 | 29 days supply | Qty: 115 | Fill #0

## 2017-11-16 MED FILL — SHINGRIX 50 MCG SUS: 50 | 1 days supply | Qty: 1 | Fill #0

## 2017-11-18 ENCOUNTER — Ambulatory Visit (INDEPENDENT_AMBULATORY_CARE_PROVIDER_SITE_OTHER): Payer: Medicare Other | Admitting: Gastroenterology

## 2017-11-18 ENCOUNTER — Encounter: Payer: Self-pay | Admitting: Gastroenterology

## 2017-11-18 VITALS — BP 120/80 | HR 80 | Ht 65.5 in | Wt 121.0 lb

## 2017-11-18 DIAGNOSIS — K581 Irritable bowel syndrome with constipation: Secondary | ICD-10-CM

## 2017-11-18 DIAGNOSIS — R159 Full incontinence of feces: Secondary | ICD-10-CM

## 2017-11-18 MED FILL — MELOXICAM 15 MG TABLET: 15 | 30 days supply | Qty: 30 | Fill #0
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
## 2017-11-18 NOTE — Patient Instructions (Signed)
If you are age 68 or older, your body mass index should be between 23-30. Your Body mass index is 19.83 kg/m. If this is out of the aforementioned range listed, please consider follow up with your Primary Care Provider.  If you are age 50 or younger, your body mass index should be between 19-25. Your Body mass index is 19.83 kg/m. If this is out of the aformentioned range listed, please consider follow up with your Primary Care Provider.   Please start taking citrucel (orange flavored) powder fiber supplement.  This may cause some bloating at first but that usually goes away. Begin with a small spoonful and work your way up to a large, heaping spoonful daily over a week.   You have been scheduled to have an anorectal manometry at [facility] on 11/25/2017 at 12:30pm. Please arrive 30 minutes prior to your appointment time for registration (1st floor of the hospital-admissions).  Please make certain to use 1 Fleets enema 2 hours prior to coming for your appointment. You can purchase Fleets enemas from the laxative section at your drug store. You should not eat anything during the two hours prior to the procedure. You may take regular medications with small sips of water at least 2 hours prior to the study.  Anorectal manometry is a test performed to evaluate patients with constipation or fecal incontinence. This test measures the pressures of the anal sphincter muscles, the sensation in the rectum, and the neural reflexes that are needed for normal bowel movements.  THE PROCEDURE The test takes approximately 30 minutes to 1 hour. You will be asked to change into a hospital gown. A technician or nurse will explain the procedure to you, take a brief health history, and answer any questions you may have. The patient then lies on his or her left side. A small, flexible tube, about the size of a thermometer, with a balloon at the end is inserted into the rectum. The catheter is connected to a  machine that measures the pressure. During the test, the small balloon attached to the catheter may be inflated in the rectum to assess the normal reflex pathways. The nurse or technician may also ask the person to squeeze, relax, and push at various times. The anal sphincter muscle pressures are measured during each of these maneuvers. To squeeze, the patient tightens the sphincter muscles as if trying to prevent anything from coming out. To push or bear down, the patient strains down as if trying to have a bowel movement.  It was a pleasure to see you today!  Vito Cirigliano, D.O.

## 2017-11-23 MED FILL — CYCLOBENZAPRINE HCL 10 MG T: 10 | 30 days supply | Qty: 60 | Fill #1

## 2017-11-23 MED FILL — traMADol HCL 50 MG TABS: 50 | 10 days supply | Qty: 60 | Fill #3

## 2017-11-25 ENCOUNTER — Encounter (HOSPITAL_COMMUNITY)
Admission: RE | Disposition: A | Discharge status: Home / Self Care | Payer: Self-pay | Source: Ambulatory Visit | Attending: Gastroenterology

## 2017-11-25 ENCOUNTER — Ambulatory Visit (HOSPITAL_COMMUNITY)
Admission: RE | Admit: 2017-11-25 | Discharge: 2017-11-25 | Disposition: A | Discharge status: Home / Self Care | Payer: Medicare Other | Source: Ambulatory Visit | Attending: Gastroenterology | Admitting: Gastroenterology

## 2017-11-25 DIAGNOSIS — R159 Full incontinence of feces: Secondary | ICD-10-CM | POA: Diagnosis not present

## 2017-11-25 HISTORY — PX: ANAL RECTAL MANOMETRY: SHX6358

## 2017-11-25 SURGERY — MANOMETRY, ANORECTAL

## 2017-11-25 NOTE — Progress Notes (Signed)
Anal manometry done per protocol. Pt tolerated well without distress or complication. Balloon expulsion test done. Pt was able to expel balloon in 15 seconds.

## 2017-11-28 ENCOUNTER — Encounter (HOSPITAL_COMMUNITY): Payer: Self-pay | Admitting: Gastroenterology

## 2017-11-30 DIAGNOSIS — J301 Allergic rhinitis due to pollen: Secondary | ICD-10-CM | POA: Diagnosis not present

## 2017-11-30 DIAGNOSIS — J3089 Other allergic rhinitis: Secondary | ICD-10-CM | POA: Diagnosis not present

## 2017-11-30 DIAGNOSIS — J3081 Allergic rhinitis due to animal (cat) (dog) hair and dander: Secondary | ICD-10-CM | POA: Diagnosis not present

## 2017-12-01 ENCOUNTER — Ambulatory Visit (INDEPENDENT_AMBULATORY_CARE_PROVIDER_SITE_OTHER): Payer: Medicare Other | Admitting: Licensed Clinical Social Worker

## 2017-12-01 DIAGNOSIS — F3341 Major depressive disorder, recurrent, in partial remission: Secondary | ICD-10-CM | POA: Diagnosis not present

## 2017-12-07 ENCOUNTER — Other Ambulatory Visit: Payer: Self-pay | Admitting: Family Medicine

## 2017-12-07 DIAGNOSIS — R159 Full incontinence of feces: Secondary | ICD-10-CM

## 2017-12-07 MED FILL — GABAPENTIN 600 MG TABS: 600 | 29 days supply | Qty: 115 | Fill #1

## 2017-12-07 MED FILL — buPROPion HCL ER (XL) 150 M: 150 | 90 days supply | Qty: 270 | Fill #0

## 2017-12-07 MED FILL — ESTRADIOL 0.1 MG/GM CREA: 0.1 | 30 days supply | Qty: 43 | Fill #2

## 2017-12-09 DIAGNOSIS — M351 Other overlap syndromes: Secondary | ICD-10-CM | POA: Diagnosis not present

## 2017-12-09 DIAGNOSIS — Z79899 Other long term (current) drug therapy: Secondary | ICD-10-CM | POA: Diagnosis not present

## 2017-12-09 DIAGNOSIS — M797 Fibromyalgia: Secondary | ICD-10-CM | POA: Diagnosis not present

## 2017-12-09 DIAGNOSIS — M150 Primary generalized (osteo)arthritis: Secondary | ICD-10-CM | POA: Diagnosis not present

## 2017-12-21 MED FILL — MELOXICAM 15 MG TABLET: 15 | 30 days supply | Qty: 30 | Fill #0

## 2017-12-29 ENCOUNTER — Ambulatory Visit (INDEPENDENT_AMBULATORY_CARE_PROVIDER_SITE_OTHER): Payer: Medicare Other | Admitting: Licensed Clinical Social Worker

## 2017-12-29 DIAGNOSIS — F3341 Major depressive disorder, recurrent, in partial remission: Secondary | ICD-10-CM | POA: Diagnosis not present

## 2018-01-02 MED FILL — traMADol HCL 50 MG TABS: 50 | 10 days supply | Qty: 60 | Fill #4

## 2018-01-04 ENCOUNTER — Other Ambulatory Visit: Payer: Self-pay | Admitting: Family Medicine

## 2018-01-04 MED FILL — GABAPENTIN 600 MG TABS: 600 | 29 days supply | Qty: 115 | Fill #2

## 2018-01-04 MED FILL — CYCLOBENZAPRINE HCL 10 MG T: 10 | 30 days supply | Qty: 60 | Fill #0

## 2018-01-05 DIAGNOSIS — J301 Allergic rhinitis due to pollen: Secondary | ICD-10-CM | POA: Diagnosis not present

## 2018-01-05 DIAGNOSIS — J3089 Other allergic rhinitis: Secondary | ICD-10-CM | POA: Diagnosis not present

## 2018-01-05 DIAGNOSIS — J3081 Allergic rhinitis due to animal (cat) (dog) hair and dander: Secondary | ICD-10-CM | POA: Diagnosis not present

## 2018-01-06 ENCOUNTER — Other Ambulatory Visit: Payer: Self-pay

## 2018-01-06 DIAGNOSIS — K6289 Other specified diseases of anus and rectum: Secondary | ICD-10-CM

## 2018-01-06 DIAGNOSIS — R159 Full incontinence of feces: Secondary | ICD-10-CM

## 2018-01-10 ENCOUNTER — Ambulatory Visit (INDEPENDENT_AMBULATORY_CARE_PROVIDER_SITE_OTHER): Payer: Medicare Other | Admitting: Gastroenterology

## 2018-01-10 VITALS — BP 122/74 | HR 100 | Ht 65.5 in | Wt 119.5 lb

## 2018-01-10 DIAGNOSIS — R159 Full incontinence of feces: Secondary | ICD-10-CM

## 2018-01-10 DIAGNOSIS — K219 Gastro-esophageal reflux disease without esophagitis: Secondary | ICD-10-CM | POA: Diagnosis not present

## 2018-01-10 DIAGNOSIS — K581 Irritable bowel syndrome with constipation: Secondary | ICD-10-CM | POA: Diagnosis not present

## 2018-01-10 NOTE — Patient Instructions (Signed)
If you are age 68 or older, your body mass index should be between 23-30. Your Body mass index is 19.58 kg/m. If this is out of the aforementioned range listed, please consider follow up with your Primary Care Provider.  If you are age 30 or younger, your body mass index should be between 19-25. Your Body mass index is 19.58 kg/m. If this is out of the aformentioned range listed, please consider follow up with your Primary Care Provider.   Continue taking fiber.  Please call our office at [phone] to set up your 3-6 month follow up visit.  It was a pleasure to see you today!  Vito Cirigliano, D.O.
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
## 2018-01-20 MED FILL — LEVOTHYROXINE 50 MCG TABLET: 50 | 90 days supply | Qty: 90 | Fill #1

## 2018-01-24 MED FILL — MELOXICAM 15 MG TABLET: 15 | 30 days supply | Qty: 30 | Fill #0

## 2018-01-31 ENCOUNTER — Ambulatory Visit (INDEPENDENT_AMBULATORY_CARE_PROVIDER_SITE_OTHER): Payer: Medicare Other | Admitting: Family Medicine

## 2018-01-31 ENCOUNTER — Encounter: Payer: Self-pay | Admitting: Family Medicine

## 2018-01-31 VITALS — BP 123/56 | HR 95

## 2018-01-31 DIAGNOSIS — M797 Fibromyalgia: Secondary | ICD-10-CM | POA: Diagnosis not present

## 2018-01-31 DIAGNOSIS — E039 Hypothyroidism, unspecified: Secondary | ICD-10-CM | POA: Diagnosis not present

## 2018-01-31 DIAGNOSIS — K581 Irritable bowel syndrome with constipation: Secondary | ICD-10-CM

## 2018-01-31 DIAGNOSIS — Z13220 Encounter for screening for lipoid disorders: Secondary | ICD-10-CM

## 2018-01-31 MED ORDER — GABAPENTIN 600 MG PO TABS: ORAL_TABLET | ORAL | 3 refills | Status: DC

## 2018-01-31 MED ORDER — TRAMADOL HCL 50 MG PO TABS: ORAL_TABLET | ORAL | 5 refills | Status: DC

## 2018-01-31 MED FILL — traMADol HCL 50 MG TABS: 50 | 10 days supply | Qty: 60 | Fill #0

## 2018-01-31 MED FILL — GABAPENTIN 600 MG TABS: 600 | 29 days supply | Qty: 115 | Fill #0

## 2018-01-31 NOTE — Progress Notes (Signed)
Subjective:    CC: 3 mo f/u   HPI:  Fibromyalgia- Using mobic and muslce relaxer and seems to be controlling her sxs right now.  Her insurance has her Flexeril it here for even though she is been on it for years and it works well she will need to find something a lower tears.  She said she will call me back and let me know what might be better covered.  F/U chronic insomnia -is doing okay.  Also usually takes her Flexeril at night.  Did sees Dr. Bryan Lemma in October for her irritable bowel syndrome and fecal incontinence.  He encouraged her to just continue to work on addressing her chronic constipation to help with some of the incontinence that she is been experiencing  Hypothyroidism - Taking medication regularly in the AM away from food and vitamins, etc. No recent change to skin, hair, or energy levels.  Asthma-she says she is overall doing really great.  She did get an upper respiratory virus that lasted about 2 weeks with cough and chest congestion she says she did not feel too sick as she has in the past when she had bronchitis.  She is actually feeling better.  She says during that time she only used her albuterol twice.  Past medical history, Surgical history, Family history not pertinant except as noted below, Social history, Allergies, and medications have been entered into the medical record, reviewed, and corrections made.   Review of Systems: No fevers, chills, night sweats, weight loss, chest pain, or shortness of breath.   Objective:    General: Well Developed, well nourished, and in no acute distress.  Neuro: Alert and oriented x3, extra-ocular muscles intact, sensation grossly intact.  HEENT: Normocephalic, atraumatic  Skin: Warm and dry, no rashes. Cardiac: Regular rate and rhythm, no murmurs rubs or gallops, no lower extremity edema.  Respiratory: Clear to auscultation bilaterally. Not using accessory muscles, speaking in full sentences.   Impression and  Recommendations:    Fibromyalgia -we will.  No recent flares or exacerbations she is doing pretty well on her regimen with meloxicam and Flexeril.  That we will need to try a different muscle relaxer based on her insurance coverage.   Chronic insomnia -continue regimen.  Hypothyroidism - Due to recheck TSH   Asthma, mild intermittent-continue as needed use of albuterol she is doing well and lungs are clear on exam today.

## 2018-02-03 LAB — LIPID PANEL
Cholesterol: 126 mg/dL (ref <200)
HDL: 56 mg/dL (ref >50)
LDL Cholesterol (Calc): 56 mg/dL (calc)
NON-HDL CHOLESTEROL (CALC): 70 mg/dL (ref <130)
Total CHOL/HDL Ratio: 2.3 (calc) (ref <5.0)
Triglycerides: 67 mg/dL (ref <150)

## 2018-02-03 LAB — COMPLETE METABOLIC PANEL WITH GFR
AG RATIO: 2.1 (calc) (ref 1.0–2.5)
ALT: 24 U/L (ref 6–29)
AST: 31 U/L (ref 10–35)
Albumin: 4.1 g/dL (ref 3.6–5.1)
Alkaline phosphatase (APISO): 111 U/L (ref 33–130)
BILIRUBIN TOTAL: 0.5 mg/dL (ref 0.2–1.2)
BUN: 20 mg/dL (ref 7–25)
CALCIUM: 9.7 mg/dL (ref 8.6–10.4)
CHLORIDE: 103 mmol/L (ref 98–110)
CO2: 28 mmol/L (ref 20–32)
Creat: 0.85 mg/dL (ref 0.50–0.99)
GFR, EST AFRICAN AMERICAN: 82 mL/min/{1.73_m2} (ref >=60)
GFR, EST NON AFRICAN AMERICAN: 70 mL/min/{1.73_m2} (ref >=60)
GLOBULIN: 2 g/dL (ref 1.9–3.7)
Glucose, Bld: 119 mg/dL (ref 65–139)
POTASSIUM: 4.1 mmol/L (ref 3.5–5.3)
Sodium: 138 mmol/L (ref 135–146)
TOTAL PROTEIN: 6.1 g/dL (ref 6.1–8.1)

## 2018-02-03 LAB — TSH: TSH: 1.58 m[IU]/L (ref 0.40–4.50)

## 2018-02-06 DIAGNOSIS — J301 Allergic rhinitis due to pollen: Secondary | ICD-10-CM | POA: Diagnosis not present

## 2018-02-06 DIAGNOSIS — J3089 Other allergic rhinitis: Secondary | ICD-10-CM | POA: Diagnosis not present

## 2018-02-06 DIAGNOSIS — J3081 Allergic rhinitis due to animal (cat) (dog) hair and dander: Secondary | ICD-10-CM | POA: Diagnosis not present

## 2018-02-07 ENCOUNTER — Ambulatory Visit (INDEPENDENT_AMBULATORY_CARE_PROVIDER_SITE_OTHER): Payer: Medicare Other | Admitting: Licensed Clinical Social Worker

## 2018-02-07 DIAGNOSIS — F3341 Major depressive disorder, recurrent, in partial remission: Secondary | ICD-10-CM | POA: Diagnosis not present

## 2018-02-14 ENCOUNTER — Other Ambulatory Visit: Payer: Self-pay | Admitting: Family Medicine

## 2018-02-14 MED FILL — PROAIR HFA 90 MCG INHALER: 108 (90 BAS | 25 days supply | Qty: 9 | Fill #0

## 2018-02-17 DIAGNOSIS — Z791 Long term (current) use of non-steroidal anti-inflammatories (NSAID): Secondary | ICD-10-CM | POA: Diagnosis not present

## 2018-02-17 DIAGNOSIS — M797 Fibromyalgia: Secondary | ICD-10-CM | POA: Diagnosis not present

## 2018-02-17 DIAGNOSIS — M351 Other overlap syndromes: Secondary | ICD-10-CM | POA: Diagnosis not present

## 2018-02-17 DIAGNOSIS — M150 Primary generalized (osteo)arthritis: Secondary | ICD-10-CM | POA: Diagnosis not present

## 2018-02-21 ENCOUNTER — Other Ambulatory Visit: Payer: Self-pay | Admitting: Family Medicine

## 2018-02-21 DIAGNOSIS — N952 Postmenopausal atrophic vaginitis: Secondary | ICD-10-CM

## 2018-02-21 DIAGNOSIS — H6980 Other specified disorders of Eustachian tube, unspecified ear: Secondary | ICD-10-CM | POA: Diagnosis not present

## 2018-02-21 DIAGNOSIS — J301 Allergic rhinitis due to pollen: Secondary | ICD-10-CM | POA: Diagnosis not present

## 2018-02-21 DIAGNOSIS — J4520 Mild intermittent asthma, uncomplicated: Secondary | ICD-10-CM | POA: Diagnosis not present

## 2018-02-21 DIAGNOSIS — J3081 Allergic rhinitis due to animal (cat) (dog) hair and dander: Secondary | ICD-10-CM | POA: Diagnosis not present

## 2018-02-21 DIAGNOSIS — J3089 Other allergic rhinitis: Secondary | ICD-10-CM | POA: Diagnosis not present

## 2018-02-22 MED FILL — MELOXICAM 15 MG TABLET: 15 | 30 days supply | Qty: 30 | Fill #1

## 2018-02-22 MED FILL — traZODone HCL 50 MG TABS: 50 | 45 days supply | Qty: 90 | Fill #1

## 2018-03-02 MED FILL — traMADol HCL 50 MG TABS: 50 | 10 days supply | Qty: 60 | Fill #1

## 2018-03-02 MED FILL — GABAPENTIN 600 MG TABS: 600 | 29 days supply | Qty: 115 | Fill #1

## 2018-03-03 DIAGNOSIS — N3941 Urge incontinence: Secondary | ICD-10-CM | POA: Diagnosis not present

## 2018-03-03 DIAGNOSIS — R159 Full incontinence of feces: Secondary | ICD-10-CM | POA: Diagnosis not present

## 2018-03-03 DIAGNOSIS — N952 Postmenopausal atrophic vaginitis: Secondary | ICD-10-CM | POA: Diagnosis not present

## 2018-03-03 MED FILL — ESTRADIOL 0.1 MG/GM CREA: 0.1 | 90 days supply | Qty: 43 | Fill #0

## 2018-03-06 DIAGNOSIS — D3131 Benign neoplasm of right choroid: Secondary | ICD-10-CM | POA: Diagnosis not present

## 2018-03-13 ENCOUNTER — Other Ambulatory Visit: Payer: Self-pay | Admitting: Family Medicine

## 2018-03-13 DIAGNOSIS — J3081 Allergic rhinitis due to animal (cat) (dog) hair and dander: Secondary | ICD-10-CM | POA: Diagnosis not present

## 2018-03-13 DIAGNOSIS — J301 Allergic rhinitis due to pollen: Secondary | ICD-10-CM | POA: Diagnosis not present

## 2018-03-13 DIAGNOSIS — J3089 Other allergic rhinitis: Secondary | ICD-10-CM | POA: Diagnosis not present

## 2018-03-13 MED FILL — buPROPion HCL ER (XL) 150 M: 150 | 90 days supply | Qty: 270 | Fill #1

## 2018-03-13 MED FILL — CYCLOBENZAPRINE HCL 10 MG T: 10 | 30 days supply | Qty: 60 | Fill #0

## 2018-03-22 DIAGNOSIS — R159 Full incontinence of feces: Secondary | ICD-10-CM | POA: Diagnosis not present

## 2018-03-23 ENCOUNTER — Ambulatory Visit (INDEPENDENT_AMBULATORY_CARE_PROVIDER_SITE_OTHER): Payer: Medicare Other | Admitting: Licensed Clinical Social Worker

## 2018-03-23 DIAGNOSIS — F3341 Major depressive disorder, recurrent, in partial remission: Secondary | ICD-10-CM

## 2018-03-23 MED FILL — MELOXICAM 15 MG TABLET: 15 | 30 days supply | Qty: 30 | Fill #0

## 2018-03-23 MED FILL — AMOXICILLIN 500 MG CAPSULE: 500 | 10 days supply | Qty: 16 | Fill #0

## 2018-04-03 MED FILL — traMADol HCL 50 MG TABS: 50 | 10 days supply | Qty: 60 | Fill #2

## 2018-04-03 MED FILL — GABAPENTIN 600 MG TABS: 600 | 29 days supply | Qty: 115 | Fill #2

## 2018-04-06 DIAGNOSIS — J3089 Other allergic rhinitis: Secondary | ICD-10-CM | POA: Diagnosis not present

## 2018-04-06 DIAGNOSIS — J3081 Allergic rhinitis due to animal (cat) (dog) hair and dander: Secondary | ICD-10-CM | POA: Diagnosis not present

## 2018-04-06 DIAGNOSIS — J301 Allergic rhinitis due to pollen: Secondary | ICD-10-CM | POA: Diagnosis not present

## 2018-04-07 MED FILL — SHINGRIX 50 MCG SUS: 50 | 1 days supply | Qty: 1 | Fill #0

## 2018-04-20 ENCOUNTER — Ambulatory Visit: Payer: Medicare Other | Admitting: Licensed Clinical Social Worker

## 2018-04-21 ENCOUNTER — Other Ambulatory Visit: Payer: Self-pay | Admitting: Family Medicine

## 2018-04-21 MED FILL — LEVOTHYROXINE 50 MCG TABLET: 50 | 90 days supply | Qty: 90 | Fill #0

## 2018-04-21 MED FILL — MELOXICAM 15 MG TABLET: 15 | 30 days supply | Qty: 30 | Fill #1

## 2018-04-25 DIAGNOSIS — N3941 Urge incontinence: Secondary | ICD-10-CM | POA: Diagnosis not present

## 2018-04-25 DIAGNOSIS — J301 Allergic rhinitis due to pollen: Secondary | ICD-10-CM | POA: Diagnosis not present

## 2018-04-25 DIAGNOSIS — J3089 Other allergic rhinitis: Secondary | ICD-10-CM | POA: Diagnosis not present

## 2018-04-25 DIAGNOSIS — R32 Unspecified urinary incontinence: Secondary | ICD-10-CM | POA: Diagnosis not present

## 2018-04-25 DIAGNOSIS — J3081 Allergic rhinitis due to animal (cat) (dog) hair and dander: Secondary | ICD-10-CM | POA: Diagnosis not present

## 2018-04-25 DIAGNOSIS — R159 Full incontinence of feces: Secondary | ICD-10-CM | POA: Diagnosis not present

## 2018-04-27 MED FILL — AMOXICILLIN 500 MG CAPSULE: 500 | 7 days supply | Qty: 30 | Fill #0

## 2018-04-28 DIAGNOSIS — M797 Fibromyalgia: Secondary | ICD-10-CM | POA: Diagnosis not present

## 2018-04-28 DIAGNOSIS — M351 Other overlap syndromes: Secondary | ICD-10-CM | POA: Diagnosis not present

## 2018-05-03 ENCOUNTER — Ambulatory Visit: Payer: Self-pay | Admitting: Family Medicine

## 2018-05-04 ENCOUNTER — Ambulatory Visit (INDEPENDENT_AMBULATORY_CARE_PROVIDER_SITE_OTHER): Payer: Medicare Other | Admitting: Licensed Clinical Social Worker

## 2018-05-04 DIAGNOSIS — F3341 Major depressive disorder, recurrent, in partial remission: Secondary | ICD-10-CM | POA: Diagnosis not present

## 2018-05-04 MED FILL — traMADol HCL 50 MG TABS: 50 | 10 days supply | Qty: 60 | Fill #3

## 2018-05-04 MED FILL — GABAPENTIN 600 MG TABS: 600 | 29 days supply | Qty: 115 | Fill #3

## 2018-05-15 ENCOUNTER — Ambulatory Visit (INDEPENDENT_AMBULATORY_CARE_PROVIDER_SITE_OTHER): Payer: Medicare Other | Admitting: Family Medicine

## 2018-05-15 ENCOUNTER — Encounter: Payer: Self-pay | Admitting: Family Medicine

## 2018-05-15 VITALS — BP 127/58 | HR 91 | Ht 65.5 in | Wt 119.0 lb

## 2018-05-15 DIAGNOSIS — M2041 Other hammer toe(s) (acquired), right foot: Secondary | ICD-10-CM

## 2018-05-15 DIAGNOSIS — M359 Systemic involvement of connective tissue, unspecified: Secondary | ICD-10-CM | POA: Diagnosis not present

## 2018-05-15 DIAGNOSIS — J4540 Moderate persistent asthma, uncomplicated: Secondary | ICD-10-CM

## 2018-05-15 DIAGNOSIS — M797 Fibromyalgia: Secondary | ICD-10-CM

## 2018-05-15 DIAGNOSIS — G4700 Insomnia, unspecified: Secondary | ICD-10-CM

## 2018-05-15 DIAGNOSIS — D692 Other nonthrombocytopenic purpura: Secondary | ICD-10-CM | POA: Diagnosis not present

## 2018-05-15 DIAGNOSIS — M2042 Other hammer toe(s) (acquired), left foot: Secondary | ICD-10-CM | POA: Diagnosis not present

## 2018-05-15 MED ORDER — CYCLOBENZAPRINE HCL 10 MG PO TABS: ORAL_TABLET | ORAL | 0 refills | Status: DC

## 2018-05-15 MED FILL — CYCLOBENZAPRINE HCL 10 MG T: 10 | 30 days supply | Qty: 60 | Fill #0

## 2018-05-15 NOTE — Progress Notes (Signed)
Subjective:    CC: Fibro  HPI:  Follow-up fibromyalgia- she has had a lot more joint pain recently. She says her toenails looks black. She has been seeing Dermatology.    Follow-up insomnia-overall doing well though she did take a half of an old Ambien last night because she could not get back to sleep and know she had to keep her granddaughter.  She says she never feels sedated when she wakes up in the morning on the medication.  F/U Asthma - she is currently enrolled in a research program at [facility].  She is hopeful that her pro-air will go generic later this year.  But otherwise she is doing okay.  She does have a bruise on her left outer lower leg that is actually been there for couple years.  She wanted to ask about it she keeps meaning to remember to bring it up during an office visit.  She also wanted to discuss her toes.  Several toenails have turned a most a black color.  She has seen an orthopedist for years ago and was told that she had hammertoes.  They tried treating with antifungals at one point but that did not really seem to help.  Her hammertoes have now gotten worse.  She wants to know what her treatment options are.  Says the nails will sometimes fall off on their own.   Past medical history, Surgical history, Family history not pertinant except as noted below, Social history, Allergies, and medications have been entered into the medical record, reviewed, and corrections made.   Review of Systems: No fevers, chills, night sweats, weight loss, chest pain, or shortness of breath.   Objective:    General: Well Developed, well nourished, and in no acute distress.  Neuro: Alert and oriented x3, extra-ocular muscles intact, sensation grossly intact.  HEENT: Normocephalic, atraumatic  Skin: Warm and dry, no rashes.  She does have some pigmentation over the left lower outer leg on the left leg.  He has a little trace edema as well consistent with venous stasis. Cardiac:  Regular rate and rhythm, no murmurs rubs or gallops, no lower extremity edema.  Respiratory: Clear to auscultation bilaterally. Not using accessory muscles, speaking in full sentences. MSK: Has multiple hammertoes and tailor second and third toenails on the right foot and second toe on the left foot do have an almost dark red-black look to them likely from trauma.  Part of the nail on the third toe on the right foot has peeled off.   Impression and Recommendations:    Fibromyalgia-stable.  Doing well on her current regimen does need a refill on the cyclobenzaprine.  Insomnia-overall doing okay.  She said she did take half of an Ambien last night which she does very very rarely.  But she still has a few old leftover.  Asthma - STable.    Hammertoes-she does have hammertoes and unfortunately is getting some trauma to the nail bed from walking on the edges of the toes.  Discussed that the main treatment would actually be orthopedic surgery.  Happy to refer her at some point if she is wanting a consultation.  She does have some permanent distal coloration of the skin where she had a previous bruise.  Explained that sometimes it can cause some hyperpigmentation which is permanent.  Connective tissues d/o - follows rheumatology.

## 2018-05-18 DIAGNOSIS — J3081 Allergic rhinitis due to animal (cat) (dog) hair and dander: Secondary | ICD-10-CM | POA: Diagnosis not present

## 2018-05-18 DIAGNOSIS — J301 Allergic rhinitis due to pollen: Secondary | ICD-10-CM | POA: Diagnosis not present

## 2018-05-18 DIAGNOSIS — J3089 Other allergic rhinitis: Secondary | ICD-10-CM | POA: Diagnosis not present

## 2018-05-18 MED FILL — MELOXICAM 15 MG TABLET: 15 | 30 days supply | Qty: 30 | Fill #0
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
## 2018-05-23 ENCOUNTER — Ambulatory Visit (INDEPENDENT_AMBULATORY_CARE_PROVIDER_SITE_OTHER): Payer: Medicare Other | Admitting: *Deleted

## 2018-05-23 VITALS — BP 110/62 | HR 84 | Ht 66.0 in | Wt 120.0 lb

## 2018-05-23 DIAGNOSIS — M19042 Primary osteoarthritis, left hand: Secondary | ICD-10-CM

## 2018-05-23 DIAGNOSIS — Z0000 Encounter for general adult medical examination without abnormal findings: Secondary | ICD-10-CM | POA: Diagnosis not present

## 2018-05-23 DIAGNOSIS — Z13820 Encounter for screening for osteoporosis: Secondary | ICD-10-CM

## 2018-05-23 DIAGNOSIS — M19041 Primary osteoarthritis, right hand: Secondary | ICD-10-CM | POA: Diagnosis not present

## 2018-05-23 DIAGNOSIS — Z780 Asymptomatic menopausal state: Secondary | ICD-10-CM

## 2018-05-23 NOTE — Patient Instructions (Signed)
Ms. Atchley , Thank you for taking time to come for your Medicare Wellness Visit. I appreciate your ongoing commitment to your health goals. Please review the following plan we discussed and let me know if I can assist you in the future.  Please schedule your next medicare wellness visit with me in 1 yr. Bring a copy of your living will and/or healthcare power of attorney to your next office visit. These are the goals we discussed: Goals    . Exercise 3x per week (30 min per time)     Try and get at least 3 days of walking in at 30 minutes at a time.

## 2018-05-24 MED FILL — traMADol HCL 50 MG TABS: 50 | 10 days supply | Qty: 60 | Fill #4

## 2018-05-26 MED FILL — ESTRADIOL 0.1 MG/GM CREA: 0.1 | 90 days supply | Qty: 43 | Fill #1

## 2018-05-30 ENCOUNTER — Other Ambulatory Visit: Payer: Self-pay | Admitting: Family Medicine

## 2018-05-30 ENCOUNTER — Ambulatory Visit (INDEPENDENT_AMBULATORY_CARE_PROVIDER_SITE_OTHER): Payer: Medicare Other | Admitting: Licensed Clinical Social Worker

## 2018-05-30 DIAGNOSIS — F3341 Major depressive disorder, recurrent, in partial remission: Secondary | ICD-10-CM

## 2018-05-30 MED FILL — GABAPENTIN 600 MG TABS: 600 | 30 days supply | Qty: 115 | Fill #0

## 2018-05-30 MED FILL — traZODone HCL 50 MG TABS: 50 | 45 days supply | Qty: 90 | Fill #0

## 2018-05-31 DIAGNOSIS — J301 Allergic rhinitis due to pollen: Secondary | ICD-10-CM | POA: Diagnosis not present

## 2018-05-31 DIAGNOSIS — J3089 Other allergic rhinitis: Secondary | ICD-10-CM | POA: Diagnosis not present

## 2018-05-31 DIAGNOSIS — J3081 Allergic rhinitis due to animal (cat) (dog) hair and dander: Secondary | ICD-10-CM | POA: Diagnosis not present

## 2018-06-06 ENCOUNTER — Other Ambulatory Visit: Payer: Self-pay | Admitting: Family Medicine

## 2018-06-06 MED FILL — buPROPion HCL ER (XL) 150 M: 150 | 30 days supply | Qty: 90 | Fill #0

## 2018-06-07 ENCOUNTER — Other Ambulatory Visit: Payer: Self-pay

## 2018-06-08 DIAGNOSIS — J3089 Other allergic rhinitis: Secondary | ICD-10-CM | POA: Diagnosis not present

## 2018-06-08 DIAGNOSIS — J3081 Allergic rhinitis due to animal (cat) (dog) hair and dander: Secondary | ICD-10-CM | POA: Diagnosis not present

## 2018-06-08 DIAGNOSIS — J301 Allergic rhinitis due to pollen: Secondary | ICD-10-CM | POA: Diagnosis not present

## 2018-06-20 ENCOUNTER — Other Ambulatory Visit: Payer: Self-pay

## 2018-06-20 ENCOUNTER — Ambulatory Visit: Payer: Medicare Other | Admitting: Family Medicine

## 2018-06-20 ENCOUNTER — Encounter: Payer: Self-pay | Admitting: Family Medicine

## 2018-06-20 VITALS — BP 118/50 | HR 91 | Temp 98.0°F | Ht 66.0 in | Wt 118.0 lb

## 2018-06-20 DIAGNOSIS — L821 Other seborrheic keratosis: Secondary | ICD-10-CM
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
## 2018-06-23 MED FILL — MELOXICAM 15 MG TABLET: 15 | 30 days supply | Qty: 30 | Fill #1

## 2018-06-29 MED FILL — GABAPENTIN 600 MG TABS: 600 | 30 days supply | Qty: 115 | Fill #1

## 2018-06-29 MED FILL — traMADol HCL 50 MG TABS: 50 | 10 days supply | Qty: 60 | Fill #5

## 2018-07-05 ENCOUNTER — Ambulatory Visit (INDEPENDENT_AMBULATORY_CARE_PROVIDER_SITE_OTHER): Payer: Medicare Other | Admitting: Licensed Clinical Social Worker

## 2018-07-05 DIAGNOSIS — F3341 Major depressive disorder, recurrent, in partial remission: Secondary | ICD-10-CM

## 2018-07-10 DIAGNOSIS — J301 Allergic rhinitis due to pollen: Secondary | ICD-10-CM | POA: Diagnosis not present

## 2018-07-10 DIAGNOSIS — J3089 Other allergic rhinitis: Secondary | ICD-10-CM | POA: Diagnosis not present

## 2018-07-10 DIAGNOSIS — J3081 Allergic rhinitis due to animal (cat) (dog) hair and dander: Secondary | ICD-10-CM | POA: Diagnosis not present

## 2018-07-12 MED FILL — buPROPion HCL ER (XL) 150 M: 150 | 30 days supply | Qty: 90 | Fill #1

## 2018-07-24 ENCOUNTER — Other Ambulatory Visit: Payer: Self-pay | Admitting: Family Medicine

## 2018-07-24 MED FILL — traMADol HCL 50 MG TABS: 50 | 10 days supply | Qty: 60 | Fill #0

## 2018-07-24 MED FILL — CYCLOBENZAPRINE HCL 10 MG T: 10 | 30 days supply | Qty: 60 | Fill #0

## 2018-07-24 MED FILL — LEVOTHYROXINE 50 MCG TABLET: 50 | 90 days supply | Qty: 90 | Fill #1

## 2018-07-25 DIAGNOSIS — J3089 Other allergic rhinitis: Secondary | ICD-10-CM | POA: Diagnosis not present

## 2018-07-25 DIAGNOSIS — J301 Allergic rhinitis due to pollen: Secondary | ICD-10-CM | POA: Diagnosis not present

## 2018-07-25 DIAGNOSIS — J3081 Allergic rhinitis due to animal (cat) (dog) hair and dander: Secondary | ICD-10-CM | POA: Diagnosis not present

## 2018-07-26 ENCOUNTER — Other Ambulatory Visit: Payer: Self-pay

## 2018-07-26 ENCOUNTER — Ambulatory Visit (INDEPENDENT_AMBULATORY_CARE_PROVIDER_SITE_OTHER): Payer: Medicare Other

## 2018-07-26 DIAGNOSIS — Z13820 Encounter for screening for osteoporosis: Secondary | ICD-10-CM

## 2018-07-26 DIAGNOSIS — M8589 Other specified disorders of bone density and structure, multiple sites: Secondary | ICD-10-CM | POA: Diagnosis not present

## 2018-07-26 DIAGNOSIS — M19041 Primary osteoarthritis, right hand: Secondary | ICD-10-CM

## 2018-07-26 DIAGNOSIS — M19042 Primary osteoarthritis, left hand: Secondary | ICD-10-CM

## 2018-07-26 DIAGNOSIS — Z780 Asymptomatic menopausal state: Secondary | ICD-10-CM

## 2018-07-26 DIAGNOSIS — Z0000 Encounter for general adult medical examination without abnormal findings: Secondary | ICD-10-CM | POA: Diagnosis not present

## 2018-07-28 DIAGNOSIS — M797 Fibromyalgia: Secondary | ICD-10-CM | POA: Diagnosis not present

## 2018-07-28 DIAGNOSIS — M351 Other overlap syndromes: Secondary | ICD-10-CM | POA: Diagnosis not present

## 2018-07-31 MED FILL — MELOXICAM 15 MG TABLET: 15 | 30 days supply | Qty: 30 | Fill #0

## 2018-07-31 MED FILL — GABAPENTIN 600 MG TABS: 600 | 30 days supply | Qty: 115 | Fill #2

## 2018-08-03 ENCOUNTER — Ambulatory Visit (INDEPENDENT_AMBULATORY_CARE_PROVIDER_SITE_OTHER): Payer: Medicare Other | Admitting: Licensed Clinical Social Worker

## 2018-08-03 DIAGNOSIS — F3341 Major depressive disorder, recurrent, in partial remission: Secondary | ICD-10-CM

## 2018-08-09 DIAGNOSIS — J301 Allergic rhinitis due to pollen: Secondary | ICD-10-CM | POA: Diagnosis not present

## 2018-08-09 DIAGNOSIS — J3089 Other allergic rhinitis: Secondary | ICD-10-CM | POA: Diagnosis not present

## 2018-08-09 DIAGNOSIS — J3081 Allergic rhinitis due to animal (cat) (dog) hair and dander: Secondary | ICD-10-CM | POA: Diagnosis not present

## 2018-08-10 MED FILL — buPROPion HCL ER (XL) 150 M: 150 | 30 days supply | Qty: 90 | Fill #2

## 2018-08-21 ENCOUNTER — Ambulatory Visit (INDEPENDENT_AMBULATORY_CARE_PROVIDER_SITE_OTHER): Payer: Medicare Other | Admitting: Family Medicine

## 2018-08-21 ENCOUNTER — Encounter: Payer: Self-pay | Admitting: Family Medicine

## 2018-08-21 VITALS — BP 110/52 | HR 92 | Ht 66.0 in | Wt 117.0 lb

## 2018-08-21 DIAGNOSIS — R41840 Attention and concentration deficit: Secondary | ICD-10-CM

## 2018-08-21 DIAGNOSIS — M359 Systemic involvement of connective tissue, unspecified: Secondary | ICD-10-CM | POA: Diagnosis not present

## 2018-08-21 DIAGNOSIS — J4540 Moderate persistent asthma, uncomplicated: Secondary | ICD-10-CM | POA: Diagnosis not present

## 2018-08-21 DIAGNOSIS — M797 Fibromyalgia: Secondary | ICD-10-CM | POA: Diagnosis not present

## 2018-08-21 DIAGNOSIS — F330 Major depressive disorder, recurrent, mild: Secondary | ICD-10-CM

## 2018-08-21 DIAGNOSIS — F411 Generalized anxiety disorder: Secondary | ICD-10-CM | POA: Diagnosis not present

## 2018-08-21 DIAGNOSIS — M792 Neuralgia and neuritis, unspecified: Secondary | ICD-10-CM | POA: Diagnosis not present

## 2018-08-21 MED ORDER — CYCLOBENZAPRINE HCL 10 MG PO TABS: ORAL_TABLET | ORAL | 0 refills | Status: DC

## 2018-08-21 MED FILL — CYCLOBENZAPRINE HCL 10 MG T: 10 | 30 days supply | Qty: 60 | Fill #0

## 2018-08-21 NOTE — Progress Notes (Signed)
Pt wanted to discuss when she is to take her medications. She reports that she didn't know that she was to take some at bedtime.  Dr. Kristell Wooding Boards told her that she was to take the Gabapentin at suppertime.   Also she mentioned something about needing to wear a medical bracelet for Gabapentin? And wanted to know where to get that. I told her that I wasn't aware of this. I advised her that she can either go on line or that most pharmacies offer them.  Maryruth Eve, [place]ahoma Crocker, CMA
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
## 2018-08-22 ENCOUNTER — Encounter: Payer: Self-pay | Admitting: Family Medicine

## 2018-08-22 MED ORDER — GABAPENTIN 600 MG PO TABS: ORAL_TABLET | ORAL | 5 refills | Status: DC

## 2018-08-22 MED FILL — GABAPENTIN 600 MG TABS: 600 | 30 days supply | Qty: 135 | Fill #0

## 2018-08-22 NOTE — Assessment & Plan Note (Signed)
Following with rheumatology now.

## 2018-08-22 NOTE — Assessment & Plan Note (Signed)
We discussed that typically if you are using albuterol more frequently, 3 days or more per week that the recommendation would be to start a inhaled corticosteroid for about 2 months to better control symptoms.  She was hesitant and wanted to think about it and will just let me know.  She is also currently enrolled in a study at [facility] so she may need to speak to them about the issue as well I am not sure.

## 2018-08-22 NOTE — Assessment & Plan Note (Signed)
Since she currently takes 2 gabapentin in the morning and 2 in the evening she wants to be able take an extra half a tab if needed.  We will rewrite her prescription and update that.

## 2018-08-22 NOTE — Assessment & Plan Note (Signed)
Gad 7 score of 4 today.

## 2018-08-22 NOTE — Assessment & Plan Note (Addendum)
PHQ 9 score of 8 today.  No thoughts of wanting to harm herself.  Doing well in regards to mood overall a little bit stressed with taking care of her grandchildren and feeling sudden by COVID but overall she is actually been doing well.  Continue with Wellbutrin.  She is also concerned about her difficulty with inattention so we did discuss that there are options to get her tested for adult ADD if she would like to pursue it.

## 2018-08-22 NOTE — Assessment & Plan Note (Signed)
Doing well on her current regimen she is on gabapentin and also on tramadol.  Will send refill when due to pharmacy.

## 2018-08-23 ENCOUNTER — Telehealth: Payer: Self-pay

## 2018-08-23 NOTE — Telephone Encounter (Signed)
Patient calls concerned that she may have been possibly exposed to [place].  Patient's granddaughter has a caregiver, and caregiver's husband tested positive. Although patient has not been in direct contact with caregiver, her husband, nor her granddaughter.. she has been around the rest of her family who have been around caregiver.   Patient is concerned about what her possibility of exposure is, and what she should do from here.

## 2018-08-23 NOTE — Telephone Encounter (Signed)
I would recommend that she try to self quarantine for the next 3 days if possible to see if she develops any symptoms.  If at any point she feels like she is getting fever, cough, shortness of breath, sore throat, or GI symptoms such as diarrhea and/or vomiting then please call us back so that we can get her referred for testing.

## 2018-08-23 NOTE — Telephone Encounter (Signed)
Pt advised, will call back if develops any SX

## 2018-08-28 MED FILL — MELOXICAM 15 MG TABLET: 15 | 30 days supply | Qty: 30 | Fill #1

## 2018-08-28 MED FILL — traMADol HCL 50 MG TABS: 50 | 10 days supply | Qty: 60 | Fill #1

## 2018-08-29 DIAGNOSIS — J301 Allergic rhinitis due to pollen: Secondary | ICD-10-CM | POA: Diagnosis not present

## 2018-08-29 DIAGNOSIS — J3089 Other allergic rhinitis: Secondary | ICD-10-CM | POA: Diagnosis not present

## 2018-08-29 DIAGNOSIS — J3081 Allergic rhinitis due to animal (cat) (dog) hair and dander: Secondary | ICD-10-CM | POA: Diagnosis not present

## 2018-08-30 ENCOUNTER — Ambulatory Visit (INDEPENDENT_AMBULATORY_CARE_PROVIDER_SITE_OTHER): Payer: Medicare Other | Admitting: Licensed Clinical Social Worker

## 2018-08-30 DIAGNOSIS — F3341 Major depressive disorder, recurrent, in partial remission: Secondary | ICD-10-CM

## 2018-08-31 ENCOUNTER — Encounter: Payer: Self-pay | Admitting: Family Medicine

## 2018-08-31 NOTE — Telephone Encounter (Signed)
Please call imaging and find out why they have not resulted her report.  It is done in the system and it says exam ended but there is no actual report attached to it.  We actually already called him about this once last month but I still have not seen the report come through.  If they can get it into the system and fax [place] a copy of report to our office that would be fantastic.  Also please let patient know when her last physical was.  She probably needs a Medicare wellness exam as well.

## 2018-09-01 NOTE — Telephone Encounter (Signed)
Larene Beach in imaging is looking into the report. Bonnita Nasuti is keeping an eye out for the report.

## 2018-09-07 DIAGNOSIS — D3131 Benign neoplasm of right choroid: Secondary | ICD-10-CM | POA: Diagnosis not present

## 2018-09-07 DIAGNOSIS — H5203 Hypermetropia, bilateral: Secondary | ICD-10-CM | POA: Diagnosis not present

## 2018-09-07 DIAGNOSIS — H2513 Age-related nuclear cataract, bilateral: Secondary | ICD-10-CM | POA: Diagnosis not present

## 2018-09-07 MED ORDER — ALENDRONATE SODIUM 70 MG PO TABS: 70.0000 mg | ORAL_TABLET | ORAL | 11 refills | Status: DC

## 2018-09-07 MED FILL — ALENDRONATE NA 70 MG TAB: 70 | 28 days supply | Qty: 4 | Fill #0

## 2018-09-08 MED FILL — buPROPion HCL ER (XL) 150 M: 150 | 30 days supply | Qty: 90 | Fill #3

## 2018-09-12 DIAGNOSIS — J3081 Allergic rhinitis due to animal (cat) (dog) hair and dander: Secondary | ICD-10-CM | POA: Diagnosis not present

## 2018-09-12 DIAGNOSIS — J3089 Other allergic rhinitis: Secondary | ICD-10-CM | POA: Diagnosis not present

## 2018-09-12 DIAGNOSIS — J301 Allergic rhinitis due to pollen: Secondary | ICD-10-CM | POA: Diagnosis not present

## 2018-09-26 ENCOUNTER — Ambulatory Visit (INDEPENDENT_AMBULATORY_CARE_PROVIDER_SITE_OTHER): Payer: Medicare Other | Admitting: Licensed Clinical Social Worker

## 2018-09-26 DIAGNOSIS — F3341 Major depressive disorder, recurrent, in partial remission: Secondary | ICD-10-CM | POA: Diagnosis not present

## 2018-09-27 MED FILL — traZODone HCL 50 MG TABS: 50 | 45 days supply | Qty: 90 | Fill #1

## 2018-09-27 MED FILL — traMADol HCL 50 MG TABS: 50 | 10 days supply | Qty: 60 | Fill #2

## 2018-09-27 MED FILL — MELOXICAM 15 MG TABLET: 15 | 30 days supply | Qty: 30 | Fill #0

## 2018-09-28 MED FILL — GABAPENTIN 600 MG TABS: 600 | 30 days supply | Qty: 135 | Fill #1

## 2018-10-03 DIAGNOSIS — J301 Allergic rhinitis due to pollen: Secondary | ICD-10-CM | POA: Diagnosis not present

## 2018-10-03 DIAGNOSIS — J3089 Other allergic rhinitis: Secondary | ICD-10-CM | POA: Diagnosis not present

## 2018-10-03 DIAGNOSIS — J3081 Allergic rhinitis due to animal (cat) (dog) hair and dander: Secondary | ICD-10-CM | POA: Diagnosis not present

## 2018-10-05 MED FILL — ALENDRONATE NA 70 MG TAB: 70 | 28 days supply | Qty: 4 | Fill #1

## 2018-10-12 MED FILL — buPROPion HCL ER (XL) 150 M: 150 | 30 days supply | Qty: 90 | Fill #4

## 2018-10-17 DIAGNOSIS — J3089 Other allergic rhinitis: Secondary | ICD-10-CM | POA: Diagnosis not present

## 2018-10-17 DIAGNOSIS — J3081 Allergic rhinitis due to animal (cat) (dog) hair and dander: Secondary | ICD-10-CM | POA: Diagnosis not present

## 2018-10-17 DIAGNOSIS — J301 Allergic rhinitis due to pollen: Secondary | ICD-10-CM | POA: Diagnosis not present

## 2018-10-24 ENCOUNTER — Ambulatory Visit: Payer: Self-pay | Admitting: Licensed Clinical Social Worker

## 2018-10-25 ENCOUNTER — Ambulatory Visit (INDEPENDENT_AMBULATORY_CARE_PROVIDER_SITE_OTHER): Payer: Medicare Other | Admitting: Licensed Clinical Social Worker

## 2018-10-25 ENCOUNTER — Other Ambulatory Visit: Payer: Self-pay | Admitting: Family Medicine

## 2018-10-25 DIAGNOSIS — F3341 Major depressive disorder, recurrent, in partial remission: Secondary | ICD-10-CM | POA: Diagnosis not present

## 2018-10-25 MED FILL — LEVOTHYROXINE 50 MCG TABLET: 50 | 90 days supply | Qty: 90 | Fill #0

## 2018-10-26 ENCOUNTER — Other Ambulatory Visit: Payer: Self-pay | Admitting: Family Medicine

## 2018-10-26 MED FILL — CYCLOBENZAPRINE HCL 10 MG T: 10 | 30 days supply | Qty: 60 | Fill #0

## 2018-10-26 MED FILL — traMADol HCL 50 MG TABS: 50 | 10 days supply | Qty: 60 | Fill #3

## 2018-10-26 MED FILL — MELOXICAM 15 MG TABLET: 15 | 30 days supply | Qty: 30 | Fill #1

## 2018-10-31 DIAGNOSIS — J3089 Other allergic rhinitis: Secondary | ICD-10-CM | POA: Diagnosis not present

## 2018-10-31 DIAGNOSIS — J3081 Allergic rhinitis due to animal (cat) (dog) hair and dander: Secondary | ICD-10-CM | POA: Diagnosis not present

## 2018-10-31 DIAGNOSIS — J301 Allergic rhinitis due to pollen: Secondary | ICD-10-CM | POA: Diagnosis not present

## 2018-11-03 MED FILL — ALENDRONATE NA 70 MG TAB: 70 | 28 days supply | Qty: 4 | Fill #2

## 2018-11-03 MED FILL — GABAPENTIN 600 MG TABS: 600 | 30 days supply | Qty: 135 | Fill #2

## 2018-11-07 DIAGNOSIS — M351 Other overlap syndromes: Secondary | ICD-10-CM | POA: Diagnosis not present

## 2018-11-07 DIAGNOSIS — M797 Fibromyalgia: Secondary | ICD-10-CM | POA: Diagnosis not present

## 2018-11-09 MED FILL — buPROPion HCL ER (XL) 150 M: 150 | 30 days supply | Qty: 90 | Fill #5

## 2018-11-14 DIAGNOSIS — J3081 Allergic rhinitis due to animal (cat) (dog) hair and dander: Secondary | ICD-10-CM | POA: Diagnosis not present

## 2018-11-14 DIAGNOSIS — J301 Allergic rhinitis due to pollen: Secondary | ICD-10-CM | POA: Diagnosis not present

## 2018-11-14 DIAGNOSIS — J3089 Other allergic rhinitis: Secondary | ICD-10-CM | POA: Diagnosis not present

## 2018-11-21 ENCOUNTER — Ambulatory Visit (INDEPENDENT_AMBULATORY_CARE_PROVIDER_SITE_OTHER): Payer: Medicare Other | Admitting: Family Medicine

## 2018-11-21 ENCOUNTER — Other Ambulatory Visit: Payer: Self-pay

## 2018-11-21 ENCOUNTER — Encounter: Payer: Self-pay | Admitting: Family Medicine

## 2018-11-21 ENCOUNTER — Telehealth: Payer: Self-pay | Admitting: Family Medicine

## 2018-11-21 VITALS — BP 113/51 | HR 87 | Temp 98.3°F | Ht 66.0 in | Wt 118.0 lb

## 2018-11-21 DIAGNOSIS — R41840 Attention and concentration deficit: Secondary | ICD-10-CM

## 2018-11-21 DIAGNOSIS — M810 Age-related osteoporosis without current pathological fracture: Secondary | ICD-10-CM | POA: Diagnosis not present

## 2018-11-21 DIAGNOSIS — J4540 Moderate persistent asthma, uncomplicated: Secondary | ICD-10-CM

## 2018-11-21 DIAGNOSIS — L659 Nonscarring hair loss, unspecified: Secondary | ICD-10-CM

## 2018-11-21 DIAGNOSIS — Z23 Encounter for immunization: Secondary | ICD-10-CM | POA: Diagnosis not present

## 2018-11-21 DIAGNOSIS — K2100 Gastro-esophageal reflux disease with esophagitis, without bleeding: Secondary | ICD-10-CM

## 2018-11-21 DIAGNOSIS — Z1231 Encounter for screening mammogram for malignant neoplasm of breast: Secondary | ICD-10-CM

## 2018-11-21 DIAGNOSIS — E039 Hypothyroidism, unspecified: Secondary | ICD-10-CM | POA: Diagnosis not present

## 2018-11-21 DIAGNOSIS — M359 Systemic involvement of connective tissue, unspecified: Secondary | ICD-10-CM | POA: Diagnosis not present

## 2018-11-21 DIAGNOSIS — M797 Fibromyalgia: Secondary | ICD-10-CM | POA: Diagnosis not present

## 2018-11-21 NOTE — Assessment & Plan Note (Addendum)
New start fosamax.  So far tolerating well. Can consider Prolia if she would like. Taking calcium and vitamin D.  T score -2.4 from May 2020.  Repeat bone density May 2022.  also discussed the importance of weightbearing exercise 5 days/week.

## 2018-11-21 NOTE — Assessment & Plan Note (Signed)
Continue to work on avoiding triggers as much as possible.  Continue with daily PPI.  If at any point she feels the Fosamax is aggravating her symptoms then please let us know
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
## 2018-11-21 NOTE — Assessment & Plan Note (Signed)
Options.  Recommend that she restart the minoxidil 5%.  We will also go ahead and place dermatology referral.  Also discussed possibly starting finasteride.  She will hold off until she consults with dermatology.

## 2018-11-21 NOTE — Assessment & Plan Note (Signed)
We discussed that typically using her rescue inhaler more than twice a week indicates that she really needs to be on a controller.  She was on one for quite a long time and was eventually stopped by pulmonology.  She does have an appointment coming up soon and says she will discuss it with them at that time.

## 2018-11-21 NOTE — Assessment & Plan Note (Signed)
We due for repeat blood work in November.

## 2018-11-21 NOTE — Assessment & Plan Note (Signed)
Stable on current regimen. No changes today.  

## 2018-11-21 NOTE — Telephone Encounter (Signed)
Please call patient and let her know that the last mammogram that I have on file for her was August 2019 so it has been a year unless she has gone somewhere else besides our facility.  We are more than happy to get a new order placed for her to go at her convenience.

## 2018-11-22 ENCOUNTER — Ambulatory Visit (INDEPENDENT_AMBULATORY_CARE_PROVIDER_SITE_OTHER): Payer: Medicare Other | Admitting: Licensed Clinical Social Worker

## 2018-11-22 DIAGNOSIS — F3341 Major depressive disorder, recurrent, in partial remission: Secondary | ICD-10-CM

## 2018-11-22 NOTE — Telephone Encounter (Signed)
Called pt and she stated that this was indeed done, she states that she had put it off due to [place].    I called radiology and Larene Beach and Junie Panning checked on this and there was no record of this being done..  Called pt back and she informed me that she realized that she had NOT had this done last year. I told her that I was going to place the order and they will call her.Maryruth Eve, [place] Crocker, CMA

## 2018-11-24 MED FILL — MELOXICAM 15 MG TABLET: 15 | 30 days supply | Qty: 30 | Fill #0

## 2018-11-27 MED FILL — ALENDRONATE NA 70 MG TAB: 70 | 28 days supply | Qty: 4 | Fill #3

## 2018-11-27 MED FILL — traMADol HCL 50 MG TABS: 50 | 10 days supply | Qty: 60 | Fill #4

## 2018-11-29 DIAGNOSIS — L638 Other alopecia areata: Secondary | ICD-10-CM | POA: Diagnosis not present

## 2018-11-29 DIAGNOSIS — J301 Allergic rhinitis due to pollen: Secondary | ICD-10-CM | POA: Diagnosis not present

## 2018-11-29 DIAGNOSIS — J3081 Allergic rhinitis due to animal (cat) (dog) hair and dander: Secondary | ICD-10-CM | POA: Diagnosis not present

## 2018-11-29 DIAGNOSIS — J3089 Other allergic rhinitis: Secondary | ICD-10-CM | POA: Diagnosis not present

## 2018-12-04 ENCOUNTER — Other Ambulatory Visit: Payer: Self-pay | Admitting: Family Medicine

## 2018-12-04 MED FILL — FINASTERIDE 5 MG TABLET: 5 | 30 days supply | Qty: 15 | Fill #0

## 2018-12-04 MED FILL — buPROPion HCL ER (XL) 150 M: 150 | 30 days supply | Qty: 90 | Fill #0

## 2018-12-06 MED FILL — GABAPENTIN 600 MG TABS: 600 | 30 days supply | Qty: 135 | Fill #3

## 2018-12-18 DIAGNOSIS — J3081 Allergic rhinitis due to animal (cat) (dog) hair and dander: Secondary | ICD-10-CM | POA: Diagnosis not present

## 2018-12-18 DIAGNOSIS — J301 Allergic rhinitis due to pollen: Secondary | ICD-10-CM | POA: Diagnosis not present

## 2018-12-18 DIAGNOSIS — J3089 Other allergic rhinitis: Secondary | ICD-10-CM | POA: Diagnosis not present

## 2018-12-19 ENCOUNTER — Ambulatory Visit (INDEPENDENT_AMBULATORY_CARE_PROVIDER_SITE_OTHER): Payer: Medicare Other | Admitting: Licensed Clinical Social Worker

## 2018-12-19 DIAGNOSIS — F3341 Major depressive disorder, recurrent, in partial remission: Secondary | ICD-10-CM

## 2018-12-20 ENCOUNTER — Ambulatory Visit: Payer: Medicare Other

## 2018-12-22 ENCOUNTER — Ambulatory Visit (HOSPITAL_BASED_OUTPATIENT_CLINIC_OR_DEPARTMENT_OTHER)
Admission: RE | Admit: 2018-12-22 | Discharge: 2018-12-22 | Disposition: A | Discharge status: Home / Self Care | Payer: Medicare Other | Source: Ambulatory Visit | Attending: Family Medicine | Admitting: Family Medicine

## 2018-12-22 ENCOUNTER — Other Ambulatory Visit: Payer: Self-pay

## 2018-12-22 DIAGNOSIS — Z1231 Encounter for screening mammogram for malignant neoplasm of breast: Secondary | ICD-10-CM | POA: Diagnosis not present

## 2018-12-22 IMAGING — MG MM DIGITAL SCREENING BILAT W/ TOMO W/ CAD
8 series · 9 of 24 positions shown · non-contrast
Comparison: Previous exam(s).

CLINICAL DATA: Screening.

EXAM:
DIGITAL SCREENING BILATERAL MAMMOGRAM WITH TOMO AND CAD

[R MLO synth-2D]
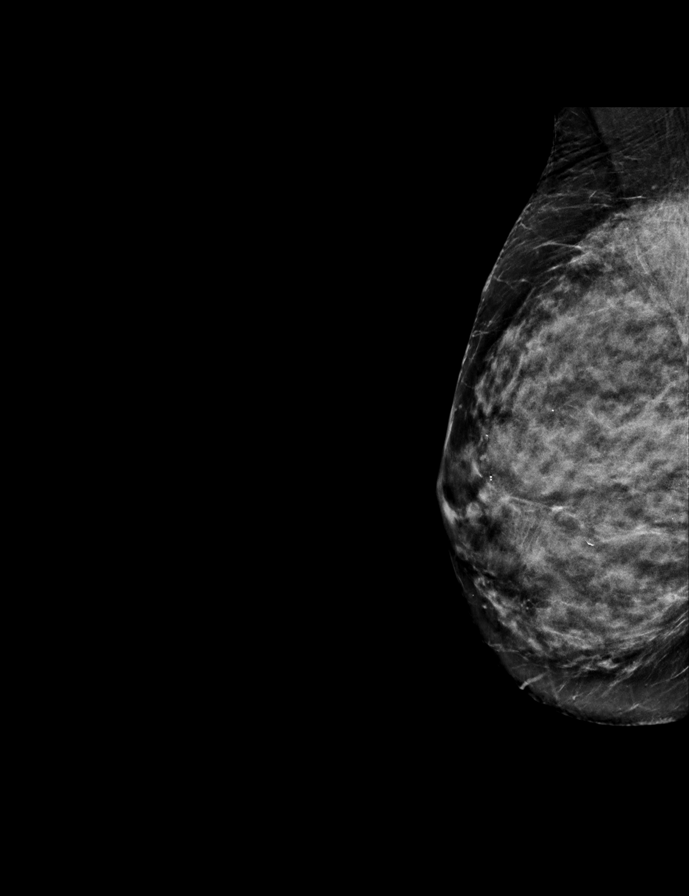

[R CC synth-2D]
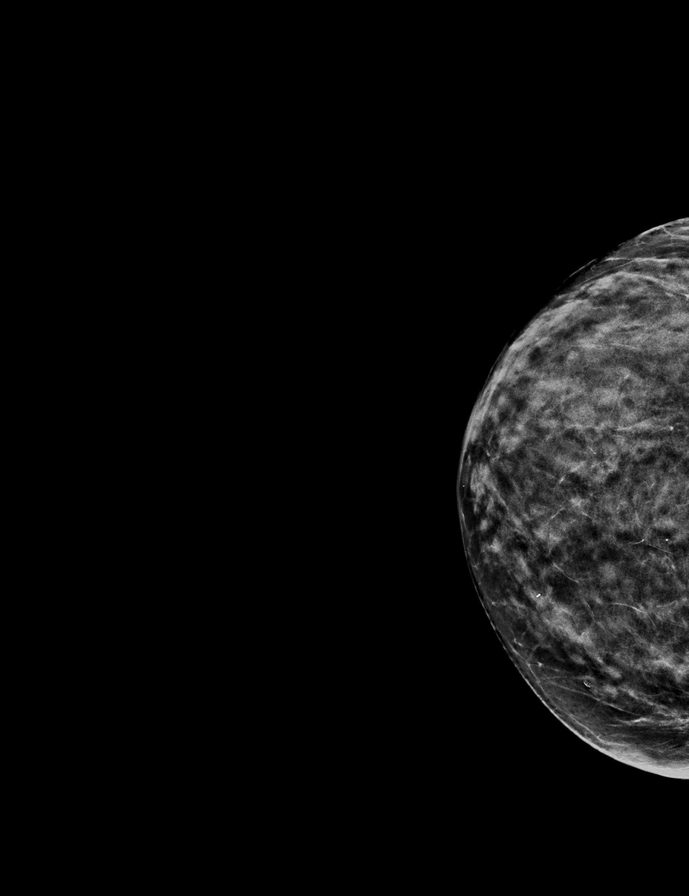

[L MLO synth-2D]
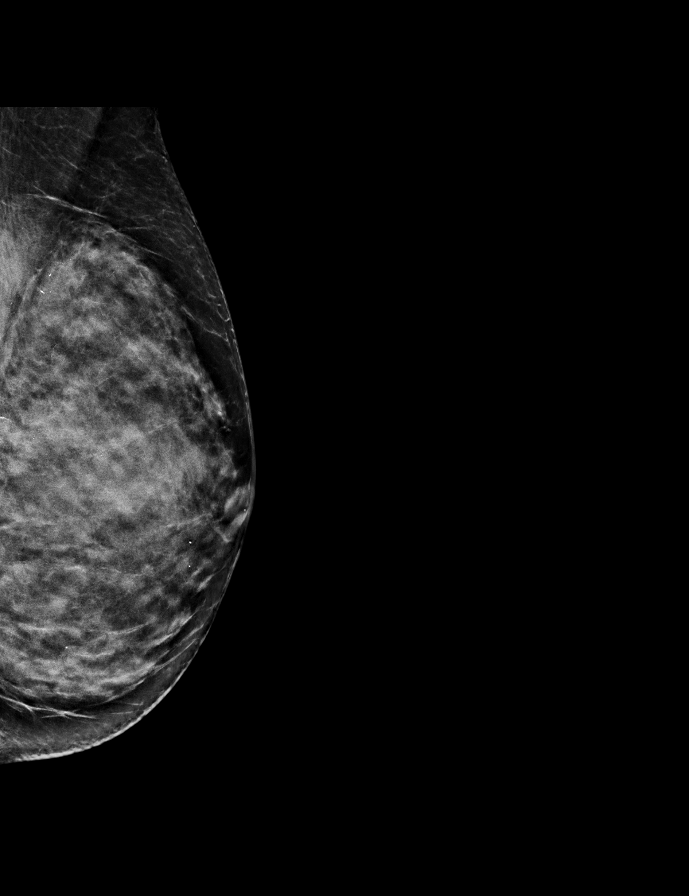

[L CC synth-2D]
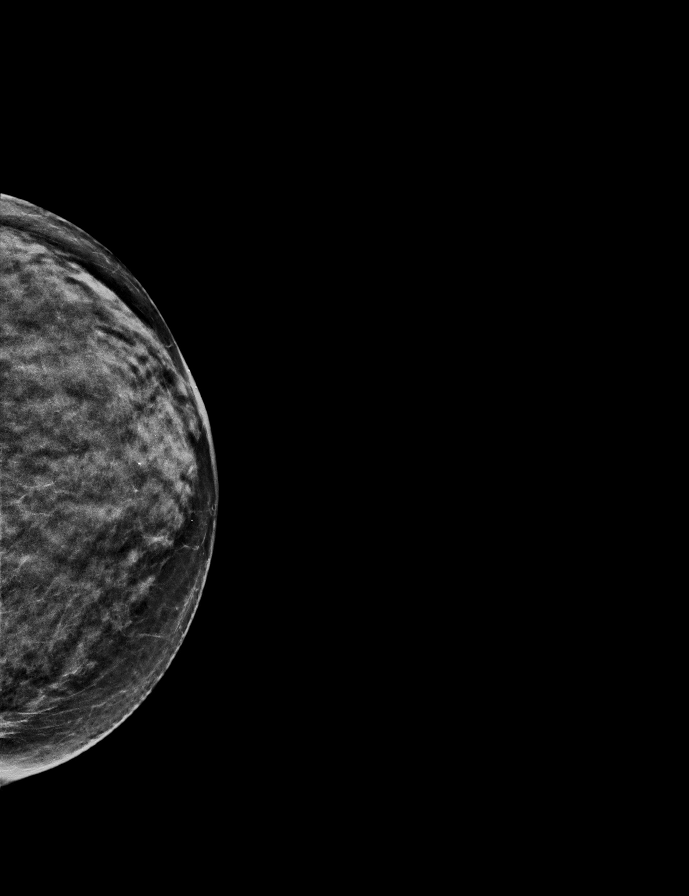

[R CC tomo · 2 of 49 frames shown]
[frame 16/49]
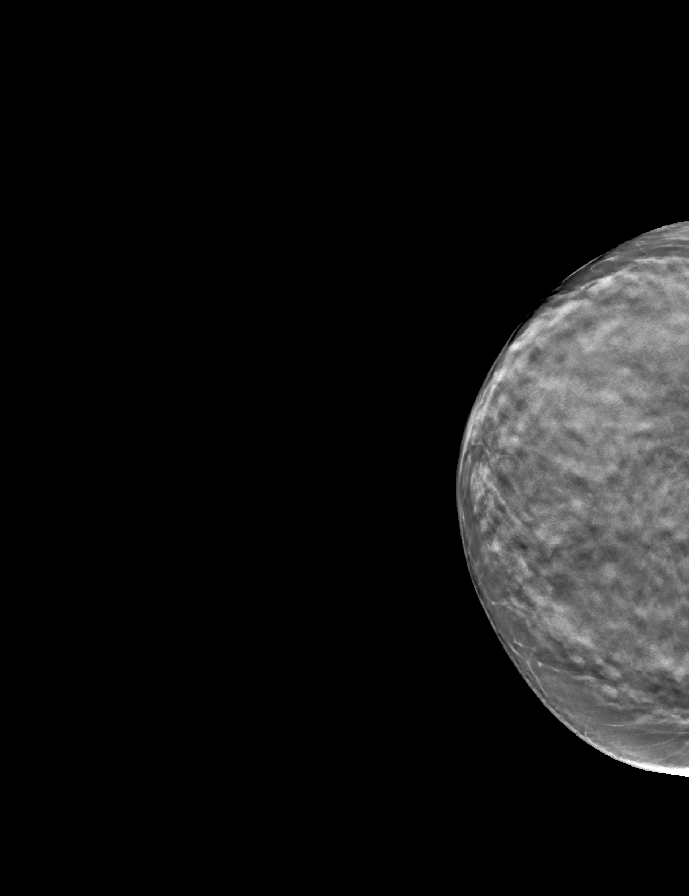
[frame 25/49]
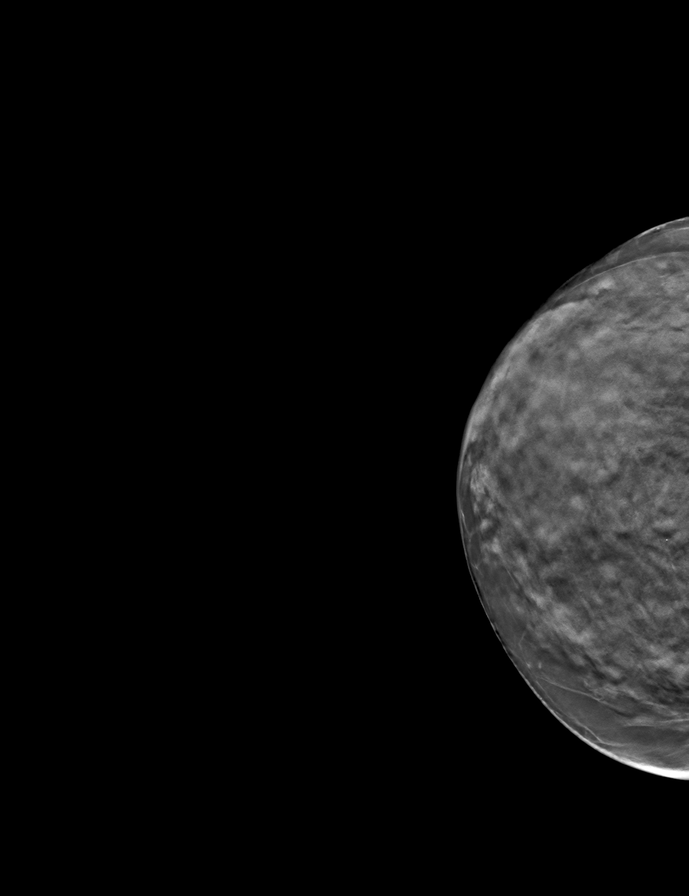

[R MLO tomo · tomo slice 29/56.0]
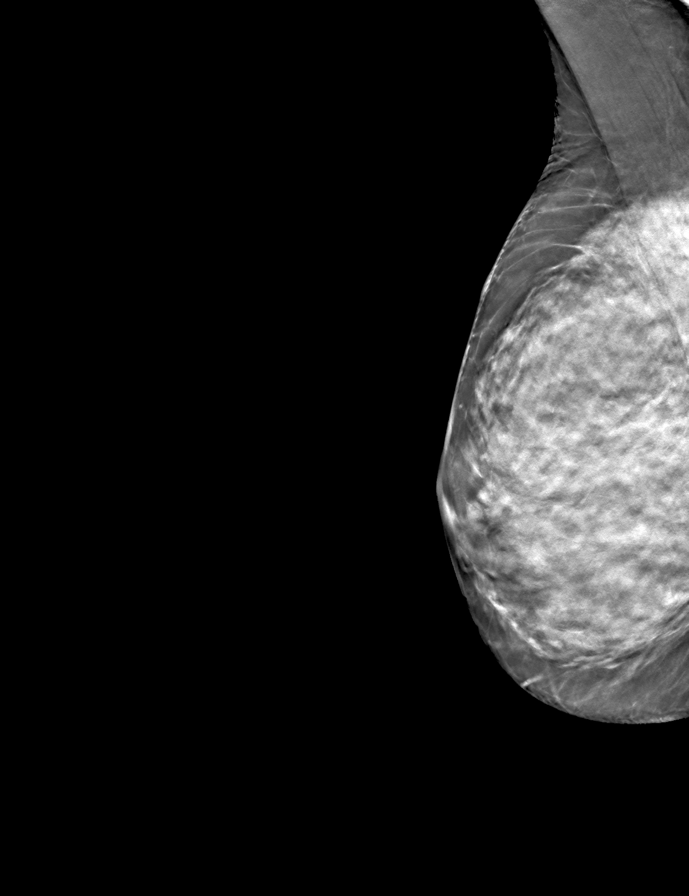

[L MLO tomo · tomo slice 29/58.0]
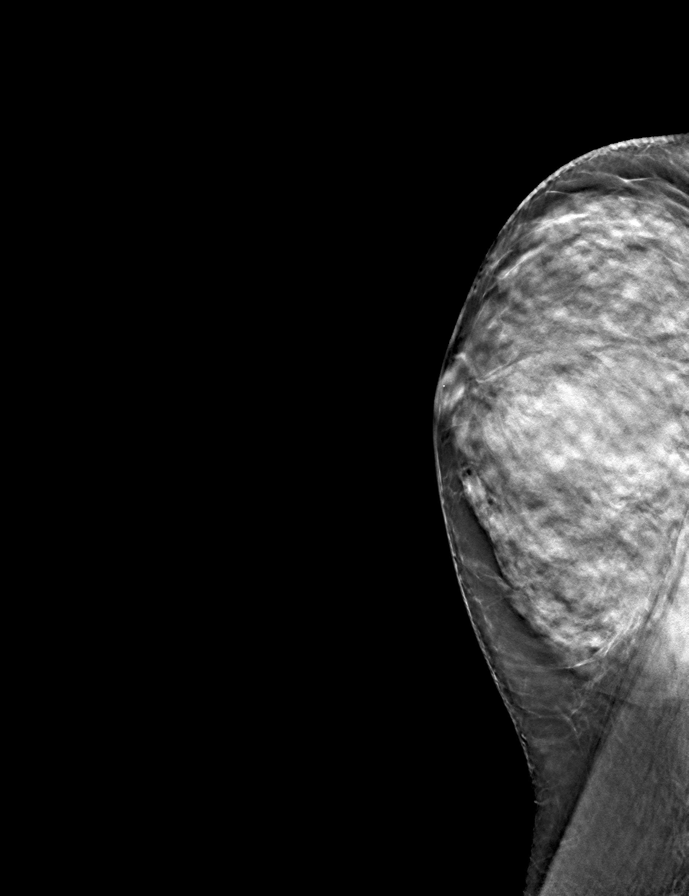

[L CC tomo · tomo slice 25/49.0]
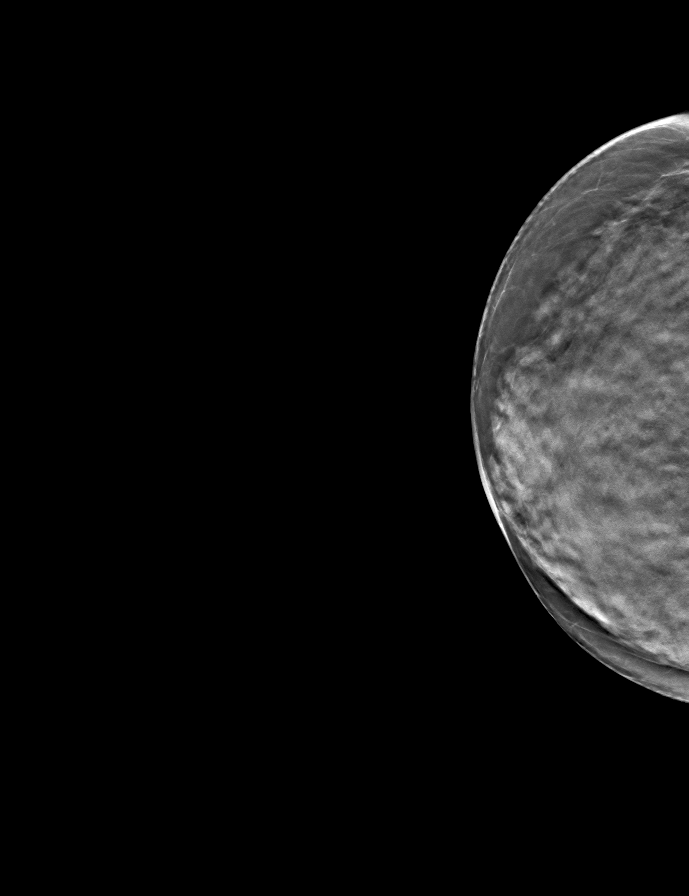

[9 of 24 positions shown; findings below may reference images not displayed]

ACR Breast Density Category d: The breast tissue is extremely dense,
which lowers the sensitivity of mammography.
FINDINGS: In the left breast, calcifications warrant further evaluation. In
the right breast, no findings suspicious for malignancy. Images were
processed with CAD.
IMPRESSION: Further evaluation is suggested for calcifications in the left
breast.

RECOMMENDATION:
Diagnostic mammogram of the left breast. (Code:[QT])

The patient will be contacted regarding the findings, and additional
imaging will be scheduled.

BI-RADS CATEGORY  0: Incomplete. Need additional imaging evaluation
and/or prior mammograms for comparison.

## 2018-12-26 ENCOUNTER — Other Ambulatory Visit: Payer: Self-pay | Admitting: Family Medicine

## 2018-12-26 DIAGNOSIS — R928 Other abnormal and inconclusive findings on diagnostic imaging of breast: Secondary | ICD-10-CM

## 2018-12-28 MED FILL — traMADol HCL 50 MG TABS: 50 | 10 days supply | Qty: 60 | Fill #5

## 2018-12-28 MED FILL — ALENDRONATE NA 70 MG TAB: 70 | 28 days supply | Qty: 4 | Fill #4

## 2018-12-28 MED FILL — MELOXICAM 15 MG TABLET: 15 | 30 days supply | Qty: 30 | Fill #1

## 2018-12-29 ENCOUNTER — Ambulatory Visit
Admission: RE | Admit: 2018-12-29 | Discharge: 2018-12-29 | Disposition: A | Discharge status: Home / Self Care | Payer: Medicare Other | Source: Ambulatory Visit | Attending: Family Medicine | Admitting: Family Medicine

## 2018-12-29 ENCOUNTER — Other Ambulatory Visit: Payer: Self-pay | Admitting: Family Medicine

## 2018-12-29 ENCOUNTER — Other Ambulatory Visit: Payer: Self-pay

## 2018-12-29 DIAGNOSIS — R921 Mammographic calcification found on diagnostic imaging of breast: Secondary | ICD-10-CM | POA: Diagnosis not present

## 2018-12-29 DIAGNOSIS — R928 Other abnormal and inconclusive findings on diagnostic imaging of breast: Secondary | ICD-10-CM

## 2018-12-29 IMAGING — MG DIGITAL DIAGNOSTIC UNILAT LEFT W/ CAD
6 series · 6 of 6 positions shown · non-contrast
Comparison: Previous exam(s).
COMPARISON: Previous exam(s).

Addendum:
CLINICAL DATA: Left breast

EXAM:
DIGITAL DIAGNOSTIC LEFT MAMMOGRAM WITH CAD

[L XCCL (1 of 2)]
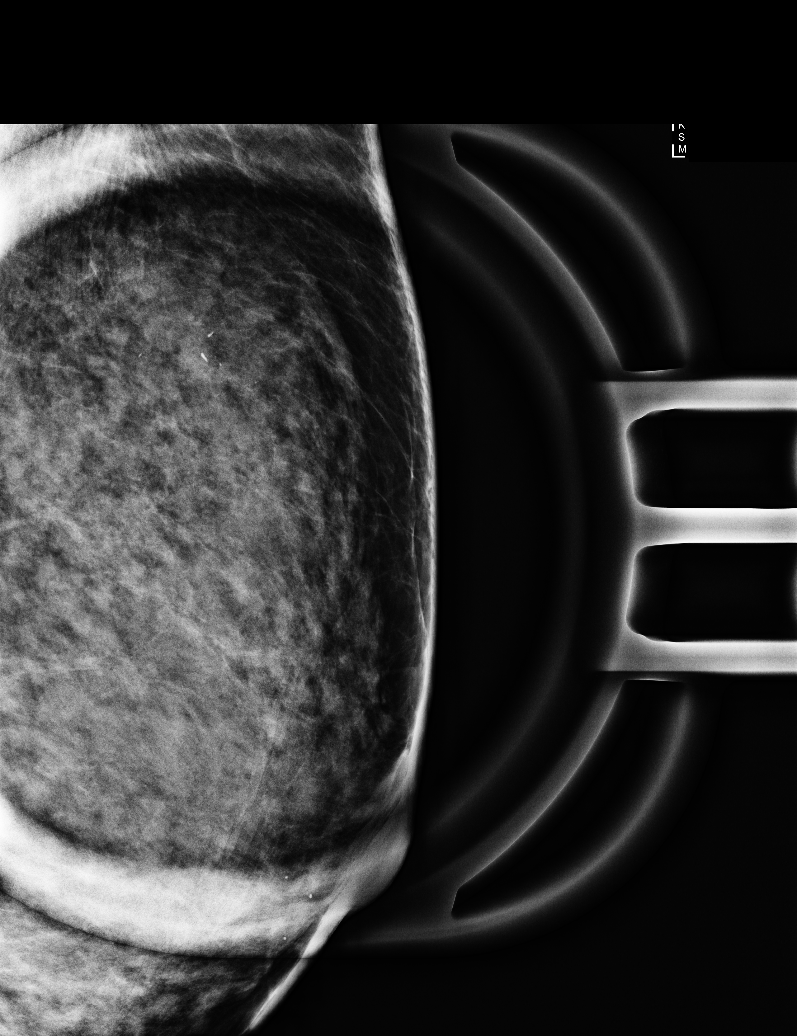

[L ML (1 of 3)]
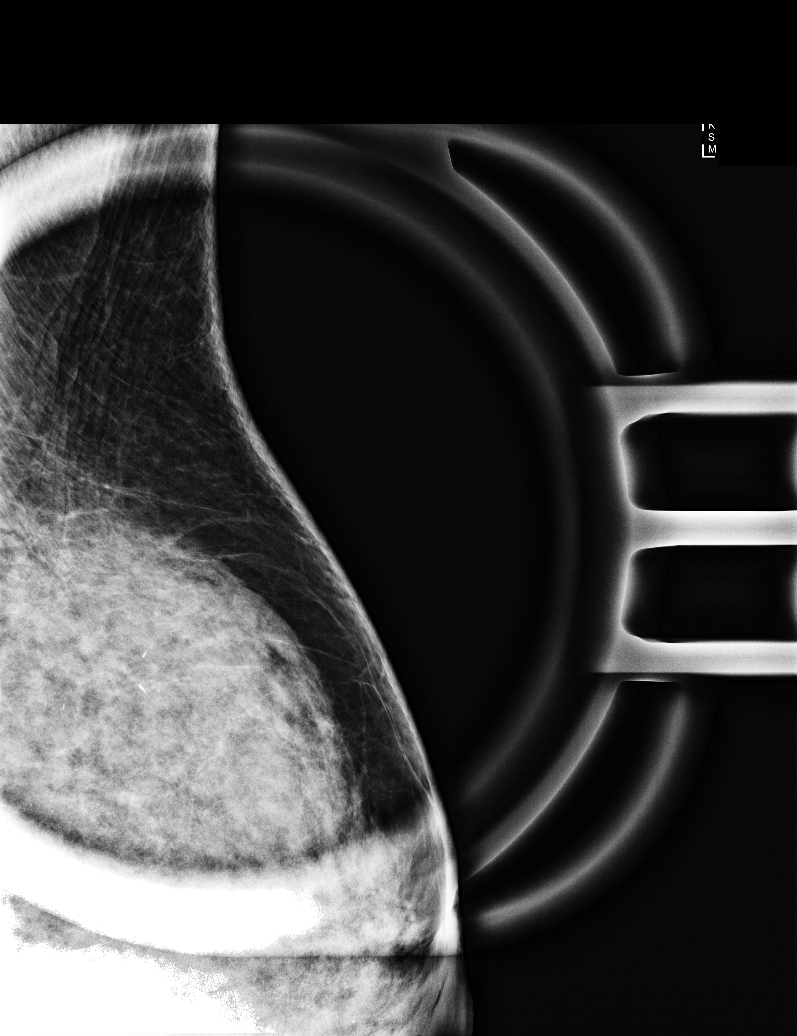

[L CC]
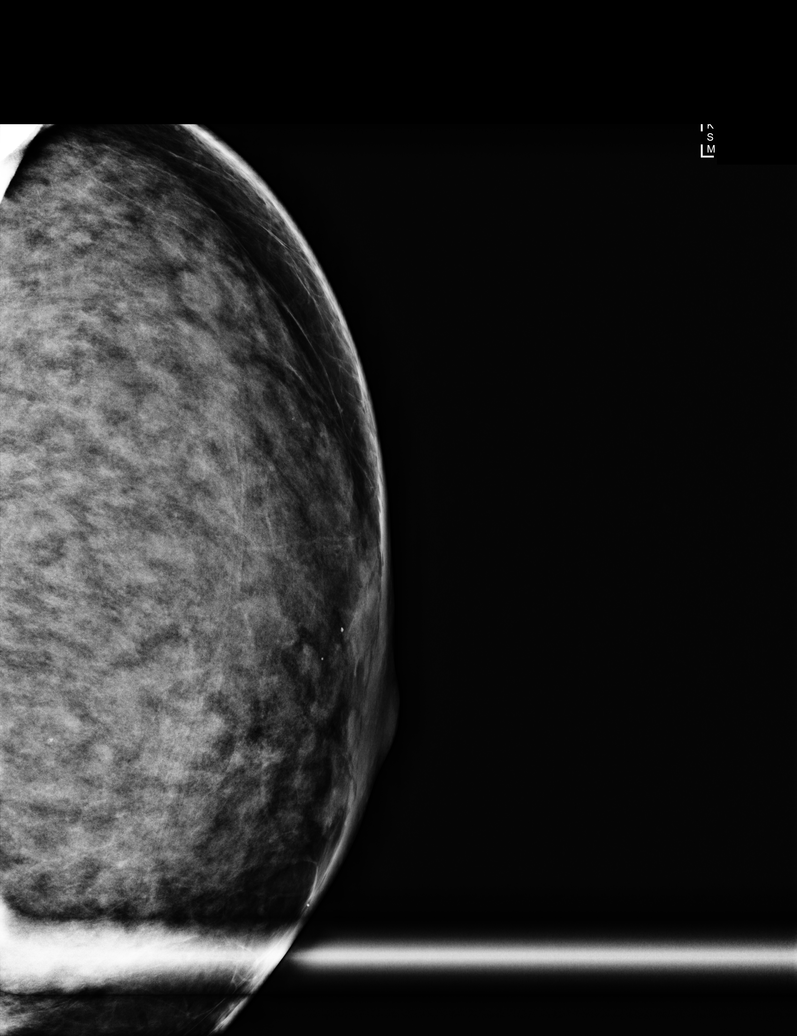

[L ML (2 of 3)]
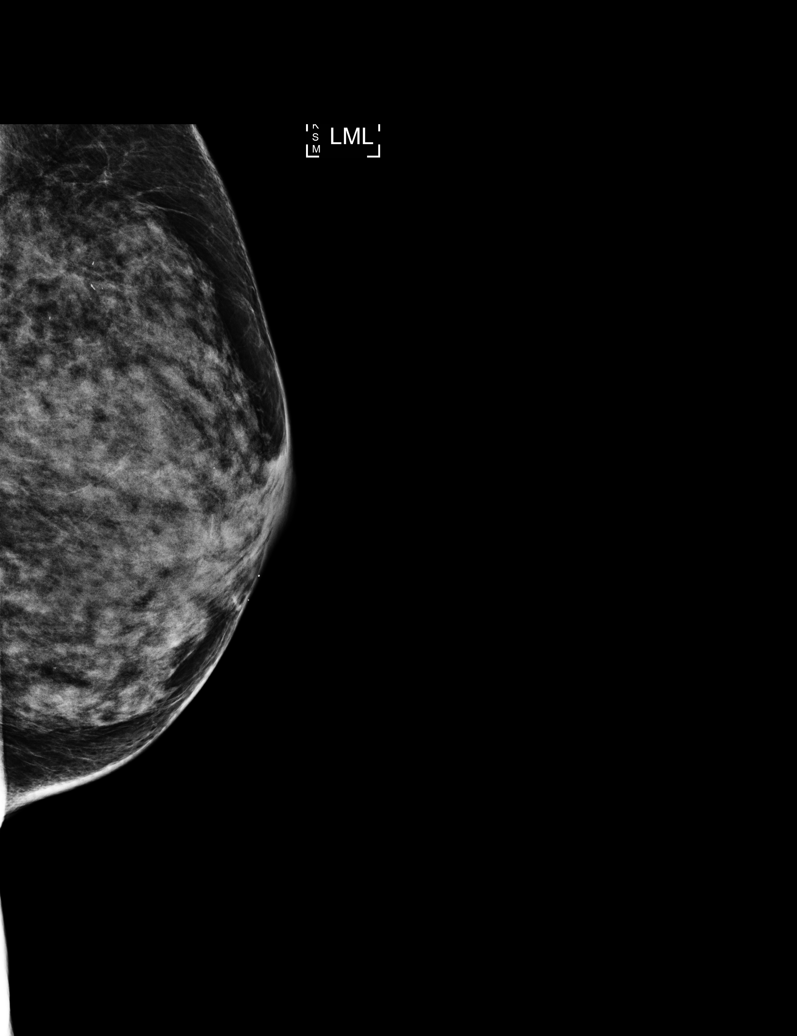

[L XCCL (2 of 2)]
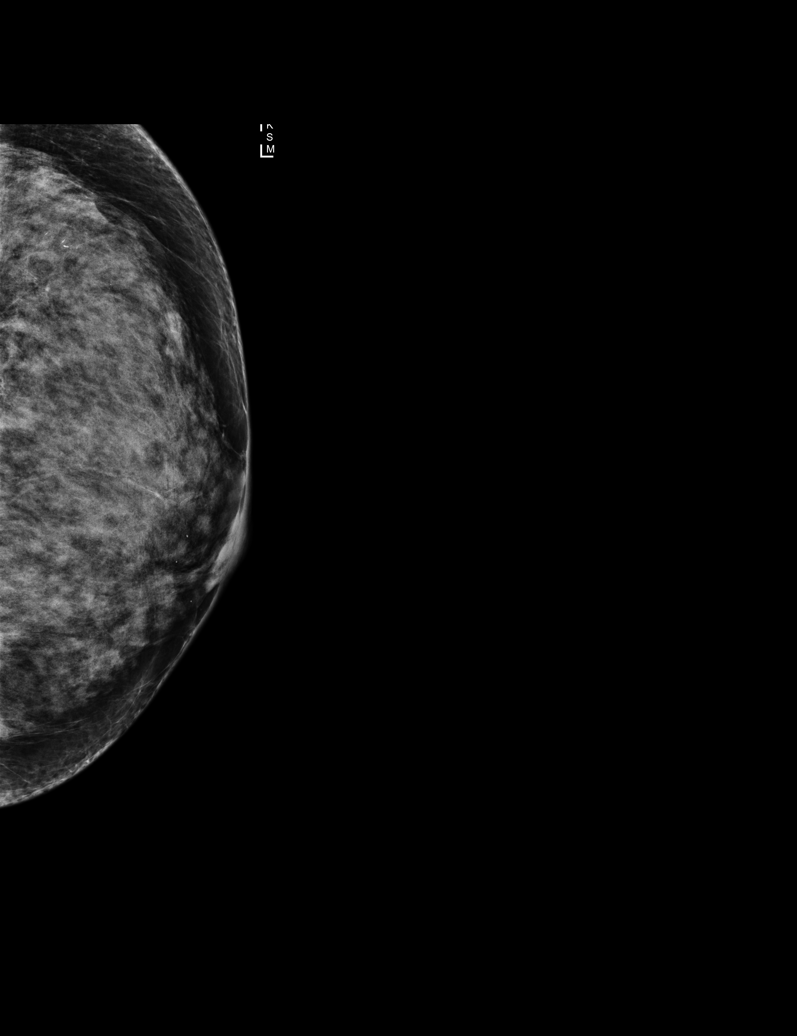

[L ML (3 of 3)]
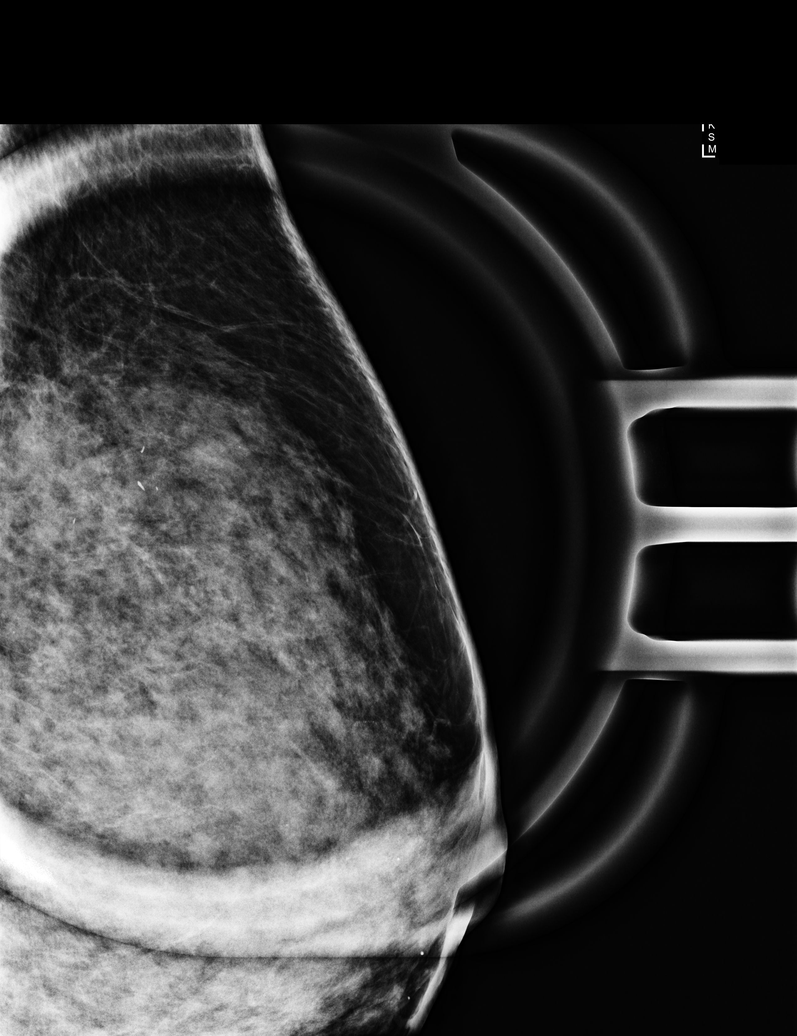

[6 of 6 positions shown; findings below may reference images not displayed]

ACR Breast Density Category d: The breast tissue is extremely dense,
which lowers the sensitivity of mammography.
FINDINGS: 3D tomographic and 2D generated true lateral and spot compression
magnification views of the left breast were obtained. These
demonstrate a 2.3 x 0.9 x 0.7 cm group of a small number of
calcifications in the upper-outer quadrant of the breast,
posteriorly. The majority of these are linear in configuration.

Mammographic images were processed with CAD.
IMPRESSION: 2.3 cm group of indeterminate calcifications in the upper-outer
quadrant of the left breast.

RECOMMENDATION:
3D stereotactic guided core needle biopsy of the 2.3 cm group of
calcifications in the upper-outer quadrant of the left breast. This
has been discussed with the patient and scheduled [DATE] a.m. on
[DATE].

I have discussed the findings and recommendations with the patient.
If applicable, a reminder letter will be sent to the patient
regarding the next appointment.

BI-RADS CATEGORY  4: Suspicious.

ADDENDUM:
The clinical data section should read as follows: Calcifications in
the upper-outer quadrant of the left breast posteriorly on a recent
screening mammogram.

*** End of Addendum ***
ACR Breast Density Category d: The breast tissue is extremely dense,
which lowers the sensitivity of mammography.
FINDINGS: 3D tomographic and 2D generated true lateral and spot compression
magnification views of the left breast were obtained. These
demonstrate a 2.3 x 0.9 x 0.7 cm group of a small number of
calcifications in the upper-outer quadrant of the breast,
posteriorly. The majority of these are linear in configuration.

Mammographic images were processed with CAD.
IMPRESSION: 2.3 cm group of indeterminate calcifications in the upper-outer
quadrant of the left breast.

RECOMMENDATION:
3D stereotactic guided core needle biopsy of the 2.3 cm group of
calcifications in the upper-outer quadrant of the left breast. This
has been discussed with the patient and scheduled [DATE] a.m. on
[DATE].

I have discussed the findings and recommendations with the patient.
If applicable, a reminder letter will be sent to the patient
regarding the next appointment.

BI-RADS CATEGORY  4: Suspicious.

## 2019-01-02 DIAGNOSIS — J3081 Allergic rhinitis due to animal (cat) (dog) hair and dander: Secondary | ICD-10-CM | POA: Diagnosis not present

## 2019-01-02 DIAGNOSIS — J301 Allergic rhinitis due to pollen: Secondary | ICD-10-CM | POA: Diagnosis not present

## 2019-01-02 DIAGNOSIS — J3089 Other allergic rhinitis: Secondary | ICD-10-CM | POA: Diagnosis not present

## 2019-01-03 ENCOUNTER — Other Ambulatory Visit: Payer: Self-pay | Admitting: Family Medicine

## 2019-01-03 ENCOUNTER — Ambulatory Visit
Admission: RE | Admit: 2019-01-03 | Discharge: 2019-01-03 | Disposition: A | Discharge status: Home / Self Care | Payer: Medicare Other | Source: Ambulatory Visit | Attending: Family Medicine | Admitting: Family Medicine

## 2019-01-03 ENCOUNTER — Other Ambulatory Visit: Payer: Self-pay

## 2019-01-03 DIAGNOSIS — R921 Mammographic calcification found on diagnostic imaging of breast: Secondary | ICD-10-CM

## 2019-01-03 DIAGNOSIS — N6012 Diffuse cystic mastopathy of left breast: Secondary | ICD-10-CM | POA: Diagnosis not present

## 2019-01-03 DIAGNOSIS — R928 Other abnormal and inconclusive findings on diagnostic imaging of breast: Secondary | ICD-10-CM

## 2019-01-03 IMAGING — MG MM BREAST LOCALIZATION CLIP
4 series · 4 of 12 positions shown · non-contrast
Comparison: Previous exam(s).

CLINICAL DATA: Left breast calcifications.

EXAM:
DIAGNOSTIC LEFT MAMMOGRAM POST STEREOTACTIC BIOPSY

[L LM synth-2D]
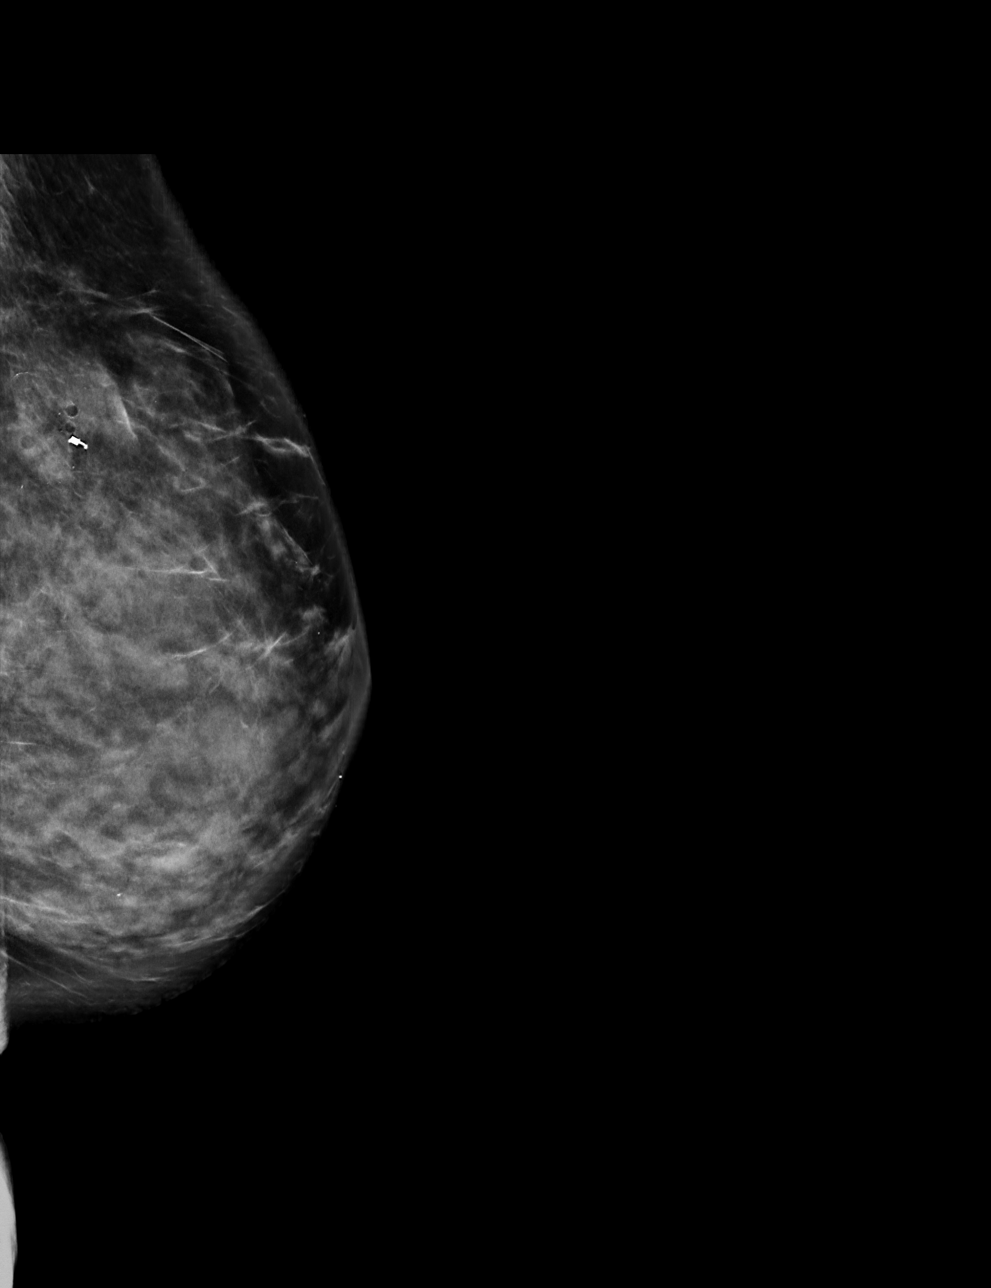

[L CC synth-2D]
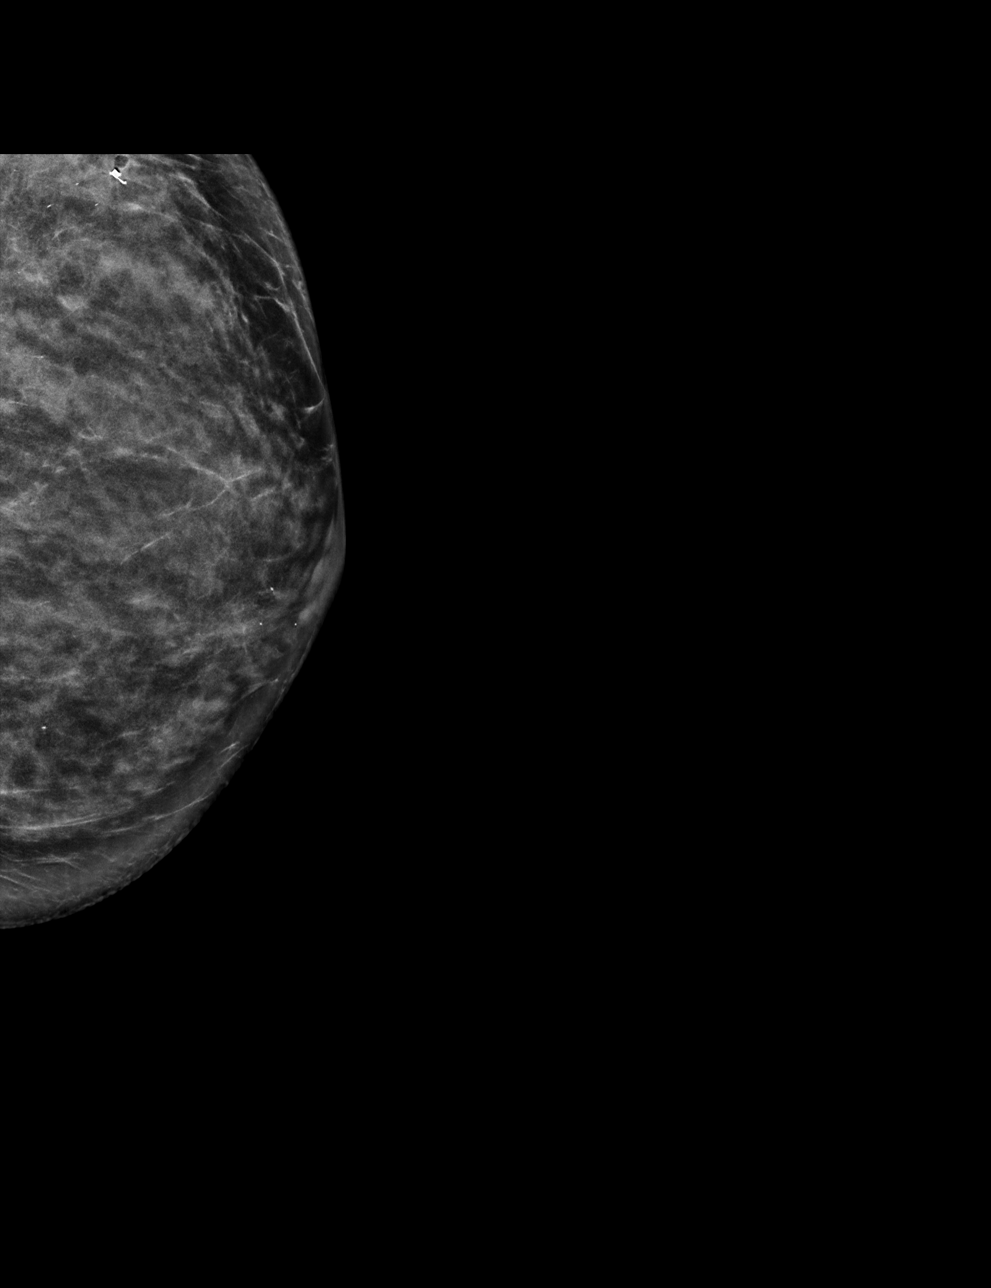

[L CC tomo · tomo slice 29/57.0]
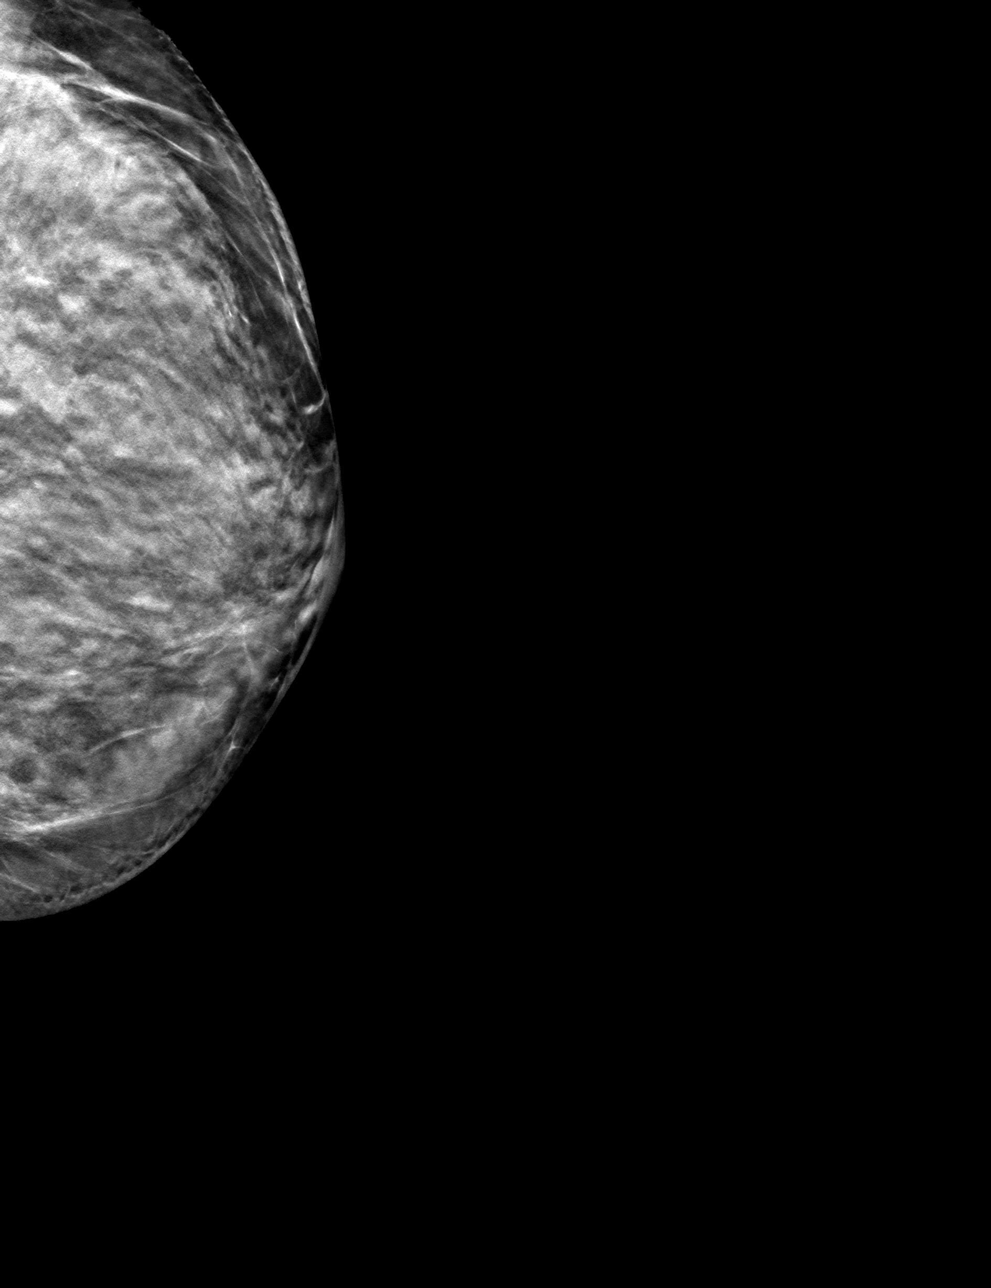

[L LM tomo · tomo slice 39/76.0]
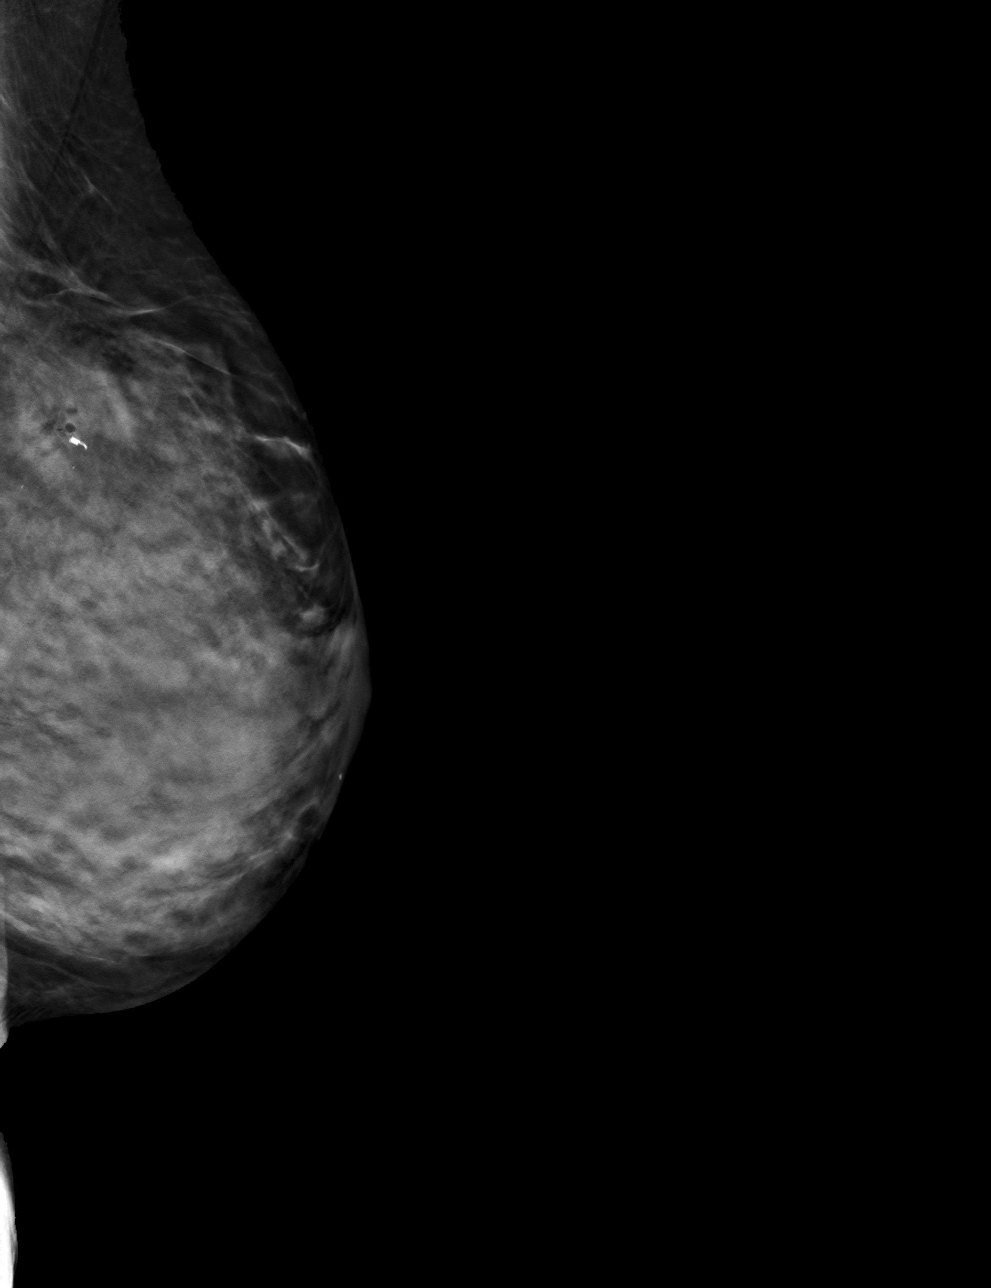

[4 of 12 positions shown; findings below may reference images not displayed]

FINDINGS: Mammographic images were obtained following stereotactic guided
biopsy of calcifications in the upper-outer quadrant of the left
breast. The biopsy marking clip is in expected position at the site
of biopsy.
IMPRESSION: Appropriate positioning of the coil shaped biopsy marking clip at
the site of biopsy in the upper-outer quadrant of the left breast.

Final Assessment: Post Procedure Mammograms for Marker Placement

## 2019-01-03 IMAGING — MG MM BREAST BX W LOC DEV 1ST LESION IMAGE BX SPEC STEREO GUIDE*L*
8 of 15 series · 8 of 31 positions shown · non-contrast
Comparison: Previous exams.
COMPARISON: Previous exams.

Addendum:
CLINICAL DATA: Left breast calcifications.

EXAM:
RIGHT BREAST STEREOTACTIC CORE NEEDLE BIOPSY

[L (1 of 6)]
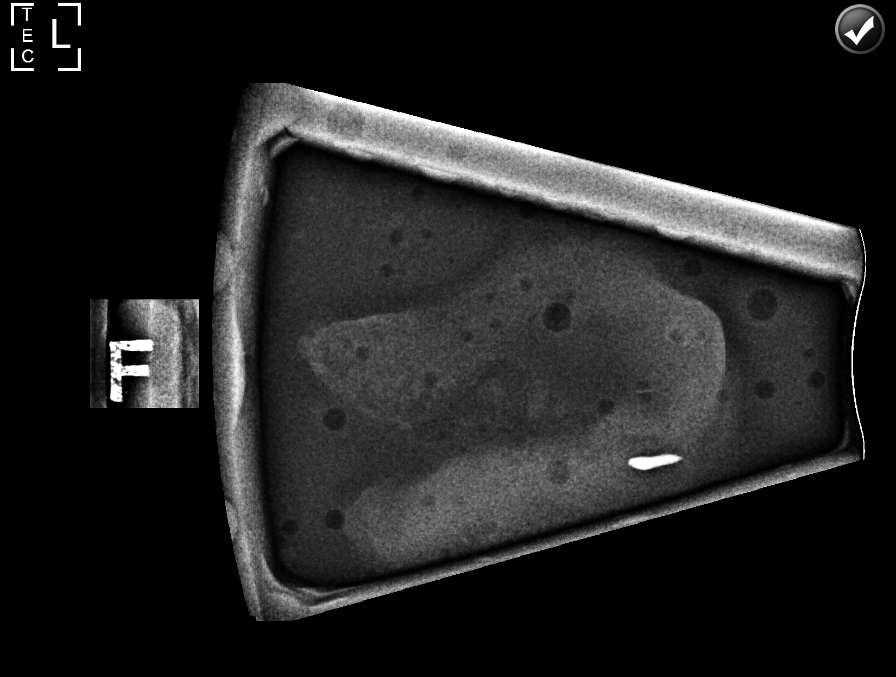

[L (2 of 6)]
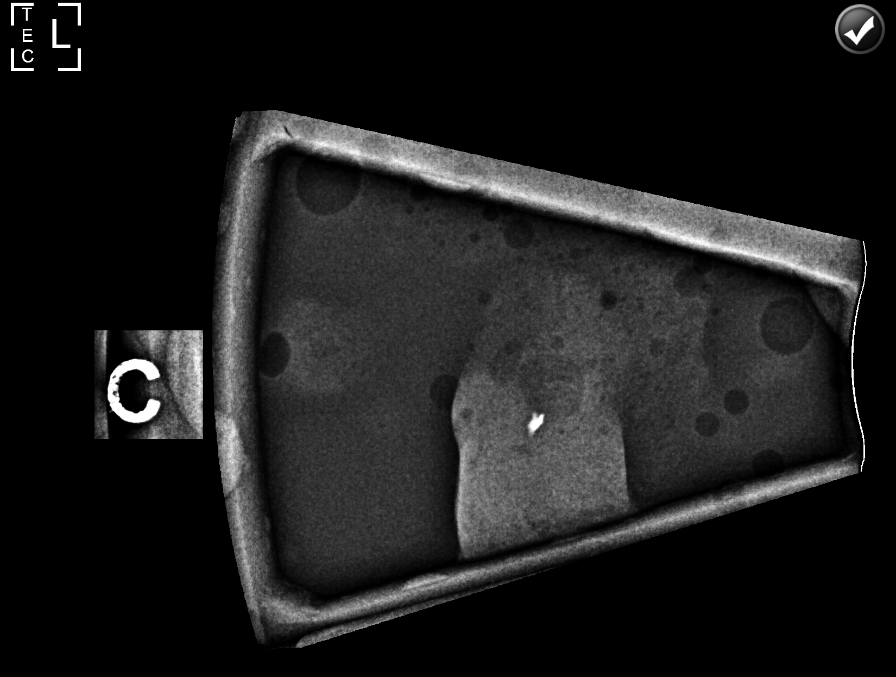

[L (3 of 6)]
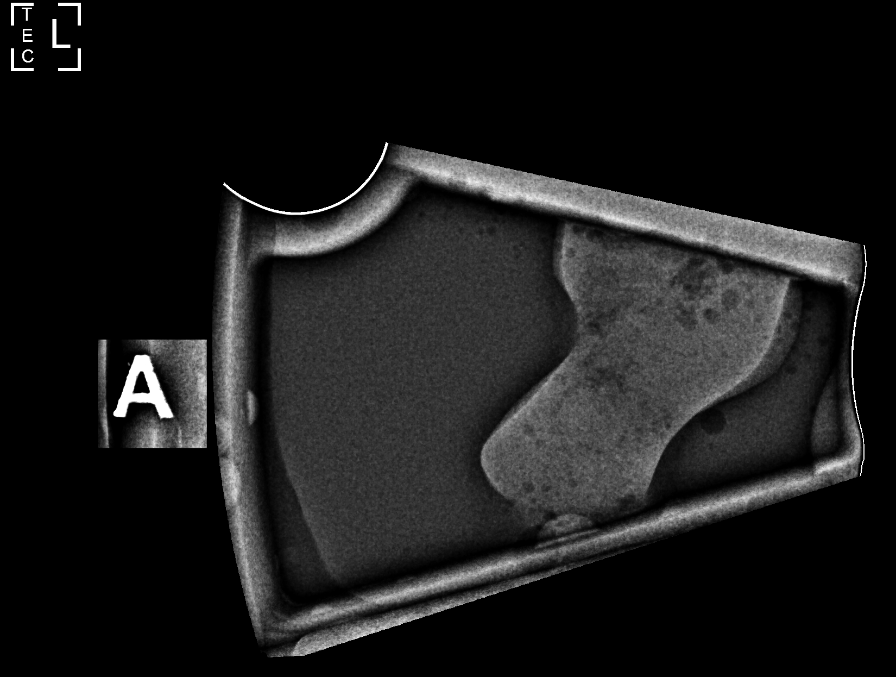

[L (4 of 6)]
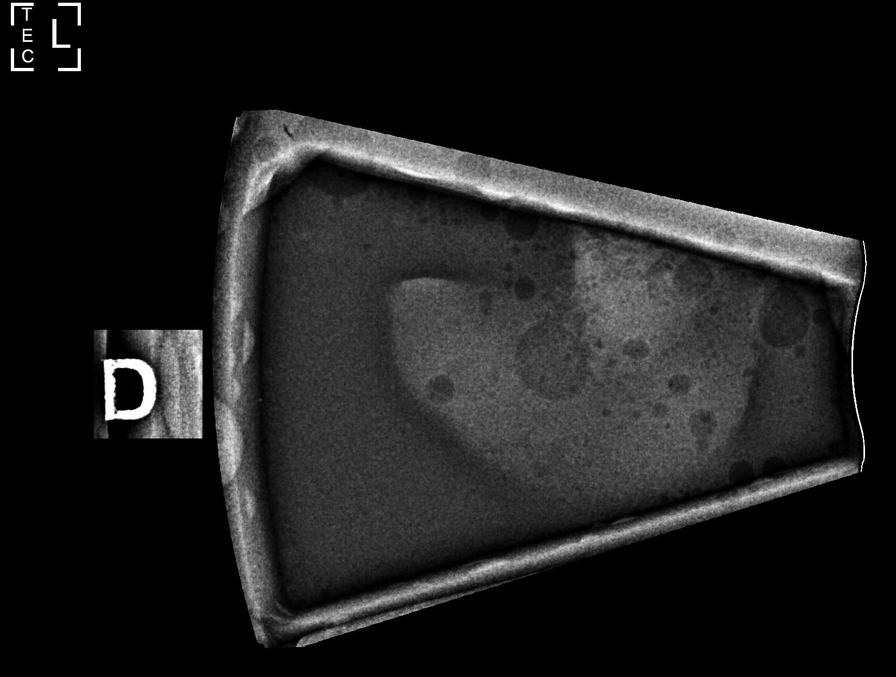

[L (5 of 6)]
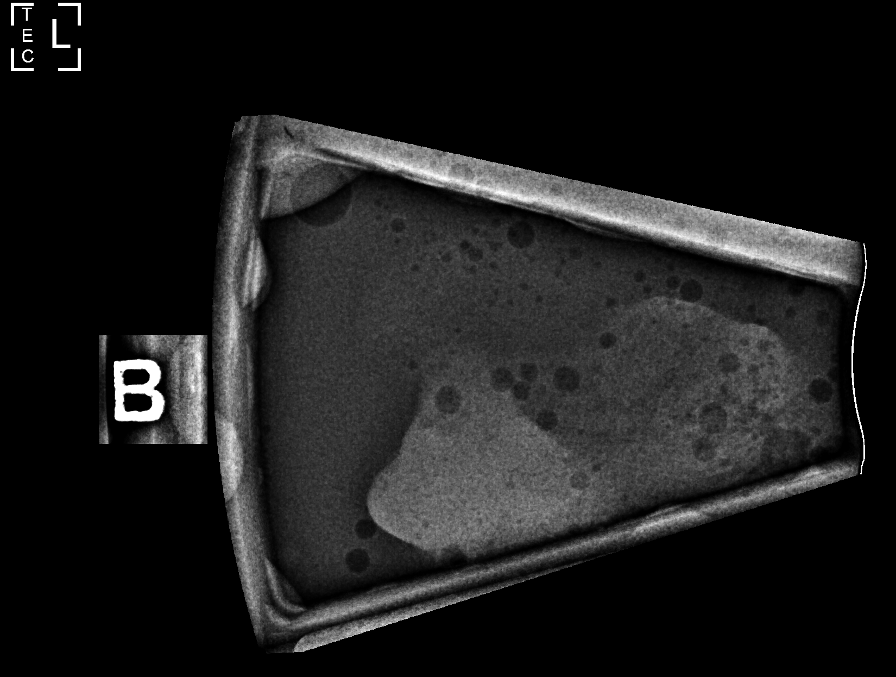

[L (6 of 6)]
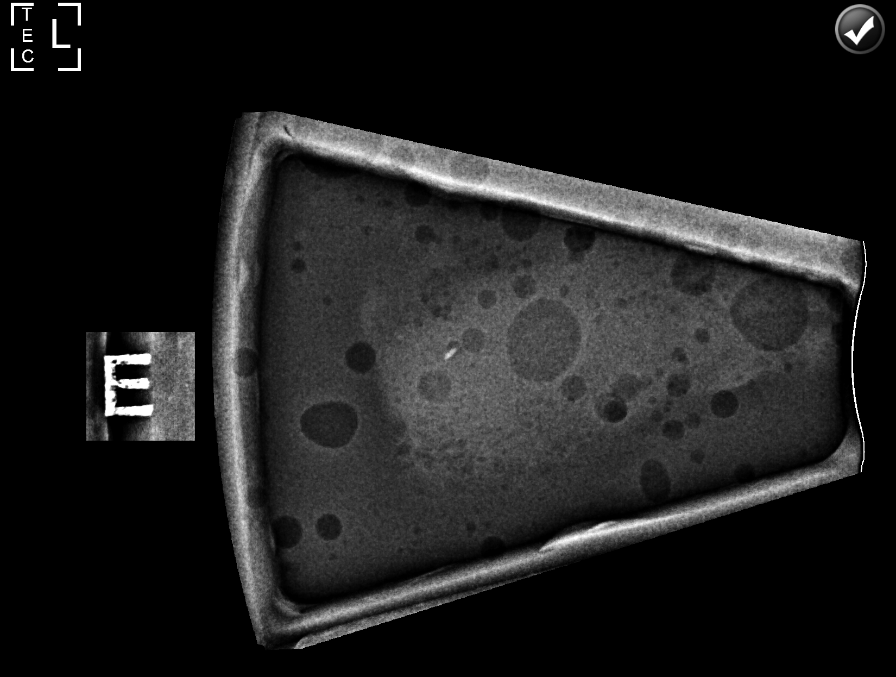

[L ML (1 of 2)]
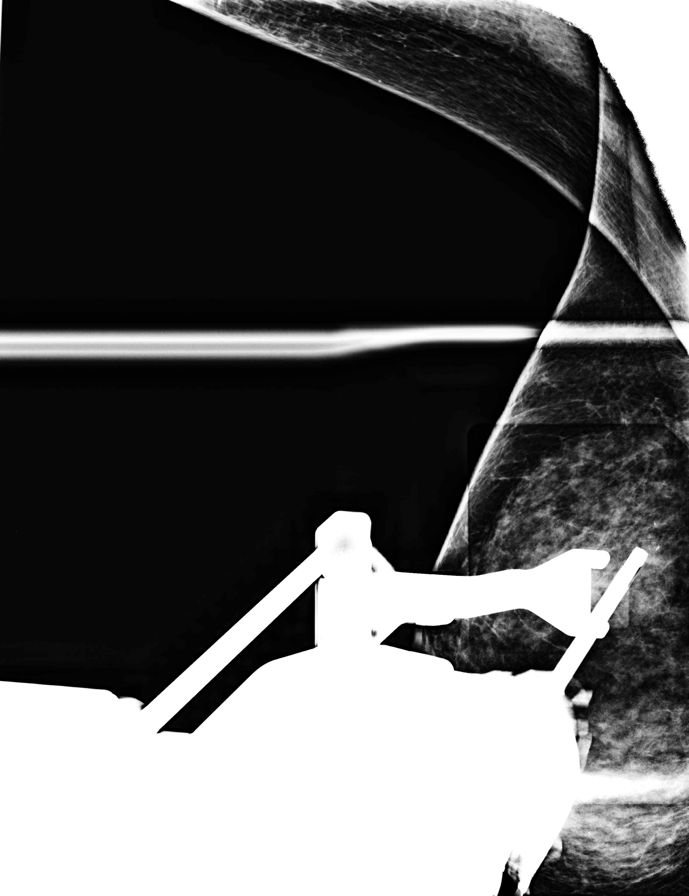

[L ML (2 of 2)]
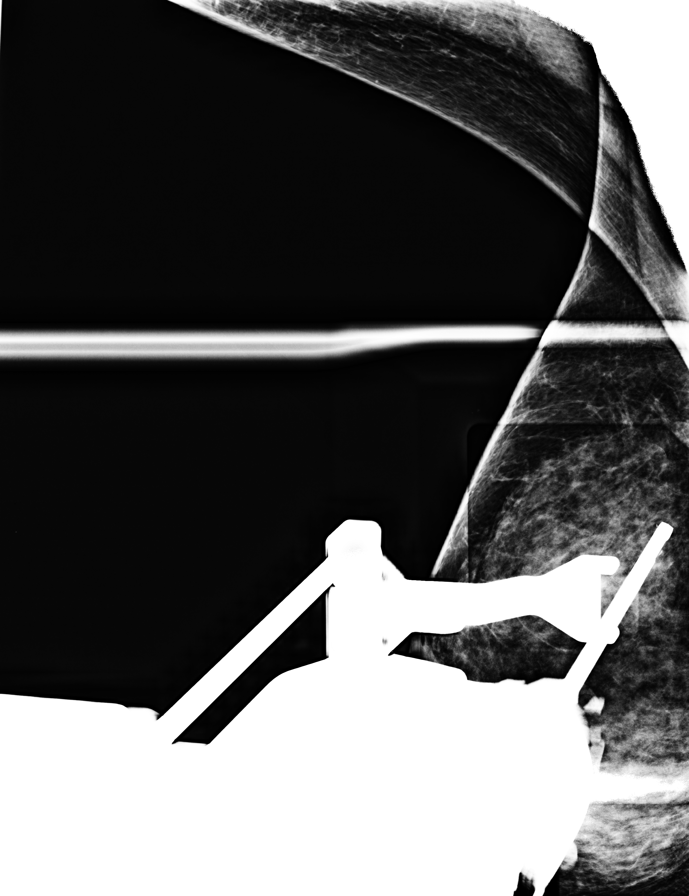

[8 of 31 positions shown; findings below may reference images not displayed]



Using sterile technique and 1% lidocaine and 1% lidocaine with
epinephrine as local anesthetic, under stereotactic guidance, a 9
gauge vacuum assisted device was used to perform core needle biopsy
of calcifications in the upper-outer quadrant of the left breast
using a lateral to medial approach. Specimen radiograph was
performed showing calcifications are present in the tissue samples.
Specimens with calcifications are identified for pathology.

Lesion quadrant: Upper-outer quadrant.

At the conclusion of the procedure, coil shaped tissue marker clip
was deployed into the biopsy cavity. Follow-up 2-view mammogram was
performed and dictated separately.
IMPRESSION: Stereotactic-guided biopsy of the left breast. No apparent
complications.

ADDENDUM:
Pathology revealed FIBROCYSTIC CHANGES WITH CALCIFICATIONS of the
Left breast, upper outer quadrant. This was found to be concordant
by Dr. GARIB.

Pathology results were discussed with the patient by telephone. The
patient reported doing well after the biopsy with tenderness at the
site. Post biopsy instructions and care were reviewed and questions
were answered. The patient was encouraged to call The [REDACTED]

The patient was instructed to return for annual screening
mammography at [HOSPITAL] [REDACTED] in [HOSPITAL][HOSPITAL].

Pathology results reported by GARIB, RN on [DATE].



Using sterile technique and 1% lidocaine and 1% lidocaine with
epinephrine as local anesthetic, under stereotactic guidance, a 9
gauge vacuum assisted device was used to perform core needle biopsy
of calcifications in the upper-outer quadrant of the left breast
using a lateral to medial approach. Specimen radiograph was
performed showing calcifications are present in the tissue samples.
Specimens with calcifications are identified for pathology.

Lesion quadrant: Upper-outer quadrant.

At the conclusion of the procedure, coil shaped tissue marker clip
was deployed into the biopsy cavity. Follow-up 2-view mammogram was
performed and dictated separately.
IMPRESSION: Stereotactic-guided biopsy of the left breast. No apparent
complications.

## 2019-01-03 MED FILL — FINASTERIDE 5 MG TABLET: 5 | 30 days supply | Qty: 15 | Fill #1

## 2019-01-11 MED FILL — GABAPENTIN 600 MG TABS: 600 | 30 days supply | Qty: 135 | Fill #4

## 2019-01-11 MED FILL — buPROPion HCL ER (XL) 150 M: 150 | 30 days supply | Qty: 90 | Fill #1

## 2019-01-16 ENCOUNTER — Ambulatory Visit (INDEPENDENT_AMBULATORY_CARE_PROVIDER_SITE_OTHER): Payer: Medicare Other | Admitting: Licensed Clinical Social Worker

## 2019-01-16 DIAGNOSIS — F3341 Major depressive disorder, recurrent, in partial remission: Secondary | ICD-10-CM | POA: Diagnosis not present

## 2019-01-18 ENCOUNTER — Other Ambulatory Visit: Payer: Self-pay | Admitting: Family Medicine

## 2019-01-18 MED FILL — CYCLOBENZAPRINE HCL 10 MG T: 10 | 30 days supply | Qty: 60 | Fill #0

## 2019-01-22 MED FILL — ALENDRONATE NA 70 MG TAB: 70 | 28 days supply | Qty: 4 | Fill #5

## 2019-01-22 MED FILL — LEVOTHYROXINE 50 MCG TABLET: 50 | 90 days supply | Qty: 90 | Fill #1

## 2019-01-23 DIAGNOSIS — J301 Allergic rhinitis due to pollen: Secondary | ICD-10-CM | POA: Diagnosis not present

## 2019-01-23 DIAGNOSIS — J3081 Allergic rhinitis due to animal (cat) (dog) hair and dander: Secondary | ICD-10-CM | POA: Diagnosis not present

## 2019-01-23 DIAGNOSIS — J3089 Other allergic rhinitis: Secondary | ICD-10-CM | POA: Diagnosis not present

## 2019-01-29 ENCOUNTER — Other Ambulatory Visit: Payer: Self-pay | Admitting: Family Medicine

## 2019-01-29 MED FILL — traMADol HCL 50 MG TABS: 50 | 10 days supply | Qty: 60 | Fill #0

## 2019-01-29 MED FILL — MELOXICAM 15 MG TABLET: 15 | 30 days supply | Qty: 30 | Fill #0

## 2019-01-30 MED FILL — FINASTERIDE 5 MG TABLET: 5 | 30 days supply | Qty: 15 | Fill #2

## 2019-02-06 DIAGNOSIS — J3081 Allergic rhinitis due to animal (cat) (dog) hair and dander: Secondary | ICD-10-CM | POA: Diagnosis not present

## 2019-02-06 DIAGNOSIS — J301 Allergic rhinitis due to pollen: Secondary | ICD-10-CM | POA: Diagnosis not present

## 2019-02-06 DIAGNOSIS — J3089 Other allergic rhinitis: Secondary | ICD-10-CM | POA: Diagnosis not present

## 2019-02-07 MED FILL — GABAPENTIN 600 MG TABS: 600 | 30 days supply | Qty: 135 | Fill #5

## 2019-02-07 MED FILL — buPROPion HCL ER (XL) 150 M: 150 | 30 days supply | Qty: 90 | Fill #2

## 2019-02-13 ENCOUNTER — Ambulatory Visit (INDEPENDENT_AMBULATORY_CARE_PROVIDER_SITE_OTHER): Payer: Medicare Other | Admitting: Licensed Clinical Social Worker

## 2019-02-13 DIAGNOSIS — F3341 Major depressive disorder, recurrent, in partial remission: Secondary | ICD-10-CM

## 2019-02-16 DIAGNOSIS — D1801 Hemangioma of skin and subcutaneous tissue: Secondary | ICD-10-CM | POA: Diagnosis not present

## 2019-02-16 DIAGNOSIS — L918 Other hypertrophic disorders of the skin: Secondary | ICD-10-CM | POA: Diagnosis not present

## 2019-02-16 DIAGNOSIS — L638 Other alopecia areata: Secondary | ICD-10-CM | POA: Diagnosis not present

## 2019-02-16 DIAGNOSIS — L821 Other seborrheic keratosis: Secondary | ICD-10-CM | POA: Diagnosis not present

## 2019-02-16 DIAGNOSIS — L570 Actinic keratosis: Secondary | ICD-10-CM | POA: Diagnosis not present

## 2019-02-21 DIAGNOSIS — J3089 Other allergic rhinitis: Secondary | ICD-10-CM | POA: Diagnosis not present

## 2019-02-21 DIAGNOSIS — J301 Allergic rhinitis due to pollen: Secondary | ICD-10-CM | POA: Diagnosis not present

## 2019-02-21 DIAGNOSIS — J3081 Allergic rhinitis due to animal (cat) (dog) hair and dander: Secondary | ICD-10-CM | POA: Diagnosis not present

## 2019-02-21 MED FILL — FINASTERIDE 5 MG TABLET: 5 | 30 days supply | Qty: 30 | Fill #0

## 2019-02-21 MED FILL — ALENDRONATE NA 70 MG TAB: 70 | 28 days supply | Qty: 4 | Fill #6

## 2019-02-27 DIAGNOSIS — J3081 Allergic rhinitis due to animal (cat) (dog) hair and dander: Secondary | ICD-10-CM | POA: Diagnosis not present

## 2019-02-27 DIAGNOSIS — R1310 Dysphagia, unspecified: Secondary | ICD-10-CM | POA: Diagnosis not present

## 2019-02-27 DIAGNOSIS — R490 Dysphonia: Secondary | ICD-10-CM | POA: Diagnosis not present

## 2019-02-27 DIAGNOSIS — J301 Allergic rhinitis due to pollen: Secondary | ICD-10-CM | POA: Diagnosis not present

## 2019-02-27 DIAGNOSIS — J3089 Other allergic rhinitis: Secondary | ICD-10-CM | POA: Diagnosis not present

## 2019-02-27 DIAGNOSIS — J4520 Mild intermittent asthma, uncomplicated: Secondary | ICD-10-CM | POA: Diagnosis not present

## 2019-02-27 DIAGNOSIS — H6980 Other specified disorders of Eustachian tube, unspecified ear: Secondary | ICD-10-CM | POA: Diagnosis not present

## 2019-02-27 MED FILL — MELOXICAM 15 MG TABLET: 15 | 30 days supply | Qty: 30 | Fill #1

## 2019-02-28 MED FILL — traMADol HCL 50 MG TABS: 50 | 10 days supply | Qty: 60 | Fill #1

## 2019-03-05 ENCOUNTER — Other Ambulatory Visit: Payer: Self-pay | Admitting: Family Medicine

## 2019-03-05 MED FILL — traZODone HCL 50 MG TABS: 50 | 23 days supply | Qty: 90 | Fill #0

## 2019-03-13 ENCOUNTER — Other Ambulatory Visit: Payer: Self-pay | Admitting: Family Medicine

## 2019-03-13 MED FILL — buPROPion HCL ER (XL) 150 M: 150 | 30 days supply | Qty: 90 | Fill #3

## 2019-03-13 MED FILL — GABAPENTIN 600 MG TABLET: 600 | 30 days supply | Qty: 135 | Fill #0

## 2019-03-19 ENCOUNTER — Other Ambulatory Visit: Payer: Self-pay | Admitting: Family Medicine

## 2019-03-19 MED FILL — CYCLOBENZAPRINE HCL 10 MG T: 10 | 30 days supply | Qty: 60 | Fill #0

## 2019-03-20 ENCOUNTER — Ambulatory Visit (INDEPENDENT_AMBULATORY_CARE_PROVIDER_SITE_OTHER): Payer: Medicare PPO | Admitting: Licensed Clinical Social Worker

## 2019-03-20 DIAGNOSIS — F3341 Major depressive disorder, recurrent, in partial remission: Secondary | ICD-10-CM

## 2019-03-21 MED FILL — FLUTICASONE PROP 50 MCG SPR: 50 | 30 days supply | Qty: 16 | Fill #0

## 2019-03-21 MED FILL — GABAPENTIN 600 MG TABLET: 600 | 30 days supply | Qty: 135 | Fill #0

## 2019-03-21 MED FILL — ALBUTEROL SULFATE HFA 108 (: 108 (90 BAS | 17 days supply | Qty: 9 | Fill #0

## 2019-03-21 MED FILL — ALENDRONATE NA 70 MG TAB: 70 | 28 days supply | Qty: 4 | Fill #7

## 2019-03-23 ENCOUNTER — Ambulatory Visit: Payer: Medicare Other | Admitting: Family Medicine

## 2019-03-27 ENCOUNTER — Ambulatory Visit (INDEPENDENT_AMBULATORY_CARE_PROVIDER_SITE_OTHER): Payer: Medicare PPO | Admitting: Family Medicine

## 2019-03-27 ENCOUNTER — Encounter: Payer: Self-pay | Admitting: Family Medicine

## 2019-03-27 ENCOUNTER — Other Ambulatory Visit: Payer: Self-pay

## 2019-03-27 VITALS — BP 138/47 | HR 96 | Ht 66.0 in | Wt 115.0 lb

## 2019-03-27 DIAGNOSIS — E039 Hypothyroidism, unspecified: Secondary | ICD-10-CM

## 2019-03-27 DIAGNOSIS — Z974 Presence of external hearing-aid: Secondary | ICD-10-CM | POA: Diagnosis not present

## 2019-03-27 DIAGNOSIS — K581 Irritable bowel syndrome with constipation: Secondary | ICD-10-CM | POA: Diagnosis not present

## 2019-03-27 DIAGNOSIS — R6889 Other general symptoms and signs: Secondary | ICD-10-CM | POA: Insufficient documentation

## 2019-03-27 DIAGNOSIS — Z1211 Encounter for screening for malignant neoplasm of colon: Secondary | ICD-10-CM | POA: Diagnosis not present

## 2019-03-27 DIAGNOSIS — M797 Fibromyalgia: Secondary | ICD-10-CM | POA: Diagnosis not present

## 2019-03-27 MED FILL — FINASTERIDE 5 MG TABLET: 5 | 30 days supply | Qty: 30 | Fill #1

## 2019-03-27 MED FILL — MELOXICAM 15 MG TABLET: 15 | 30 days supply | Qty: 30 | Fill #0

## 2019-03-27 NOTE — Assessment & Plan Note (Signed)
Encouraged her to call and schedule follow-up with Dr. Bryan Lemma.  I think her last appointment was delayed because of Covid.  It may be a good chance to catch up with him in regards to her IBS.

## 2019-03-27 NOTE — Assessment & Plan Note (Signed)
Due to recheck TSH levels.

## 2019-03-27 NOTE — Assessment & Plan Note (Signed)
She plans on getting repeat hearing testing soon as she does feel like there is been progression in the right ear.  She might need a better fitting hearing aid as well.
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
## 2019-03-27 NOTE — Assessment & Plan Note (Signed)
MMSE on March 27, 2019 score 30

## 2019-03-31 ENCOUNTER — Encounter: Payer: Self-pay | Admitting: Family Medicine

## 2019-03-31 LAB — COMPLETE METABOLIC PANEL WITH GFR
AG Ratio: 2.2 (calc) (ref 1.0–2.5)
ALT: 24 U/L (ref 6–29)
AST: 29 U/L (ref 10–35)
Albumin: 4.2 g/dL (ref 3.6–5.1)
Alkaline phosphatase (APISO): 86 U/L (ref 37–153)
BUN: 18 mg/dL (ref 7–25)
CO2: 31 mmol/L (ref 20–32)
Calcium: 9.6 mg/dL (ref 8.6–10.4)
Chloride: 105 mmol/L (ref 98–110)
Creat: 0.9 mg/dL (ref 0.50–0.99)
GFR, Est African American: 76 mL/min/{1.73_m2} (ref >=60)
GFR, Est Non African American: 65 mL/min/{1.73_m2} (ref >=60)
Globulin: 1.9 g/dL (calc) (ref 1.9–3.7)
Glucose, Bld: 114 mg/dL — ABNORMAL HIGH (ref 65–99)
Potassium: 4.4 mmol/L (ref 3.5–5.3)
Sodium: 142 mmol/L (ref 135–146)
Total Bilirubin: 0.5 mg/dL (ref 0.2–1.2)
Total Protein: 6.1 g/dL (ref 6.1–8.1)

## 2019-03-31 LAB — LIPID PANEL
Cholesterol: 132 mg/dL (ref <200)
HDL: 70 mg/dL (ref >=50)
LDL Cholesterol (Calc): 48 mg/dL (calc)
Non-HDL Cholesterol (Calc): 62 mg/dL (calc) (ref <130)
Total CHOL/HDL Ratio: 1.9 (calc) (ref <5.0)
Triglycerides: 67 mg/dL (ref <150)

## 2019-03-31 LAB — TSH: TSH: 1.95 mIU/L (ref 0.40–4.50)

## 2019-04-02 ENCOUNTER — Encounter: Payer: Self-pay | Admitting: Family Medicine

## 2019-04-02 MED FILL — traMADol HCL 50 MG TABS: 50 | 10 days supply | Qty: 60 | Fill #2

## 2019-04-02 NOTE — Progress Notes (Signed)
All labs are normal. 

## 2019-04-12 MED FILL — buPROPion HCL ER (XL) 150 M: 150 | 30 days supply | Qty: 90 | Fill #4

## 2019-04-13 ENCOUNTER — Encounter: Payer: Self-pay | Admitting: Family Medicine

## 2019-04-13 MED FILL — ALENDRONATE NA 70 MG TAB: 70 | 28 days supply | Qty: 4 | Fill #8

## 2019-04-16 NOTE — Telephone Encounter (Signed)
Robin Conrad,  When was the request sent in to Cologuard?

## 2019-04-17 ENCOUNTER — Ambulatory Visit (INDEPENDENT_AMBULATORY_CARE_PROVIDER_SITE_OTHER): Payer: Medicare PPO | Admitting: Licensed Clinical Social Worker

## 2019-04-17 DIAGNOSIS — F3341 Major depressive disorder, recurrent, in partial remission: Secondary | ICD-10-CM | POA: Diagnosis not present

## 2019-04-24 ENCOUNTER — Other Ambulatory Visit: Payer: Self-pay | Admitting: Family Medicine

## 2019-04-24 MED FILL — MELOXICAM 15 MG TABLET: 15 | 30 days supply | Qty: 30 | Fill #1

## 2019-04-24 MED FILL — LEVOTHYROXINE 50 MCG TABLET: 50 | 30 days supply | Qty: 30 | Fill #0

## 2019-04-24 MED FILL — GABAPENTIN 600 MG TABLET: 600 | 30 days supply | Qty: 135 | Fill #1

## 2019-04-26 MED FILL — FINASTERIDE 5 MG TABLET: 5 | 30 days supply | Qty: 30 | Fill #2

## 2019-05-01 LAB — COLOGUARD: Cologuard: NEGATIVE

## 2019-05-03 ENCOUNTER — Other Ambulatory Visit: Payer: Self-pay | Admitting: Family Medicine

## 2019-05-03 MED FILL — traMADol HCL 50 MG TABS: 50 | 10 days supply | Qty: 60 | Fill #0

## 2019-05-09 LAB — COLOGUARD: COLOGUARD: NEGATIVE

## 2019-05-11 ENCOUNTER — Telehealth: Payer: Self-pay | Admitting: Family Medicine

## 2019-05-11 NOTE — Telephone Encounter (Signed)
Called patient and LM on VM of normal results. Call back number provided if any questions. KG LPN

## 2019-05-11 NOTE — Telephone Encounter (Signed)
Call patient: Cologuard was negative.  Recommend repeat colon cancer screening in 3 years.

## 2019-05-14 MED FILL — buPROPion HCL ER (XL) 150 M: 150 | 30 days supply | Qty: 90 | Fill #5

## 2019-05-14 MED FILL — ALENDRONATE NA 70 MG TAB: 70 | 28 days supply | Qty: 4 | Fill #9

## 2019-05-15 ENCOUNTER — Ambulatory Visit (INDEPENDENT_AMBULATORY_CARE_PROVIDER_SITE_OTHER): Payer: Medicare PPO | Admitting: Family Medicine

## 2019-05-15 ENCOUNTER — Encounter: Payer: Self-pay | Admitting: Family Medicine

## 2019-05-15 ENCOUNTER — Ambulatory Visit: Payer: Medicare PPO | Admitting: Licensed Clinical Social Worker

## 2019-05-15 ENCOUNTER — Other Ambulatory Visit: Payer: Self-pay

## 2019-05-15 VITALS — BP 135/51 | HR 94 | Ht 66.0 in | Wt 115.0 lb

## 2019-05-15 DIAGNOSIS — H6123 Impacted cerumen, bilateral: Secondary | ICD-10-CM | POA: Diagnosis not present

## 2019-05-15 DIAGNOSIS — H9313 Tinnitus, bilateral: Secondary | ICD-10-CM

## 2019-05-15 DIAGNOSIS — H905 Unspecified sensorineural hearing loss: Secondary | ICD-10-CM
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
## 2019-05-16 ENCOUNTER — Ambulatory Visit (INDEPENDENT_AMBULATORY_CARE_PROVIDER_SITE_OTHER): Payer: Medicare PPO | Admitting: Licensed Clinical Social Worker

## 2019-05-16 DIAGNOSIS — F3341 Major depressive disorder, recurrent, in partial remission: Secondary | ICD-10-CM | POA: Diagnosis not present

## 2019-05-22 DIAGNOSIS — N3941 Urge incontinence: Secondary | ICD-10-CM | POA: Diagnosis not present

## 2019-05-22 DIAGNOSIS — R159 Full incontinence of feces: Secondary | ICD-10-CM | POA: Diagnosis not present
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
## 2019-05-23 DIAGNOSIS — H903 Sensorineural hearing loss, bilateral: Secondary | ICD-10-CM | POA: Diagnosis not present

## 2019-05-23 DIAGNOSIS — H6123 Impacted cerumen, bilateral: Secondary | ICD-10-CM | POA: Diagnosis not present

## 2019-05-29 ENCOUNTER — Ambulatory Visit (INDEPENDENT_AMBULATORY_CARE_PROVIDER_SITE_OTHER): Payer: Medicare PPO | Admitting: *Deleted

## 2019-05-29 VITALS — BP 129/55 | HR 70 | Ht 66.0 in | Wt 118.0 lb

## 2019-05-29 DIAGNOSIS — J3089 Other allergic rhinitis: Secondary | ICD-10-CM | POA: Diagnosis not present

## 2019-05-29 DIAGNOSIS — J3081 Allergic rhinitis due to animal (cat) (dog) hair and dander: Secondary | ICD-10-CM | POA: Diagnosis not present

## 2019-05-29 DIAGNOSIS — J301 Allergic rhinitis due to pollen: Secondary | ICD-10-CM | POA: Diagnosis not present

## 2019-05-29 DIAGNOSIS — Z0000 Encounter for general adult medical examination without abnormal findings: Secondary | ICD-10-CM

## 2019-05-29 NOTE — Patient Instructions (Addendum)
Please schedule your next medicare wellness visit with me in 1 yr.  Ms. Bruck , Thank you for taking time to come for your Medicare Wellness Visit. I appreciate your ongoing commitment to your health goals. Please review the following plan we discussed and let me know if I can assist you in the future.  Continue doing brain stimulating activities (puzzles, reading, adult coloring books, staying active) to keep memory sharp.   These are the goals we discussed: Goals    . Exercise 3x per week (30 min per time)     Try and get at least 3 days of walking in at 30 minutes at a time.    . Patient Stated     Patient states to build more strength and endurance       Health Maintenance After Age 8 After age 42, you are at a higher risk for certain long-term diseases and infections as well as injuries from falls. Falls are a major cause of broken bones and head injuries in people who are older than age 36. Getting regular preventive care can help to keep you healthy and well. Preventive care includes getting regular testing and making lifestyle changes as recommended by your health care provider. Talk with your health care provider about:  Which screenings and tests you should have. A screening is a test that checks for a disease when you have no symptoms.  A diet and exercise plan that is right for you. What should I know about screenings and tests to prevent falls? Screening and testing are the best ways to find a health problem early. Early diagnosis and treatment give you the best chance of managing medical conditions that are common after age 102. Certain conditions and lifestyle choices may make you more likely to have a fall. Your health care provider may recommend:  Regular vision checks. Poor vision and conditions such as cataracts can make you more likely to have a fall. If you wear glasses, make sure to get your prescription updated if your vision changes.  Medicine review. Work with your  health care provider to regularly review all of the medicines you are taking, including over-the-counter medicines. Ask your health care provider about any side effects that may make you more likely to have a fall. Tell your health care provider if any medicines that you take make you feel dizzy or sleepy.  Osteoporosis screening. Osteoporosis is a condition that causes the bones to get weaker. This can make the bones weak and cause them to break more easily.  Blood pressure screening. Blood pressure changes and medicines to control blood pressure can make you feel dizzy.  Strength and balance checks. Your health care provider may recommend certain tests to check your strength and balance while standing, walking, or changing positions.  Foot health exam. Foot pain and numbness, as well as not wearing proper footwear, can make you more likely to have a fall.  Depression screening. You may be more likely to have a fall if you have a fear of falling, feel emotionally low, or feel unable to do activities that you used to do.  Alcohol use screening. Using too much alcohol can affect your balance and may make you more likely to have a fall. What actions can I take to lower my risk of falls? General instructions  Talk with your health care provider about your risks for falling. Tell your health care provider if: ? You fall. Be sure to tell your health care  provider about all falls, even ones that seem minor. ? You feel dizzy, sleepy, or off-balance.  Take over-the-counter and prescription medicines only as told by your health care provider. These include any supplements.  Eat a healthy diet and maintain a healthy weight. A healthy diet includes low-fat dairy products, low-fat (lean) meats, and fiber from whole grains, beans, and lots of fruits and vegetables. Home safety  Remove any tripping hazards, such as rugs, cords, and clutter.  Install safety equipment such as grab bars in bathrooms and  safety rails on stairs.  Keep rooms and walkways well-lit. Activity   Follow a regular exercise program to stay fit. This will help you maintain your balance. Ask your health care provider what types of exercise are appropriate for you.  If you need a cane or walker, use it as recommended by your health care provider.  Wear supportive shoes that have nonskid soles. Lifestyle  Do not drink alcohol if your health care provider tells you not to drink.  If you drink alcohol, limit how much you have: ? 0-1 drink a day for women. ? 0-2 drinks a day for men.  Be aware of how much alcohol is in your drink. In the [place] one drink equals one typical bottle of beer (12 oz), one-half glass of wine (5 oz), or one shot of hard liquor (1 oz).  Do not use any products that contain nicotine or tobacco, such as cigarettes and e-cigarettes. If you need help quitting, ask your health care provider. Summary  Having a healthy lifestyle and getting preventive care can help to protect your health and wellness after age 9.  Screening and testing are the best way to find a health problem early and help you avoid having a fall. Early diagnosis and treatment give you the best chance for managing medical conditions that are more common for people who are older than age 33.  Falls are a major cause of broken bones and head injuries in people who are older than age 46. Take precautions to prevent a fall at home.  Work with your health care provider to learn what changes you can make to improve your health and wellness and to prevent falls. This information is not intended to replace advice given to you by your health care provider. Make sure you discuss any questions you have with your health care provider. Document Revised: 06/22/2018 Document Reviewed: 01/12/2017 Elsevier Patient Education  2020 [employer].

## 2019-05-30 MED FILL — MELOXICAM 15 MG TABLET: 15 | 30 days supply | Qty: 30 | Fill #0

## 2019-05-30 MED FILL — GABAPENTIN 600 MG TABLET: 600 | 30 days supply | Qty: 135 | Fill #2

## 2019-05-30 MED FILL — traMADol HCL 50 MG TABS: 50 | 10 days supply | Qty: 60 | Fill #1

## 2019-05-30 MED FILL — FINASTERIDE 5 MG TABLET: 5 | 30 days supply | Qty: 30 | Fill #3

## 2019-05-30 MED FILL — CYCLOBENZAPRINE HCL 10 MG T: 10 | 30 days supply | Qty: 60 | Fill #1

## 2019-05-30 MED FILL — LEVOTHYROXINE 50 MCG TABLET: 50 | 30 days supply | Qty: 30 | Fill #1

## 2019-06-05 DIAGNOSIS — N3941 Urge incontinence: Secondary | ICD-10-CM | POA: Diagnosis not present

## 2019-06-05 DIAGNOSIS — R159 Full incontinence of feces: Secondary | ICD-10-CM | POA: Diagnosis not present

## 2019-06-06 ENCOUNTER — Telehealth: Payer: Self-pay

## 2019-06-06 NOTE — Telephone Encounter (Signed)
Robin Conrad called and states she is having neck spasms. She states her husband had the same neck spasm and ended up getting shingles. She knows she has been vaccinated. She is going to take a muscle relaxer because it may just be her fibromyalgia. She wanted to let Dr Madilyn Fireman know just in case it turned out to be shingles. Denies rash.

## 2019-06-06 NOTE — Telephone Encounter (Signed)
Patient advised of recommendations.  

## 2019-06-06 NOTE — Telephone Encounter (Signed)
If she breaks out with a rash please let us know immediately so that we can get her on medication if needed.

## 2019-06-12 ENCOUNTER — Ambulatory Visit (INDEPENDENT_AMBULATORY_CARE_PROVIDER_SITE_OTHER): Payer: Medicare PPO | Admitting: Licensed Clinical Social Worker

## 2019-06-12 DIAGNOSIS — F3341 Major depressive disorder, recurrent, in partial remission: Secondary | ICD-10-CM | POA: Diagnosis not present

## 2019-06-13 ENCOUNTER — Other Ambulatory Visit: Payer: Self-pay | Admitting: Family Medicine

## 2019-06-13 MED FILL — ALENDRONATE NA 70 MG TAB: 70 | 28 days supply | Qty: 4 | Fill #10

## 2019-06-13 MED FILL — buPROPion HCL ER (XL) 150 M: 150 | 30 days supply | Qty: 90 | Fill #0

## 2019-06-19 DIAGNOSIS — J3081 Allergic rhinitis due to animal (cat) (dog) hair and dander: Secondary | ICD-10-CM | POA: Diagnosis not present

## 2019-06-19 DIAGNOSIS — J3089 Other allergic rhinitis: Secondary | ICD-10-CM | POA: Diagnosis not present

## 2019-06-19 DIAGNOSIS — J301 Allergic rhinitis due to pollen: Secondary | ICD-10-CM | POA: Diagnosis not present

## 2019-06-22 DIAGNOSIS — M797 Fibromyalgia: Secondary | ICD-10-CM | POA: Diagnosis not present

## 2019-06-22 DIAGNOSIS — Z79891 Long term (current) use of opiate analgesic: Secondary | ICD-10-CM | POA: Diagnosis not present

## 2019-06-22 DIAGNOSIS — Z791 Long term (current) use of non-steroidal anti-inflammatories (NSAID): Secondary | ICD-10-CM | POA: Diagnosis not present

## 2019-06-22 DIAGNOSIS — M351 Other overlap syndromes: Secondary | ICD-10-CM | POA: Diagnosis not present

## 2019-06-27 ENCOUNTER — Ambulatory Visit (INDEPENDENT_AMBULATORY_CARE_PROVIDER_SITE_OTHER): Payer: Medicare PPO | Admitting: Psychology

## 2019-06-27 DIAGNOSIS — F411 Generalized anxiety disorder: Secondary | ICD-10-CM | POA: Diagnosis not present

## 2019-06-28 MED FILL — GABAPENTIN 600 MG TABLET: 600 | 30 days supply | Qty: 135 | Fill #3

## 2019-06-28 MED FILL — LEVOTHYROXINE 50 MCG TABLET: 50 | 30 days supply | Qty: 30 | Fill #2

## 2019-06-28 MED FILL — FINASTERIDE 5 MG TABLET: 5 | 30 days supply | Qty: 30 | Fill #4

## 2019-07-02 DIAGNOSIS — J3089 Other allergic rhinitis: Secondary | ICD-10-CM | POA: Diagnosis not present

## 2019-07-02 DIAGNOSIS — J3081 Allergic rhinitis due to animal (cat) (dog) hair and dander: Secondary | ICD-10-CM | POA: Diagnosis not present

## 2019-07-02 DIAGNOSIS — J301 Allergic rhinitis due to pollen: Secondary | ICD-10-CM | POA: Diagnosis not present

## 2019-07-03 ENCOUNTER — Other Ambulatory Visit: Payer: Self-pay

## 2019-07-03 ENCOUNTER — Ambulatory Visit (INDEPENDENT_AMBULATORY_CARE_PROVIDER_SITE_OTHER): Payer: Medicare PPO | Admitting: Gastroenterology

## 2019-07-03 ENCOUNTER — Encounter: Payer: Self-pay | Admitting: Gastroenterology

## 2019-07-03 VITALS — BP 130/76 | HR 89 | Temp 97.5°F | Ht 66.0 in | Wt 117.2 lb

## 2019-07-03 DIAGNOSIS — R194 Change in bowel habit: Secondary | ICD-10-CM | POA: Diagnosis not present

## 2019-07-03 DIAGNOSIS — K6289 Other specified diseases of anus and rectum: Secondary | ICD-10-CM

## 2019-07-03 DIAGNOSIS — K219 Gastro-esophageal reflux disease without esophagitis: Secondary | ICD-10-CM

## 2019-07-03 DIAGNOSIS — R197 Diarrhea, unspecified: Secondary | ICD-10-CM | POA: Diagnosis not present

## 2019-07-03 DIAGNOSIS — K589 Irritable bowel syndrome without diarrhea: Secondary | ICD-10-CM

## 2019-07-03 DIAGNOSIS — R151 Fecal smearing: Secondary | ICD-10-CM

## 2019-07-03 MED FILL — MELOXICAM 15 MG TABLET: 15 | 30 days supply | Qty: 30 | Fill #1

## 2019-07-03 MED FILL — traMADol HCL 50 MG TABS: 50 | 10 days supply | Qty: 60 | Fill #2

## 2019-07-03 NOTE — Patient Instructions (Signed)
If you are age 70 or older, your body mass index should be between 23-30. Your Body mass index is 18.92 kg/m. If this is out of the aforementioned range listed, please consider follow up with your Primary Care Provider.  If you are age 50 or younger, your body mass index should be between 19-25. Your Body mass index is 18.92 kg/m. If this is out of the aformentioned range listed, please consider follow up with your Primary Care Provider.   You have been referred to Dr. Dema Severin, you will be contacted with this appointment.   Please go to the lab at [facility] ([place] You will need to go to level "B", you do not need an appointment for this. Hours available are 7:30 am - 4:30 pm.   It was a pleasure to see you today!  Vito Cirigliano, D.O.
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
## 2019-07-06 DIAGNOSIS — M797 Fibromyalgia: Secondary | ICD-10-CM | POA: Diagnosis not present

## 2019-07-06 DIAGNOSIS — Z79891 Long term (current) use of opiate analgesic: Secondary | ICD-10-CM | POA: Diagnosis not present

## 2019-07-06 DIAGNOSIS — M9511 Cauliflower ear, right ear: Secondary | ICD-10-CM | POA: Diagnosis not present

## 2019-07-06 DIAGNOSIS — Z791 Long term (current) use of non-steroidal anti-inflammatories (NSAID): Secondary | ICD-10-CM | POA: Diagnosis not present

## 2019-07-09 DIAGNOSIS — R159 Full incontinence of feces: Secondary | ICD-10-CM | POA: Diagnosis not present

## 2019-07-11 ENCOUNTER — Other Ambulatory Visit: Payer: Medicare PPO

## 2019-07-11 DIAGNOSIS — K589 Irritable bowel syndrome without diarrhea: Secondary | ICD-10-CM | POA: Diagnosis not present

## 2019-07-11 DIAGNOSIS — R197 Diarrhea, unspecified: Secondary | ICD-10-CM

## 2019-07-12 MED FILL — buPROPion HCL ER (XL) 150 M: 150 | 30 days supply | Qty: 90 | Fill #1

## 2019-07-12 MED FILL — CYCLOBENZAPRINE HCL 10 MG T: 10 | 30 days supply | Qty: 60 | Fill #2

## 2019-07-14 LAB — GI PROFILE, STOOL, PCR

## 2019-07-16 MED FILL — ALENDRONATE NA 70 MG TAB: 70 | 28 days supply | Qty: 4 | Fill #11

## 2019-07-18 DIAGNOSIS — J301 Allergic rhinitis due to pollen: Secondary | ICD-10-CM | POA: Diagnosis not present

## 2019-07-18 DIAGNOSIS — J3081 Allergic rhinitis due to animal (cat) (dog) hair and dander: Secondary | ICD-10-CM | POA: Diagnosis not present

## 2019-07-18 DIAGNOSIS — J3089 Other allergic rhinitis: Secondary | ICD-10-CM | POA: Diagnosis not present

## 2019-07-18 LAB — CALPROTECTIN: Calprotectin: 104 mcg/g

## 2019-07-31 ENCOUNTER — Telehealth: Payer: Self-pay

## 2019-07-31 ENCOUNTER — Ambulatory Visit: Payer: Medicare PPO | Admitting: Family Medicine

## 2019-07-31 ENCOUNTER — Other Ambulatory Visit: Payer: Self-pay | Admitting: Family Medicine

## 2019-07-31 DIAGNOSIS — R159 Full incontinence of feces: Secondary | ICD-10-CM | POA: Diagnosis not present

## 2019-07-31 DIAGNOSIS — M545 Low back pain: Secondary | ICD-10-CM | POA: Diagnosis not present

## 2019-07-31 DIAGNOSIS — M6281 Muscle weakness (generalized): Secondary | ICD-10-CM | POA: Diagnosis not present

## 2019-07-31 DIAGNOSIS — N3946 Mixed incontinence: Secondary | ICD-10-CM | POA: Diagnosis not present

## 2019-07-31 DIAGNOSIS — E039 Hypothyroidism, unspecified: Secondary | ICD-10-CM

## 2019-07-31 MED FILL — MELOXICAM 15 MG TABLET: 15 | 30 days supply | Qty: 30 | Fill #0

## 2019-07-31 MED FILL — LEVOTHYROXINE SODIUM 50 MCG: 50 | 30 days supply | Qty: 30 | Fill #3

## 2019-07-31 MED FILL — FINASTERIDE 5 MG TABLET: 5 | 30 days supply | Qty: 30 | Fill #5

## 2019-07-31 MED FILL — GABAPENTIN 600 MG TABLET: 600 | 30 days supply | Qty: 135 | Fill #4

## 2019-07-31 NOTE — Telephone Encounter (Signed)
Last RX written 05/03/19 fpr 30 day supply with 2 RF   Upcoming visit for 4 month f/u on 08/03/19

## 2019-07-31 NOTE — Telephone Encounter (Signed)
Agree with below. Addendum:    I believe that she has limits with moving and including, toileting, bathing, feeding, dressing and grooming. I believe the power wheelchair is needed for pt to be able to perform ADL's in her home

## 2019-07-31 NOTE — Telephone Encounter (Addendum)
The pharmacy called and states there was a change in manufacturer of the levothyroxine. I called patient and advised to follow up with a TSH in 6-8 weeks after starting the new manufacturer, per office protocol. Order placed.

## 2019-08-01 DIAGNOSIS — J3089 Other allergic rhinitis: Secondary | ICD-10-CM | POA: Diagnosis not present

## 2019-08-01 DIAGNOSIS — J301 Allergic rhinitis due to pollen: Secondary | ICD-10-CM | POA: Diagnosis not present

## 2019-08-01 DIAGNOSIS — J3081 Allergic rhinitis due to animal (cat) (dog) hair and dander: Secondary | ICD-10-CM | POA: Diagnosis not present

## 2019-08-01 MED FILL — traMADol HCL 50 MG TABS: 50 | 10 days supply | Qty: 60 | Fill #0

## 2019-08-02 ENCOUNTER — Other Ambulatory Visit (HOSPITAL_BASED_OUTPATIENT_CLINIC_OR_DEPARTMENT_OTHER): Payer: Self-pay | Admitting: Obstetrics & Gynecology

## 2019-08-02 ENCOUNTER — Ambulatory Visit (INDEPENDENT_AMBULATORY_CARE_PROVIDER_SITE_OTHER): Payer: Medicare PPO | Admitting: Psychology

## 2019-08-02 DIAGNOSIS — F411 Generalized anxiety disorder: Secondary | ICD-10-CM | POA: Diagnosis not present

## 2019-08-02 MED FILL — ESTRADIOL 0.1 MG/GM CRM: 0.1 | 90 days supply | Qty: 43 | Fill #0

## 2019-08-02 MED FILL — ALBUTEROL SULFATE HFA 108 (: 108 (90 BAS | 17 days supply | Qty: 9 | Fill #1

## 2019-08-03 ENCOUNTER — Other Ambulatory Visit: Payer: Self-pay

## 2019-08-03 ENCOUNTER — Telehealth (INDEPENDENT_AMBULATORY_CARE_PROVIDER_SITE_OTHER): Payer: Medicare PPO | Admitting: Family Medicine

## 2019-08-03 VITALS — Ht 66.0 in | Wt 117.0 lb

## 2019-08-03 DIAGNOSIS — M797 Fibromyalgia: Secondary | ICD-10-CM

## 2019-08-03 DIAGNOSIS — E039 Hypothyroidism, unspecified: Secondary | ICD-10-CM

## 2019-08-03 DIAGNOSIS — F439 Reaction to severe stress, unspecified: Secondary | ICD-10-CM | POA: Diagnosis not present

## 2019-08-03 MED FILL — traZODone HCL 50 MG TABS: 50 | 23 days supply | Qty: 90 | Fill #1

## 2019-08-03 MED FILL — FLUTICASONE PROP 50 MCG SPR: 50 | 30 days supply | Qty: 16 | Fill #1

## 2019-08-03 NOTE — Progress Notes (Signed)
Having thyroid rechecked in 6 weeks  Saw a gastroenterology about fecal incontinence to evaluate stim device - referred for pelvic floor physical therapy at [facility]  No refills needed  Having increased anxiety over health the last three days. PHQ9-GAD7 completed.

## 2019-08-03 NOTE — Progress Notes (Signed)
Virtual Visit via Video Note  I connected with Robin Conrad on 08/08/19 at  3:40 PM EDT by a video enabled telemedicine application and verified that I am speaking with the correct person using two identifiers.   I discussed the limitations of evaluation and management by telemedicine and the availability of in person appointments. The patient expressed understanding and agreed to proceed.  Subjective:    CC: Stress  HPI:  She has had a really stressful week.  Feeling overwhelmed and little time for herself as she is taking  cre of her grandkids during the week for her daughter.  Mostly taking care of hte 55 yo.  She just feels like she does not have a lot of time for herself as she is spent years helping to raise her grandchildren.  Does not always feel that her daughter really understands.  She has been seeing her therapist and that has been helpful. She jsut started seeing Myra Gianotti, therapist.   Having thyroid rechecked in 6 weeks  Saw a gastroenterology about fecal incontinence to evaluate stim device - referred for pelvic floor physical therapy at [facility]  No refills needed  Having increased anxiety over health the last three days. PHQ9-GAD7 completed.   Follow-up fibromyalgia-no recent flares but we did not get into a more detailed conversation about her fibro-.  She still taking all of her medications regularly.   Past medical history, Surgical history, Family history not pertinant except as noted below, Social history, Allergies, and medications have been entered into the medical record, reviewed, and corrections made.   Review of Systems: No fevers, chills, night sweats, weight loss, chest pain, or shortness of breath.   Objective:    General: Speaking clearly in complete sentences without any shortness of breath.  Alert and oriented x3.  Normal judgment. No apparent acute distress.    Impression and Recommendations:    Stress Glad she is  starting to work with Myra Gianotti for therapy I think this will be really helpful in really setting some goals and maybe even setting some boundaries with taking care of her grandchildren for her daughter.  I think she does need to be able to find time for herself to really invest in her personal physical and mental health.  I think regular exercise would be helpful as well to reduce stress levels.  Fibromyalgia Stable.  No changes made to her current regimen.  Hypothyroid Plan to recheck TSH in about 6 weeks.     Time spent in encounter 25 minutes  I discussed the assessment and treatment plan with the patient. The patient was provided an opportunity to ask questions and all were answered. The patient agreed with the plan and demonstrated an understanding of the instructions.   The patient was advised to call back or seek an in-person evaluation if the symptoms worsen or if the condition fails to improve as anticipated.   Beatrice Lecher, MD

## 2019-08-08 ENCOUNTER — Encounter: Payer: Self-pay | Admitting: Family Medicine

## 2019-08-08 NOTE — Assessment & Plan Note (Signed)
Plan to recheck TSH in about 6 weeks.

## 2019-08-08 NOTE — Assessment & Plan Note (Signed)
Stable.  No changes made to her current regimen.

## 2019-08-08 NOTE — Assessment & Plan Note (Signed)
Glad she is starting to work with Myra Gianotti for therapy I think this will be really helpful in really setting some goals and maybe even setting some boundaries with taking care of her grandchildren for her daughter.  I think she does need to be able to find time for herself to really invest in her personal physical and mental health.  I think regular exercise would be helpful as well to reduce stress levels.

## 2019-08-14 ENCOUNTER — Other Ambulatory Visit: Payer: Self-pay | Admitting: Family Medicine

## 2019-08-14 MED FILL — ALENDRONATE NA 70 MG TAB: 70 | 28 days supply | Qty: 4 | Fill #0

## 2019-08-14 MED FILL — buPROPion HCL ER (XL) 150 M: 150 | 30 days supply | Qty: 90 | Fill #0

## 2019-08-15 DIAGNOSIS — J3081 Allergic rhinitis due to animal (cat) (dog) hair and dander: Secondary | ICD-10-CM | POA: Diagnosis not present

## 2019-08-15 DIAGNOSIS — J301 Allergic rhinitis due to pollen: Secondary | ICD-10-CM | POA: Diagnosis not present

## 2019-08-15 DIAGNOSIS — J3089 Other allergic rhinitis: Secondary | ICD-10-CM | POA: Diagnosis not present

## 2019-08-21 DIAGNOSIS — N941 Unspecified dyspareunia: Secondary | ICD-10-CM | POA: Diagnosis not present

## 2019-08-21 DIAGNOSIS — M545 Low back pain: Secondary | ICD-10-CM | POA: Diagnosis not present

## 2019-08-21 DIAGNOSIS — N3946 Mixed incontinence: Secondary | ICD-10-CM | POA: Diagnosis not present

## 2019-08-21 DIAGNOSIS — M6281 Muscle weakness (generalized): Secondary | ICD-10-CM | POA: Diagnosis not present

## 2019-08-28 DIAGNOSIS — M6281 Muscle weakness (generalized): Secondary | ICD-10-CM | POA: Diagnosis not present

## 2019-08-28 DIAGNOSIS — N3946 Mixed incontinence: Secondary | ICD-10-CM | POA: Diagnosis not present

## 2019-08-28 DIAGNOSIS — N941 Unspecified dyspareunia: Secondary | ICD-10-CM | POA: Diagnosis not present

## 2019-08-28 DIAGNOSIS — R159 Full incontinence of feces: Secondary | ICD-10-CM | POA: Diagnosis not present

## 2019-08-29 DIAGNOSIS — J3081 Allergic rhinitis due to animal (cat) (dog) hair and dander: Secondary | ICD-10-CM | POA: Diagnosis not present

## 2019-08-29 DIAGNOSIS — J3089 Other allergic rhinitis: Secondary | ICD-10-CM | POA: Diagnosis not present

## 2019-08-29 DIAGNOSIS — J301 Allergic rhinitis due to pollen: Secondary | ICD-10-CM | POA: Diagnosis not present

## 2019-08-29 MED FILL — LEVOTHYROXINE SODIUM 50 MCG: 50 | 30 days supply | Qty: 30 | Fill #4

## 2019-08-29 MED FILL — MELOXICAM 15 MG TABLET: 15 | 30 days supply | Qty: 30 | Fill #1

## 2019-08-30 ENCOUNTER — Ambulatory Visit (INDEPENDENT_AMBULATORY_CARE_PROVIDER_SITE_OTHER): Payer: Medicare PPO | Admitting: Psychology

## 2019-08-30 DIAGNOSIS — F411 Generalized anxiety disorder: Secondary | ICD-10-CM

## 2019-09-04 DIAGNOSIS — N3946 Mixed incontinence: Secondary | ICD-10-CM | POA: Diagnosis not present

## 2019-09-04 DIAGNOSIS — R159 Full incontinence of feces: Secondary | ICD-10-CM | POA: Diagnosis not present

## 2019-09-04 DIAGNOSIS — M6281 Muscle weakness (generalized): Secondary | ICD-10-CM | POA: Diagnosis not present

## 2019-09-04 DIAGNOSIS — M545 Low back pain: Secondary | ICD-10-CM | POA: Diagnosis not present

## 2019-09-05 MED FILL — traMADol HCL 50 MG TABS: 50 | 10 days supply | Qty: 60 | Fill #1

## 2019-09-07 ENCOUNTER — Other Ambulatory Visit (HOSPITAL_BASED_OUTPATIENT_CLINIC_OR_DEPARTMENT_OTHER): Payer: Self-pay | Admitting: Family Medicine

## 2019-09-07 DIAGNOSIS — L638 Other alopecia areata: Secondary | ICD-10-CM | POA: Diagnosis not present

## 2019-09-11 DIAGNOSIS — R159 Full incontinence of feces: Secondary | ICD-10-CM | POA: Diagnosis not present

## 2019-09-11 DIAGNOSIS — M545 Low back pain: Secondary | ICD-10-CM | POA: Diagnosis not present

## 2019-09-11 DIAGNOSIS — M6281 Muscle weakness (generalized): Secondary | ICD-10-CM | POA: Diagnosis not present

## 2019-09-11 DIAGNOSIS — N3946 Mixed incontinence: Secondary | ICD-10-CM | POA: Diagnosis not present

## 2019-09-11 MED FILL — GABAPENTIN 600 MG TABLET: 600 | 30 days supply | Qty: 135 | Fill #5

## 2019-09-11 MED FILL — buPROPion HCL ER (XL) 150 M: 150 | 30 days supply | Qty: 90 | Fill #1

## 2019-09-11 MED FILL — FINASTERIDE 5 MG TABLET: 5 | 30 days supply | Qty: 30 | Fill #0

## 2019-09-12 DIAGNOSIS — E039 Hypothyroidism, unspecified: Secondary | ICD-10-CM | POA: Diagnosis not present

## 2019-09-12 MED FILL — ALENDRONATE NA 70 MG TAB: 70 | 28 days supply | Qty: 4 | Fill #1

## 2019-09-13 LAB — TSH: TSH: 1.37 mIU/L (ref 0.40–4.50)

## 2019-09-14 DIAGNOSIS — H2513 Age-related nuclear cataract, bilateral: Secondary | ICD-10-CM | POA: Diagnosis not present

## 2019-09-14 DIAGNOSIS — D3131 Benign neoplasm of right choroid: Secondary | ICD-10-CM | POA: Diagnosis not present

## 2019-09-14 DIAGNOSIS — H5203 Hypermetropia, bilateral: Secondary | ICD-10-CM | POA: Diagnosis not present

## 2019-09-19 DIAGNOSIS — J3089 Other allergic rhinitis: Secondary | ICD-10-CM | POA: Diagnosis not present

## 2019-09-19 DIAGNOSIS — J3081 Allergic rhinitis due to animal (cat) (dog) hair and dander: Secondary | ICD-10-CM | POA: Diagnosis not present

## 2019-09-19 DIAGNOSIS — J301 Allergic rhinitis due to pollen: Secondary | ICD-10-CM | POA: Diagnosis not present

## 2019-09-21 DIAGNOSIS — M351 Other overlap syndromes: Secondary | ICD-10-CM | POA: Diagnosis not present

## 2019-09-21 DIAGNOSIS — Z791 Long term (current) use of non-steroidal anti-inflammatories (NSAID): Secondary | ICD-10-CM | POA: Diagnosis not present

## 2019-09-27 ENCOUNTER — Ambulatory Visit (INDEPENDENT_AMBULATORY_CARE_PROVIDER_SITE_OTHER): Payer: Medicare PPO | Admitting: Psychology

## 2019-09-27 DIAGNOSIS — F411 Generalized anxiety disorder: Secondary | ICD-10-CM | POA: Diagnosis not present

## 2019-10-02 DIAGNOSIS — J301 Allergic rhinitis due to pollen: Secondary | ICD-10-CM | POA: Diagnosis not present

## 2019-10-02 DIAGNOSIS — J3081 Allergic rhinitis due to animal (cat) (dog) hair and dander: Secondary | ICD-10-CM | POA: Diagnosis not present

## 2019-10-02 DIAGNOSIS — J3089 Other allergic rhinitis: Secondary | ICD-10-CM | POA: Diagnosis not present

## 2019-10-02 MED FILL — traMADol HCL 50 MG TABS: 50 | 10 days supply | Qty: 60 | Fill #2

## 2019-10-02 MED FILL — CYCLOBENZAPRINE HCL 10 MG T: 10 | 30 days supply | Qty: 60 | Fill #3

## 2019-10-02 MED FILL — LEVOTHYROXINE SODIUM 50 MCG: 50 | 30 days supply | Qty: 30 | Fill #5

## 2019-10-03 DIAGNOSIS — N3946 Mixed incontinence: Secondary | ICD-10-CM | POA: Diagnosis not present

## 2019-10-03 DIAGNOSIS — R159 Full incontinence of feces: Secondary | ICD-10-CM | POA: Diagnosis not present

## 2019-10-03 DIAGNOSIS — M6281 Muscle weakness (generalized): Secondary | ICD-10-CM | POA: Diagnosis not present

## 2019-10-03 DIAGNOSIS — M545 Low back pain: Secondary | ICD-10-CM | POA: Diagnosis not present

## 2019-10-03 MED FILL — MELOXICAM 15 MG TABLET: 15 | 30 days supply | Qty: 30 | Fill #0

## 2019-10-10 MED FILL — ALENDRONATE NA 70 MG TAB: 70 | 28 days supply | Qty: 4 | Fill #2

## 2019-10-10 MED FILL — FINASTERIDE 5 MG TABLET: 5 | 30 days supply | Qty: 30 | Fill #1

## 2019-10-11 ENCOUNTER — Ambulatory Visit (INDEPENDENT_AMBULATORY_CARE_PROVIDER_SITE_OTHER): Payer: Medicare PPO | Admitting: Family Medicine

## 2019-10-11 ENCOUNTER — Other Ambulatory Visit: Payer: Self-pay

## 2019-10-11 ENCOUNTER — Encounter: Payer: Self-pay | Admitting: Family Medicine

## 2019-10-11 VITALS — BP 120/72 | HR 89 | Ht 66.0 in | Wt 119.0 lb

## 2019-10-11 DIAGNOSIS — S8002XA Contusion of left knee, initial encounter: Secondary | ICD-10-CM | POA: Diagnosis not present

## 2019-10-11 DIAGNOSIS — Z79899 Other long term (current) drug therapy: Secondary | ICD-10-CM

## 2019-10-11 DIAGNOSIS — Z9181 History of falling: Secondary | ICD-10-CM

## 2019-10-11 DIAGNOSIS — S50312A Abrasion of left elbow, initial encounter: Secondary | ICD-10-CM | POA: Diagnosis not present

## 2019-10-11 MED ORDER — METHOCARBAMOL 500 MG PO TABS: 500.0000 mg | ORAL_TABLET | Freq: Two times a day (BID) | ORAL | 1 refills | Status: DC | PRN

## 2019-10-11 MED FILL — METHOCARBAMOL 500 MG TABS: 500 | 15 days supply | Qty: 30 | Fill #0
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
## 2019-10-15 ENCOUNTER — Other Ambulatory Visit: Payer: Self-pay | Admitting: Family Medicine

## 2019-10-16 ENCOUNTER — Other Ambulatory Visit: Payer: Self-pay | Admitting: Family Medicine

## 2019-10-16 DIAGNOSIS — J3081 Allergic rhinitis due to animal (cat) (dog) hair and dander: Secondary | ICD-10-CM | POA: Diagnosis not present

## 2019-10-16 DIAGNOSIS — N3946 Mixed incontinence: Secondary | ICD-10-CM | POA: Diagnosis not present

## 2019-10-16 DIAGNOSIS — R159 Full incontinence of feces: Secondary | ICD-10-CM | POA: Diagnosis not present

## 2019-10-16 DIAGNOSIS — N9419 Other specified dyspareunia: Secondary | ICD-10-CM | POA: Diagnosis not present

## 2019-10-16 DIAGNOSIS — M6281 Muscle weakness (generalized): Secondary | ICD-10-CM | POA: Diagnosis not present

## 2019-10-16 DIAGNOSIS — J301 Allergic rhinitis due to pollen: Secondary | ICD-10-CM | POA: Diagnosis not present

## 2019-10-16 DIAGNOSIS — J3089 Other allergic rhinitis: Secondary | ICD-10-CM | POA: Diagnosis not present

## 2019-10-16 MED FILL — GABAPENTIN 600 MG TABLET: 600 | 30 days supply | Qty: 135 | Fill #0

## 2019-10-17 ENCOUNTER — Other Ambulatory Visit: Payer: Self-pay | Admitting: Family Medicine

## 2019-10-17 MED FILL — buPROPion HCL ER (XL) 150 M: 150 | 30 days supply | Qty: 90 | Fill #0

## 2019-10-23 DIAGNOSIS — N9419 Other specified dyspareunia: Secondary | ICD-10-CM | POA: Diagnosis not present

## 2019-10-23 DIAGNOSIS — N3946 Mixed incontinence: Secondary | ICD-10-CM | POA: Diagnosis not present

## 2019-10-23 DIAGNOSIS — M6281 Muscle weakness (generalized): Secondary | ICD-10-CM | POA: Diagnosis not present

## 2019-10-23 DIAGNOSIS — R159 Full incontinence of feces: Secondary | ICD-10-CM | POA: Diagnosis not present

## 2019-10-25 ENCOUNTER — Ambulatory Visit (INDEPENDENT_AMBULATORY_CARE_PROVIDER_SITE_OTHER): Payer: Medicare PPO | Admitting: Psychology

## 2019-10-25 DIAGNOSIS — F411 Generalized anxiety disorder: Secondary | ICD-10-CM

## 2019-10-31 ENCOUNTER — Other Ambulatory Visit: Payer: Self-pay | Admitting: Family Medicine

## 2019-10-31 MED FILL — MELOXICAM 15 MG TABLET: 15 | 30 days supply | Qty: 30 | Fill #1

## 2019-10-31 MED FILL — traMADol HCL 50 MG TABS: 50 | 10 days supply | Qty: 60 | Fill #0

## 2019-10-31 MED FILL — LEVOTHYROXINE SODIUM 50 MCG: 50 | 90 days supply | Qty: 90 | Fill #0

## 2019-11-01 DIAGNOSIS — J3089 Other allergic rhinitis: Secondary | ICD-10-CM | POA: Diagnosis not present

## 2019-11-01 DIAGNOSIS — J301 Allergic rhinitis due to pollen: Secondary | ICD-10-CM | POA: Diagnosis not present

## 2019-11-01 DIAGNOSIS — J3081 Allergic rhinitis due to animal (cat) (dog) hair and dander: Secondary | ICD-10-CM | POA: Diagnosis not present

## 2019-11-05 MED FILL — ALENDRONATE NA 70 MG TAB: 70 | 28 days supply | Qty: 4 | Fill #3

## 2019-11-07 MED FILL — FINASTERIDE 5 MG TABLET: 5 | 30 days supply | Qty: 30 | Fill #2

## 2019-11-07 MED FILL — METHOCARBAMOL 500 MG TABS: 500 | 15 days supply | Qty: 30 | Fill #1

## 2019-11-13 DIAGNOSIS — J301 Allergic rhinitis due to pollen: Secondary | ICD-10-CM | POA: Diagnosis not present

## 2019-11-13 DIAGNOSIS — J3089 Other allergic rhinitis: Secondary | ICD-10-CM | POA: Diagnosis not present

## 2019-11-13 DIAGNOSIS — J3081 Allergic rhinitis due to animal (cat) (dog) hair and dander: Secondary | ICD-10-CM | POA: Diagnosis not present

## 2019-11-15 MED FILL — GABAPENTIN 600 MG TABLET: 600 | 30 days supply | Qty: 135 | Fill #1

## 2019-11-15 MED FILL — buPROPion HCL ER (XL) 150 M: 150 | 30 days supply | Qty: 90 | Fill #1

## 2019-11-21 ENCOUNTER — Ambulatory Visit (INDEPENDENT_AMBULATORY_CARE_PROVIDER_SITE_OTHER): Payer: Medicare PPO | Admitting: Psychology

## 2019-11-21 DIAGNOSIS — F411 Generalized anxiety disorder: Secondary | ICD-10-CM | POA: Diagnosis not present

## 2019-11-23 DIAGNOSIS — J301 Allergic rhinitis due to pollen: Secondary | ICD-10-CM | POA: Diagnosis not present

## 2019-11-23 DIAGNOSIS — J3081 Allergic rhinitis due to animal (cat) (dog) hair and dander: Secondary | ICD-10-CM | POA: Diagnosis not present

## 2019-11-23 DIAGNOSIS — J3089 Other allergic rhinitis: Secondary | ICD-10-CM | POA: Diagnosis not present

## 2019-11-27 DIAGNOSIS — J3081 Allergic rhinitis due to animal (cat) (dog) hair and dander: Secondary | ICD-10-CM | POA: Diagnosis not present

## 2019-11-27 DIAGNOSIS — J3089 Other allergic rhinitis: Secondary | ICD-10-CM | POA: Diagnosis not present

## 2019-11-27 DIAGNOSIS — J301 Allergic rhinitis due to pollen: Secondary | ICD-10-CM | POA: Diagnosis not present

## 2019-12-03 MED FILL — ALENDRONATE NA 70 MG TAB: 70 | 28 days supply | Qty: 4 | Fill #4

## 2019-12-03 MED FILL — MELOXICAM 15 MG TABLET: 15 | 30 days supply | Qty: 30 | Fill #0

## 2019-12-05 ENCOUNTER — Other Ambulatory Visit: Payer: Self-pay | Admitting: Family Medicine

## 2019-12-05 MED FILL — traMADol HCL 50 MG TABS: 50 | 10 days supply | Qty: 60 | Fill #1

## 2019-12-05 MED FILL — METHOCARBAMOL 500 MG TABS: 500 | 15 days supply | Qty: 30 | Fill #0

## 2019-12-06 DIAGNOSIS — J3081 Allergic rhinitis due to animal (cat) (dog) hair and dander: Secondary | ICD-10-CM | POA: Diagnosis not present

## 2019-12-06 DIAGNOSIS — R490 Dysphonia: Secondary | ICD-10-CM | POA: Diagnosis not present

## 2019-12-06 DIAGNOSIS — J4520 Mild intermittent asthma, uncomplicated: Secondary | ICD-10-CM | POA: Diagnosis not present

## 2019-12-06 DIAGNOSIS — H6980 Other specified disorders of Eustachian tube, unspecified ear: Secondary | ICD-10-CM | POA: Diagnosis not present

## 2019-12-06 DIAGNOSIS — J301 Allergic rhinitis due to pollen: Secondary | ICD-10-CM | POA: Diagnosis not present

## 2019-12-06 DIAGNOSIS — J3089 Other allergic rhinitis: Secondary | ICD-10-CM | POA: Diagnosis not present

## 2019-12-06 MED FILL — IPRATROPIUM 0.06% SPRAY: 0.06 | 30 days supply | Qty: 30 | Fill #0

## 2019-12-07 ENCOUNTER — Ambulatory Visit (INDEPENDENT_AMBULATORY_CARE_PROVIDER_SITE_OTHER): Payer: Medicare PPO | Admitting: Family Medicine

## 2019-12-07 ENCOUNTER — Encounter: Payer: Self-pay | Admitting: Family Medicine

## 2019-12-07 VITALS — BP 122/47 | HR 80 | Ht 66.0 in | Wt 115.0 lb

## 2019-12-07 DIAGNOSIS — E039 Hypothyroidism, unspecified: Secondary | ICD-10-CM | POA: Diagnosis not present

## 2019-12-07 DIAGNOSIS — Z79899 Other long term (current) drug therapy: Secondary | ICD-10-CM | POA: Diagnosis not present

## 2019-12-07 DIAGNOSIS — R634 Abnormal weight loss: Secondary | ICD-10-CM | POA: Diagnosis not present

## 2019-12-07 DIAGNOSIS — M797 Fibromyalgia: Secondary | ICD-10-CM | POA: Diagnosis not present

## 2019-12-07 DIAGNOSIS — M359 Systemic involvement of connective tissue, unspecified: Secondary | ICD-10-CM

## 2019-12-07 DIAGNOSIS — Z23 Encounter for immunization: Secondary | ICD-10-CM | POA: Diagnosis not present

## 2019-12-07 DIAGNOSIS — F330 Major depressive disorder, recurrent, mild: Secondary | ICD-10-CM | POA: Diagnosis not present

## 2019-12-07 NOTE — Assessment & Plan Note (Signed)
She is lost 4 pounds again we discussed some strategies and making sure that she is getting some supplements she says she has some boost in her refrigerator but sometimes just forgets to drink 1.  I do want a make sure we keep an eye on this and she does not continue to lose weight.
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
## 2019-12-07 NOTE — Assessment & Plan Note (Signed)
Continue Wellbutrin for now.  No changes today.

## 2019-12-07 NOTE — Assessment & Plan Note (Signed)
Asymptomatic.  Last TSH looked great in June it was 1.3.  We will plan to recheck again when we do a full panel in January.

## 2019-12-07 NOTE — Assessment & Plan Note (Signed)
Stable thus far.  Though she has had some skin sensitivity increase and a little bit of disruption of sleep quality since coming off the Flexeril and switching to the methocarbamol.  She is okay with continuing the methocarbamol for now we will continue to work on sleep strategies such as setting a timer for her reading and trying to go to bed if at all possible.

## 2019-12-12 ENCOUNTER — Encounter: Payer: Self-pay | Admitting: Family Medicine

## 2019-12-13 ENCOUNTER — Other Ambulatory Visit: Payer: Self-pay | Admitting: Family Medicine

## 2019-12-13 MED FILL — buPROPion HCL ER (XL) 150 M: 150 | 30 days supply | Qty: 90 | Fill #0

## 2019-12-13 MED FILL — FINASTERIDE 5 MG TABLET: 5 | 30 days supply | Qty: 30 | Fill #3

## 2019-12-20 ENCOUNTER — Ambulatory Visit (INDEPENDENT_AMBULATORY_CARE_PROVIDER_SITE_OTHER): Payer: Medicare PPO | Admitting: Psychology

## 2019-12-20 DIAGNOSIS — J301 Allergic rhinitis due to pollen: Secondary | ICD-10-CM | POA: Diagnosis not present

## 2019-12-20 DIAGNOSIS — J3089 Other allergic rhinitis: Secondary | ICD-10-CM | POA: Diagnosis not present

## 2019-12-20 DIAGNOSIS — F411 Generalized anxiety disorder: Secondary | ICD-10-CM | POA: Diagnosis not present

## 2019-12-20 DIAGNOSIS — J3081 Allergic rhinitis due to animal (cat) (dog) hair and dander: Secondary | ICD-10-CM | POA: Diagnosis not present

## 2019-12-21 DIAGNOSIS — M351 Other overlap syndromes: Secondary | ICD-10-CM | POA: Diagnosis not present

## 2019-12-21 DIAGNOSIS — Z791 Long term (current) use of non-steroidal anti-inflammatories (NSAID): Secondary | ICD-10-CM | POA: Diagnosis not present

## 2019-12-24 MED FILL — GABAPENTIN 600 MG TABLET: 600 | 30 days supply | Qty: 135 | Fill #2

## 2020-01-01 DIAGNOSIS — J301 Allergic rhinitis due to pollen: Secondary | ICD-10-CM | POA: Diagnosis not present

## 2020-01-01 DIAGNOSIS — J3089 Other allergic rhinitis: Secondary | ICD-10-CM | POA: Diagnosis not present

## 2020-01-01 DIAGNOSIS — J3081 Allergic rhinitis due to animal (cat) (dog) hair and dander: Secondary | ICD-10-CM | POA: Diagnosis not present

## 2020-01-03 MED FILL — ESTRADIOL 0.1 MG/GM CREA: 0.1 | 90 days supply | Qty: 43 | Fill #1

## 2020-01-03 MED FILL — MELOXICAM 15 MG TABLET: 15 | 30 days supply | Qty: 30 | Fill #1

## 2020-01-03 MED FILL — METHOCARBAMOL 500 MG TABS: 500 | 15 days supply | Qty: 30 | Fill #1

## 2020-01-03 MED FILL — ALENDRONATE NA 70 MG TAB: 70 | 28 days supply | Qty: 4 | Fill #5

## 2020-01-03 MED FILL — traMADol HCL 50 MG TABS: 50 | 10 days supply | Qty: 60 | Fill #2

## 2020-01-10 MED FILL — buPROPion HCL ER (XL) 150 M: 150 | 30 days supply | Qty: 90 | Fill #1

## 2020-01-10 MED FILL — FINASTERIDE 5 MG TABLET: 5 | 30 days supply | Qty: 30 | Fill #4

## 2020-01-11 ENCOUNTER — Ambulatory Visit: Payer: Medicare PPO | Admitting: Family Medicine

## 2020-01-14 DIAGNOSIS — J3089 Other allergic rhinitis: Secondary | ICD-10-CM | POA: Diagnosis not present

## 2020-01-14 DIAGNOSIS — J3081 Allergic rhinitis due to animal (cat) (dog) hair and dander: Secondary | ICD-10-CM | POA: Diagnosis not present

## 2020-01-14 DIAGNOSIS — J301 Allergic rhinitis due to pollen: Secondary | ICD-10-CM | POA: Diagnosis not present

## 2020-01-15 ENCOUNTER — Ambulatory Visit: Payer: Medicare PPO | Admitting: Psychology

## 2020-01-17 ENCOUNTER — Ambulatory Visit: Payer: Medicare PPO | Admitting: Psychology

## 2020-01-21 ENCOUNTER — Telehealth: Payer: Self-pay

## 2020-01-21 NOTE — Telephone Encounter (Signed)
Robin Conrad wanted to know if she needs another pneumonia vaccine. Please advise.

## 2020-01-21 NOTE — Telephone Encounter (Signed)
NO she is good. Is her chart saying she is due?

## 2020-01-21 NOTE — Telephone Encounter (Signed)
Left message advising of recommendations. Her MyChart did not say she was due. She just asked because her husband received the pneumonia vaccine.

## 2020-01-24 MED FILL — GABAPENTIN 600 MG TABLET: 600 | 30 days supply | Qty: 135 | Fill #3

## 2020-01-25 DIAGNOSIS — H35372 Puckering of macula, left eye: Secondary | ICD-10-CM | POA: Diagnosis not present

## 2020-01-25 DIAGNOSIS — H2513 Age-related nuclear cataract, bilateral: Secondary | ICD-10-CM | POA: Diagnosis not present

## 2020-01-28 DIAGNOSIS — J3089 Other allergic rhinitis: Secondary | ICD-10-CM | POA: Diagnosis not present

## 2020-01-28 DIAGNOSIS — J301 Allergic rhinitis due to pollen: Secondary | ICD-10-CM | POA: Diagnosis not present

## 2020-01-28 DIAGNOSIS — J3081 Allergic rhinitis due to animal (cat) (dog) hair and dander: Secondary | ICD-10-CM | POA: Diagnosis not present

## 2020-01-30 ENCOUNTER — Other Ambulatory Visit: Payer: Self-pay | Admitting: Family Medicine

## 2020-01-30 MED FILL — ALENDRONATE NA 70 MG TAB: 70 | 28 days supply | Qty: 4 | Fill #6

## 2020-01-30 MED FILL — LEVOTHYROXINE SODIUM 50 MCG: 50 | 90 days supply | Qty: 90 | Fill #1

## 2020-01-31 ENCOUNTER — Other Ambulatory Visit: Payer: Self-pay | Admitting: Family Medicine

## 2020-02-01 ENCOUNTER — Other Ambulatory Visit: Payer: Self-pay | Admitting: Family Medicine

## 2020-02-01 MED FILL — traMADol HCL 50 MG TABS: 50 | 10 days supply | Qty: 60 | Fill #0

## 2020-02-04 ENCOUNTER — Other Ambulatory Visit (HOSPITAL_BASED_OUTPATIENT_CLINIC_OR_DEPARTMENT_OTHER): Payer: Self-pay | Admitting: Family Medicine

## 2020-02-04 MED FILL — METHOCARBAMOL 500 MG TABS: 500 | 15 days supply | Qty: 30 | Fill #0

## 2020-02-04 MED FILL — MELOXICAM 15 MG TABLET: 15 | 30 days supply | Qty: 30 | Fill #0

## 2020-02-11 ENCOUNTER — Other Ambulatory Visit (HOSPITAL_BASED_OUTPATIENT_CLINIC_OR_DEPARTMENT_OTHER): Payer: Self-pay | Admitting: Family Medicine

## 2020-02-11 ENCOUNTER — Other Ambulatory Visit: Payer: Self-pay | Admitting: Family Medicine

## 2020-02-11 DIAGNOSIS — J301 Allergic rhinitis due to pollen: Secondary | ICD-10-CM | POA: Diagnosis not present

## 2020-02-11 DIAGNOSIS — J3089 Other allergic rhinitis: Secondary | ICD-10-CM | POA: Diagnosis not present

## 2020-02-11 DIAGNOSIS — J3081 Allergic rhinitis due to animal (cat) (dog) hair and dander: Secondary | ICD-10-CM | POA: Diagnosis not present

## 2020-02-11 MED FILL — buPROPion HCL ER (XL) 150 M: 150 | 30 days supply | Qty: 90 | Fill #0

## 2020-02-11 MED FILL — FINASTERIDE 5 MG TABLET: 5 | 30 days supply | Qty: 30 | Fill #5

## 2020-02-12 ENCOUNTER — Other Ambulatory Visit (HOSPITAL_BASED_OUTPATIENT_CLINIC_OR_DEPARTMENT_OTHER): Payer: Self-pay | Admitting: Allergy

## 2020-02-12 MED FILL — ALBUTEROL SULFATE HFA 108 (: 108 (90 BAS | 17 days supply | Qty: 9 | Fill #0

## 2020-02-14 ENCOUNTER — Ambulatory Visit: Payer: Medicare PPO | Admitting: Psychology

## 2020-02-15 ENCOUNTER — Ambulatory Visit (INDEPENDENT_AMBULATORY_CARE_PROVIDER_SITE_OTHER): Payer: Medicare PPO | Admitting: Psychology

## 2020-02-15 DIAGNOSIS — F411 Generalized anxiety disorder: Secondary | ICD-10-CM

## 2020-02-25 DIAGNOSIS — J3081 Allergic rhinitis due to animal (cat) (dog) hair and dander: Secondary | ICD-10-CM | POA: Diagnosis not present

## 2020-02-25 DIAGNOSIS — J3089 Other allergic rhinitis: Secondary | ICD-10-CM | POA: Diagnosis not present

## 2020-02-25 DIAGNOSIS — J301 Allergic rhinitis due to pollen: Secondary | ICD-10-CM | POA: Diagnosis not present

## 2020-02-28 MED FILL — ALENDRONATE NA 70 MG TAB: 70 | 28 days supply | Qty: 4 | Fill #7

## 2020-02-28 MED FILL — GABAPENTIN 600 MG TABLET: 600 | 30 days supply | Qty: 135 | Fill #4

## 2020-02-28 MED FILL — traMADol HCL 50 MG TABS: 50 | 10 days supply | Qty: 60 | Fill #1

## 2020-03-03 ENCOUNTER — Other Ambulatory Visit (HOSPITAL_BASED_OUTPATIENT_CLINIC_OR_DEPARTMENT_OTHER): Payer: Self-pay | Admitting: Family Medicine

## 2020-03-03 DIAGNOSIS — L820 Inflamed seborrheic keratosis: Secondary | ICD-10-CM | POA: Diagnosis not present

## 2020-03-03 DIAGNOSIS — L638 Other alopecia areata: Secondary | ICD-10-CM | POA: Diagnosis not present

## 2020-03-05 MED FILL — MELOXICAM 15 MG TABLET: 15 | 30 days supply | Qty: 30 | Fill #1

## 2020-03-05 MED FILL — METHOCARBAMOL 500 MG TABS: 500 | 15 days supply | Qty: 30 | Fill #1

## 2020-03-13 ENCOUNTER — Ambulatory Visit (INDEPENDENT_AMBULATORY_CARE_PROVIDER_SITE_OTHER): Payer: Medicare PPO | Admitting: Psychology

## 2020-03-13 DIAGNOSIS — F411 Generalized anxiety disorder: Secondary | ICD-10-CM

## 2020-03-17 ENCOUNTER — Ambulatory Visit: Payer: Medicare PPO | Admitting: Family Medicine

## 2020-03-18 MED FILL — buPROPion HCL ER (XL) 150 M: 150 | 30 days supply | Qty: 90 | Fill #1

## 2020-03-18 MED FILL — FINASTERIDE 5 MG TABLET: 5 | 30 days supply | Qty: 30 | Fill #0

## 2020-03-20 ENCOUNTER — Other Ambulatory Visit: Payer: Self-pay | Admitting: Family Medicine

## 2020-03-20 ENCOUNTER — Other Ambulatory Visit: Payer: Self-pay

## 2020-03-20 ENCOUNTER — Encounter: Payer: Self-pay | Admitting: Family Medicine

## 2020-03-20 ENCOUNTER — Ambulatory Visit (INDEPENDENT_AMBULATORY_CARE_PROVIDER_SITE_OTHER): Payer: Medicare PPO | Admitting: Family Medicine

## 2020-03-20 VITALS — BP 132/57 | HR 100 | Ht 66.0 in | Wt 116.2 lb

## 2020-03-20 DIAGNOSIS — F411 Generalized anxiety disorder: Secondary | ICD-10-CM

## 2020-03-20 DIAGNOSIS — E039 Hypothyroidism, unspecified: Secondary | ICD-10-CM | POA: Diagnosis not present

## 2020-03-20 DIAGNOSIS — M25511 Pain in right shoulder: Secondary | ICD-10-CM | POA: Diagnosis not present

## 2020-03-20 DIAGNOSIS — M797 Fibromyalgia: Secondary | ICD-10-CM

## 2020-03-20 DIAGNOSIS — M359 Systemic involvement of connective tissue, unspecified: Secondary | ICD-10-CM | POA: Diagnosis not present

## 2020-03-20 MED ORDER — SERTRALINE HCL 50 MG PO TABS: ORAL_TABLET | ORAL | 0 refills | Status: DC

## 2020-03-20 MED FILL — SERTRALINE HCL 50 MG TABLET: 50 | 26 days supply | Qty: 26 | Fill #0
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
## 2020-03-21 DIAGNOSIS — M351 Other overlap syndromes: Secondary | ICD-10-CM | POA: Diagnosis not present

## 2020-03-21 DIAGNOSIS — Z791 Long term (current) use of non-steroidal anti-inflammatories (NSAID): Secondary | ICD-10-CM | POA: Diagnosis not present

## 2020-03-21 NOTE — Assessment & Plan Note (Signed)
Due to check thyroid.

## 2020-03-21 NOTE — Assessment & Plan Note (Signed)
Still sturggling significantly with her anxiety and just seems to be really ramped up lately.  We discussed maybe going back on a daily medication she had taken something I think a few years ago Celexa.  She says she is willing to try something she does not want anything that is habit-forming.  Recommend a trial of sertraline.  Start with half a tab daily for 8 days and then increase to whole tab and like to see her back in 4 to 6 weeks to make sure that she is doing well and so that we can adjust the medication if needed.

## 2020-03-25 ENCOUNTER — Ambulatory Visit (INDEPENDENT_AMBULATORY_CARE_PROVIDER_SITE_OTHER): Payer: Medicare PPO | Admitting: Psychology

## 2020-03-25 DIAGNOSIS — F411 Generalized anxiety disorder: Secondary | ICD-10-CM | POA: Diagnosis not present

## 2020-03-25 DIAGNOSIS — J3081 Allergic rhinitis due to animal (cat) (dog) hair and dander: Secondary | ICD-10-CM | POA: Diagnosis not present

## 2020-03-25 DIAGNOSIS — J3089 Other allergic rhinitis: Secondary | ICD-10-CM | POA: Diagnosis not present

## 2020-03-25 DIAGNOSIS — J301 Allergic rhinitis due to pollen: Secondary | ICD-10-CM | POA: Diagnosis not present

## 2020-03-26 DIAGNOSIS — M359 Systemic involvement of connective tissue, unspecified: Secondary | ICD-10-CM | POA: Diagnosis not present

## 2020-03-26 DIAGNOSIS — F411 Generalized anxiety disorder: Secondary | ICD-10-CM | POA: Diagnosis not present

## 2020-03-26 DIAGNOSIS — E039 Hypothyroidism, unspecified: Secondary | ICD-10-CM | POA: Diagnosis not present

## 2020-03-27 LAB — COMPLETE METABOLIC PANEL WITH GFR
AG Ratio: 2.1 (calc) (ref 1.0–2.5)
ALT: 35 U/L — ABNORMAL HIGH (ref 6–29)
AST: 30 U/L (ref 10–35)
Albumin: 4.1 g/dL (ref 3.6–5.1)
Alkaline phosphatase (APISO): 71 U/L (ref 37–153)
BUN: 22 mg/dL (ref 7–25)
CO2: 31 mmol/L (ref 20–32)
Calcium: 9.5 mg/dL (ref 8.6–10.4)
Chloride: 102 mmol/L (ref 98–110)
Creat: 0.82 mg/dL (ref 0.60–0.93)
GFR, Est African American: 84 mL/min/{1.73_m2} (ref >=60)
GFR, Est Non African American: 72 mL/min/{1.73_m2} (ref >=60)
Globulin: 2 g/dL (calc) (ref 1.9–3.7)
Glucose, Bld: 74 mg/dL (ref 65–99)
Potassium: 4 mmol/L (ref 3.5–5.3)
Sodium: 139 mmol/L (ref 135–146)
Total Bilirubin: 0.6 mg/dL (ref 0.2–1.2)
Total Protein: 6.1 g/dL (ref 6.1–8.1)

## 2020-03-27 LAB — LIPID PANEL
Cholesterol: 153 mg/dL (ref <200)
HDL: 74 mg/dL (ref >=50)
LDL Cholesterol (Calc): 61 mg/dL (calc)
Non-HDL Cholesterol (Calc): 79 mg/dL (calc) (ref <130)
Total CHOL/HDL Ratio: 2.1 (calc) (ref <5.0)
Triglycerides: 96 mg/dL (ref <150)

## 2020-03-27 LAB — CBC
HCT: 40.4 % (ref 35.0–45.0)
Hemoglobin: 13.6 g/dL (ref 11.7–15.5)
MCH: 31.5 pg (ref 27.0–33.0)
MCHC: 33.7 g/dL (ref 32.0–36.0)
MCV: 93.5 fL (ref 80.0–100.0)
MPV: 10.6 fL (ref 7.5–12.5)
Platelets: 211 10*3/uL (ref 140–400)
RBC: 4.32 Million/uL (ref 3.80–5.10)
RDW: 11.8 % (ref 11.0–15.0)
WBC: 4.8 10*3/uL (ref 3.8–10.8)

## 2020-03-27 LAB — TSH: TSH: 2.66 m[IU]/L (ref 0.40–4.50)

## 2020-03-28 ENCOUNTER — Other Ambulatory Visit: Payer: Self-pay | Admitting: *Deleted

## 2020-03-28 DIAGNOSIS — R748 Abnormal levels of other serum enzymes: Secondary | ICD-10-CM

## 2020-03-28 DIAGNOSIS — R799 Abnormal finding of blood chemistry, unspecified: Secondary | ICD-10-CM

## 2020-03-28 MED FILL — ALENDRONATE NA 70 MG TAB: 70 | 28 days supply | Qty: 4 | Fill #8

## 2020-04-03 MED FILL — traMADol HCL 50 MG TABS: 50 | 10 days supply | Qty: 60 | Fill #2

## 2020-04-03 MED FILL — GABAPENTIN 600 MG TABLET: 600 | 30 days supply | Qty: 135 | Fill #5

## 2020-04-04 ENCOUNTER — Other Ambulatory Visit: Payer: Self-pay | Admitting: Family Medicine

## 2020-04-04 MED FILL — MELOXICAM 15 MG TABLET: 15 | 30 days supply | Qty: 30 | Fill #0

## 2020-04-04 MED FILL — METHOCARBAMOL 500 MG TABLET: 500 | 15 days supply | Qty: 30 | Fill #0

## 2020-04-07 ENCOUNTER — Ambulatory Visit (INDEPENDENT_AMBULATORY_CARE_PROVIDER_SITE_OTHER): Payer: Medicare PPO

## 2020-04-07 ENCOUNTER — Other Ambulatory Visit: Payer: Self-pay

## 2020-04-07 ENCOUNTER — Ambulatory Visit (INDEPENDENT_AMBULATORY_CARE_PROVIDER_SITE_OTHER): Payer: Medicare PPO | Admitting: Sports Medicine

## 2020-04-07 ENCOUNTER — Ambulatory Visit: Payer: Medicare PPO | Admitting: Family Medicine

## 2020-04-07 DIAGNOSIS — M25511 Pain in right shoulder: Secondary | ICD-10-CM | POA: Diagnosis not present

## 2020-04-07 DIAGNOSIS — M7541 Impingement syndrome of right shoulder: Secondary | ICD-10-CM

## 2020-04-07 IMAGING — DX DG SHOULDER 2+V*R*
3 series · 3 of 3 positions shown · non-contrast
Comparison: None.

CLINICAL DATA: Right shoulder pain for 2 months.

EXAM:
RIGHT SHOULDER - 2+ VIEW

[shoulder grashey]
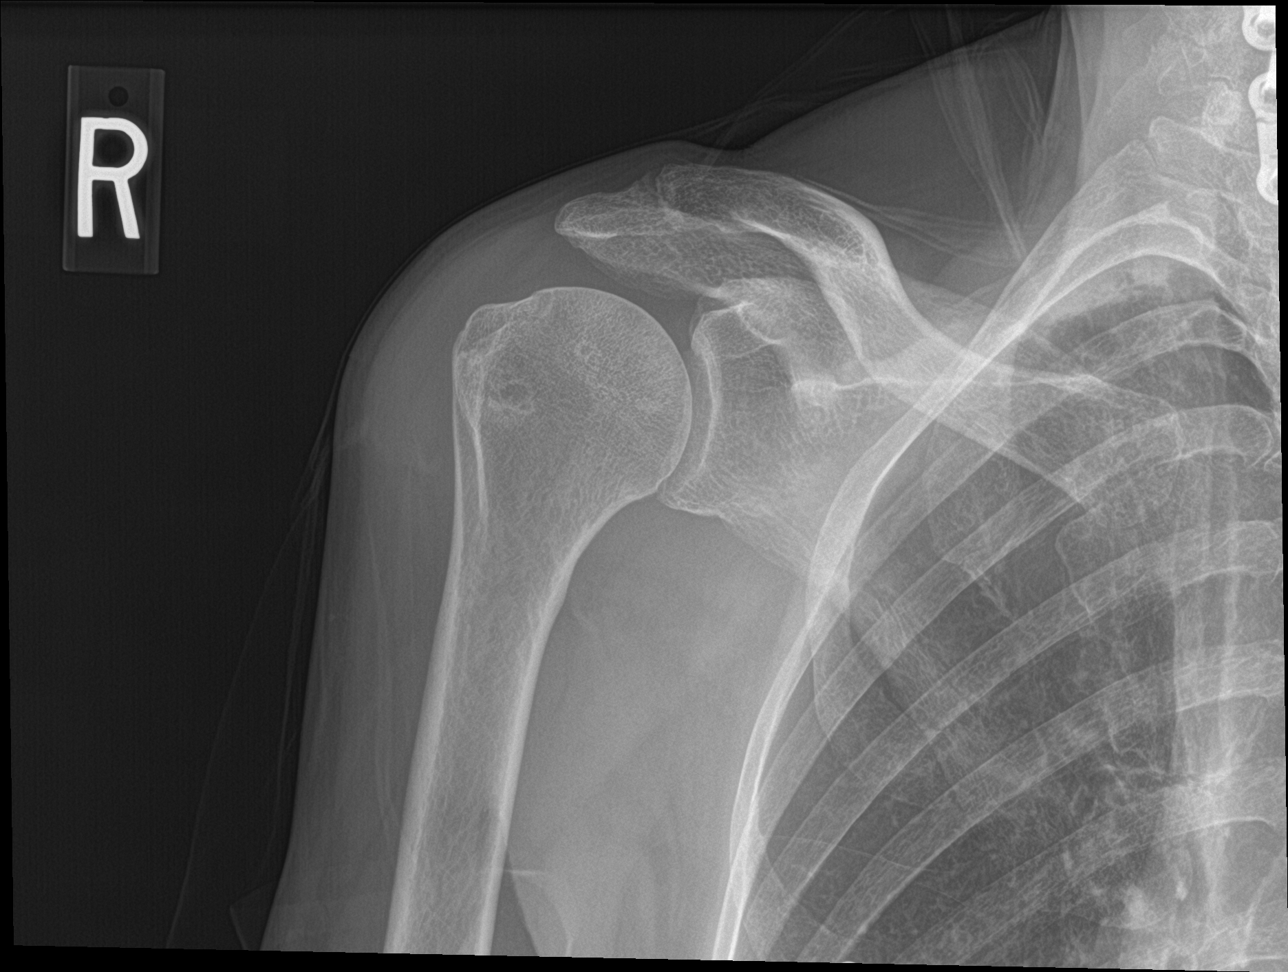

[shoulder y view]
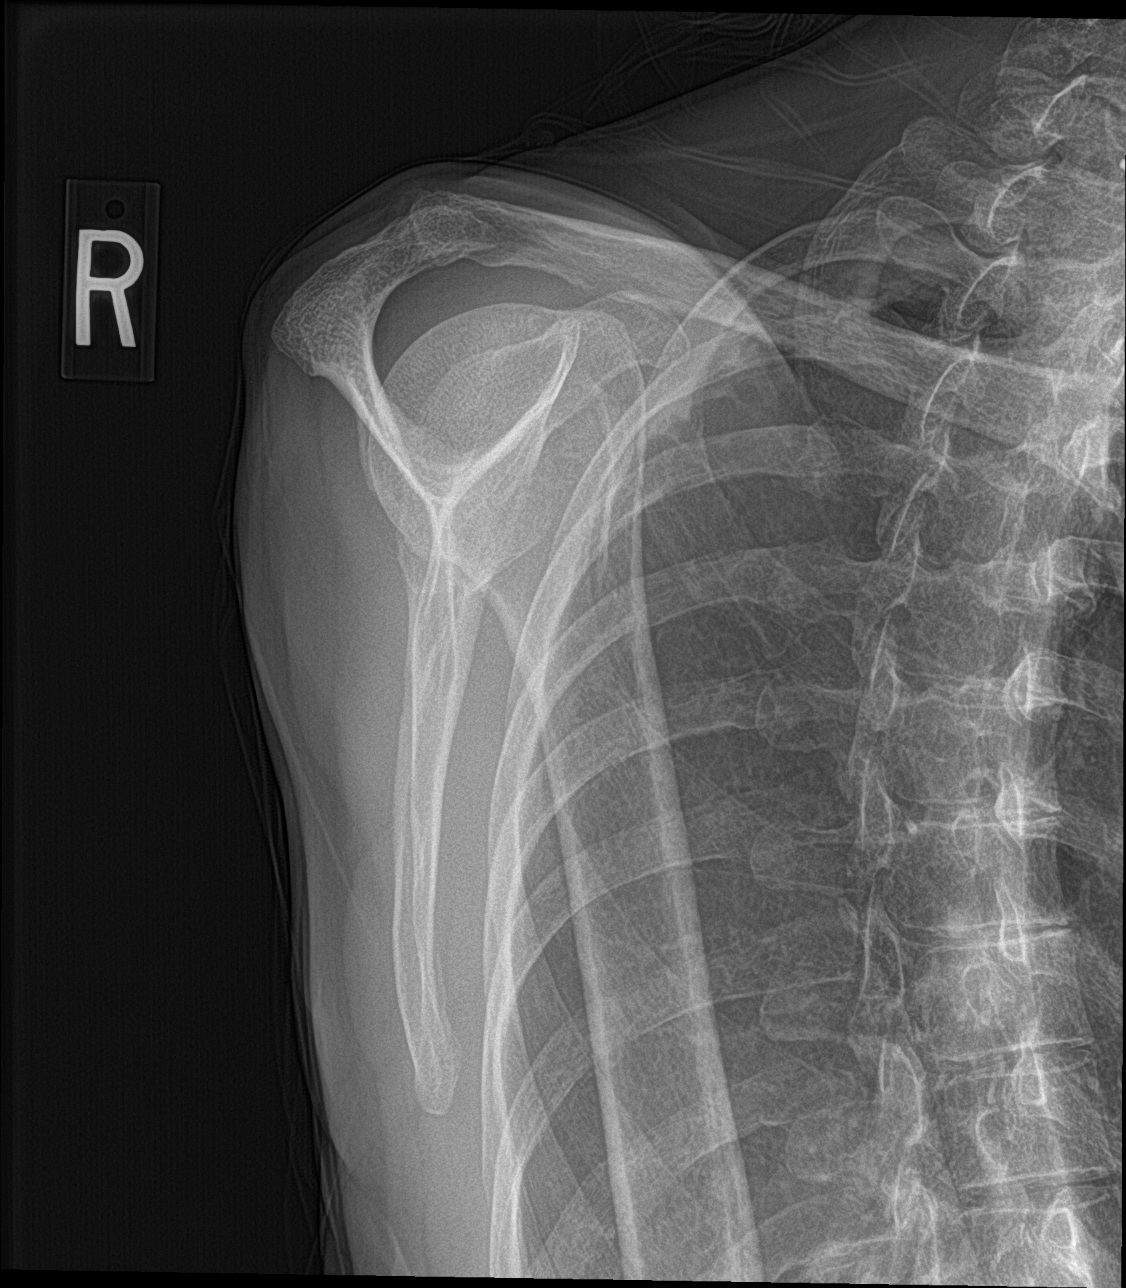

[shoulder axillary]
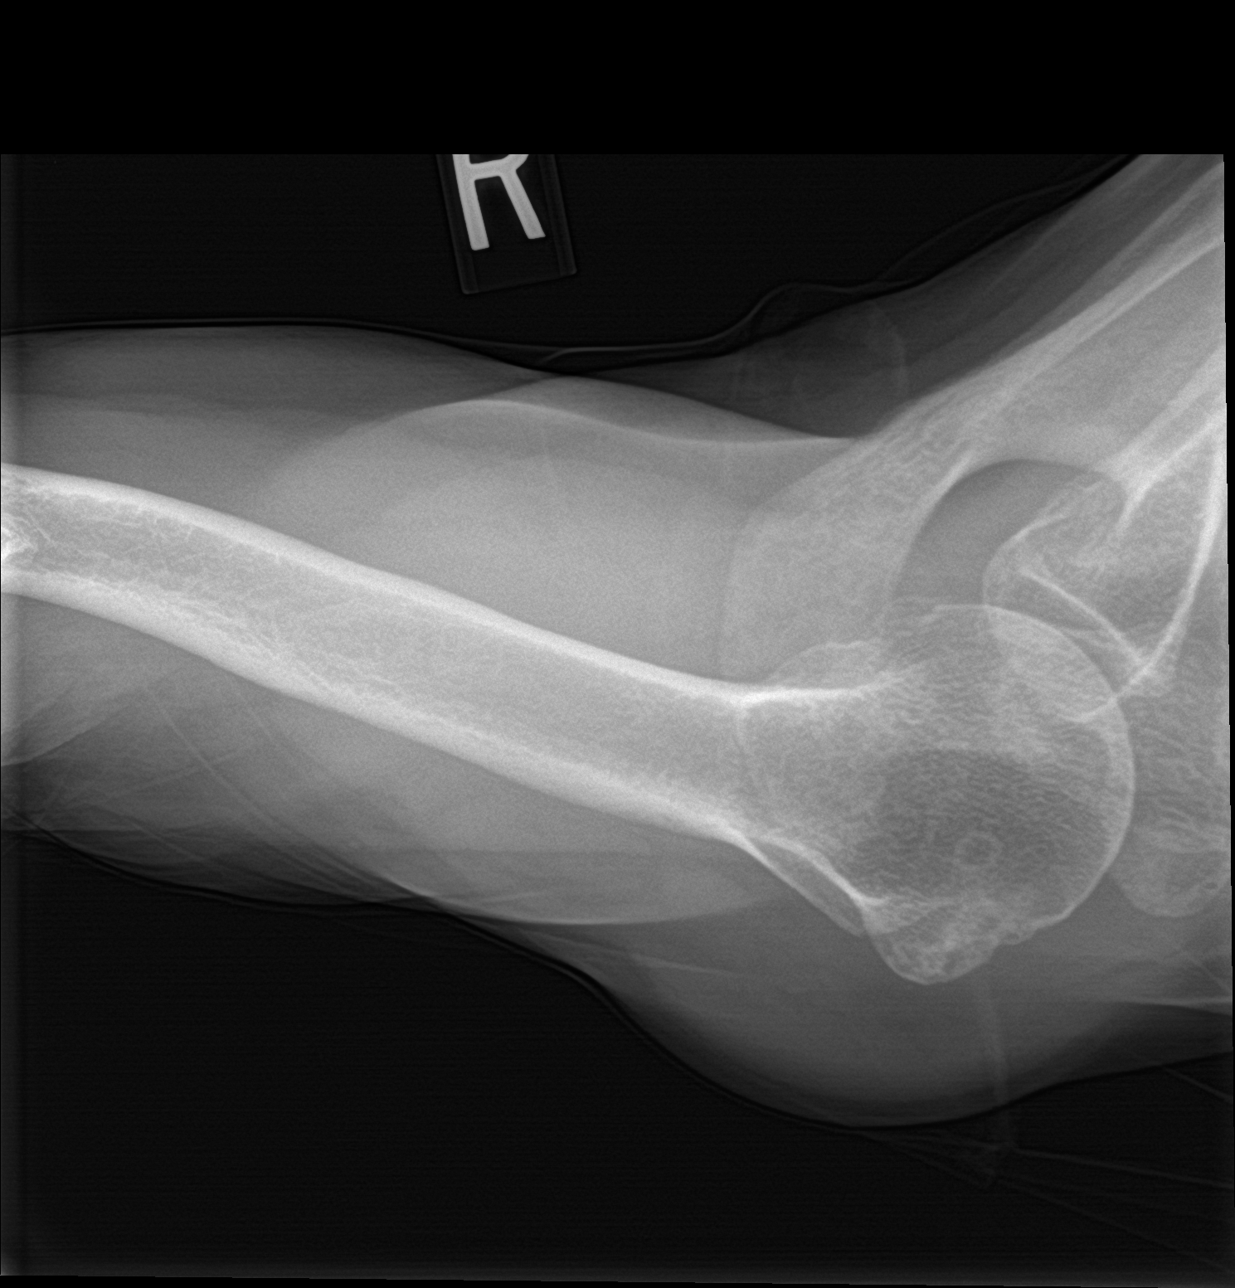

[3 of 3 positions shown; findings below may reference images not displayed]

FINDINGS: Normal alignment with approximation of the joints. No fracture. Mild
glenohumeral and acromioclavicular joint space loss and
osteophytosis. Glenoid fossa subchondral sclerosis. Small humeral
head geode. Soft tissues are within normal limits.
IMPRESSION: No acute osseous abnormality.

Mild glenohumeral and acromioclavicular osteoarthrosis.

## 2020-04-07 NOTE — Assessment & Plan Note (Signed)
Shoulder pain, localized over the deltoid and worse with abduction and overhead activities. She did see her PCP and was given some conservative treatment as appropriate, unfortunately has not noted any improvement, she is plateaued, she has impingement signs on exam. Subacromial injection today, x-rays, return to see me in 6 weeks, MRI if no better.

## 2020-04-07 NOTE — Progress Notes (Signed)
    Procedures performed today:    Procedure: Real-time Ultrasound Guided injection of the right subacromial bursa Device: Samsung HS60  Verbal informed consent obtained.  Time-out conducted.  Noted no overlying erythema, induration, or other signs of local infection.  Skin prepped in a sterile fashion.  Local anesthesia: Topical Ethyl chloride.  With sterile technique and under real time ultrasound guidance: 1 cc Kenalog 40, 1 cc lidocaine, 1 cc bupivacaine injected easily Completed without difficulty  Advised to call if fevers/chills, erythema, induration, drainage, or persistent bleeding.  Images permanently stored and available for review in PACS.  Impression: Technically successful ultrasound guided injection.  Independent interpretation of notes and tests performed by another provider:   None.  Brief History, Exam, Impression, and Recommendations:    Impingement syndrome, shoulder, right Shoulder pain, localized over the deltoid and worse with abduction and overhead activities. She did see [facility] PCP and was given some conservative treatment as appropriate, unfortunately has not noted any improvement, she is plateaued, she has impingement signs on exam. Subacromial injection today, x-rays, return to see me in 6 weeks, MRI if no better.    ___________________________________________ Robin [facility]. Dianah Field, M.D., ABFM., CAQSM. Primary Care and [place] Instructor of [place] [facility]

## 2020-04-08 DIAGNOSIS — J3081 Allergic rhinitis due to animal (cat) (dog) hair and dander: Secondary | ICD-10-CM | POA: Diagnosis not present

## 2020-04-08 DIAGNOSIS — J3089 Other allergic rhinitis: Secondary | ICD-10-CM | POA: Diagnosis not present

## 2020-04-08 DIAGNOSIS — J301 Allergic rhinitis due to pollen: Secondary | ICD-10-CM | POA: Diagnosis not present

## 2020-04-17 ENCOUNTER — Other Ambulatory Visit: Payer: Self-pay | Admitting: Family Medicine

## 2020-04-17 DIAGNOSIS — F411 Generalized anxiety disorder: Secondary | ICD-10-CM

## 2020-04-17 MED FILL — FINASTERIDE 5 MG TABLET: 5 | 30 days supply | Qty: 30 | Fill #1

## 2020-04-18 ENCOUNTER — Other Ambulatory Visit: Payer: Self-pay | Admitting: Family Medicine

## 2020-04-18 ENCOUNTER — Telehealth: Payer: Self-pay

## 2020-04-18 DIAGNOSIS — F411 Generalized anxiety disorder: Secondary | ICD-10-CM

## 2020-04-18 MED ORDER — SERTRALINE HCL 50 MG PO TABS: 50.0000 mg | ORAL_TABLET | Freq: Every day | ORAL | 1 refills | Status: DC

## 2020-04-18 MED FILL — SERTRALINE HCL 50 MG TABLET: 50 | 30 days supply | Qty: 30 | Fill #0

## 2020-04-18 NOTE — Telephone Encounter (Signed)
Robin Conrad InterStem device for bladder and bowel problems. She has had this for 8 years. She states the InterStem remote stopped working. The InterStem has died. She is having it replaced in March. She is taking Imodium and Pepto Bismol. It did slow the diarrhea some. She still had bloating and some diarrhea. 4 am this morning she had leaking of bowel.   Advised to call OBGYN to have the InterStem replaced sooner than later. Also advised to call back if she starts having a fever.   She normally has constipation with her IBS. Denies chronic diarrhea, recent change in diet, family members with diarrhea, recent travel to a foreign country, fever unresponsive to fever-reducing measures, new medications or blood in stool.   Advised of clear liquid diet first 12 to 24 hours. Sips of water, flat soda, clear broth, gelatin (not red) or flavored ice.  Next 12 hours, progress to eating soups (avoiding cream soups), dry toast, soda crackers, white rice, pretzels, bananas, applesauce and potatoes.  Progress to a regular diet after soft formed stools occur. Avoid dairy products, citrus juices, raw fruits and vegetables, and fried or spicy foods for 2 to 5 days after diarrhea subsides. After 6 hours of diarrhea and cramping, or if pain persists, OTC antidiarrheal medications (Imodium, Kaopectate, Pepto-Bismol) can be used. Follow the instructions on the label. Acetaminophen can be given for fever. Do not give aspirin to a child. Avoid aspirin-like products if age < 25 years old. Avoid acetaminophen if liver disease is present. Avoid ibuprofen if kidney disease or stomach problems exist or in the case of pregnancy. Follow the directions on the label.

## 2020-04-18 NOTE — Telephone Encounter (Signed)
Agree with documentation as above.   Yousuf Ager, MD  

## 2020-04-21 ENCOUNTER — Other Ambulatory Visit: Payer: Self-pay | Admitting: Family Medicine

## 2020-04-21 ENCOUNTER — Telehealth: Payer: Self-pay

## 2020-04-21 MED FILL — buPROPion HCL ER (XL) 150 M: 150 | 30 days supply | Qty: 90 | Fill #0

## 2020-04-21 NOTE — Telephone Encounter (Signed)
Patient reports having diarrhea for 4 days first thing in the morning. She repots having to change her sheets because of accidents as well. She states that it is dark. She did not request an appt or just stated that she wanted to let us know.

## 2020-04-21 NOTE — Telephone Encounter (Signed)
We can have her pick up stool card to check for blood.  Has she used lomotil in the past for diarrhea?

## 2020-04-22 ENCOUNTER — Other Ambulatory Visit: Payer: Self-pay

## 2020-04-22 ENCOUNTER — Other Ambulatory Visit: Payer: Self-pay | Admitting: Medical-Surgical

## 2020-04-22 ENCOUNTER — Encounter: Payer: Self-pay | Admitting: Medical-Surgical

## 2020-04-22 ENCOUNTER — Telehealth: Payer: Self-pay | Admitting: Gastroenterology

## 2020-04-22 ENCOUNTER — Ambulatory Visit (INDEPENDENT_AMBULATORY_CARE_PROVIDER_SITE_OTHER): Payer: Medicare PPO | Admitting: Medical-Surgical

## 2020-04-22 VITALS — BP 99/62 | HR 85 | Wt 119.1 lb

## 2020-04-22 DIAGNOSIS — K921 Melena: Secondary | ICD-10-CM | POA: Diagnosis not present

## 2020-04-22 DIAGNOSIS — R197 Diarrhea, unspecified: Secondary | ICD-10-CM | POA: Diagnosis not present

## 2020-04-22 DIAGNOSIS — R748 Abnormal levels of other serum enzymes: Secondary | ICD-10-CM | POA: Diagnosis not present

## 2020-04-22 MED ORDER — DIPHENOXYLATE-ATROPINE 2.5-0.025 MG PO TABS: ORAL_TABLET | ORAL | 0 refills | Status: DC

## 2020-04-22 MED FILL — DIPHENOXYLATE-ATROPINE 2.5-: 2.5-0.025 | 7 days supply | Qty: 30 | Fill #0
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
## 2020-04-22 NOTE — Telephone Encounter (Signed)
Pt experiencing diarrhea for two weeks. She had stool test with PCP and it was negative. However, she has been havig black stools. PCP wants pt to be seen in the next two days. Pls call her.

## 2020-04-22 NOTE — Telephone Encounter (Signed)
Spoke to patient who is requesting an appointment with dr Bryan Lemma to discuss options for her ongoing diarrhea

## 2020-04-23 LAB — CBC WITH DIFFERENTIAL/PLATELET
Absolute Monocytes: 479 cells/uL (ref 200–950)
Basophils Absolute: 51 cells/uL (ref 0–200)
Basophils Relative: 0.9 %
Eosinophils Absolute: 68 cells/uL (ref 15–500)
Eosinophils Relative: 1.2 %
HCT: 39.5 % (ref 35.0–45.0)
Hemoglobin: 13.4 g/dL (ref 11.7–15.5)
Lymphs Abs: 1391 cells/uL (ref 850–3900)
MCH: 31.5 pg (ref 27.0–33.0)
MCHC: 33.9 g/dL (ref 32.0–36.0)
MCV: 92.7 fL (ref 80.0–100.0)
MPV: 10.4 fL (ref 7.5–12.5)
Monocytes Relative: 8.4 %
Neutro Abs: 3711 cells/uL (ref 1500–7800)
Neutrophils Relative %: 65.1 %
Platelets: 195 10*3/uL (ref 140–400)
RBC: 4.26 10*6/uL (ref 3.80–5.10)
RDW: 12 % (ref 11.0–15.0)
Total Lymphocyte: 24.4 %
WBC: 5.7 10*3/uL (ref 3.8–10.8)

## 2020-04-23 NOTE — Addendum Note (Signed)
Addended by: Teddy Spike on: 04/23/2020 12:40 PM   Modules accepted: Orders

## 2020-04-24 ENCOUNTER — Ambulatory Visit: Payer: Medicare PPO | Admitting: Psychology

## 2020-04-24 DIAGNOSIS — Z888 Allergy status to other drugs, medicaments and biological substances status: Secondary | ICD-10-CM | POA: Diagnosis not present

## 2020-04-24 DIAGNOSIS — S62111A Displaced fracture of triquetrum [cuneiform] bone, right wrist, initial encounter for closed fracture: Secondary | ICD-10-CM | POA: Diagnosis not present

## 2020-04-24 DIAGNOSIS — E079 Disorder of thyroid, unspecified: Secondary | ICD-10-CM | POA: Diagnosis not present

## 2020-04-24 DIAGNOSIS — Z79899 Other long term (current) drug therapy: Secondary | ICD-10-CM | POA: Diagnosis not present

## 2020-04-24 DIAGNOSIS — Z7982 Long term (current) use of aspirin: Secondary | ICD-10-CM | POA: Diagnosis not present

## 2020-04-24 DIAGNOSIS — J301 Allergic rhinitis due to pollen: Secondary | ICD-10-CM | POA: Diagnosis not present

## 2020-04-24 DIAGNOSIS — S0101XA Laceration without foreign body of scalp, initial encounter: Secondary | ICD-10-CM | POA: Diagnosis not present

## 2020-04-24 DIAGNOSIS — J45909 Unspecified asthma, uncomplicated: Secondary | ICD-10-CM | POA: Diagnosis not present

## 2020-04-24 DIAGNOSIS — M797 Fibromyalgia: Secondary | ICD-10-CM | POA: Diagnosis not present

## 2020-04-24 DIAGNOSIS — J3081 Allergic rhinitis due to animal (cat) (dog) hair and dander: Secondary | ICD-10-CM | POA: Diagnosis not present

## 2020-04-24 DIAGNOSIS — Z885 Allergy status to narcotic agent status: Secondary | ICD-10-CM | POA: Diagnosis not present

## 2020-04-24 DIAGNOSIS — M25531 Pain in right wrist: Secondary | ICD-10-CM | POA: Diagnosis not present

## 2020-04-24 DIAGNOSIS — M542 Cervicalgia: Secondary | ICD-10-CM | POA: Diagnosis not present

## 2020-04-24 DIAGNOSIS — W01198A Fall on same level from slipping, tripping and stumbling with subsequent striking against other object, initial encounter: Secondary | ICD-10-CM | POA: Diagnosis not present

## 2020-04-24 DIAGNOSIS — J3089 Other allergic rhinitis: Secondary | ICD-10-CM | POA: Diagnosis not present

## 2020-04-24 DIAGNOSIS — S62111S Displaced fracture of triquetrum [cuneiform] bone, right wrist, sequela: Secondary | ICD-10-CM | POA: Diagnosis not present

## 2020-04-24 DIAGNOSIS — Z043 Encounter for examination and observation following other accident: Secondary | ICD-10-CM | POA: Diagnosis not present

## 2020-04-24 NOTE — Telephone Encounter (Signed)
Patient was seen by Joy in the office two days ago regarding these symptoms.

## 2020-04-28 ENCOUNTER — Other Ambulatory Visit (HOSPITAL_BASED_OUTPATIENT_CLINIC_OR_DEPARTMENT_OTHER): Payer: Self-pay

## 2020-04-28 LAB — COMPLETE METABOLIC PANEL WITH GFR
AG Ratio: 2.1 (calc) (ref 1.0–2.5)
ALT: 52 U/L — ABNORMAL HIGH (ref 6–29)
AST: 43 U/L — ABNORMAL HIGH (ref 10–35)
Albumin: 3.9 g/dL (ref 3.6–5.1)
Alkaline phosphatase (APISO): 73 U/L (ref 37–153)
BUN: 18 mg/dL (ref 7–25)
CO2: 34 mmol/L — ABNORMAL HIGH (ref 20–32)
Calcium: 9.4 mg/dL (ref 8.6–10.4)
Chloride: 105 mmol/L (ref 98–110)
Creat: 0.82 mg/dL (ref 0.60–0.93)
GFR, Est African American: 84 mL/min/{1.73_m2} (ref >=60)
GFR, Est Non African American: 72 mL/min/{1.73_m2} (ref >=60)
Globulin: 1.9 g/dL (calc) (ref 1.9–3.7)
Glucose, Bld: 77 mg/dL (ref 65–99)
Potassium: 4.1 mmol/L (ref 3.5–5.3)
Sodium: 142 mmol/L (ref 135–146)
Total Bilirubin: 0.5 mg/dL (ref 0.2–1.2)
Total Protein: 5.8 g/dL — ABNORMAL LOW (ref 6.1–8.1)

## 2020-04-28 LAB — REFLEX TIQ

## 2020-04-28 LAB — ACUTE HEP PANEL AND HEP B SURFACE AB
HEPATITIS C ANTIBODY REFILL$(REFL): NONREACTIVE
Hep A IgM: NONREACTIVE
Hep B C IgM: NONREACTIVE
Hepatitis B Surface Ag: NONREACTIVE
SIGNAL TO CUT-OFF: 0 (ref <1.00)

## 2020-04-28 MED FILL — AMOXICILLIN 500 MG TABS: 500 | 4 days supply | Qty: 16 | Fill #0

## 2020-04-29 ENCOUNTER — Encounter: Payer: Self-pay | Admitting: Family Medicine

## 2020-05-01 ENCOUNTER — Encounter: Payer: Self-pay | Admitting: Gastroenterology

## 2020-05-01 ENCOUNTER — Ambulatory Visit (INDEPENDENT_AMBULATORY_CARE_PROVIDER_SITE_OTHER): Payer: Medicare PPO | Admitting: Gastroenterology

## 2020-05-01 ENCOUNTER — Ambulatory Visit: Payer: Medicare PPO | Admitting: Family Medicine

## 2020-05-01 ENCOUNTER — Ambulatory Visit: Payer: Medicare PPO | Admitting: Sports Medicine

## 2020-05-01 VITALS — BP 112/64 | HR 91 | Ht 66.0 in | Wt 116.5 lb

## 2020-05-01 DIAGNOSIS — R7401 Elevation of levels of liver transaminase levels: Secondary | ICD-10-CM

## 2020-05-01 DIAGNOSIS — K921 Melena: Secondary | ICD-10-CM | POA: Diagnosis not present

## 2020-05-01 DIAGNOSIS — R197 Diarrhea, unspecified: Secondary | ICD-10-CM

## 2020-05-01 DIAGNOSIS — K219 Gastro-esophageal reflux disease without esophagitis: Secondary | ICD-10-CM

## 2020-05-01 MED FILL — ALENDRONATE NA 70 MG TAB: 70 | 28 days supply | Qty: 4 | Fill #9
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
## 2020-05-01 NOTE — Patient Instructions (Signed)
If you are age 71 or older, your body mass index should be between 23-30. Your Body mass index is 18.8 kg/m. If this is out of the aforementioned range listed, please consider follow up with your Primary Care Provider.  If you are age 41 or younger, your body mass index should be between 19-25. Your Body mass index is 18.8 kg/m. If this is out of the aformentioned range listed, please consider follow up with your Primary Care Provider.   Due to recent changes in healthcare laws, you may see the results of your imaging and laboratory studies on MyChart before your provider has had a chance to review them.  We understand that in some cases there may be results that are confusing or concerning to you. Not all laboratory results come back in the same time frame and the provider may be waiting for multiple results in order to interpret others.  Please give [place] 48 hours in order for your provider to thoroughly review all the results before contacting the office for clarification of your results.   Your provider has requested that you go to the basement level for lab work in 4 weeks . Press "B" on the elevator. The lab is located at the first door on the left as you exit the elevator.  Due to recent changes in healthcare laws, you may see the results of your imaging and laboratory studies on MyChart before your provider has had a chance to review them.  We understand that in some cases there may be results that are confusing or concerning to you. Not all laboratory results come back in the same time frame and the provider may be waiting for multiple results in order to interpret others.  Please give [place] 48 hours in order for your provider to thoroughly review all the results before contacting the office for clarification of your results.   Thank you for choosing me and [facility].  Vito Cirigliano, D.O.

## 2020-05-02 DIAGNOSIS — N952 Postmenopausal atrophic vaginitis: Secondary | ICD-10-CM | POA: Insufficient documentation

## 2020-05-02 DIAGNOSIS — N390 Urinary tract infection, site not specified: Secondary | ICD-10-CM | POA: Insufficient documentation

## 2020-05-02 DIAGNOSIS — R339 Retention of urine, unspecified: Secondary | ICD-10-CM | POA: Insufficient documentation

## 2020-05-05 ENCOUNTER — Encounter: Payer: Self-pay | Admitting: Family Medicine

## 2020-05-05 ENCOUNTER — Ambulatory Visit (INDEPENDENT_AMBULATORY_CARE_PROVIDER_SITE_OTHER): Payer: Medicare PPO | Admitting: Family Medicine

## 2020-05-05 ENCOUNTER — Other Ambulatory Visit: Payer: Self-pay | Admitting: Family Medicine

## 2020-05-05 ENCOUNTER — Other Ambulatory Visit: Payer: Self-pay

## 2020-05-05 VITALS — BP 111/54 | HR 90 | Ht 66.0 in | Wt 119.0 lb

## 2020-05-05 DIAGNOSIS — M797 Fibromyalgia: Secondary | ICD-10-CM

## 2020-05-05 DIAGNOSIS — S0101XA Laceration without foreign body of scalp, initial encounter: Secondary | ICD-10-CM | POA: Diagnosis not present

## 2020-05-05 DIAGNOSIS — F411 Generalized anxiety disorder: Secondary | ICD-10-CM

## 2020-05-05 DIAGNOSIS — Z1231 Encounter for screening mammogram for malignant neoplasm of breast: Secondary | ICD-10-CM

## 2020-05-05 DIAGNOSIS — M25531 Pain in right wrist: Secondary | ICD-10-CM | POA: Diagnosis not present

## 2020-05-05 MED ORDER — SERTRALINE HCL 50 MG PO TABS
50.0000 mg | ORAL_TABLET | Freq: Every day | ORAL | 0 refills | Status: DC
Start: 2020-05-05 — End: 2020-05-05

## 2020-05-05 MED ORDER — GABAPENTIN 600 MG PO TABS: ORAL_TABLET | ORAL | 5 refills | Status: DC

## 2020-05-05 MED ORDER — TRAMADOL HCL 50 MG PO TABS: 100.0000 mg | ORAL_TABLET | Freq: Three times a day (TID) | ORAL | 2 refills | Status: DC | PRN

## 2020-05-05 MED FILL — GABAPENTIN 600 MG TABLET: 600 | 30 days supply | Qty: 135 | Fill #0

## 2020-05-05 MED FILL — traMADol HCL 50 MG TABS: 50 | 10 days supply | Qty: 60 | Fill #0

## 2020-05-05 NOTE — Assessment & Plan Note (Signed)
Stable. Refilled the tramadol. On gabapentin.
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
## 2020-05-05 NOTE — Assessment & Plan Note (Signed)
Discussed will continue with 50mg  of sertraline. F/U in 3 months. Ok to move timing of med if needed to help with sleepiness.

## 2020-05-08 ENCOUNTER — Other Ambulatory Visit: Payer: Self-pay

## 2020-05-08 ENCOUNTER — Ambulatory Visit (INDEPENDENT_AMBULATORY_CARE_PROVIDER_SITE_OTHER): Payer: Medicare PPO

## 2020-05-08 DIAGNOSIS — Z1231 Encounter for screening mammogram for malignant neoplasm of breast: Secondary | ICD-10-CM | POA: Diagnosis not present

## 2020-05-08 IMAGING — MG MM DIGITAL SCREENING BILAT W/ TOMO AND CAD
8 series · 9 of 24 positions shown · non-contrast
Comparison: Previous exam(s).

CLINICAL DATA: Screening.

EXAM:
DIGITAL SCREENING BILATERAL MAMMOGRAM WITH TOMOSYNTHESIS AND CAD
TECHNIQUE: Bilateral screening digital craniocaudal and mediolateral oblique
mammograms were obtained. Bilateral screening digital breast
tomosynthesis was performed. The images were evaluated with
computer-aided detection.

[R CC synth-2D]
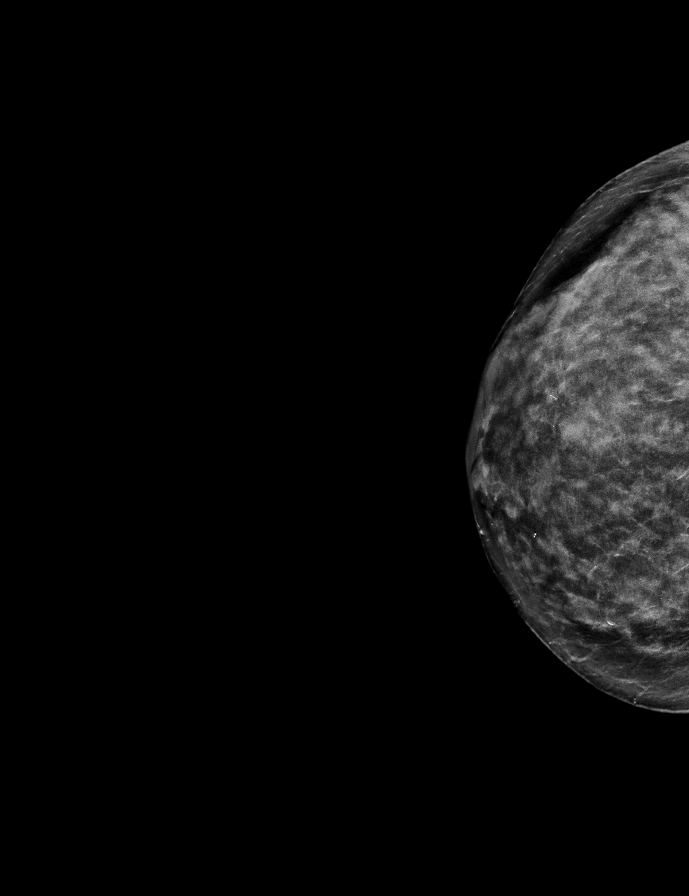

[L CC synth-2D]
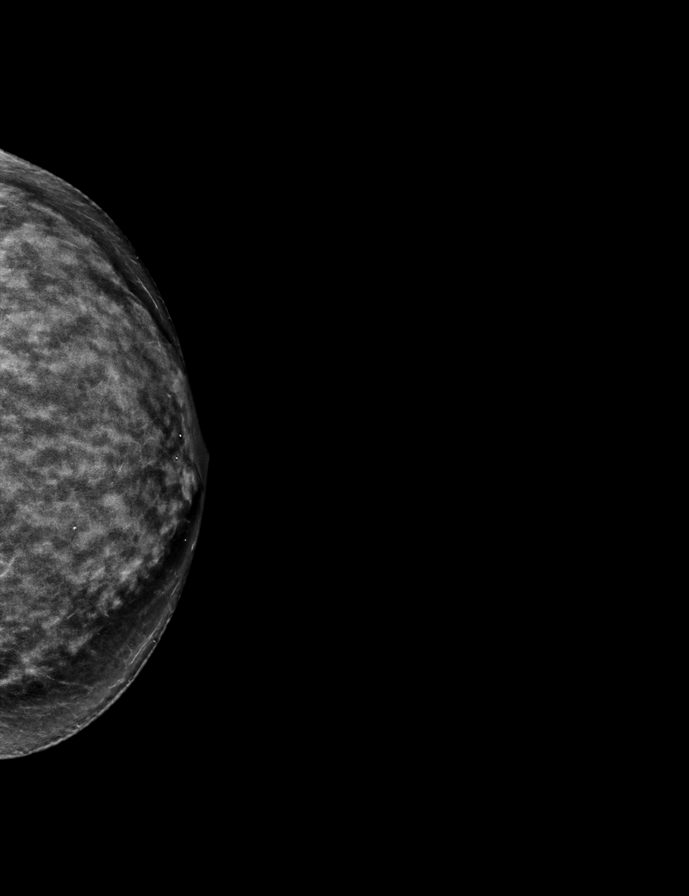

[R MLO synth-2D]
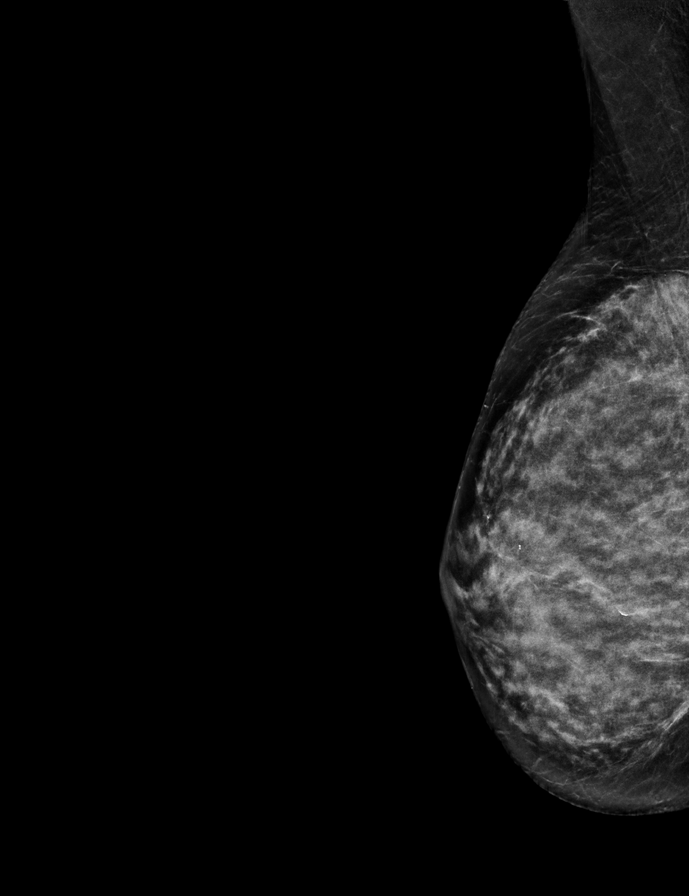

[L MLO synth-2D]
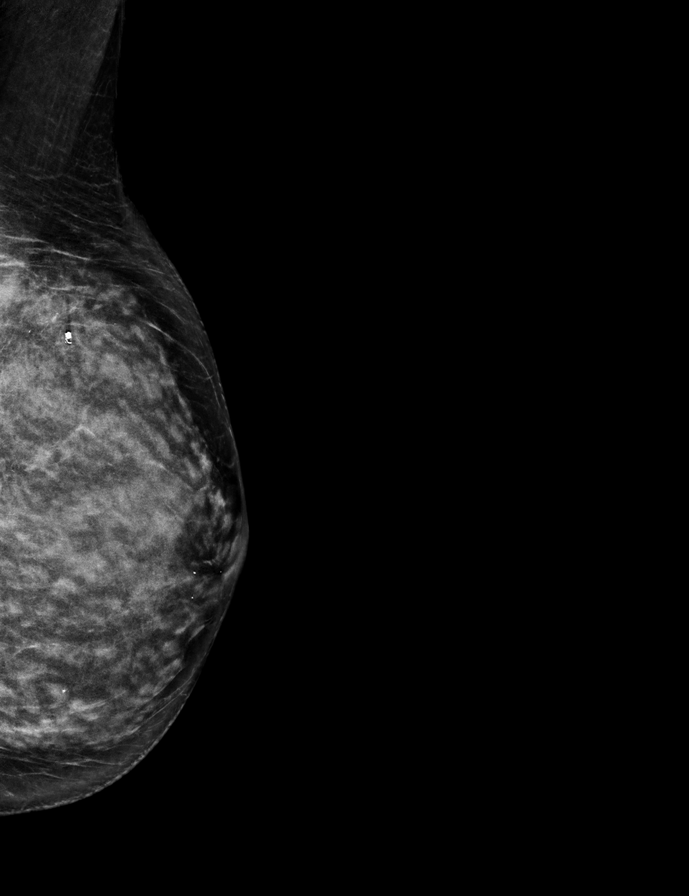

[R MLO tomo · 2 of 51 frames shown]
[frame 17/51]
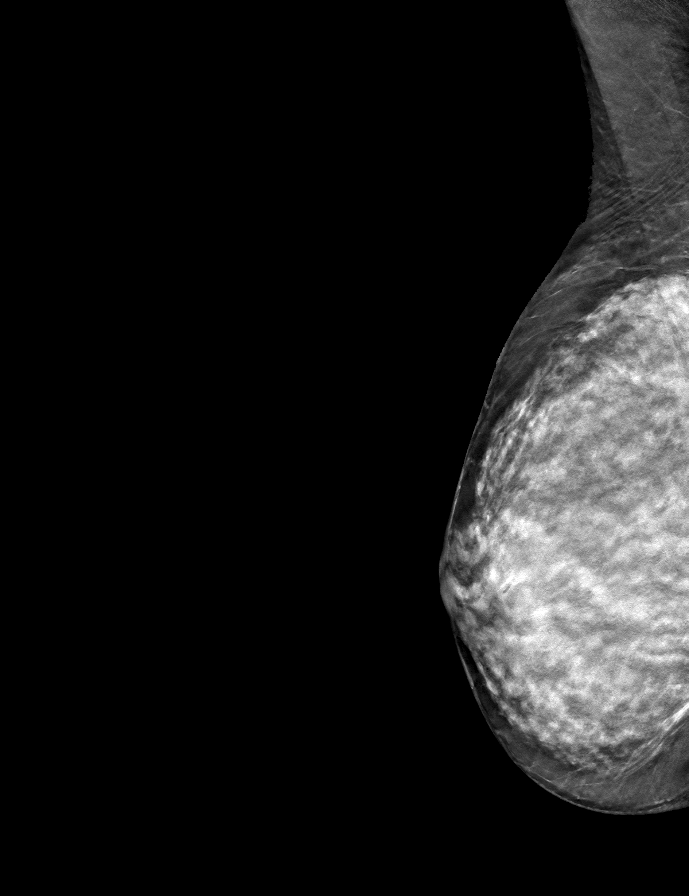
[frame 26/51]
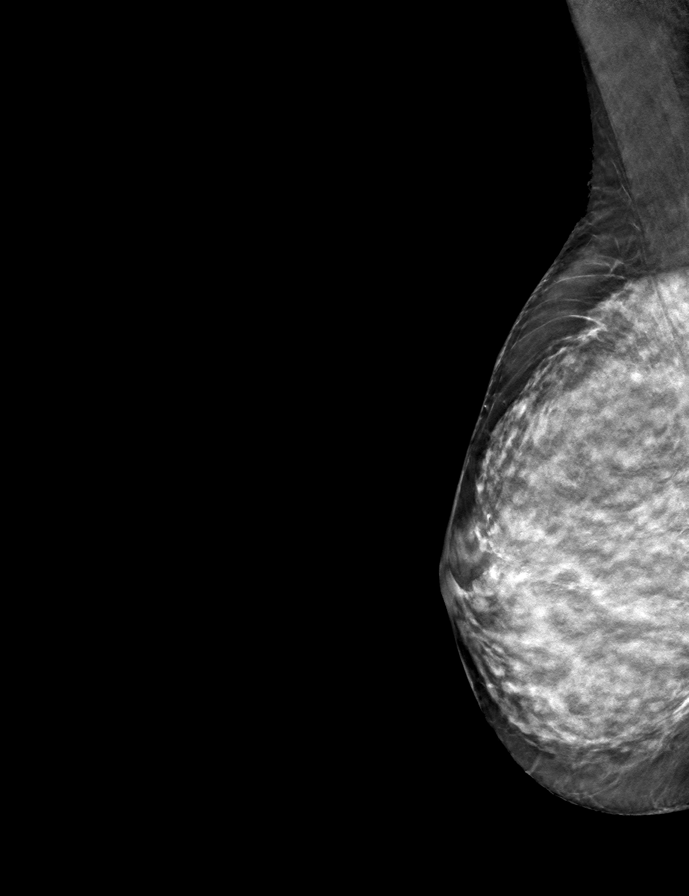

[L CC tomo · tomo slice 28/55.0]
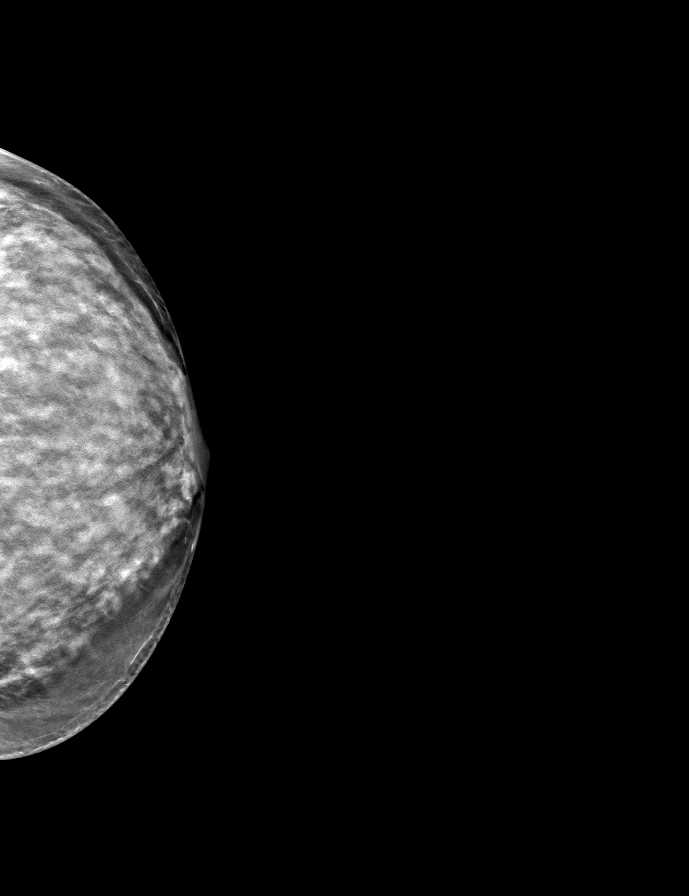

[R CC tomo · tomo slice 28/55.0]
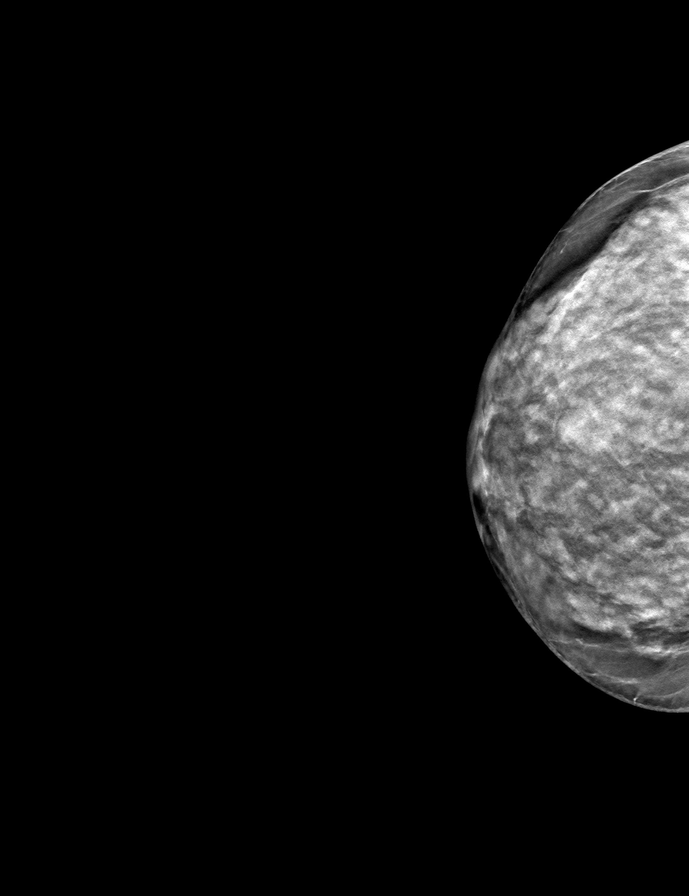

[L MLO tomo · tomo slice 28/55.0]
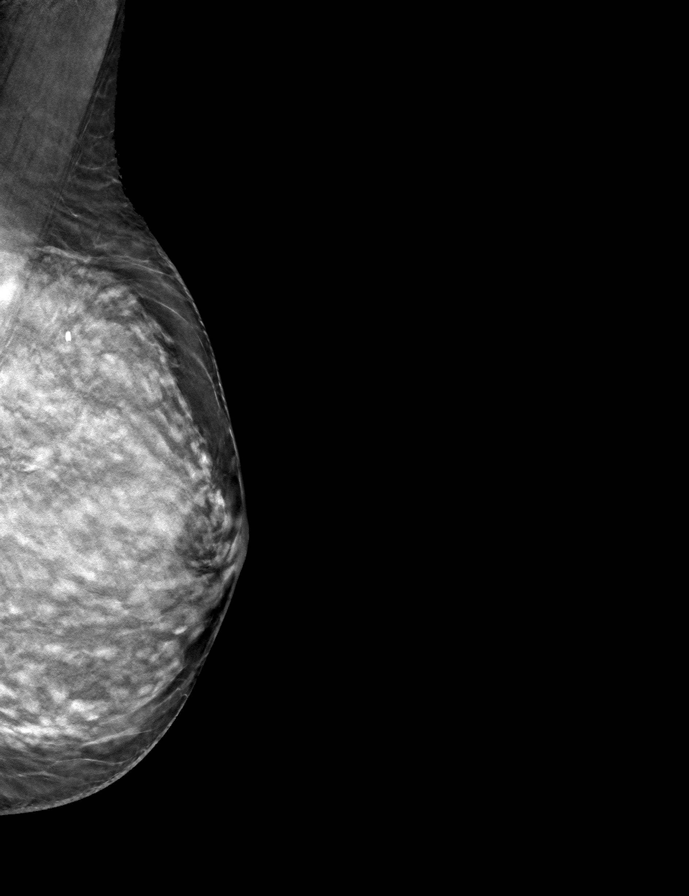

[9 of 24 positions shown; findings below may reference images not displayed]

ACR Breast Density Category d: The breast tissue is extremely dense,
which lowers the sensitivity of mammography
FINDINGS: There are no findings suspicious for malignancy.
IMPRESSION: No mammographic evidence of malignancy. A result letter of this
screening mammogram will be mailed directly to the patient.

RECOMMENDATION:
Screening mammogram in one year. (Code:[7P])

BI-RADS CATEGORY  1: Negative.

## 2020-05-08 MED FILL — LEVOTHYROXINE SODIUM 50 MCG: 50 | 90 days supply | Qty: 90 | Fill #2

## 2020-05-08 MED FILL — MELOXICAM 15 MG TABLET: 15 | 30 days supply | Qty: 30 | Fill #1

## 2020-05-08 MED FILL — METHOCARBAMOL 500 MG TABLET: 500 | 15 days supply | Qty: 30 | Fill #1

## 2020-05-12 ENCOUNTER — Ambulatory Visit (INDEPENDENT_AMBULATORY_CARE_PROVIDER_SITE_OTHER): Payer: Medicare PPO | Admitting: Psychology

## 2020-05-12 DIAGNOSIS — F411 Generalized anxiety disorder: Secondary | ICD-10-CM | POA: Diagnosis not present

## 2020-05-13 DIAGNOSIS — Z01419 Encounter for gynecological examination (general) (routine) without abnormal findings: Secondary | ICD-10-CM | POA: Diagnosis not present

## 2020-05-13 MED FILL — SERTRALINE HCL 50 MG TABLET: 50 | 90 days supply | Qty: 90 | Fill #0

## 2020-05-15 ENCOUNTER — Ambulatory Visit: Payer: Medicare PPO | Admitting: Psychology

## 2020-05-16 MED FILL — FINASTERIDE 5 MG TABLET: 5 | 30 days supply | Qty: 30 | Fill #2

## 2020-05-19 ENCOUNTER — Ambulatory Visit: Payer: Medicare PPO | Admitting: Sports Medicine

## 2020-05-19 DIAGNOSIS — J3089 Other allergic rhinitis: Secondary | ICD-10-CM | POA: Diagnosis not present

## 2020-05-19 DIAGNOSIS — Z791 Long term (current) use of non-steroidal anti-inflammatories (NSAID): Secondary | ICD-10-CM | POA: Diagnosis not present

## 2020-05-19 DIAGNOSIS — M351 Other overlap syndromes: Secondary | ICD-10-CM | POA: Diagnosis not present

## 2020-05-19 DIAGNOSIS — M797 Fibromyalgia: Secondary | ICD-10-CM | POA: Diagnosis not present

## 2020-05-19 DIAGNOSIS — J301 Allergic rhinitis due to pollen: Secondary | ICD-10-CM | POA: Diagnosis not present

## 2020-05-19 DIAGNOSIS — J3081 Allergic rhinitis due to animal (cat) (dog) hair and dander: Secondary | ICD-10-CM | POA: Diagnosis not present

## 2020-05-21 ENCOUNTER — Ambulatory Visit (INDEPENDENT_AMBULATORY_CARE_PROVIDER_SITE_OTHER): Payer: Medicare PPO | Admitting: Psychology

## 2020-05-21 ENCOUNTER — Ambulatory Visit: Payer: Medicare PPO | Admitting: Sports Medicine

## 2020-05-21 DIAGNOSIS — F411 Generalized anxiety disorder: Secondary | ICD-10-CM

## 2020-05-22 MED FILL — buPROPion HCL ER (XL) 150 M: 150 | 30 days supply | Qty: 90 | Fill #1

## 2020-05-27 ENCOUNTER — Ambulatory Visit (INDEPENDENT_AMBULATORY_CARE_PROVIDER_SITE_OTHER): Payer: Medicare PPO

## 2020-05-27 ENCOUNTER — Ambulatory Visit (INDEPENDENT_AMBULATORY_CARE_PROVIDER_SITE_OTHER): Payer: Medicare PPO | Admitting: Sports Medicine

## 2020-05-27 ENCOUNTER — Other Ambulatory Visit: Payer: Self-pay

## 2020-05-27 DIAGNOSIS — M7541 Impingement syndrome of right shoulder: Secondary | ICD-10-CM

## 2020-05-27 NOTE — Assessment & Plan Note (Signed)
This is a very pleasant 70 year old female, we saw her back in January and did a subacromial injection after failure of conservative treatment. She had fantastic improvement, unfortunately she had a fall in February and ended up with a forearm fracture. This set off her shoulder again. She got good motion today, no areas of tenderness to palpation but does have some impingement signs, repeat subacromial injection today, return to see me as needed.

## 2020-05-27 NOTE — Progress Notes (Signed)
    Procedures performed today:    Procedure: Real-time Ultrasound Guided injection of the right subacromial bursa Device: Samsung HS60  Verbal informed consent obtained.  Time-out conducted.  Noted no overlying erythema, induration, or other signs of local infection.  Skin prepped in a sterile fashion.  Local anesthesia: Topical Ethyl chloride.  With sterile technique and under real time ultrasound guidance:  Intact rotator cuff noted, 1 cc Kenalog 40, 1 cc lidocaine, 1 cc bupivacaine injected easily Completed without difficulty  Advised to call if fevers/chills, erythema, induration, drainage, or persistent bleeding.  Images permanently stored and available for review in PACS.  Impression: Technically successful ultrasound guided injection.  Independent interpretation of notes and tests performed by another provider:   None.  Brief History, Exam, Impression, and Recommendations:    Impingement syndrome, shoulder, right This is a very pleasant 70 year old female, we saw her back in January and did a subacromial injection after failure of conservative treatment. She had fantastic improvement, unfortunately she had a fall in February and ended up with a forearm fracture. This set off her shoulder again. She got good motion today, no areas of tenderness to palpation but does have some impingement signs, repeat subacromial injection today, return to see me as needed.    ___________________________________________ Gwen Her. Dianah Field, M.D., ABFM., CAQSM. Primary Care and [place] Instructor of [place] [facility]

## 2020-05-29 DIAGNOSIS — R799 Abnormal finding of blood chemistry, unspecified: Secondary | ICD-10-CM | POA: Diagnosis not present

## 2020-05-29 DIAGNOSIS — R748 Abnormal levels of other serum enzymes: Secondary | ICD-10-CM | POA: Diagnosis not present

## 2020-05-29 LAB — COMPLETE METABOLIC PANEL WITH GFR
AG Ratio: 2 (calc) (ref 1.0–2.5)
ALT: 41 U/L — ABNORMAL HIGH (ref 6–29)
AST: 29 U/L (ref 10–35)
Albumin: 4.7 g/dL (ref 3.6–5.1)
Alkaline phosphatase (APISO): 90 U/L (ref 37–153)
BUN: 25 mg/dL (ref 7–25)
CO2: 27 mmol/L (ref 20–32)
Calcium: 9.7 mg/dL (ref 8.6–10.4)
Chloride: 103 mmol/L (ref 98–110)
Creat: 0.73 mg/dL (ref 0.60–0.93)
GFR, Est African American: 96 mL/min/{1.73_m2} (ref >=60)
GFR, Est Non African American: 83 mL/min/{1.73_m2} (ref >=60)
Globulin: 2.3 g/dL (calc) (ref 1.9–3.7)
Glucose, Bld: 72 mg/dL (ref 65–99)
Potassium: 4.1 mmol/L (ref 3.5–5.3)
Sodium: 140 mmol/L (ref 135–146)
Total Bilirubin: 0.6 mg/dL (ref 0.2–1.2)
Total Protein: 7 g/dL (ref 6.1–8.1)

## 2020-05-31 DIAGNOSIS — Z20822 Contact with and (suspected) exposure to covid-19: Secondary | ICD-10-CM | POA: Diagnosis not present

## 2020-05-31 DIAGNOSIS — Z01812 Encounter for preprocedural laboratory examination: Secondary | ICD-10-CM | POA: Diagnosis not present

## 2020-06-02 ENCOUNTER — Ambulatory Visit (AMBULATORY_SURGERY_CENTER): Payer: Medicare PPO | Admitting: Gastroenterology

## 2020-06-02 ENCOUNTER — Ambulatory Visit: Payer: Medicare PPO | Admitting: Psychology

## 2020-06-02 ENCOUNTER — Encounter: Payer: Self-pay | Admitting: Gastroenterology

## 2020-06-02 ENCOUNTER — Other Ambulatory Visit: Payer: Self-pay | Admitting: Gastroenterology

## 2020-06-02 ENCOUNTER — Other Ambulatory Visit: Payer: Self-pay

## 2020-06-02 VITALS — BP 124/51 | HR 82 | Temp 97.3°F | Resp 20 | Ht 66.0 in | Wt 116.0 lb

## 2020-06-02 DIAGNOSIS — R197 Diarrhea, unspecified: Secondary | ICD-10-CM

## 2020-06-02 DIAGNOSIS — K2970 Gastritis, unspecified, without bleeding: Secondary | ICD-10-CM

## 2020-06-02 DIAGNOSIS — K921 Melena: Secondary | ICD-10-CM

## 2020-06-02 DIAGNOSIS — K317 Polyp of stomach and duodenum: Secondary | ICD-10-CM | POA: Diagnosis not present

## 2020-06-02 DIAGNOSIS — K2100 Gastro-esophageal reflux disease with esophagitis, without bleeding: Secondary | ICD-10-CM | POA: Diagnosis not present

## 2020-06-02 DIAGNOSIS — K2990 Gastroduodenitis, unspecified, without bleeding: Secondary | ICD-10-CM

## 2020-06-02 DIAGNOSIS — K2901 Acute gastritis with bleeding: Secondary | ICD-10-CM | POA: Diagnosis not present

## 2020-06-02 MED ORDER — FAMOTIDINE 20 MG PO TABS: 20.0000 mg | ORAL_TABLET | Freq: Two times a day (BID) | ORAL | 3 refills | Status: DC

## 2020-06-02 MED ORDER — SODIUM CHLORIDE 0.9 % IV SOLN: 500.0000 mL | Freq: Once | INTRAVENOUS | Status: DC

## 2020-06-02 MED FILL — FAMOTIDINE 20 MG TABS: 20 | 45 days supply | Qty: 90 | Fill #0

## 2020-06-02 NOTE — Progress Notes (Signed)
VS by NS. 

## 2020-06-02 NOTE — Progress Notes (Signed)
To PACU, VsS. Report to [email or web address]

## 2020-06-02 NOTE — Progress Notes (Signed)
Called to room to assist during endoscopic procedure.  Patient ID and intended procedure confirmed with present staff. Received instructions for my participation in the procedure from the performing physician.  

## 2020-06-02 NOTE — Op Note (Signed)
[facility] Patient Name: Robin Conrad Procedure Date: 06/02/2020 2:49 PM MRN: [id number] Endoscopist: Gerrit Heck , MD Age: 70 Referring MD:  Date of Birth: 01/25/1951 Gender: Female Account #: [id number] Procedure:                Upper GI endoscopy Indications:              Esophageal reflux, Melena, Diarrhea Medicines:                Monitored Anesthesia Care Procedure:                Pre-Anesthesia Assessment:                           - Prior to the procedure, a History and Physical                            was performed, and patient medications and                            allergies were reviewed. The patient's tolerance of                            previous anesthesia was also reviewed. The risks                            and benefits of the procedure and the sedation                            options and risks were discussed with the patient.                            All questions were answered, and informed consent                            was obtained. Prior Anticoagulants: The patient has                            taken no previous anticoagulant or antiplatelet                            agents. ASA Grade Assessment: II - A patient with                            mild systemic disease. After reviewing the risks                            and benefits, the patient was deemed in                            satisfactory condition to undergo the procedure.                           After obtaining informed consent, the endoscope was  passed under direct vision. Throughout the                            procedure, the patient's blood pressure, pulse, and                            oxygen saturations were monitored continuously. The                            Endoscope was introduced through the mouth, and                            advanced to the second part of duodenum. The upper                            GI endoscopy was  accomplished without difficulty.                            The patient tolerated the procedure well. Scope In: Scope Out: Findings:                 Very mild, LA Grade A (one or more mucosal breaks                            less than 5 mm, not extending between tops of 2                            mucosal folds) esophagitis with a single mucosal                            break in the Z line was found 40 cm from the                            incisors.                           The upper third of the esophagus and middle third                            of the esophagus were normal.                           The gastroesophageal flap valve was visualized                            endoscopically and classified as Hill Grade II                            (fold present, opens with respiration).                           A few small sessile polyps with no bleeding and no  stigmata of recent bleeding were found in the                            gastric fundus. These polyps were removed with a                            cold biopsy forceps. Resection and retrieval were                            complete. Estimated blood loss was minimal.                           Mild inflammation characterized by erosions and                            erythema was found in the gastric antrum. Biopsies                            were taken with a cold forceps for Helicobacter                            pylori testing. Estimated blood loss was minimal.                           The examined duodenum was normal. Biopsies were                            taken with a cold forceps for histology. Estimated                            blood loss was minimal. Complications:            No immediate complications. Estimated Blood Loss:     Estimated blood loss was minimal. Impression:               - LA Grade A reflux esophagitis with no bleeding.                           - Normal upper  third of esophagus and middle third                            of esophagus.                           - Gastroesophageal flap valve classified as Hill                            Grade II (fold present, opens with respiration).                           - A few gastric polyps. Resected and retrieved.                           - Gastritis. Biopsied.                           -  Normal examined duodenum. Biopsied. Recommendation:           - Patient has a contact number available for                            emergencies. The signs and symptoms of potential                            delayed complications were discussed with the                            patient. Return to normal activities tomorrow.                            Written discharge instructions were provided to the                            patient.                           - Resume previous diet.                           - Await pathology results.                           - Increase Pepcid (famotidine) to 20 mg PO BID for                            6 weeks to promote mucosal healing. If reflux well                            controlled, can reduce back to 20 mg daily. If                            symptoms not well controlled, will plan for change                            to PPI therapy.                           - Return to GI clinic in 3 months, or sooner as                            needed. Gerrit Heck, MD 06/02/2020 3:33:36 PM

## 2020-06-02 NOTE — Patient Instructions (Signed)
Handout given for Gastritis.  YOU HAD AN ENDOSCOPIC PROCEDURE TODAY AT [facility]:   Refer to the procedure report that was given to you for any specific questions about what was found during the examination.  If the procedure report does not answer your questions, please call your gastroenterologist to clarify.  If you requested that your care partner not be given the details of your procedure findings, then the procedure report has been included in a sealed envelope for you to review at your convenience later.  YOU SHOULD EXPECT: Some feelings of bloating in the abdomen. Passage of more gas than usual.  Walking can help get rid of the air that was put into your GI tract during the procedure and reduce the bloating. If you had a lower endoscopy (such as a colonoscopy or flexible sigmoidoscopy) you may notice spotting of blood in your stool or on the toilet paper. If you underwent a bowel prep for your procedure, you may not have a normal bowel movement for a few days.  Please Note:  You might notice some irritation and congestion in your nose or some drainage.  This is from the oxygen used during your procedure.  There is no need for concern and it should clear up in a day or so.  SYMPTOMS TO REPORT IMMEDIATELY:   Following upper endoscopy (EGD)  Vomiting of blood or coffee ground material  New chest pain or pain under the shoulder blades  Painful or persistently difficult swallowing  New shortness of breath  Fever of 100F or higher  Black, tarry-looking stools  For urgent or emergent issues, a gastroenterologist can be reached at any hour by calling [phone]. Do not use MyChart messaging for urgent concerns.    DIET:  We do recommend a small meal at first, but then you may proceed to your regular diet.  Drink plenty of fluids but you should avoid alcoholic beverages for 24 hours.  ACTIVITY:  You should plan to take it easy for the rest of today and you should NOT  DRIVE or use heavy machinery until tomorrow (because of the sedation medicines used during the test).    FOLLOW UP: Our staff will call the number listed on your records 48-72 hours following your procedure to check on you and address any questions or concerns that you may have regarding the information given to you following your procedure. If we do not reach you, we will leave a message.  We will attempt to reach you two times.  During this call, we will ask if you have developed any symptoms of COVID 19. If you develop any symptoms (ie: fever, flu-like symptoms, shortness of breath, cough etc.) before then, please call [phone].  If you test positive for Covid 19 in the 2 weeks post procedure, please call and report this information to [place].    If any biopsies were taken you will be contacted by phone or by letter within the next 1-3 weeks.  Please call us at [phone] if you have not heard about the biopsies in 3 weeks.    SIGNATURES/CONFIDENTIALITY: You and/or your care partner have signed paperwork which will be entered into your electronic medical record.  These signatures attest to the fact that that the information above on your After Visit Summary has been reviewed and is understood.  Full responsibility of the confidentiality of this discharge information lies with you and/or your care-partner.

## 2020-06-03 ENCOUNTER — Telehealth: Payer: Self-pay

## 2020-06-03 DIAGNOSIS — J45909 Unspecified asthma, uncomplicated: Secondary | ICD-10-CM | POA: Diagnosis not present

## 2020-06-03 DIAGNOSIS — N3941 Urge incontinence: Secondary | ICD-10-CM | POA: Insufficient documentation

## 2020-06-03 DIAGNOSIS — Z4682 Encounter for fitting and adjustment of non-vascular catheter: Secondary | ICD-10-CM | POA: Diagnosis not present

## 2020-06-03 DIAGNOSIS — Z9049 Acquired absence of other specified parts of digestive tract: Secondary | ICD-10-CM | POA: Diagnosis not present

## 2020-06-03 DIAGNOSIS — M797 Fibromyalgia: Secondary | ICD-10-CM | POA: Diagnosis not present

## 2020-06-03 DIAGNOSIS — N3946 Mixed incontinence: Secondary | ICD-10-CM | POA: Diagnosis not present

## 2020-06-03 DIAGNOSIS — M1990 Unspecified osteoarthritis, unspecified site: Secondary | ICD-10-CM | POA: Diagnosis not present

## 2020-06-03 DIAGNOSIS — R159 Full incontinence of feces: Secondary | ICD-10-CM | POA: Diagnosis not present

## 2020-06-03 DIAGNOSIS — K219 Gastro-esophageal reflux disease without esophagitis: Secondary | ICD-10-CM | POA: Diagnosis not present

## 2020-06-03 DIAGNOSIS — F419 Anxiety disorder, unspecified: Secondary | ICD-10-CM | POA: Diagnosis not present

## 2020-06-03 DIAGNOSIS — E039 Hypothyroidism, unspecified: Secondary | ICD-10-CM | POA: Diagnosis not present

## 2020-06-03 DIAGNOSIS — R32 Unspecified urinary incontinence: Secondary | ICD-10-CM | POA: Diagnosis not present

## 2020-06-03 NOTE — Telephone Encounter (Signed)
Per 06/02/20 procedure note - Return to GI clinic in 3 months, or sooner as needed.   Patient has been scheduled for a 3 month follow up on Wednesday, 08/27/20 at 10:20 AM. Sent via My Chart and mailed to patient.

## 2020-06-04 ENCOUNTER — Other Ambulatory Visit: Payer: Self-pay | Admitting: Family Medicine

## 2020-06-04 ENCOUNTER — Telehealth: Payer: Self-pay | Admitting: *Deleted

## 2020-06-04 MED FILL — MELOXICAM 15 MG TABLET: 15 | 30 days supply | Qty: 30 | Fill #0

## 2020-06-04 MED FILL — METHOCARBAMOL 500 MG TABLET: 500 | 15 days supply | Qty: 30 | Fill #0

## 2020-06-04 MED FILL — traMADol HCL 50 MG TABS: 50 | 10 days supply | Qty: 60 | Fill #1

## 2020-06-04 NOTE — Telephone Encounter (Signed)
  Follow up Call-  Call back number 06/02/2020  Post procedure Call Back phone  # [phone]  Permission to leave phone message Yes  Some recent data might be hidden     Patient questions:  Do you have a fever, pain , or abdominal swelling? No. Pain Score  0 *  Have you tolerated food without any problems? Yes.    Have you been able to return to your normal activities?yes  Do you have any questions about your discharge instructions: Diet   No. Medications  No. Follow up visit  No.  Do you have questions or concerns about your Care? No.  Actions: * If pain score is 4 or above: 1. No action needed, pain <4.Have you developed a fever since your procedure? no  2.   Have you had an respiratory symptoms (SOB or cough) since your procedure? no  3.   Have you tested positive for COVID 19 since your procedure no  4.   Have you had any family members/close contacts diagnosed with the COVID 19 since your procedure?  no   If yes to any of these questions please route to Joylene John, RN and Joella Prince, RN

## 2020-06-06 ENCOUNTER — Ambulatory Visit: Payer: Medicare PPO | Admitting: Psychology

## 2020-06-06 ENCOUNTER — Ambulatory Visit (INDEPENDENT_AMBULATORY_CARE_PROVIDER_SITE_OTHER): Payer: Medicare PPO | Admitting: Psychology

## 2020-06-06 DIAGNOSIS — F411 Generalized anxiety disorder: Secondary | ICD-10-CM

## 2020-06-09 DIAGNOSIS — J301 Allergic rhinitis due to pollen: Secondary | ICD-10-CM | POA: Diagnosis not present

## 2020-06-09 DIAGNOSIS — J3081 Allergic rhinitis due to animal (cat) (dog) hair and dander: Secondary | ICD-10-CM | POA: Diagnosis not present

## 2020-06-09 DIAGNOSIS — J3089 Other allergic rhinitis: Secondary | ICD-10-CM | POA: Diagnosis not present

## 2020-06-12 ENCOUNTER — Encounter: Payer: Self-pay | Admitting: Gastroenterology

## 2020-06-12 ENCOUNTER — Encounter: Payer: Self-pay | Admitting: Family Medicine

## 2020-06-12 MED FILL — GABAPENTIN 600 MG TABLET: 600 | 30 days supply | Qty: 135 | Fill #1

## 2020-06-12 NOTE — Telephone Encounter (Signed)
Prior Authorization approved. Sharri scheduled. CMP within 30 day.

## 2020-06-12 NOTE — Telephone Encounter (Signed)
Ok to change to [employer]. We will have to get her approved.

## 2020-06-17 ENCOUNTER — Other Ambulatory Visit: Payer: Self-pay

## 2020-06-17 ENCOUNTER — Ambulatory Visit (INDEPENDENT_AMBULATORY_CARE_PROVIDER_SITE_OTHER): Payer: Medicare PPO | Admitting: Family Medicine

## 2020-06-17 VITALS — BP 119/50 | HR 93

## 2020-06-17 DIAGNOSIS — M810 Age-related osteoporosis without current pathological fracture: Secondary | ICD-10-CM | POA: Diagnosis not present

## 2020-06-17 MED ORDER — DENOSUMAB 60 MG/ML ~~LOC~~ SOSY
60.0000 mg | PREFILLED_SYRINGE | Freq: Once | SUBCUTANEOUS | Status: AC
  Administered 2020-06-17: 60 mg via SUBCUTANEOUS

## 2020-06-17 NOTE — Progress Notes (Signed)
First Prolia injection given in right arm today without immediate complications.  Advised pt to call the office when it's time to schedule her next one in 6 months.

## 2020-06-17 NOTE — Progress Notes (Signed)
Agree with documentation as above.   Onell Mcmath, MD  

## 2020-06-19 ENCOUNTER — Other Ambulatory Visit (HOSPITAL_BASED_OUTPATIENT_CLINIC_OR_DEPARTMENT_OTHER): Payer: Self-pay

## 2020-06-19 MED FILL — Finasteride Tab 5 MG: ORAL | 30 days supply | Qty: 30 | Fill #0 | Status: AC

## 2020-06-24 ENCOUNTER — Other Ambulatory Visit (HOSPITAL_BASED_OUTPATIENT_CLINIC_OR_DEPARTMENT_OTHER): Payer: Self-pay

## 2020-06-24 ENCOUNTER — Other Ambulatory Visit: Payer: Self-pay | Admitting: Family Medicine

## 2020-06-24 DIAGNOSIS — J301 Allergic rhinitis due to pollen: Secondary | ICD-10-CM | POA: Diagnosis not present

## 2020-06-24 DIAGNOSIS — J3081 Allergic rhinitis due to animal (cat) (dog) hair and dander: Secondary | ICD-10-CM | POA: Diagnosis not present

## 2020-06-24 DIAGNOSIS — J3089 Other allergic rhinitis: Secondary | ICD-10-CM | POA: Diagnosis not present

## 2020-06-24 MED ORDER — BUPROPION HCL ER (XL) 150 MG PO TB24
ORAL_TABLET | Freq: Every morning | ORAL | 1 refills | Status: DC
  Filled 2020-06-24: qty 90, 30d supply, fill #0
  Filled 2020-07-23: qty 90, 30d supply, fill #1

## 2020-06-25 ENCOUNTER — Ambulatory Visit: Payer: Medicare PPO | Admitting: Psychology

## 2020-07-02 ENCOUNTER — Encounter: Payer: Self-pay | Admitting: Family Medicine

## 2020-07-03 ENCOUNTER — Ambulatory Visit (INDEPENDENT_AMBULATORY_CARE_PROVIDER_SITE_OTHER): Payer: Medicare PPO | Admitting: Sports Medicine

## 2020-07-03 ENCOUNTER — Ambulatory Visit (INDEPENDENT_AMBULATORY_CARE_PROVIDER_SITE_OTHER): Payer: Medicare PPO | Admitting: Psychology

## 2020-07-03 ENCOUNTER — Other Ambulatory Visit (HOSPITAL_BASED_OUTPATIENT_CLINIC_OR_DEPARTMENT_OTHER): Payer: Self-pay

## 2020-07-03 DIAGNOSIS — R300 Dysuria: Secondary | ICD-10-CM | POA: Diagnosis not present

## 2020-07-03 DIAGNOSIS — N3000 Acute cystitis without hematuria: Secondary | ICD-10-CM | POA: Insufficient documentation

## 2020-07-03 DIAGNOSIS — N3001 Acute cystitis with hematuria: Secondary | ICD-10-CM | POA: Diagnosis not present

## 2020-07-03 DIAGNOSIS — N3090 Cystitis, unspecified without hematuria: Secondary | ICD-10-CM | POA: Insufficient documentation

## 2020-07-03 DIAGNOSIS — R42 Dizziness and giddiness: Secondary | ICD-10-CM

## 2020-07-03 DIAGNOSIS — F411 Generalized anxiety disorder: Secondary | ICD-10-CM | POA: Diagnosis not present

## 2020-07-03 DIAGNOSIS — H8110 Benign paroxysmal vertigo, unspecified ear: Secondary | ICD-10-CM | POA: Insufficient documentation

## 2020-07-03 LAB — POCT URINALYSIS DIP (CLINITEK)
Bilirubin, UA: NEGATIVE
Glucose, UA: NEGATIVE mg/dL
Ketones, POC UA: NEGATIVE mg/dL
Nitrite, UA: POSITIVE — AB
POC PROTEIN,UA: 30 — AB
Spec Grav, UA: >=1.03 — AB (ref 1.010–1.025)
Urobilinogen, UA: 0.2 E.U./dL
pH, UA: 6.5 (ref 5.0–8.0)

## 2020-07-03 MED ORDER — PHENAZOPYRIDINE HCL 200 MG PO TABS
200.0000 mg | ORAL_TABLET | Freq: Three times a day (TID) | ORAL | 0 refills | Status: AC
  Filled 2020-07-03: qty 6, 2d supply, fill #0

## 2020-07-03 MED ORDER — NITROFURANTOIN MONOHYD MACRO 100 MG PO CAPS
100.0000 mg | ORAL_CAPSULE | Freq: Two times a day (BID) | ORAL | 0 refills | Status: DC
Start: 2020-07-03 — End: 2020-08-04
  Filled 2020-07-03: qty 14, 7d supply, fill #0

## 2020-07-03 NOTE — Assessment & Plan Note (Signed)
Robin Conrad is a very pleasant 70 year old female, she has had several days of symptoms consistent with acute cystitis, burning, urgency, frequency, hesitancy. Urinalysis is significantly positive with nitrites, leukocytes, blood. She likely has an E. coli UTI, unfortunately we do not have any prior urinary cultures to guide treatment, Macrobid has good sensitivities this year so we will add this, Pyridium. Return in about a month for repeat urinalysis to ensure clearance of hematuria.

## 2020-07-03 NOTE — Progress Notes (Signed)
    Procedures performed today:    None.  Independent interpretation of notes and tests performed by another provider:   None.  Brief History, Exam, Impression, and Recommendations:    Acute cystitis Robin Conrad is a very pleasant 70 year old female, she has had several days of symptoms consistent with acute cystitis, burning, urgency, frequency, hesitancy. Urinalysis is significantly positive with nitrites, leukocytes, blood. She likely has an E. coli UTI, unfortunately we do not have any prior urinary cultures to guide treatment, Macrobid has good sensitivities this year so we will add this, Pyridium. Return in about a month for repeat urinalysis to ensure clearance of hematuria.  Vertigo Robin Conrad has been complaining of increasing sensation of being off balance with changing the position of her head, consistent with benign paroxysmal positional vertigo. She did have some cerumen in both canals, nonocclusive, she has had her ears cleaned in the past and did not notice any improvement in symptoms so we will hold off on irrigation today. We will start with some vestibular physical therapy, however because she also has significant tinnitus this raises the concern for Mnire's disease, so she does not respond to vestibular rehab we will initiate treatment for Mnire's disease.    ___________________________________________ Gwen Her. Dianah Field, M.D., ABFM., CAQSM. Primary Care and [place] Instructor of [place] [facility]

## 2020-07-03 NOTE — Assessment & Plan Note (Signed)
Robin Conrad has been complaining of increasing sensation of being off balance with changing the position of her head, consistent with benign paroxysmal positional vertigo. She did have some cerumen in both canals, nonocclusive, she has had her ears cleaned in the past and did not notice any improvement in symptoms so we will hold off on irrigation today. We will start with some vestibular physical therapy, however because she also has significant tinnitus this raises the concern for Mnire's disease, so she does not respond to vestibular rehab we will initiate treatment for Mnire's disease.

## 2020-07-03 NOTE — Telephone Encounter (Signed)
LVM for patient to call back and get appt scheduled. AM

## 2020-07-06 LAB — URINE CULTURE
MICRO NUMBER:: 11800915
SPECIMEN QUALITY:: ADEQUATE

## 2020-07-08 ENCOUNTER — Other Ambulatory Visit (HOSPITAL_BASED_OUTPATIENT_CLINIC_OR_DEPARTMENT_OTHER): Payer: Self-pay

## 2020-07-08 DIAGNOSIS — J3089 Other allergic rhinitis: Secondary | ICD-10-CM | POA: Diagnosis not present

## 2020-07-08 DIAGNOSIS — J301 Allergic rhinitis due to pollen: Secondary | ICD-10-CM | POA: Diagnosis not present

## 2020-07-08 DIAGNOSIS — J3081 Allergic rhinitis due to animal (cat) (dog) hair and dander: Secondary | ICD-10-CM | POA: Diagnosis not present

## 2020-07-08 MED FILL — Tramadol HCl Tab 50 MG: ORAL | 10 days supply | Qty: 60 | Fill #0 | Status: AC

## 2020-07-08 MED FILL — Meloxicam Tab 15 MG: ORAL | 30 days supply | Qty: 30 | Fill #0 | Status: AC

## 2020-07-08 MED FILL — Methocarbamol Tab 500 MG: ORAL | 15 days supply | Qty: 30 | Fill #0 | Status: AC

## 2020-07-11 ENCOUNTER — Other Ambulatory Visit: Payer: Self-pay

## 2020-07-11 ENCOUNTER — Encounter: Payer: Self-pay | Admitting: Rehabilitative and Restorative Service Providers"

## 2020-07-11 ENCOUNTER — Ambulatory Visit (INDEPENDENT_AMBULATORY_CARE_PROVIDER_SITE_OTHER): Payer: Medicare PPO | Admitting: Rehabilitative and Restorative Service Providers"

## 2020-07-11 DIAGNOSIS — R29818 Other symptoms and signs involving the nervous system: Secondary | ICD-10-CM | POA: Diagnosis not present

## 2020-07-11 DIAGNOSIS — R42 Dizziness and giddiness: Secondary | ICD-10-CM

## 2020-07-11 DIAGNOSIS — R2681 Unsteadiness on feet: Secondary | ICD-10-CM | POA: Diagnosis not present

## 2020-07-11 NOTE — Patient Instructions (Signed)
Access Code: [email or web address] URL: [email or web address] Date: 07/11/2020 Prepared by: Rudell Cobb  Exercises Seated Gaze Stabilization with Head Rotation - 2 x daily - 7 x weekly - 1 sets - 20 reps Feet Together Balance with Head Rotation - 2 x daily - 7 x weekly - 1 sets - 5 reps Standing Single Leg Stance with Counter Support - 2 x daily - 7 x weekly - 1 sets - 3 reps - 10-15 seconds hold
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
## 2020-07-14 ENCOUNTER — Ambulatory Visit: Payer: Medicare PPO | Admitting: Psychology

## 2020-07-15 ENCOUNTER — Telehealth: Payer: Self-pay | Admitting: *Deleted

## 2020-07-15 DIAGNOSIS — M2021 Hallux rigidus, right foot: Secondary | ICD-10-CM

## 2020-07-15 DIAGNOSIS — B351 Tinea unguium: Secondary | ICD-10-CM

## 2020-07-15 NOTE — Telephone Encounter (Signed)
If it has been 6 months since her last COVID booster then I would recommend that she consider getting it.  Hopefully by this fall though have an updated version.  But for now I think it would add some additional protection over the next 6 months.

## 2020-07-15 NOTE — Telephone Encounter (Signed)
Pt would like a referral for podiatry. Referral placed.   Pt wanted to know if Dr. Madilyn Fireman thoughts about whether she should take the 2nd COVID booster?   Pt stated that when she was seen by Dr. Darene Lamer for a UTI and she mentioned to him that she was experiencing some tinnitus, vertigo. He suggested that she get in with PT for this and f/u with either him or her for a recheck of her urine. She already had an appt on 5/23 with Dr. Madilyn Fireman will cancel the 5/19 appt.

## 2020-07-16 ENCOUNTER — Ambulatory Visit (INDEPENDENT_AMBULATORY_CARE_PROVIDER_SITE_OTHER): Payer: Medicare PPO | Admitting: Rehabilitative and Restorative Service Providers"

## 2020-07-16 ENCOUNTER — Other Ambulatory Visit: Payer: Self-pay

## 2020-07-16 DIAGNOSIS — R29818 Other symptoms and signs involving the nervous system: Secondary | ICD-10-CM

## 2020-07-16 DIAGNOSIS — R42 Dizziness and giddiness: Secondary | ICD-10-CM | POA: Diagnosis not present

## 2020-07-16 DIAGNOSIS — R2681 Unsteadiness on feet: Secondary | ICD-10-CM | POA: Diagnosis not present
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
## 2020-07-16 NOTE — Patient Instructions (Signed)
Access Code: [email or web address] URL: [email or web address] Date: 07/16/2020 Prepared by: Rudell Cobb  Exercises Squat with Chair Touch - 2 x daily - 7 x weekly - 1 sets - 10 reps Standing Gaze Stabilization with Head Rotation - 2 x daily - 7 x weekly - 1 sets - 25 reps Feet Together Balance with Head Rotation - 2 x daily - 7 x weekly - 1 sets - 5 reps Standing on foam pad - 2 x daily - 7 x weekly - 1 sets - 3 reps - 30 seconds hold Standing Single Leg Stance with Counter Support - 2 x daily - 7 x weekly - 1 sets - 3 reps - 10-15 seconds hold Heel rises with counter support - 2 x daily - 7 x weekly - 1 sets - 10-20 reps

## 2020-07-17 ENCOUNTER — Other Ambulatory Visit (HOSPITAL_BASED_OUTPATIENT_CLINIC_OR_DEPARTMENT_OTHER): Payer: Self-pay

## 2020-07-17 MED FILL — Famotidine Tab 20 MG: ORAL | 90 days supply | Qty: 90 | Fill #0 | Status: AC

## 2020-07-17 MED FILL — Gabapentin Tab 600 MG: ORAL | 30 days supply | Qty: 135 | Fill #0 | Status: AC

## 2020-07-17 MED FILL — Finasteride Tab 5 MG: ORAL | 30 days supply | Qty: 30 | Fill #1 | Status: AC

## 2020-07-17 NOTE — Telephone Encounter (Signed)
Left message advising of recommendations.  

## 2020-07-18 ENCOUNTER — Other Ambulatory Visit: Payer: Self-pay

## 2020-07-18 ENCOUNTER — Ambulatory Visit (INDEPENDENT_AMBULATORY_CARE_PROVIDER_SITE_OTHER): Payer: Medicare PPO | Admitting: Podiatry

## 2020-07-18 ENCOUNTER — Encounter: Payer: Self-pay | Admitting: Podiatry

## 2020-07-18 DIAGNOSIS — M2042 Other hammer toe(s) (acquired), left foot: Secondary | ICD-10-CM

## 2020-07-18 DIAGNOSIS — M2041 Other hammer toe(s) (acquired), right foot: Secondary | ICD-10-CM

## 2020-07-18 DIAGNOSIS — B351 Tinea unguium: Secondary | ICD-10-CM
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
## 2020-07-21 DIAGNOSIS — Z791 Long term (current) use of non-steroidal anti-inflammatories (NSAID): Secondary | ICD-10-CM | POA: Diagnosis not present

## 2020-07-21 DIAGNOSIS — M797 Fibromyalgia: Secondary | ICD-10-CM | POA: Diagnosis not present

## 2020-07-21 DIAGNOSIS — M351 Other overlap syndromes: Secondary | ICD-10-CM | POA: Diagnosis not present

## 2020-07-22 DIAGNOSIS — B351 Tinea unguium: Secondary | ICD-10-CM | POA: Diagnosis not present

## 2020-07-22 DIAGNOSIS — J3081 Allergic rhinitis due to animal (cat) (dog) hair and dander: Secondary | ICD-10-CM | POA: Diagnosis not present

## 2020-07-22 DIAGNOSIS — J3089 Other allergic rhinitis: Secondary | ICD-10-CM | POA: Diagnosis not present

## 2020-07-22 DIAGNOSIS — L601 Onycholysis: Secondary | ICD-10-CM | POA: Diagnosis not present

## 2020-07-22 DIAGNOSIS — L608 Other nail disorders: Secondary | ICD-10-CM | POA: Diagnosis not present

## 2020-07-22 DIAGNOSIS — J301 Allergic rhinitis due to pollen: Secondary | ICD-10-CM | POA: Diagnosis not present

## 2020-07-23 ENCOUNTER — Ambulatory Visit: Payer: Medicare PPO | Attending: Internal Medicine

## 2020-07-23 ENCOUNTER — Other Ambulatory Visit (HOSPITAL_BASED_OUTPATIENT_CLINIC_OR_DEPARTMENT_OTHER): Payer: Self-pay

## 2020-07-23 DIAGNOSIS — Z23 Encounter for immunization: Secondary | ICD-10-CM

## 2020-07-23 MED ORDER — CARESTART COVID-19 HOME TEST VI KIT
PACK | 0 refills | Status: DC
  Filled 2020-07-23: qty 2, 4d supply, fill #0

## 2020-07-23 NOTE — Progress Notes (Signed)
   Covid-19 Vaccination Clinic  Name:  Robin Conrad    MRN: [id number] DOB: 1950-12-09  07/23/2020  [name] was observed post Covid-19 immunization for 15 minutes without incident. She was provided with Vaccine Information Sheet and instruction to access the V-Safe system.   [name] was instructed to call 911 with any severe reactions post vaccine: Marland Kitchen Difficulty breathing  . Swelling of face and throat  . A fast heartbeat  . A bad rash all over body  . Dizziness and weakness   Immunizations Administered    Name Date Dose VIS Date Route   Moderna Covid-19 Booster Vaccine 07/23/2020 10:54 AM 0.25 mL 01/02/2020 Intramuscular   Manufacturer: Moderna   Lot: [id number]   [place]: [id number]

## 2020-07-25 ENCOUNTER — Ambulatory Visit (INDEPENDENT_AMBULATORY_CARE_PROVIDER_SITE_OTHER): Payer: Medicare PPO | Admitting: Rehabilitative and Restorative Service Providers"

## 2020-07-25 ENCOUNTER — Other Ambulatory Visit: Payer: Self-pay

## 2020-07-25 DIAGNOSIS — R2681 Unsteadiness on feet: Secondary | ICD-10-CM | POA: Diagnosis not present

## 2020-07-25 DIAGNOSIS — R42 Dizziness and giddiness: Secondary | ICD-10-CM | POA: Diagnosis not present

## 2020-07-25 DIAGNOSIS — R29818 Other symptoms and signs involving the nervous system: Secondary | ICD-10-CM

## 2020-07-25 NOTE — Patient Instructions (Signed)
Access Code: [email or web address] URL: [email or web address] Date: 07/25/2020 Prepared by: Rudell Cobb  Exercises Squat with Chair Touch - 2 x daily - 7 x weekly - 1 sets - 10 reps Standing Gaze Stabilization with Head Rotation - 2 x daily - 7 x weekly - 1 sets - 25 reps Feet Together Balance with Head Rotation - 2 x daily - 7 x weekly - 1 sets - 5 reps Standing Single Leg Stance with Counter Support - 2 x daily - 7 x weekly - 1 sets - 3 reps - 10-15 seconds hold Heel rises with counter support - 2 x daily - 7 x weekly - 1 sets - 10-20 reps
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
## 2020-07-28 ENCOUNTER — Other Ambulatory Visit (HOSPITAL_BASED_OUTPATIENT_CLINIC_OR_DEPARTMENT_OTHER): Payer: Self-pay

## 2020-07-28 DIAGNOSIS — M797 Fibromyalgia: Secondary | ICD-10-CM | POA: Diagnosis not present

## 2020-07-28 MED ORDER — MODERNA COVID-19 VACCINE 100 MCG/0.5ML IM SUSP
INTRAMUSCULAR | 0 refills | Status: DC
  Filled 2020-07-28: qty 0.25, 1d supply, fill #0

## 2020-07-30 ENCOUNTER — Ambulatory Visit: Payer: Medicare PPO | Admitting: Psychology

## 2020-07-31 ENCOUNTER — Ambulatory Visit: Payer: Medicare PPO | Admitting: Family Medicine

## 2020-08-01 ENCOUNTER — Ambulatory Visit (INDEPENDENT_AMBULATORY_CARE_PROVIDER_SITE_OTHER): Payer: Medicare PPO | Admitting: Rehabilitative and Restorative Service Providers"

## 2020-08-01 ENCOUNTER — Other Ambulatory Visit: Payer: Self-pay

## 2020-08-01 DIAGNOSIS — R2681 Unsteadiness on feet: Secondary | ICD-10-CM | POA: Diagnosis not present

## 2020-08-01 DIAGNOSIS — R29818 Other symptoms and signs involving the nervous system: Secondary | ICD-10-CM | POA: Diagnosis not present

## 2020-08-01 DIAGNOSIS — R42 Dizziness and giddiness: Secondary | ICD-10-CM
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
## 2020-08-01 NOTE — Patient Instructions (Signed)
Access Code: [email or web address] URL: [email or web address] Date: 08/01/2020 Prepared by: Rudell Cobb  Exercises Squat with Chair Touch - 1 x daily - 7 x weekly - 1 sets - 10 reps Standing Gaze Stabilization with Head Rotation - 2 x daily - 7 x weekly - 1 sets - 25 reps Feet Together Balance with Head Rotation - 2 x daily - 7 x weekly - 1 sets - 5 reps Standing Single Leg Stance with Counter Support - 2 x daily - 7 x weekly - 1 sets - 3 reps - 10-15 seconds hold Heel rises with counter support - 1 x daily - 7 x weekly - 1 sets - 10-20 reps Side Stepping with Resistance at Thighs - 1 x daily - 7 x weekly - 1 sets - 10 reps

## 2020-08-04 ENCOUNTER — Other Ambulatory Visit: Payer: Self-pay

## 2020-08-04 ENCOUNTER — Other Ambulatory Visit (HOSPITAL_BASED_OUTPATIENT_CLINIC_OR_DEPARTMENT_OTHER): Payer: Self-pay

## 2020-08-04 ENCOUNTER — Encounter: Payer: Self-pay | Admitting: Family Medicine

## 2020-08-04 ENCOUNTER — Ambulatory Visit (INDEPENDENT_AMBULATORY_CARE_PROVIDER_SITE_OTHER): Payer: Medicare PPO | Admitting: Family Medicine

## 2020-08-04 VITALS — BP 100/59 | HR 85 | Ht 66.0 in | Wt 120.0 lb

## 2020-08-04 DIAGNOSIS — E039 Hypothyroidism, unspecified: Secondary | ICD-10-CM | POA: Diagnosis not present

## 2020-08-04 DIAGNOSIS — R748 Abnormal levels of other serum enzymes: Secondary | ICD-10-CM | POA: Diagnosis not present

## 2020-08-04 DIAGNOSIS — M359 Systemic involvement of connective tissue, unspecified: Secondary | ICD-10-CM

## 2020-08-04 DIAGNOSIS — M797 Fibromyalgia: Secondary | ICD-10-CM

## 2020-08-04 DIAGNOSIS — D692 Other nonthrombocytopenic purpura: Secondary | ICD-10-CM

## 2020-08-04 DIAGNOSIS — H8110 Benign paroxysmal vertigo, unspecified ear: Secondary | ICD-10-CM | POA: Diagnosis not present

## 2020-08-04 DIAGNOSIS — F411 Generalized anxiety disorder: Secondary | ICD-10-CM | POA: Diagnosis not present

## 2020-08-04 MED ORDER — SERTRALINE HCL 50 MG PO TABS
ORAL_TABLET | Freq: Every day | ORAL | 1 refills | Status: DC
  Filled 2020-08-04: qty 90, 90d supply, fill #0
  Filled 2020-11-06: qty 90, 90d supply, fill #1

## 2020-08-04 MED ORDER — METHOCARBAMOL 500 MG PO TABS
ORAL_TABLET | ORAL | 1 refills | Status: DC
  Filled 2020-08-04: qty 30, 15d supply, fill #0
  Filled 2020-09-04: qty 30, 15d supply, fill #1

## 2020-08-04 MED ORDER — TRAMADOL HCL 50 MG PO TABS
ORAL_TABLET | ORAL | 2 refills | Status: DC
  Filled 2020-08-04: qty 60, 10d supply, fill #0
  Filled 2020-09-04: qty 60, 10d supply, fill #1
  Filled 2020-10-10: qty 60, 10d supply, fill #2

## 2020-08-04 MED ORDER — LEVOTHYROXINE SODIUM 50 MCG PO TABS
ORAL_TABLET | Freq: Every day | ORAL | 2 refills | Status: DC
  Filled 2020-08-04: qty 90, 90d supply, fill #0
  Filled 2020-11-06: qty 90, 90d supply, fill #1
  Filled 2021-02-10: qty 90, 90d supply, fill #2

## 2020-08-04 NOTE — Assessment & Plan Note (Signed)
Currently follows with rheumatology but feels like she has been fairly stable and happy to take over the meloxicam if she would like we will continue to monitor renal function twice a year.  She may want to stay with them for at least a yearly visit for maintenance.

## 2020-08-04 NOTE — Assessment & Plan Note (Signed)
Unfortunately the sertraline is still causing excess sedation.  She did move it to bedtime and says it still makes her feel tired when she gets up in the morning.  We discussed going to a half a tab daily to see if that is effective and helps with sedation or we could even consider switching the medication for now she is can to try half of a tab and let me know how that goes.

## 2020-08-04 NOTE — Assessment & Plan Note (Signed)
Filled the methocarbamol and tramadol.  Also uses meloxicam regularly and as needed Tylenol.

## 2020-08-04 NOTE — Assessment & Plan Note (Signed)
Formal vestibular rehab does seem to be helpful continue with rehab.  Hopefully will continue to improve if not consider referral to ENT for further evaluation
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
## 2020-08-04 NOTE — Assessment & Plan Note (Signed)
Due to recheck TSH. 

## 2020-08-04 NOTE — Assessment & Plan Note (Signed)
Stable

## 2020-08-05 DIAGNOSIS — J3089 Other allergic rhinitis: Secondary | ICD-10-CM | POA: Diagnosis not present

## 2020-08-05 DIAGNOSIS — J301 Allergic rhinitis due to pollen: Secondary | ICD-10-CM | POA: Diagnosis not present

## 2020-08-05 DIAGNOSIS — J3081 Allergic rhinitis due to animal (cat) (dog) hair and dander: Secondary | ICD-10-CM | POA: Diagnosis not present

## 2020-08-06 ENCOUNTER — Other Ambulatory Visit (HOSPITAL_BASED_OUTPATIENT_CLINIC_OR_DEPARTMENT_OTHER): Payer: Self-pay

## 2020-08-06 MED ORDER — MELOXICAM 15 MG PO TABS
ORAL_TABLET | Freq: Every day | ORAL | 1 refills | Status: DC
  Filled 2020-08-06: qty 30, 30d supply, fill #0
  Filled 2020-09-04: qty 30, 30d supply, fill #1

## 2020-08-08 ENCOUNTER — Other Ambulatory Visit: Payer: Self-pay

## 2020-08-08 ENCOUNTER — Ambulatory Visit (INDEPENDENT_AMBULATORY_CARE_PROVIDER_SITE_OTHER): Payer: Medicare PPO | Admitting: Rehabilitative and Restorative Service Providers"

## 2020-08-08 DIAGNOSIS — R29818 Other symptoms and signs involving the nervous system: Secondary | ICD-10-CM

## 2020-08-08 DIAGNOSIS — R2681 Unsteadiness on feet: Secondary | ICD-10-CM | POA: Diagnosis not present

## 2020-08-08 DIAGNOSIS — R42 Dizziness and giddiness: Secondary | ICD-10-CM
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
## 2020-08-15 ENCOUNTER — Other Ambulatory Visit: Payer: Self-pay

## 2020-08-15 ENCOUNTER — Ambulatory Visit (INDEPENDENT_AMBULATORY_CARE_PROVIDER_SITE_OTHER): Payer: Medicare PPO | Admitting: Rehabilitative and Restorative Service Providers"

## 2020-08-15 DIAGNOSIS — R29818 Other symptoms and signs involving the nervous system: Secondary | ICD-10-CM | POA: Diagnosis not present

## 2020-08-15 DIAGNOSIS — R2681 Unsteadiness on feet: Secondary | ICD-10-CM | POA: Diagnosis not present

## 2020-08-15 DIAGNOSIS — R42 Dizziness and giddiness: Secondary | ICD-10-CM
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
## 2020-08-19 DIAGNOSIS — J301 Allergic rhinitis due to pollen: Secondary | ICD-10-CM | POA: Diagnosis not present

## 2020-08-19 DIAGNOSIS — J3081 Allergic rhinitis due to animal (cat) (dog) hair and dander: Secondary | ICD-10-CM | POA: Diagnosis not present

## 2020-08-19 DIAGNOSIS — J3089 Other allergic rhinitis: Secondary | ICD-10-CM | POA: Diagnosis not present

## 2020-08-21 ENCOUNTER — Telehealth: Payer: Self-pay | Admitting: *Deleted

## 2020-08-21 ENCOUNTER — Other Ambulatory Visit: Payer: Self-pay

## 2020-08-21 ENCOUNTER — Other Ambulatory Visit (HOSPITAL_BASED_OUTPATIENT_CLINIC_OR_DEPARTMENT_OTHER): Payer: Self-pay

## 2020-08-21 MED FILL — Gabapentin Tab 600 MG: ORAL | 30 days supply | Qty: 135 | Fill #1 | Status: AC

## 2020-08-21 NOTE — Telephone Encounter (Signed)
Patient is calling with some questions after receiving her fungus results thru vmessage. She wanted to know the next steps moving forward, should she schedule a f/u appointment and the name of the nail gel mentioned in the call. Please advise.

## 2020-08-22 ENCOUNTER — Other Ambulatory Visit: Payer: Self-pay

## 2020-08-22 ENCOUNTER — Ambulatory Visit (INDEPENDENT_AMBULATORY_CARE_PROVIDER_SITE_OTHER): Payer: Medicare PPO | Admitting: Rehabilitative and Restorative Service Providers"

## 2020-08-22 ENCOUNTER — Other Ambulatory Visit (HOSPITAL_BASED_OUTPATIENT_CLINIC_OR_DEPARTMENT_OTHER): Payer: Self-pay

## 2020-08-22 DIAGNOSIS — R29818 Other symptoms and signs involving the nervous system: Secondary | ICD-10-CM | POA: Diagnosis not present

## 2020-08-22 DIAGNOSIS — R2681 Unsteadiness on feet: Secondary | ICD-10-CM

## 2020-08-22 DIAGNOSIS — R42 Dizziness and giddiness: Secondary | ICD-10-CM
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
## 2020-08-22 NOTE — Telephone Encounter (Signed)
Patient came by the [facility] office today and got some toe crest for both of her feet. Lattie Haw

## 2020-08-25 ENCOUNTER — Other Ambulatory Visit (HOSPITAL_BASED_OUTPATIENT_CLINIC_OR_DEPARTMENT_OTHER): Payer: Self-pay

## 2020-08-26 ENCOUNTER — Other Ambulatory Visit: Payer: Self-pay | Admitting: Family Medicine

## 2020-08-26 ENCOUNTER — Other Ambulatory Visit (HOSPITAL_BASED_OUTPATIENT_CLINIC_OR_DEPARTMENT_OTHER): Payer: Self-pay

## 2020-08-26 MED ORDER — FINASTERIDE 5 MG PO TABS
ORAL_TABLET | ORAL | 5 refills | Status: DC
  Filled 2020-08-26: qty 30, 30d supply, fill #0
  Filled 2020-09-25: qty 30, 30d supply, fill #1

## 2020-08-26 MED ORDER — BUPROPION HCL ER (XL) 150 MG PO TB24
ORAL_TABLET | Freq: Every morning | ORAL | 1 refills | Status: DC
  Filled 2020-08-26: qty 90, 30d supply, fill #0
  Filled 2020-09-25: qty 90, 30d supply, fill #1

## 2020-08-27 ENCOUNTER — Ambulatory Visit (INDEPENDENT_AMBULATORY_CARE_PROVIDER_SITE_OTHER): Payer: Medicare PPO | Admitting: Psychology

## 2020-08-27 ENCOUNTER — Other Ambulatory Visit (HOSPITAL_BASED_OUTPATIENT_CLINIC_OR_DEPARTMENT_OTHER): Payer: Self-pay

## 2020-08-27 ENCOUNTER — Encounter: Payer: Self-pay | Admitting: Gastroenterology

## 2020-08-27 ENCOUNTER — Ambulatory Visit (INDEPENDENT_AMBULATORY_CARE_PROVIDER_SITE_OTHER): Payer: Medicare PPO | Admitting: Gastroenterology

## 2020-08-27 ENCOUNTER — Other Ambulatory Visit: Payer: Self-pay

## 2020-08-27 VITALS — BP 110/60 | HR 90 | Ht 66.0 in | Wt 121.4 lb

## 2020-08-27 DIAGNOSIS — R0989 Other specified symptoms and signs involving the circulatory and respiratory systems: Secondary | ICD-10-CM

## 2020-08-27 DIAGNOSIS — R142 Eructation: Secondary | ICD-10-CM | POA: Diagnosis not present

## 2020-08-27 DIAGNOSIS — F411 Generalized anxiety disorder: Secondary | ICD-10-CM

## 2020-08-27 DIAGNOSIS — K581 Irritable bowel syndrome with constipation: Secondary | ICD-10-CM

## 2020-08-27 DIAGNOSIS — K219 Gastro-esophageal reflux disease without esophagitis: Secondary | ICD-10-CM

## 2020-08-27 DIAGNOSIS — R6889 Other general symptoms and signs: Secondary | ICD-10-CM | POA: Diagnosis not present

## 2020-08-27 MED ORDER — OMEPRAZOLE 20 MG PO CPDR
DELAYED_RELEASE_CAPSULE | ORAL | 5 refills | Status: DC
  Filled 2020-08-27: qty 90, 90d supply, fill #0
  Filled 2020-12-16: qty 90, 90d supply, fill #1
  Filled 2021-04-16: qty 90, 90d supply, fill #2
  Filled 2021-07-28: qty 90, 90d supply, fill #3
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
## 2020-08-27 NOTE — Patient Instructions (Signed)
If you are age 71 or older, your body mass index should be between 23-30. Your Body mass index is 19.59 kg/m. If this is out of the aforementioned range listed, please consider follow up with your Primary Care Provider.  If you are age 57 or younger, your body mass index should be between 19-25. Your Body mass index is 19.59 kg/m. If this is out of the aformentioned range listed, please consider follow up with your Primary Care Provider.    We have sent the following medications to your pharmacy for you to pick up at your convenience:  Omeprazole 20mg  daily before dinner.  Due to recent changes in healthcare laws, you may see the results of your imaging and laboratory studies on MyChart before your provider has had a chance to review them.  We understand that in some cases there may be results that are confusing or concerning to you. Not all laboratory results come back in the same time frame and the provider may be waiting for multiple results in order to interpret others.  Please give [place] 48 hours in order for your provider to thoroughly review all the results before contacting the office for clarification of your results.   Follow up as needed.  Thank you for choosing me and [facility].  Vito Cirigliano, D.O.

## 2020-08-29 ENCOUNTER — Ambulatory Visit (INDEPENDENT_AMBULATORY_CARE_PROVIDER_SITE_OTHER): Payer: Medicare PPO | Admitting: Rehabilitative and Restorative Service Providers"

## 2020-08-29 ENCOUNTER — Other Ambulatory Visit: Payer: Self-pay

## 2020-08-29 DIAGNOSIS — R748 Abnormal levels of other serum enzymes: Secondary | ICD-10-CM | POA: Diagnosis not present

## 2020-08-29 DIAGNOSIS — R2681 Unsteadiness on feet: Secondary | ICD-10-CM

## 2020-08-29 DIAGNOSIS — R42 Dizziness and giddiness: Secondary | ICD-10-CM

## 2020-08-29 DIAGNOSIS — R29818 Other symptoms and signs involving the nervous system: Secondary | ICD-10-CM | POA: Diagnosis not present

## 2020-08-29 LAB — HEPATIC FUNCTION PANEL
AG Ratio: 2.1 (calc) (ref 1.0–2.5)
ALT: 26 U/L (ref 6–29)
AST: 28 U/L (ref 10–35)
Albumin: 4.2 g/dL (ref 3.6–5.1)
Alkaline phosphatase (APISO): 86 U/L (ref 37–153)
Bilirubin, Direct: 0.1 mg/dL (ref 0.0–0.2)
Globulin: 2 g/dL (calc) (ref 1.9–3.7)
Indirect Bilirubin: 0.3 mg/dL (calc) (ref 0.2–1.2)
Total Bilirubin: 0.4 mg/dL (ref 0.2–1.2)
Total Protein: 6.2 g/dL (ref 6.1–8.1)
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
## 2020-08-29 NOTE — Patient Instructions (Signed)
Access Code: [email or web address] URL: [email or web address] Date: 08/29/2020 Prepared by: Rudell Cobb  Exercises Squat with Chair Touch - 1 x daily - 7 x weekly - 1 sets - 10 reps Standing Gaze Stabilization with Head Rotation - 2 x daily - 7 x weekly - 1 sets - 25 reps Feet Together Balance with Head Rotation - 1 x daily - 7 x weekly - 1 sets - 5 reps Standing Single Leg Stance with Counter Support - 1 x daily - 7 x weekly - 1 sets - 3 reps - 10-15 seconds hold Heel rises with counter support - 1 x daily - 7 x weekly - 1 sets - 10-20 reps Standing Anti-Rotation Press with Anchored Resistance - 1 x daily - 7 x weekly - 1 sets - 10 reps Standing Bilateral Low Shoulder Row with Anchored Resistance - 1 x daily - 7 x weekly - 1 sets - 10 reps Side Stepping with Resistance at Thighs - 1 x daily - 7 x weekly - 1 sets - 10 reps Prone Hip Extension - 1 x daily - 7 x weekly - 1 sets - 5-10 reps Rolling right<>left sides for vestibular habituation - 2 x daily - 7 x weekly - 1 sets - 10 reps

## 2020-09-01 NOTE — Progress Notes (Signed)
Robin Conrad,   Dr. Jerilynn Mages is out of town. My name is Robin Planas PA-C.   Your hepatic function test look great from 3 month follow up. Liver enzyme seemed to normalize.

## 2020-09-02 DIAGNOSIS — J3081 Allergic rhinitis due to animal (cat) (dog) hair and dander: Secondary | ICD-10-CM | POA: Diagnosis not present

## 2020-09-02 DIAGNOSIS — J301 Allergic rhinitis due to pollen: Secondary | ICD-10-CM | POA: Diagnosis not present

## 2020-09-02 DIAGNOSIS — J3089 Other allergic rhinitis: Secondary | ICD-10-CM | POA: Diagnosis not present

## 2020-09-04 ENCOUNTER — Other Ambulatory Visit (HOSPITAL_BASED_OUTPATIENT_CLINIC_OR_DEPARTMENT_OTHER): Payer: Self-pay

## 2020-09-05 ENCOUNTER — Ambulatory Visit (INDEPENDENT_AMBULATORY_CARE_PROVIDER_SITE_OTHER): Payer: Medicare PPO | Admitting: Rehabilitative and Restorative Service Providers"

## 2020-09-05 ENCOUNTER — Other Ambulatory Visit: Payer: Self-pay

## 2020-09-05 DIAGNOSIS — R29818 Other symptoms and signs involving the nervous system: Secondary | ICD-10-CM

## 2020-09-05 DIAGNOSIS — R42 Dizziness and giddiness: Secondary | ICD-10-CM | POA: Diagnosis not present

## 2020-09-05 DIAGNOSIS — R2681 Unsteadiness on feet: Secondary | ICD-10-CM | POA: Diagnosis not present

## 2020-09-05 NOTE — Patient Instructions (Signed)
Access Code: [email or web address] URL: [email or web address] Date: 09/05/2020 Prepared by: Rudell Cobb  Exercises Squat with Chair Touch - 1 x daily - 7 x weekly - 1 sets - 10 reps Standing Gaze Stabilization with Head Rotation - 2 x daily - 7 x weekly - 1 sets - 25 reps Feet Together Balance with Head Rotation - 1 x daily - 7 x weekly - 1 sets - 5 reps Standing Single Leg Stance with Counter Support - 1 x daily - 7 x weekly - 1 sets - 3 reps - 10-15 seconds hold Standing Anti-Rotation Press with Anchored Resistance - 1 x daily - 7 x weekly - 1 sets - 10 reps Standing Bilateral Low Shoulder Row with Anchored Resistance - 1 x daily - 7 x weekly - 1 sets - 10 reps Side Stepping with Resistance at Thighs - 1 x daily - 7 x weekly - 1 sets - 10 reps Prone Hip Extension - 1 x daily - 7 x weekly - 1 sets - 5-10 reps Supine Figure 4 Piriformis Stretch - 2 x daily - 7 x weekly - 1 sets - 2 reps - 30 seconds hold Rolling right<>left sides for vestibular habituation - 2 x daily - 7 x weekly - 1 sets - 5 reps
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
## 2020-09-08 ENCOUNTER — Encounter: Payer: Self-pay | Admitting: Family Medicine

## 2020-09-09 ENCOUNTER — Encounter: Payer: Self-pay | Admitting: Physician Assistant

## 2020-09-09 ENCOUNTER — Other Ambulatory Visit (HOSPITAL_BASED_OUTPATIENT_CLINIC_OR_DEPARTMENT_OTHER): Payer: Self-pay

## 2020-09-09 ENCOUNTER — Ambulatory Visit (INDEPENDENT_AMBULATORY_CARE_PROVIDER_SITE_OTHER): Payer: Medicare PPO | Admitting: Physician Assistant

## 2020-09-09 ENCOUNTER — Other Ambulatory Visit: Payer: Self-pay

## 2020-09-09 VITALS — BP 128/60 | HR 92 | Temp 98.7°F | Resp 20 | Ht 66.0 in | Wt 121.0 lb

## 2020-09-09 DIAGNOSIS — N3001 Acute cystitis with hematuria: Secondary | ICD-10-CM | POA: Diagnosis not present

## 2020-09-09 DIAGNOSIS — G8929 Other chronic pain: Secondary | ICD-10-CM

## 2020-09-09 DIAGNOSIS — M5030 Other cervical disc degeneration, unspecified cervical region: Secondary | ICD-10-CM

## 2020-09-09 DIAGNOSIS — M797 Fibromyalgia: Secondary | ICD-10-CM | POA: Diagnosis not present

## 2020-09-09 DIAGNOSIS — M359 Systemic involvement of connective tissue, unspecified: Secondary | ICD-10-CM | POA: Diagnosis not present

## 2020-09-09 DIAGNOSIS — M542 Cervicalgia: Secondary | ICD-10-CM | POA: Diagnosis not present

## 2020-09-09 DIAGNOSIS — J0140 Acute pansinusitis, unspecified: Secondary | ICD-10-CM

## 2020-09-09 LAB — POCT URINALYSIS DIP (CLINITEK)
Bilirubin, UA: NEGATIVE
Blood, UA: NEGATIVE
Glucose, UA: NEGATIVE mg/dL
Ketones, POC UA: NEGATIVE mg/dL
Nitrite, UA: POSITIVE — AB
POC PROTEIN,UA: NEGATIVE
Spec Grav, UA: >=1.03 — AB (ref 1.010–1.025)
Urobilinogen, UA: 0.2 E.U./dL
pH, UA: 6 (ref 5.0–8.0)

## 2020-09-09 MED ORDER — AMOXICILLIN-POT CLAVULANATE 875-125 MG PO TABS
1.0000 | ORAL_TABLET | Freq: Two times a day (BID) | ORAL | 0 refills | Status: AC
  Filled 2020-09-09: qty 20, 10d supply, fill #0

## 2020-09-09 NOTE — Patient Instructions (Signed)
Follow up with Dr. Darene Lamer for neck pain.   Urinary Tract Infection, Adult A urinary tract infection (UTI) is an infection of any part of the urinary tract. The urinary tract includes: The kidneys. The ureters. The bladder. The urethra. These organs make, store, and get rid of pee (urine) in the body. What are the causes? This infection is caused by germs (bacteria) in your genital area. These germs grow and cause swelling (inflammation) of your urinary tract. What increases the risk? The following factors may make you more likely to develop this condition: Using a small, thin tube (catheter) to drain pee. Not being able to control when you pee or poop (incontinence). Being female. If you are female, these things can increase the risk: Using these methods to prevent pregnancy: A medicine that kills sperm (spermicide). A device that blocks sperm (diaphragm). Having low levels of a female hormone (estrogen). Being pregnant. You are more likely to develop this condition if: You have genes that add to your risk. You are sexually active. You take antibiotic medicines. You have trouble peeing because of: A prostate that is bigger than normal, if you are female. A blockage in the part of your body that drains pee from the bladder. A kidney stone. A nerve condition that affects your bladder. Not getting enough to drink. Not peeing often enough. You have other conditions, such as: Diabetes. A weak disease-fighting system (immune system). Sickle cell disease. Gout. Injury of the spine. What are the signs or symptoms? Symptoms of this condition include: Needing to pee right away. Peeing small amounts often. Pain or burning when peeing. Blood in the pee. Pee that smells bad or not like normal. Trouble peeing. Pee that is cloudy. Fluid coming from the vagina, if you are female. Pain in the belly or lower back. Other symptoms include: Vomiting. Not feeling hungry. Feeling mixed up  (confused). This may be the first symptom in older adults. Being tired and grouchy (irritable). A fever. Watery poop (diarrhea). How is this treated? Taking antibiotic medicine. Taking other medicines. Drinking enough water. In some cases, you may need to see a specialist. Follow these instructions at home:  Medicines Take over-the-counter and prescription medicines only as told by your doctor. If you were prescribed an antibiotic medicine, take it as told by your doctor. Do not stop taking it even if you start to feel better. General instructions Make sure you: Pee until your bladder is empty. Do not hold pee for a long time. Empty your bladder after sex. Wipe from front to back after peeing or pooping if you are a female. Use each tissue one time when you wipe. Drink enough fluid to keep your pee pale yellow. Keep all follow-up visits. Contact a doctor if: You do not get better after 1-2 days. Your symptoms go away and then come back. Get help right away if: You have very bad back pain. You have very bad pain in your lower belly. You have a fever. You have chills. You feeling like you will vomit or you vomit. Summary A urinary tract infection (UTI) is an infection of any part of the urinary tract. This condition is caused by germs in your genital area. There are many risk factors for a UTI. Treatment includes antibiotic medicines. Drink enough fluid to keep your pee pale yellow. This information is not intended to replace advice given to you by your health care provider. Make sure you discuss any questions you have with your healthcare provider. Document  Revised: 10/12/2019 Document Reviewed: 10/12/2019 Elsevier Patient Education  [place].
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
## 2020-09-11 ENCOUNTER — Other Ambulatory Visit: Payer: Self-pay

## 2020-09-11 ENCOUNTER — Ambulatory Visit (INDEPENDENT_AMBULATORY_CARE_PROVIDER_SITE_OTHER): Payer: Medicare PPO | Admitting: Rehabilitative and Restorative Service Providers"

## 2020-09-11 DIAGNOSIS — R42 Dizziness and giddiness: Secondary | ICD-10-CM

## 2020-09-11 DIAGNOSIS — R29818 Other symptoms and signs involving the nervous system: Secondary | ICD-10-CM | POA: Diagnosis not present

## 2020-09-11 DIAGNOSIS — R2681 Unsteadiness on feet: Secondary | ICD-10-CM
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
## 2020-09-16 ENCOUNTER — Ambulatory Visit (INDEPENDENT_AMBULATORY_CARE_PROVIDER_SITE_OTHER): Payer: Medicare PPO | Admitting: Psychology

## 2020-09-16 ENCOUNTER — Other Ambulatory Visit: Payer: Self-pay

## 2020-09-16 ENCOUNTER — Other Ambulatory Visit (HOSPITAL_BASED_OUTPATIENT_CLINIC_OR_DEPARTMENT_OTHER): Payer: Self-pay

## 2020-09-16 ENCOUNTER — Ambulatory Visit (INDEPENDENT_AMBULATORY_CARE_PROVIDER_SITE_OTHER): Payer: Medicare PPO | Admitting: Sports Medicine

## 2020-09-16 ENCOUNTER — Ambulatory Visit (INDEPENDENT_AMBULATORY_CARE_PROVIDER_SITE_OTHER): Payer: Medicare PPO

## 2020-09-16 DIAGNOSIS — M47812 Spondylosis without myelopathy or radiculopathy, cervical region: Secondary | ICD-10-CM | POA: Diagnosis not present

## 2020-09-16 DIAGNOSIS — R200 Anesthesia of skin: Secondary | ICD-10-CM | POA: Insufficient documentation

## 2020-09-16 DIAGNOSIS — R202 Paresthesia of skin: Secondary | ICD-10-CM

## 2020-09-16 DIAGNOSIS — J3089 Other allergic rhinitis: Secondary | ICD-10-CM | POA: Diagnosis not present

## 2020-09-16 DIAGNOSIS — M542 Cervicalgia: Secondary | ICD-10-CM | POA: Diagnosis not present

## 2020-09-16 DIAGNOSIS — J301 Allergic rhinitis due to pollen: Secondary | ICD-10-CM | POA: Diagnosis not present

## 2020-09-16 DIAGNOSIS — J3081 Allergic rhinitis due to animal (cat) (dog) hair and dander: Secondary | ICD-10-CM | POA: Diagnosis not present

## 2020-09-16 DIAGNOSIS — F411 Generalized anxiety disorder: Secondary | ICD-10-CM

## 2020-09-16 IMAGING — DX DG CERVICAL SPINE COMPLETE 4+V
6 series · 6 of 6 positions shown · non-contrast
Comparison: [DATE].

CLINICAL DATA: Neck pain and left hand numbness for several weeks
without known injury.

EXAM:
CERVICAL SPINE - COMPLETE 4+ VIEW

[c-spine lat]
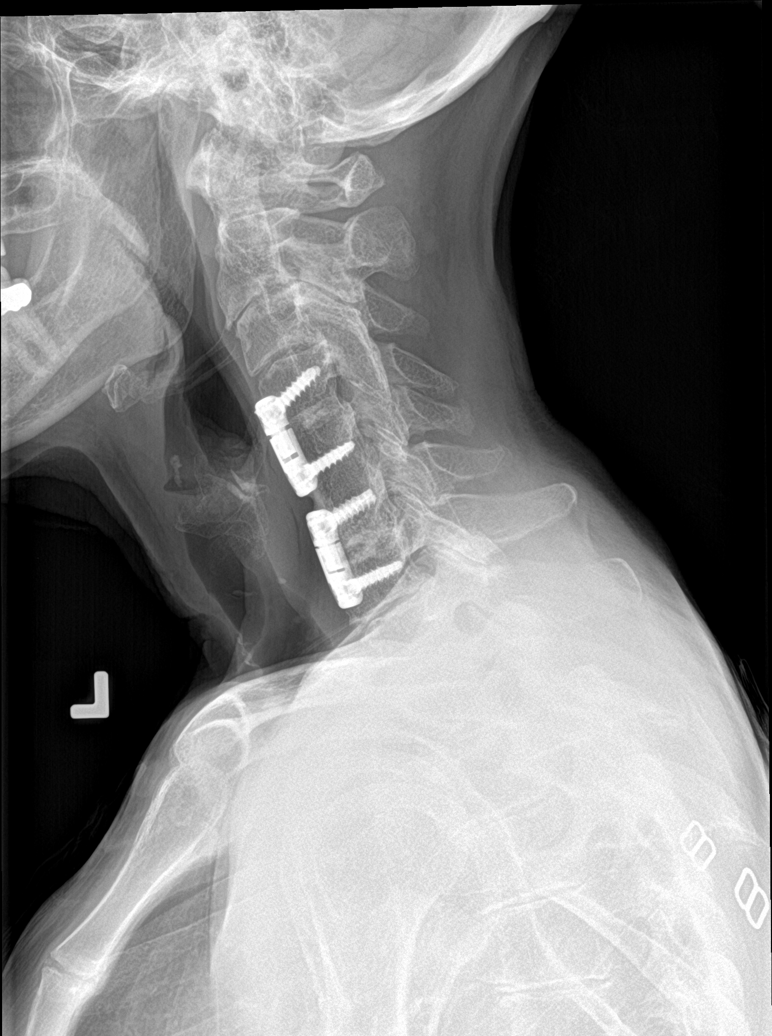

[c-spine obl (1 of 2)]
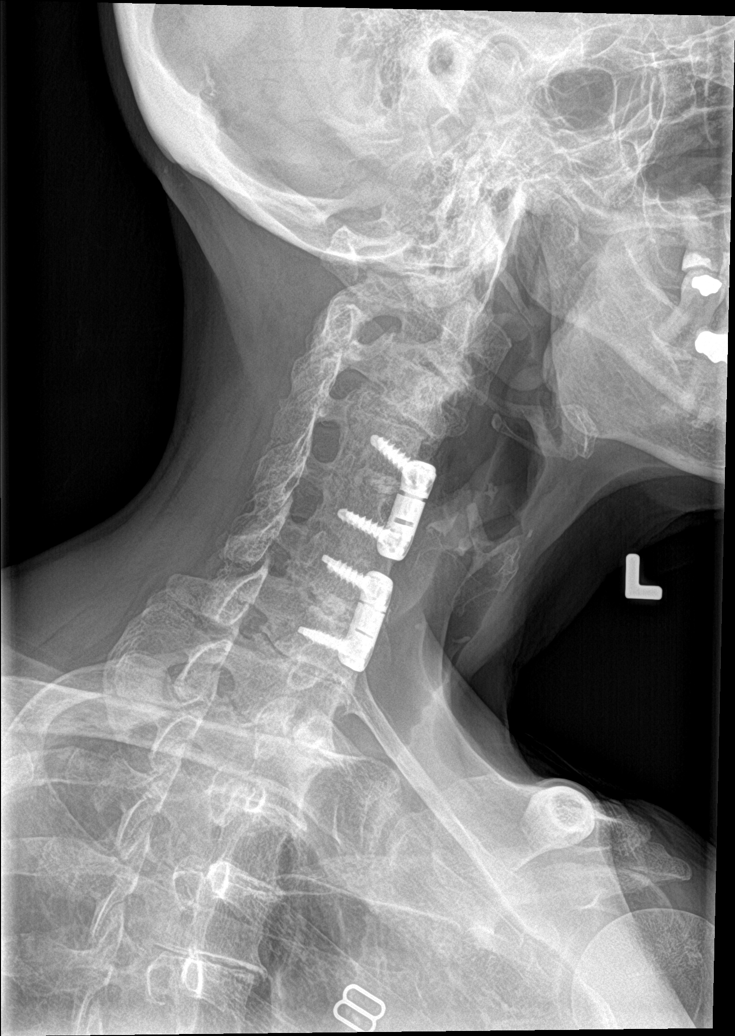

[c-spine obl (2 of 2)]
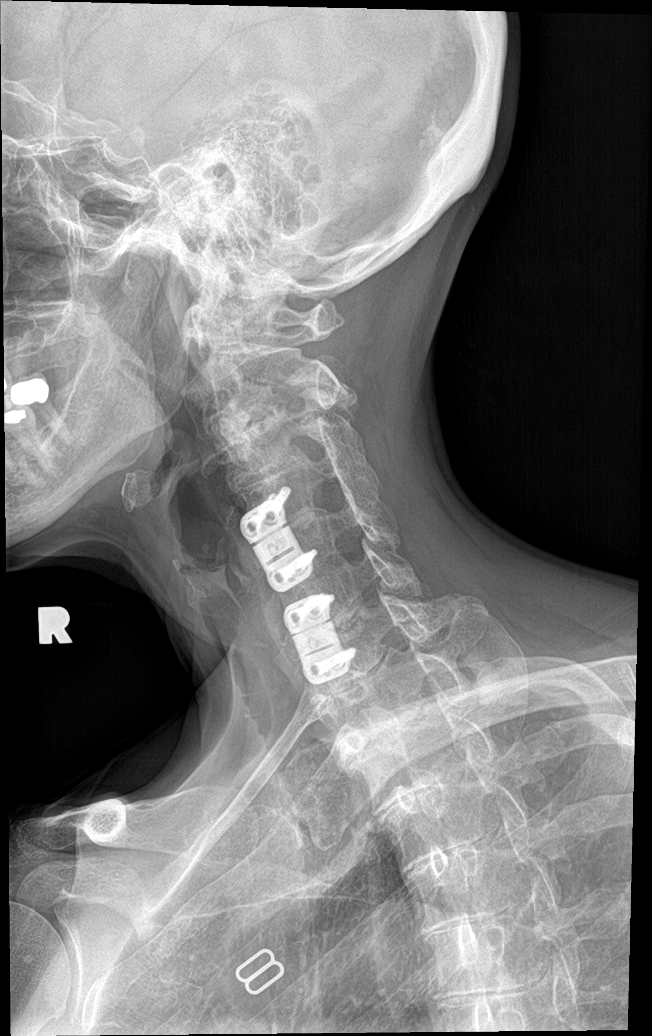

[c-spine ap]
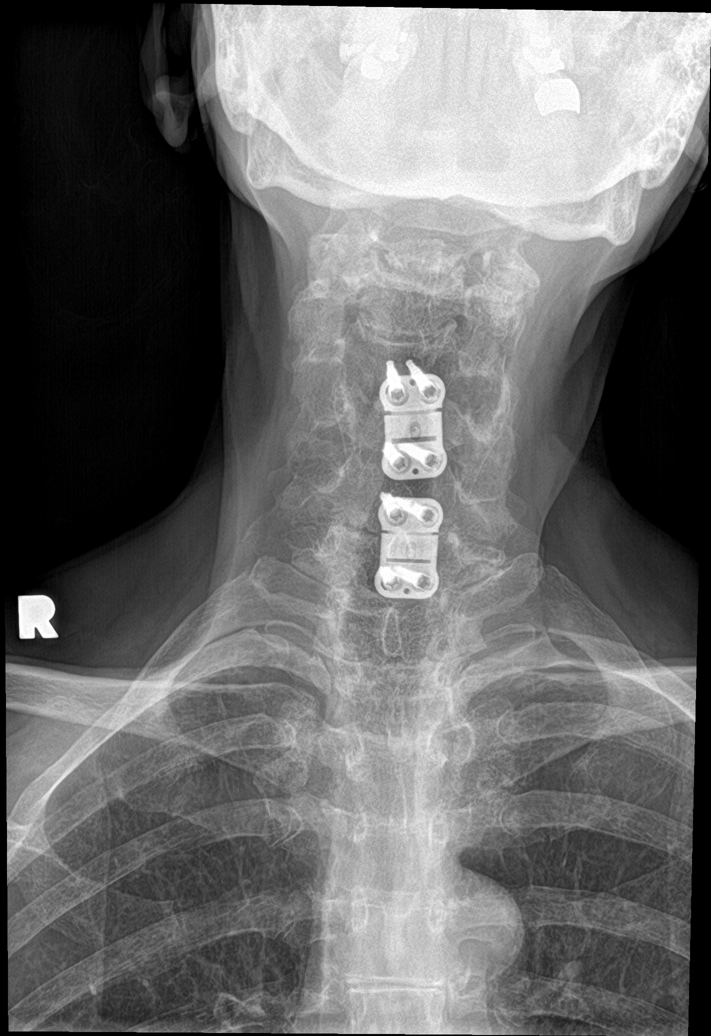

[c-spine open mouth]
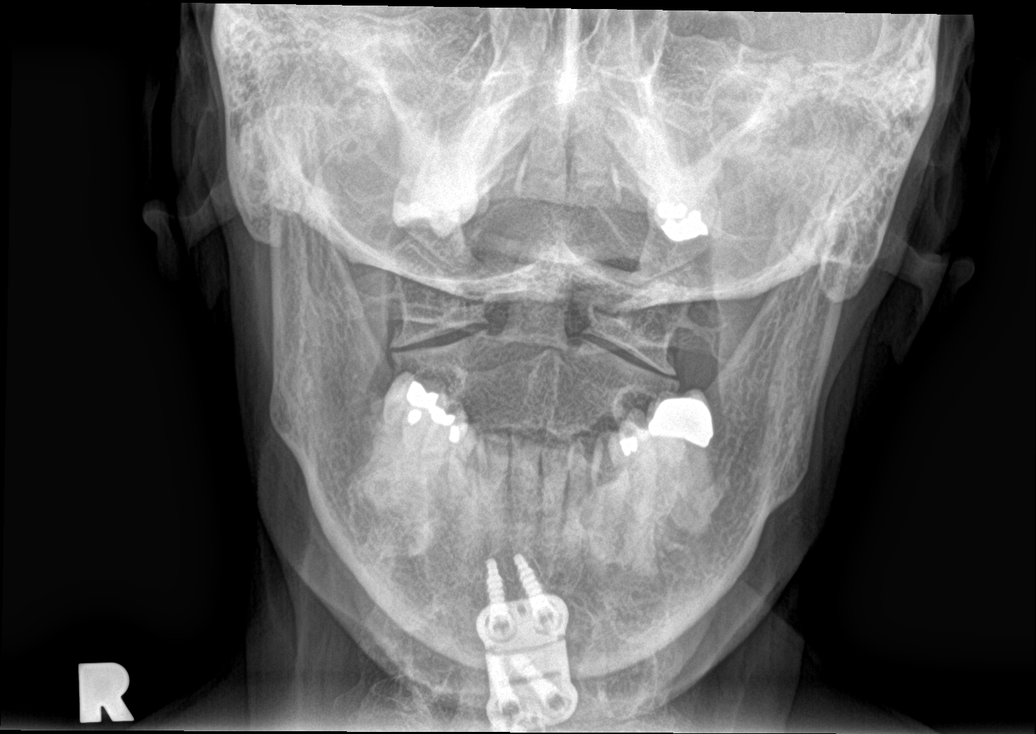

[c-spine swimmers]
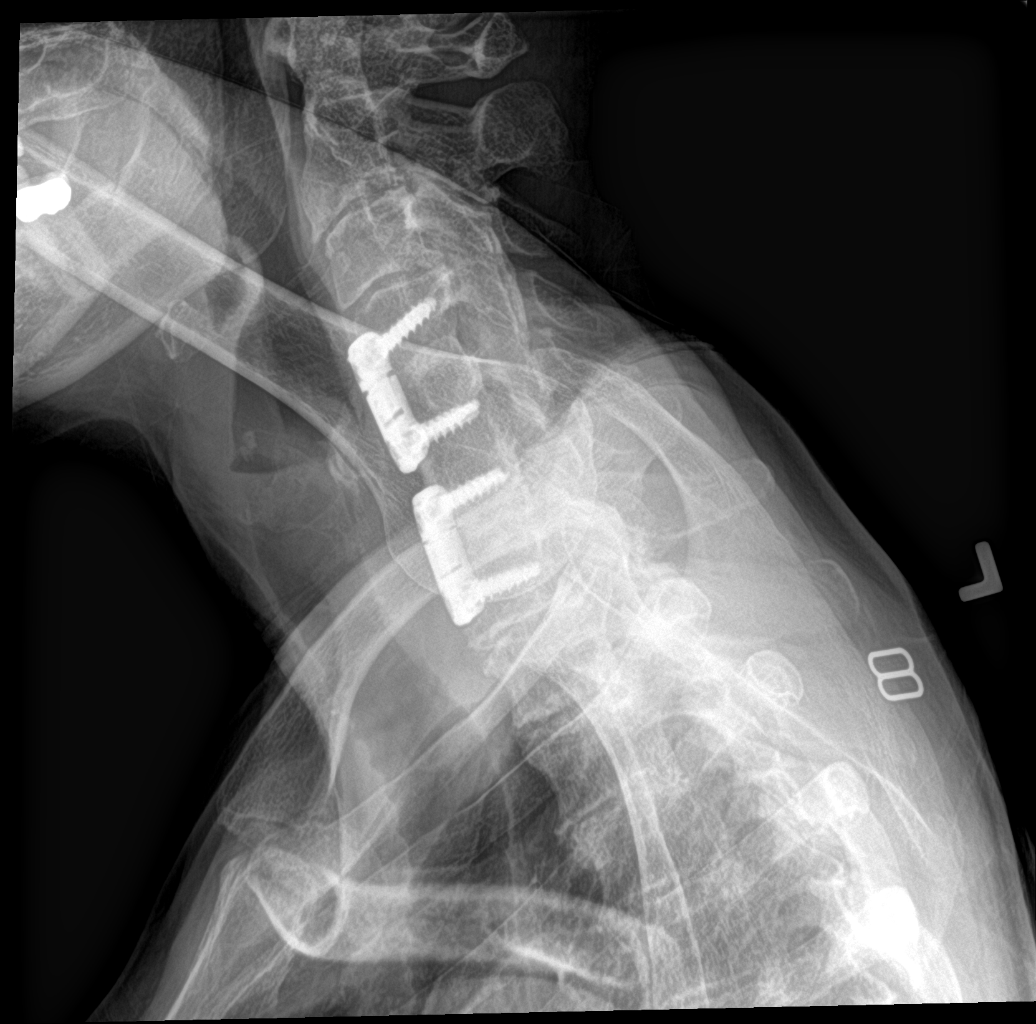

[6 of 6 positions shown; findings below may reference images not displayed]

FINDINGS: Status post surgical anterior fusion of C4-5 and C6-7. No fracture
or significant spondylolisthesis is noted. Moderate degenerative
disc disease is noted at C2-3 and C3-4. Fusion of C5-6 is noted most
likely due to degenerative change. No prevertebral soft tissue
swelling is noted. Mild bilateral neural foraminal stenosis is noted
at C6-7 and C7-T1 secondary to uncovertebral spurring.
IMPRESSION: Postsurgical and degenerative changes as described above. No acute
abnormality is noted.

## 2020-09-16 MED ORDER — PREDNISONE 50 MG PO TABS
ORAL_TABLET | ORAL | 0 refills | Status: DC
  Filled 2020-09-16: qty 5, 5d supply, fill #0

## 2020-09-16 NOTE — Assessment & Plan Note (Addendum)
Robin Conrad returns, she is pleasant 70 year old female with multilevel cervical DDD, multilevel cervical facet arthritis. She has adjacent level disease above and below her multilevel cervical fusion. Of note she has incomplete fusion/pseudoarthrosis at the C6-C7 level with intact hardware. Approximately 4 years ago she had a cervical epidural followed by left C2-C3, C3-C4, and C7-T1 facet joint injections, she tells me she did get good relief. She got a second opinion with [facility] neurosurgery, no surgical procedures recommended. She did well until recently, now having increasing pain in both sides of her neck, left worse than right, mostly axial. I want to add 5 days of prednisone, she has finished greater than 6 weeks of conservative treatment, so we will proceed with updated x-rays, she has a bladder stimulator, historically we have had to do CT myelography, she has a new stimulator that is MRI safe so we will also proceed with MRI cervical spine for facet joint injection planning.  Update:  Multilevel cervical facet arthritis on MRI, proceeding with LEFT C2-3, LEFT C3-4, and LEFT C7-T1 CERVICAL FACET INJECTION as these have worked well in the past. Return 4 weeks after injections.  May do the other side in the future if she desires.

## 2020-09-16 NOTE — Progress Notes (Addendum)
    Procedures performed today:    None.  Independent interpretation of notes and tests performed by another provider:   CT myelography reviewed, multilevel facet arthritis, solid fusions with the exception of the C6-C7 level where there is a pseudoarthrosis.  No perihardware lucency.  Brief History, Exam, Impression, and Recommendations:    Cervical spondylosis Robin Conrad returns, she is pleasant 70 year old female with multilevel cervical DDD, multilevel cervical facet arthritis. She has adjacent level disease above and below her multilevel cervical fusion. Of note she has incomplete fusion/pseudoarthrosis at the C6-C7 level with intact hardware. Approximately 4 years ago she had a cervical epidural followed by left C2-C3, C3-C4, and C7-T1 facet joint injections, she tells me she did get good relief. She got a second opinion with [facility] neurosurgery, no surgical procedures recommended. She did well until recently, now having increasing pain in both sides of her neck, left worse than right, mostly axial. I want to add 5 days of prednisone, she has finished greater than 6 weeks of conservative treatment, so we will proceed with updated x-rays, she has a bladder stimulator, historically we have had to do CT myelography, she has a new stimulator that is MRI safe so we will also proceed with MRI cervical spine for facet joint injection planning.  Update:  Multilevel cervical facet arthritis on MRI, proceeding with LEFT C2-3, LEFT C3-4, and LEFT C7-T1 CERVICAL FACET INJECTION as these have worked well in the past. Return 4 weeks after injections.  May do the other side in the future if she desires.  Numbness and tingling in left hand Robin Conrad also complains of numbness and tingling in the left hand, worse when holding her phone and she tends to drop things. Clinically she has a positive Tinel's sign, I think this is carpal tunnel syndrome, she will need a Velcro wrist brace to wear at night,  adding stretches, return to see me in 4 to 6 weeks, we will probably do a median nerve hydrodissection if no better. She is hoping to avoid nerve conduction/EMG if at all possible.    ___________________________________________ Gwen Her. Dianah Field, M.D., ABFM., CAQSM. Primary Care and [place] Instructor of [place] [employer] [facility] [employer]

## 2020-09-16 NOTE — Assessment & Plan Note (Signed)
Robin Conrad also complains of numbness and tingling in the left hand, worse when holding her phone and she tends to drop things. Clinically she has a positive Tinel's sign, I think this is carpal tunnel syndrome, she will need a Velcro wrist brace to wear at night, adding stretches, return to see me in 4 to 6 weeks, we will probably do a median nerve hydrodissection if no better. She is hoping to avoid nerve conduction/EMG if at all possible.

## 2020-09-17 DIAGNOSIS — L638 Other alopecia areata: Secondary | ICD-10-CM | POA: Diagnosis not present

## 2020-09-19 DIAGNOSIS — H2513 Age-related nuclear cataract, bilateral: Secondary | ICD-10-CM | POA: Diagnosis not present

## 2020-09-19 DIAGNOSIS — H524 Presbyopia: Secondary | ICD-10-CM | POA: Diagnosis not present

## 2020-09-21 ENCOUNTER — Other Ambulatory Visit: Payer: Self-pay

## 2020-09-21 ENCOUNTER — Ambulatory Visit (INDEPENDENT_AMBULATORY_CARE_PROVIDER_SITE_OTHER): Payer: Medicare PPO

## 2020-09-21 DIAGNOSIS — M47812 Spondylosis without myelopathy or radiculopathy, cervical region: Secondary | ICD-10-CM | POA: Diagnosis not present

## 2020-09-21 DIAGNOSIS — M542 Cervicalgia: Secondary | ICD-10-CM

## 2020-09-21 IMAGING — MR MR CERVICAL SPINE W/O CM
5 series · 40 of 48 positions shown · non-contrast
Comparison: Cervical spine x-rays dated [DATE]. CT cervical
myelogram dated [DATE].

CLINICAL DATA: Chronic neck pain and left hand numbness. History of
prior surgery. No injury.

EXAM:
MRI CERVICAL SPINE WITHOUT CONTRAST
TECHNIQUE: Multiplanar, multisequence MR imaging of the cervical spine was
performed. No intravenous contrast was administered.

[Series 2: T2 · sagittal · 3.0mm · 0.69mm/px · 6 of 13 slices shown (1 of 2)]
[im 1/13]
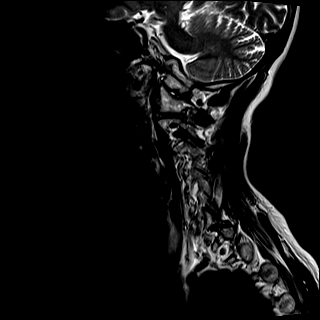
[im 3/13]
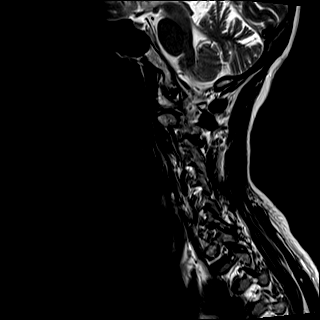
[im 5/13]
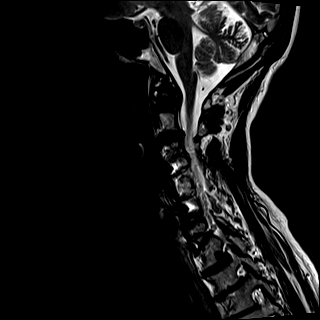
[im 8/13]
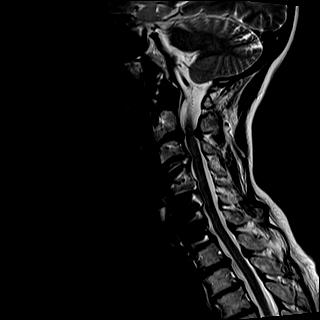
[im 10/13]
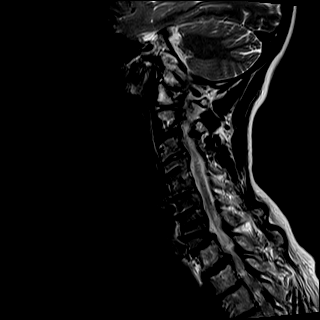
[im 13/13]
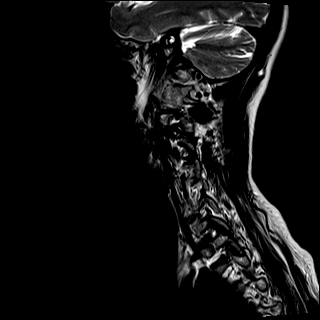

[Series 3: T1 · sagittal · 3.0mm · 0.86mm/px · 6 of 13 slices shown]
[im 1/13]
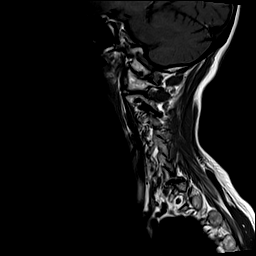
[im 3/13]
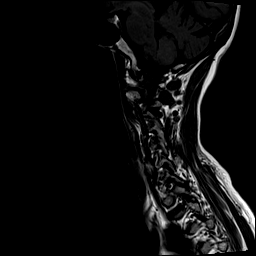
[im 5/13]
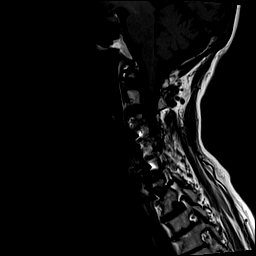
[im 8/13]
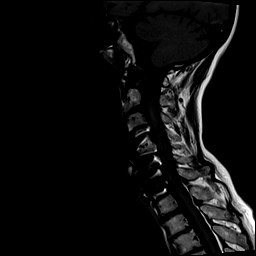
[im 10/13]
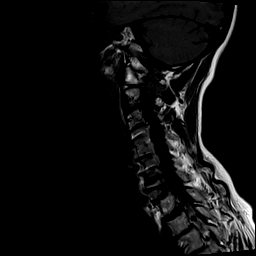
[im 13/13]
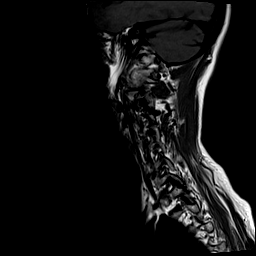

[Series 4: STIR · sagittal · 3.0mm · 0.69mm/px · 6 of 13 slices shown]
[im 1/13]
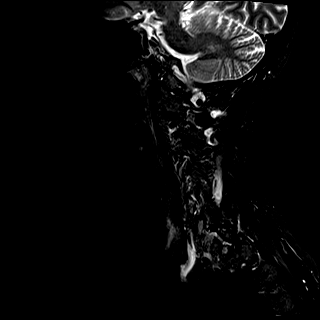
[im 3/13]
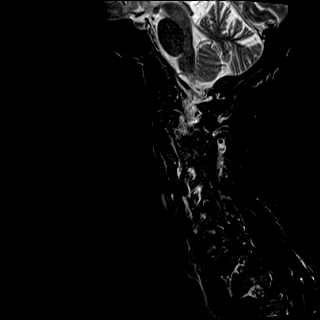
[im 5/13]
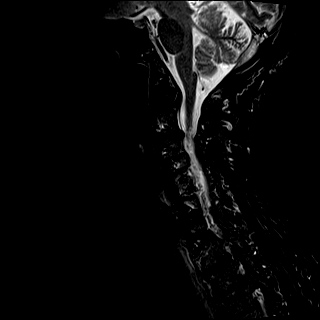
[im 8/13]
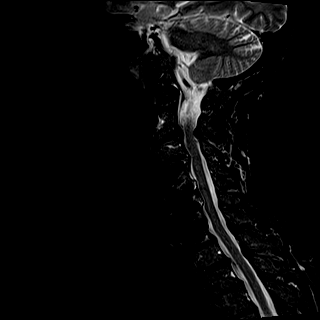
[im 10/13]
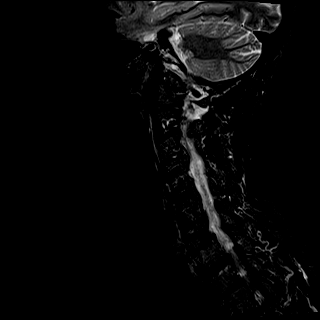
[im 13/13]
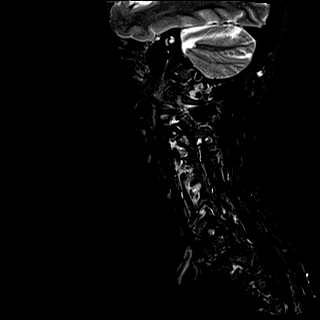

[Series 5: T2 · axial · 3.0mm · 0.62mm/px · z∈[-71,+38]mm · 14 of 31 slices shown (2 of 2)]
[im 1/31]
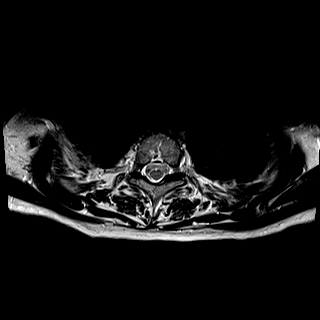
[im 3/31]
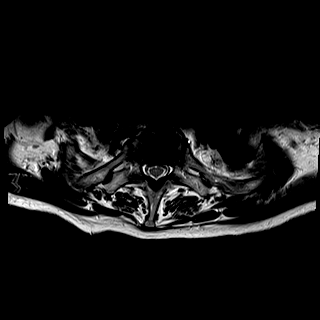
[im 5/31]
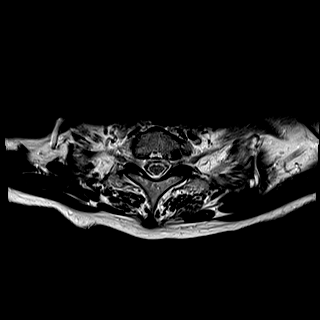
[im 7/31]
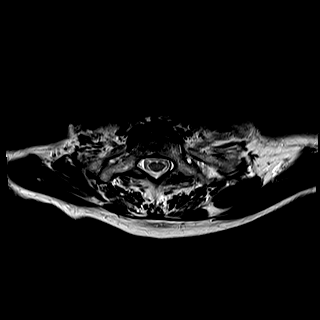
[im 9/31]
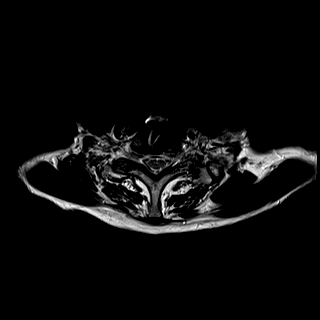
[im 11/31]
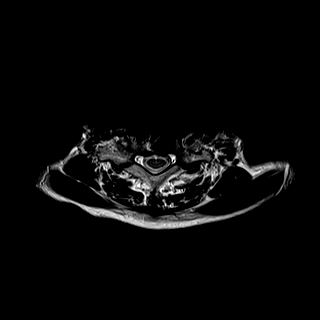
[im 13/31]
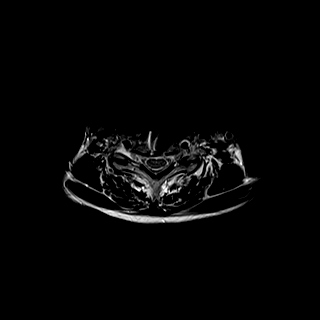
[im 16/31]
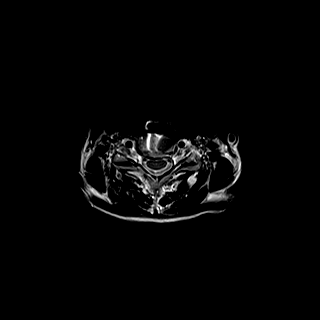
[im 18/31]
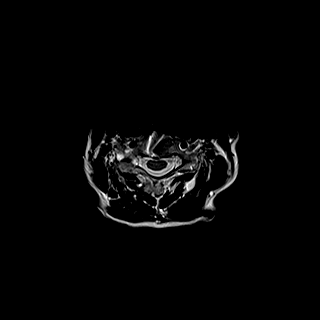
[im 20/31]
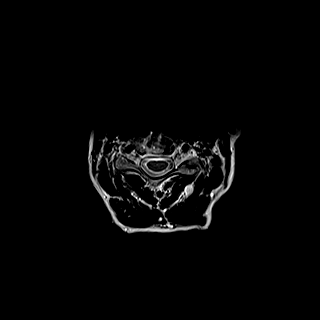
[im 22/31]
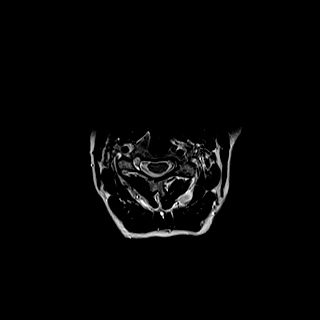
[im 24/31]
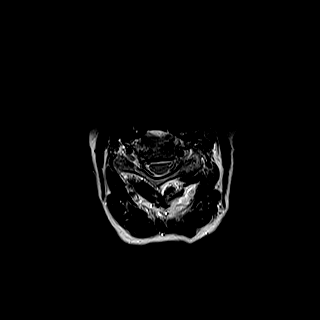
[im 26/31]
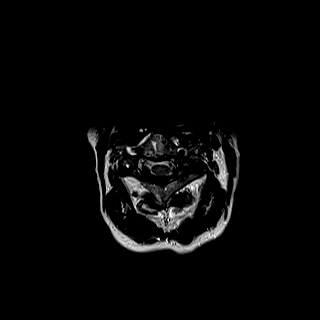
[im 31/31]
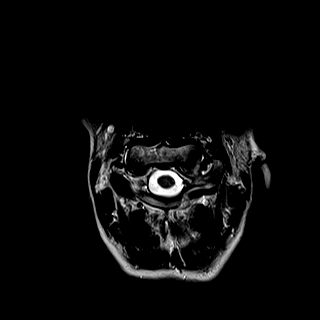

[Series 6: mpgr ax · axial · 3.0mm · 0.35mm/px · z∈[-59,+50]mm · 8 of 31 slices shown]
[im 1/31]
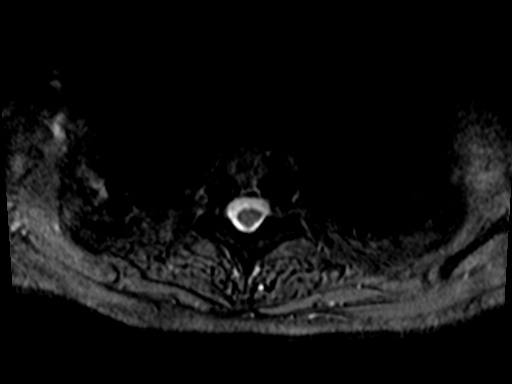
[im 5/31]
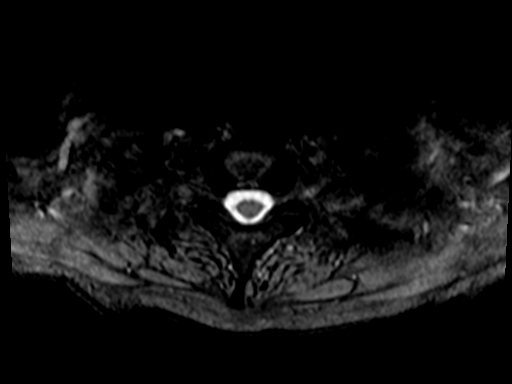
[im 9/31]
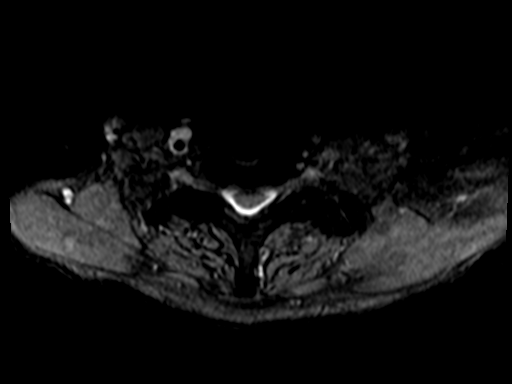
[im 13/31]
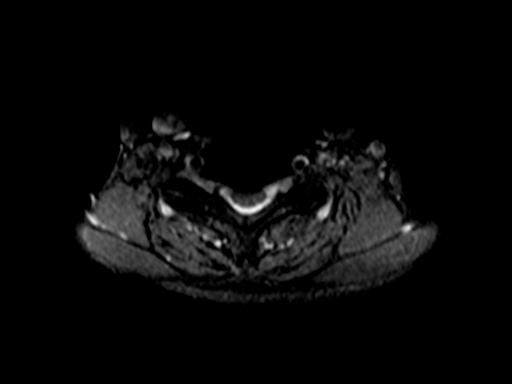
[im 18/31]
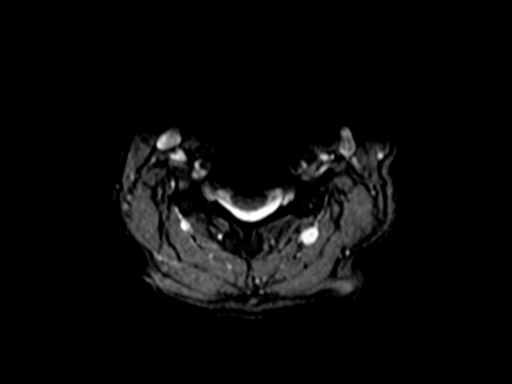
[im 22/31]
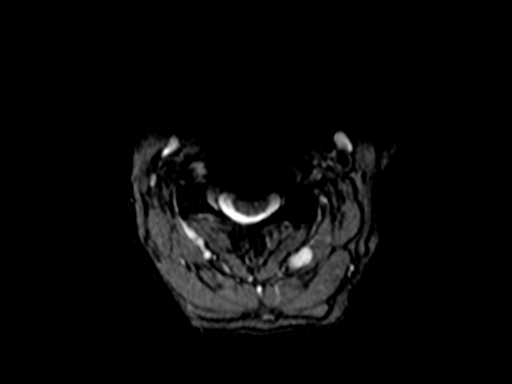
[im 26/31]
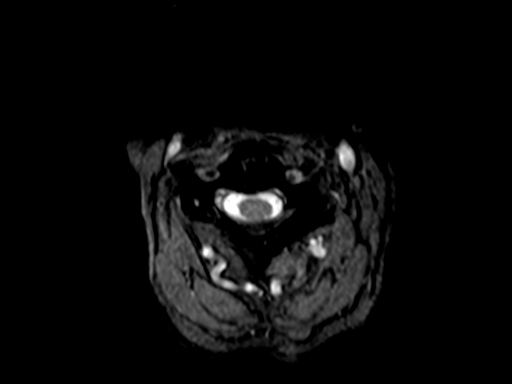
[im 31/31]
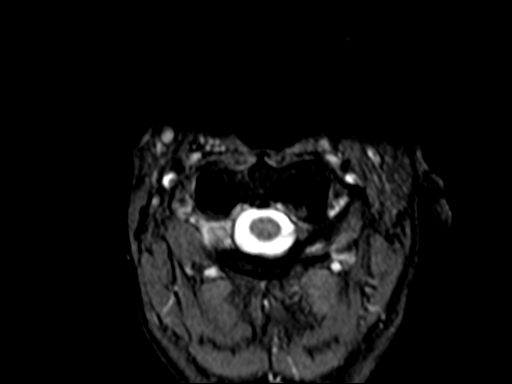

[40 of 48 positions shown; findings below may reference images not displayed]

FINDINGS: Alignment: Chronic straightening of the normal cervical lordosis. No
significant listhesis.

Vertebrae: Prior C4-C7 ACDF. No fracture, evidence of discitis, or
suspicious bone lesion.

Cord: Normal signal and morphology.

Posterior Fossa, vertebral arteries, paraspinal tissues: Biapical
pleuroparenchymal scarring. Otherwise negative.

Disc levels:

C2-C3: Unchanged small central disc protrusion, calcified on prior
CT. Progressive now severe bilateral facet arthropathy with
ligamentum flavum hypertrophy asymmetric to the right. New mild
central spinal canal stenosis. No neuroforaminal stenosis.

C3-C4: Unchanged small posterior disc osteophyte complex and mild
bilateral uncovertebral hypertrophy. Interval ankylosis of the left
facet joint. Unchanged mild to moderate right facet arthropathy. No
stenosis.

C4-C5:  Prior ACDF.  No residual stenosis.

C5-C6:  Prior ACDF.  No residual stenosis.

C6-C7: Prior ACDF. Unchanged moderate bilateral uncovertebral
hypertrophy and mild bilateral facet arthropathy. No residual
stenosis.

C7-T1: Unchanged mild disc bulging and endplate spurring eccentric
to the right. Unchanged moderate left and mild right facet
arthropathy. No stenosis.

T1-T2: Unchanged severe disc height loss with diffuse endplate
spurring extending into both neural foramina. Unchanged mild
bilateral neuroforaminal stenosis. No spinal canal stenosis.
IMPRESSION: 1. Progressive now severe bilateral facet arthropathy at C2-C3. New
mild central spinal canal stenosis at this level.
2. Prior C4-C7 ACDF without residual stenosis.

## 2020-09-22 DIAGNOSIS — M797 Fibromyalgia: Secondary | ICD-10-CM | POA: Diagnosis not present

## 2020-09-22 DIAGNOSIS — M351 Other overlap syndromes: Secondary | ICD-10-CM | POA: Diagnosis not present

## 2020-09-22 DIAGNOSIS — Z791 Long term (current) use of non-steroidal anti-inflammatories (NSAID): Secondary | ICD-10-CM | POA: Diagnosis not present

## 2020-09-23 NOTE — Addendum Note (Signed)
Addended by: Silverio Decamp on: 09/23/2020 11:47 PM   Modules accepted: Orders

## 2020-09-25 ENCOUNTER — Other Ambulatory Visit (HOSPITAL_BASED_OUTPATIENT_CLINIC_OR_DEPARTMENT_OTHER): Payer: Self-pay

## 2020-09-25 MED FILL — Gabapentin Tab 600 MG: ORAL | 30 days supply | Qty: 135 | Fill #2 | Status: AC

## 2020-09-29 ENCOUNTER — Other Ambulatory Visit: Payer: Medicare PPO

## 2020-09-30 DIAGNOSIS — J3081 Allergic rhinitis due to animal (cat) (dog) hair and dander: Secondary | ICD-10-CM | POA: Diagnosis not present

## 2020-09-30 DIAGNOSIS — J301 Allergic rhinitis due to pollen: Secondary | ICD-10-CM | POA: Diagnosis not present

## 2020-09-30 DIAGNOSIS — J3089 Other allergic rhinitis: Secondary | ICD-10-CM | POA: Diagnosis not present

## 2020-10-01 DIAGNOSIS — J3081 Allergic rhinitis due to animal (cat) (dog) hair and dander: Secondary | ICD-10-CM | POA: Diagnosis not present

## 2020-10-01 DIAGNOSIS — J3089 Other allergic rhinitis: Secondary | ICD-10-CM | POA: Diagnosis not present

## 2020-10-01 DIAGNOSIS — J301 Allergic rhinitis due to pollen: Secondary | ICD-10-CM | POA: Diagnosis not present

## 2020-10-02 ENCOUNTER — Other Ambulatory Visit: Payer: Self-pay | Admitting: Family Medicine

## 2020-10-02 ENCOUNTER — Other Ambulatory Visit (HOSPITAL_BASED_OUTPATIENT_CLINIC_OR_DEPARTMENT_OTHER): Payer: Self-pay

## 2020-10-02 DIAGNOSIS — M797 Fibromyalgia: Secondary | ICD-10-CM

## 2020-10-02 MED ORDER — METHOCARBAMOL 500 MG PO TABS
ORAL_TABLET | ORAL | 1 refills | Status: DC
  Filled 2020-10-02: qty 30, 15d supply, fill #0

## 2020-10-06 ENCOUNTER — Ambulatory Visit
Admission: RE | Admit: 2020-10-06 | Discharge: 2020-10-06 | Disposition: A | Discharge status: Home / Self Care | Payer: Medicare PPO | Source: Ambulatory Visit | Attending: Sports Medicine | Admitting: Sports Medicine

## 2020-10-06 ENCOUNTER — Other Ambulatory Visit: Payer: Self-pay | Admitting: Sports Medicine

## 2020-10-06 DIAGNOSIS — M47812 Spondylosis without myelopathy or radiculopathy, cervical region: Secondary | ICD-10-CM | POA: Diagnosis not present

## 2020-10-06 IMAGING — XA DG FACET JT INJ C&T SPINE SINGLE LEVEL *L*
4 series · 4 of 4 positions shown · non-contrast
Comparison: none

CLINICAL DATA: Cervical spondylosis. Positional neck pain. Facet
arthropathy. Request for facet injections at C2-3, C3-4, and C7-T1
on the left. The C3-4 facet is fused.

[Series 1: ortho standard · 1 of 1 slices shown (1 of 4)]
[im 1/1]
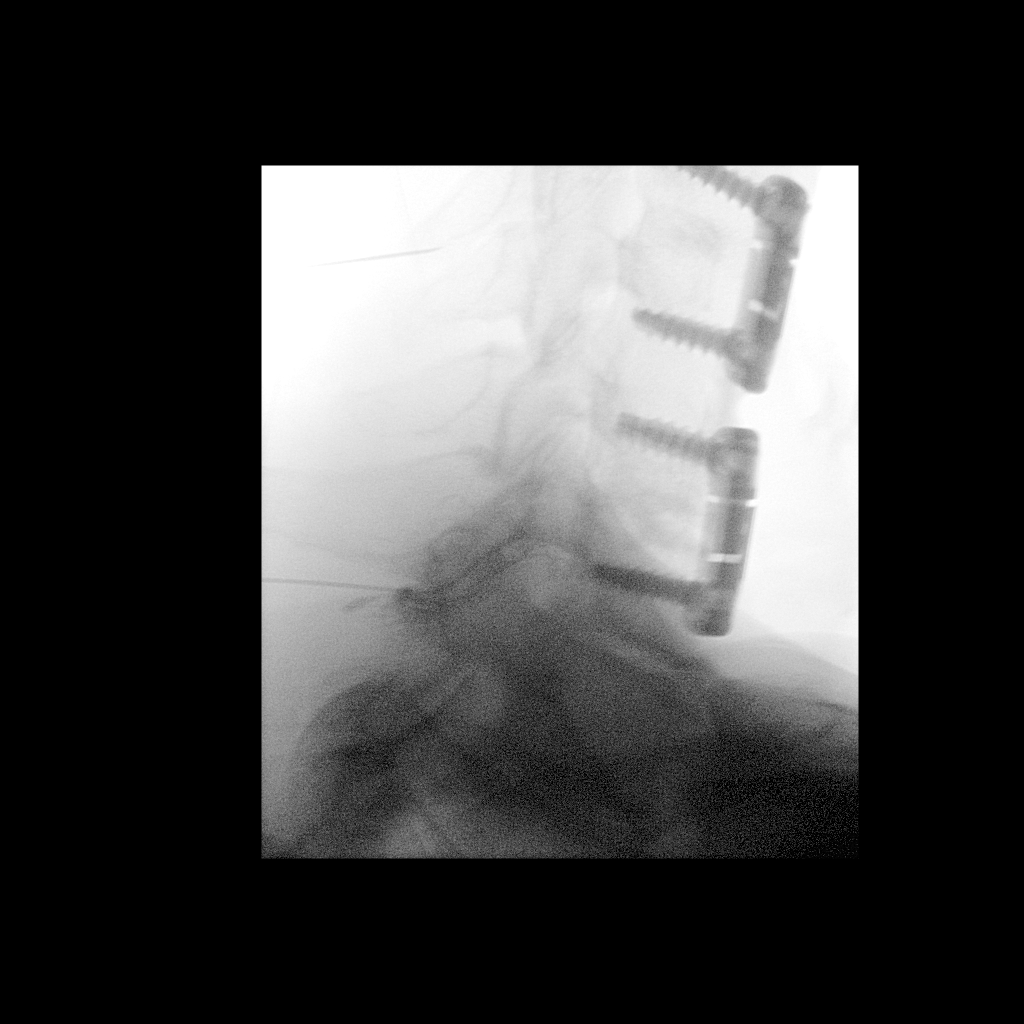

[Series 2: ortho standard · 1 of 1 slices shown (2 of 4)]
[im 1/1]
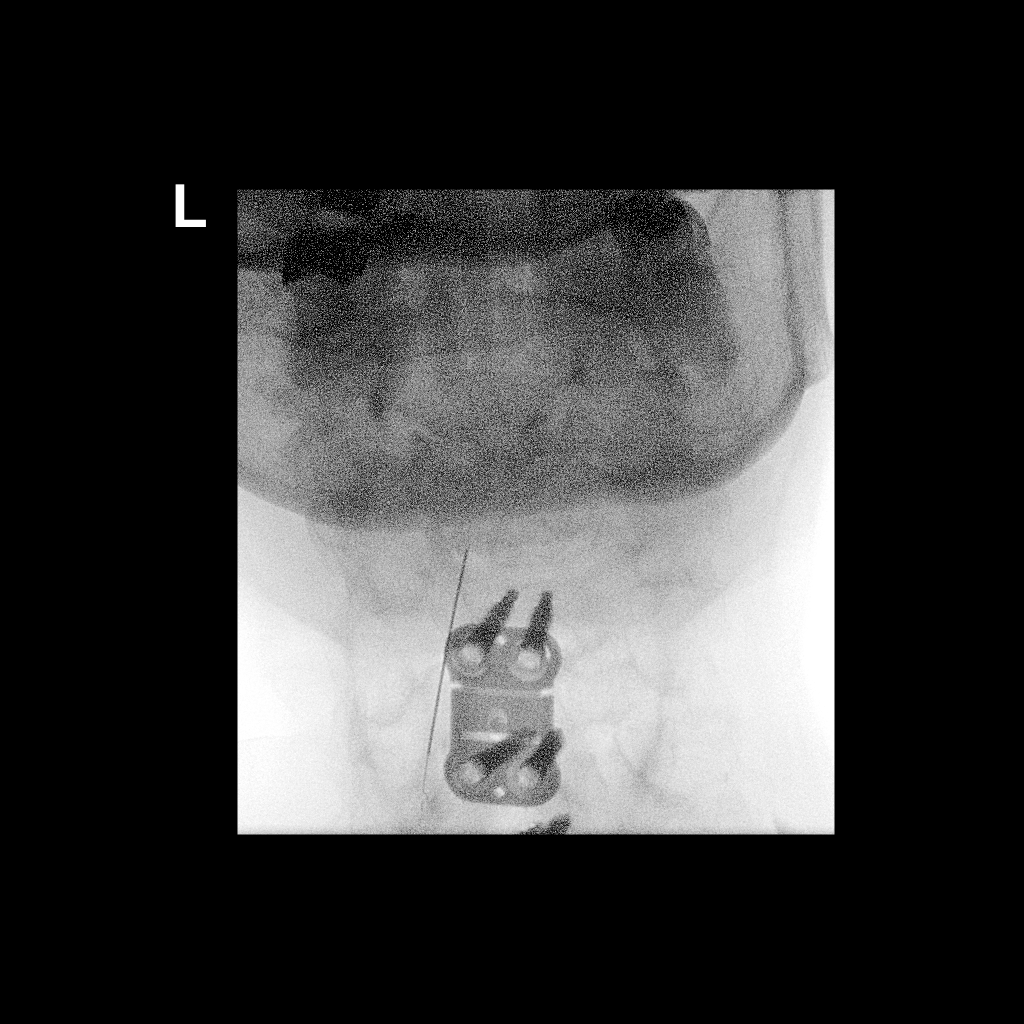

[Series 3: ortho standard · 1 of 1 slices shown (3 of 4)]
[im 1/1]
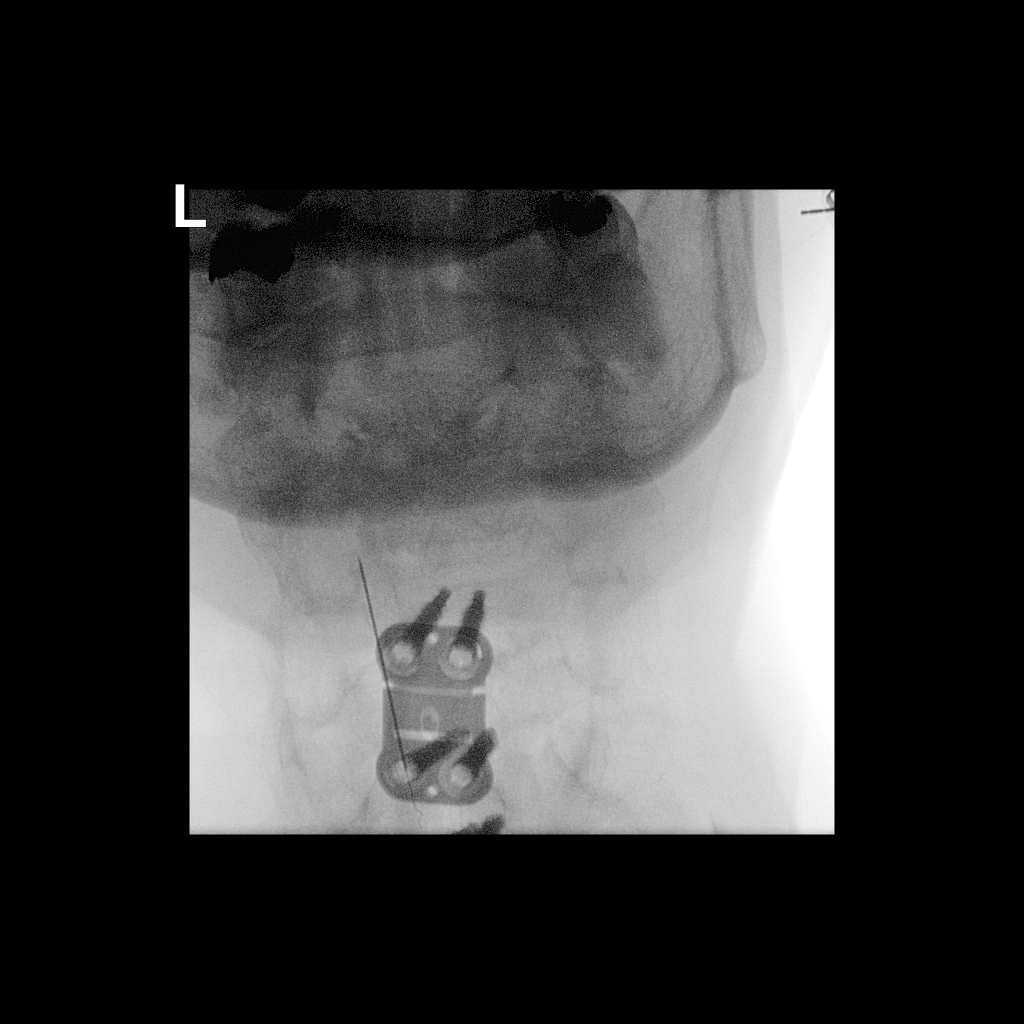

[Series 4: ortho standard · 1 of 1 slices shown (4 of 4)]
[im 1/1]
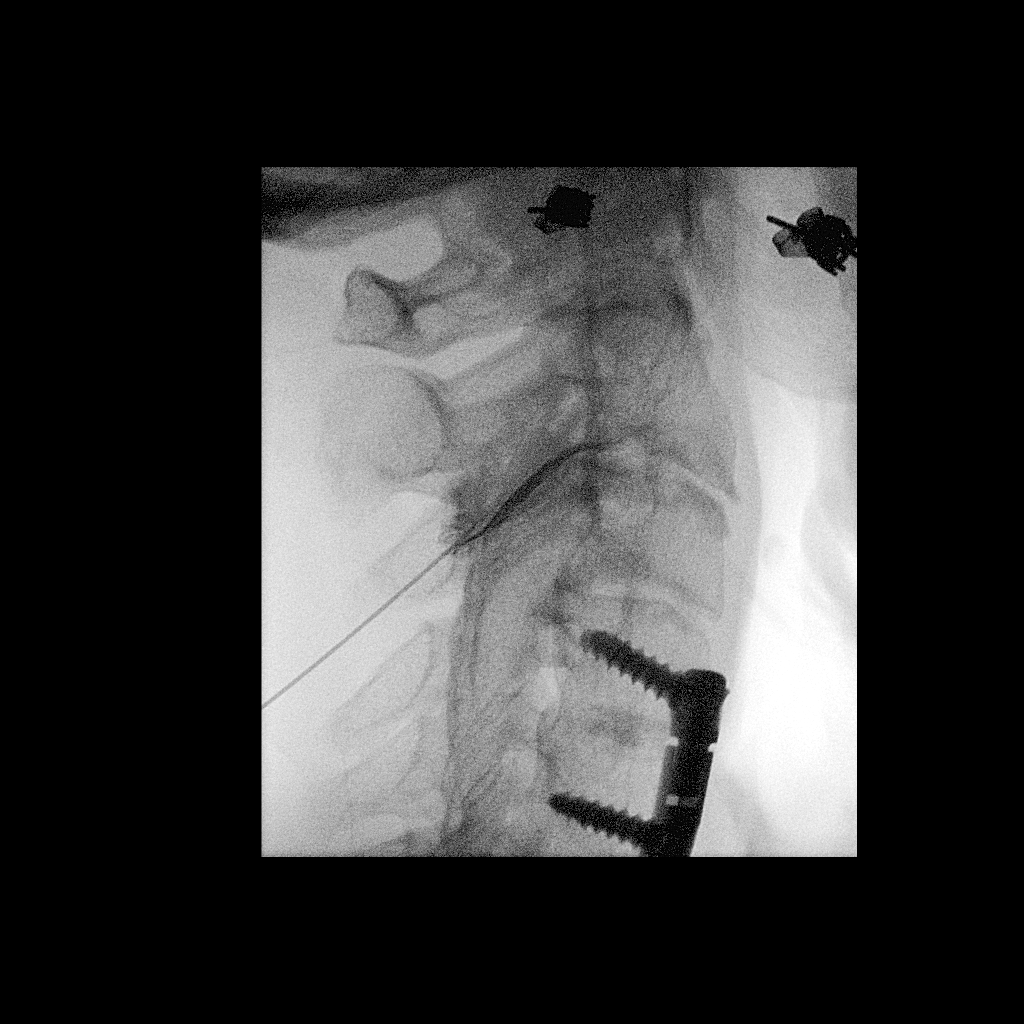

[4 of 4 positions shown; findings below may reference images not displayed]

EXAM:
Left C3-4 CERVICAL FACET INJECTION

A posterior approach was taken to the facet on the left at C3-4
using a 2 inch 25 gauge spinal needle. Intra-articular positioning
was confirmed by injecting a drop of Isovue-M 300. No vascular
opacification is seen. 0.3 mL of SASSON in 0.2 mL of
lidocaine was then injected.

Left C7-T1 CERVICAL FACET INJECTION

A posterior approach was taken to the facet on the left at C7-T1
using a 2 inch 25 gauge spinal needle. Intra-articular positioning
was confirmed by injecting a drop of Isovue-M 300. No vascular
opacification is seen. 0.3 mL of SASSON in 0.2 mL of
lidocaine was then injected.

The procedure was well-tolerated. The patient was discharged twenty
minutes following the injection in good condition.

FLUOROSCOPY TIME:  Radiation Exposure Index (as provided by the
fluoroscopic device): 26.93 uGy*m2
IMPRESSION: Technically successful left C2-3 and C7-T1 facet injections.

## 2020-10-06 IMAGING — XA DG FACET JT INJ L OR S SPINE SECOND LEVEL UNILATERAL
4 series · 4 of 4 positions shown · non-contrast
Comparison: none

CLINICAL DATA: Cervical spondylosis. Positional neck pain. Facet
arthropathy. Request for facet injections at C2-3, C3-4, and C7-T1
on the left. The C3-4 facet is fused.

[Series 1: ortho standard · 1 of 1 slices shown (1 of 4)]
[im 1/1]
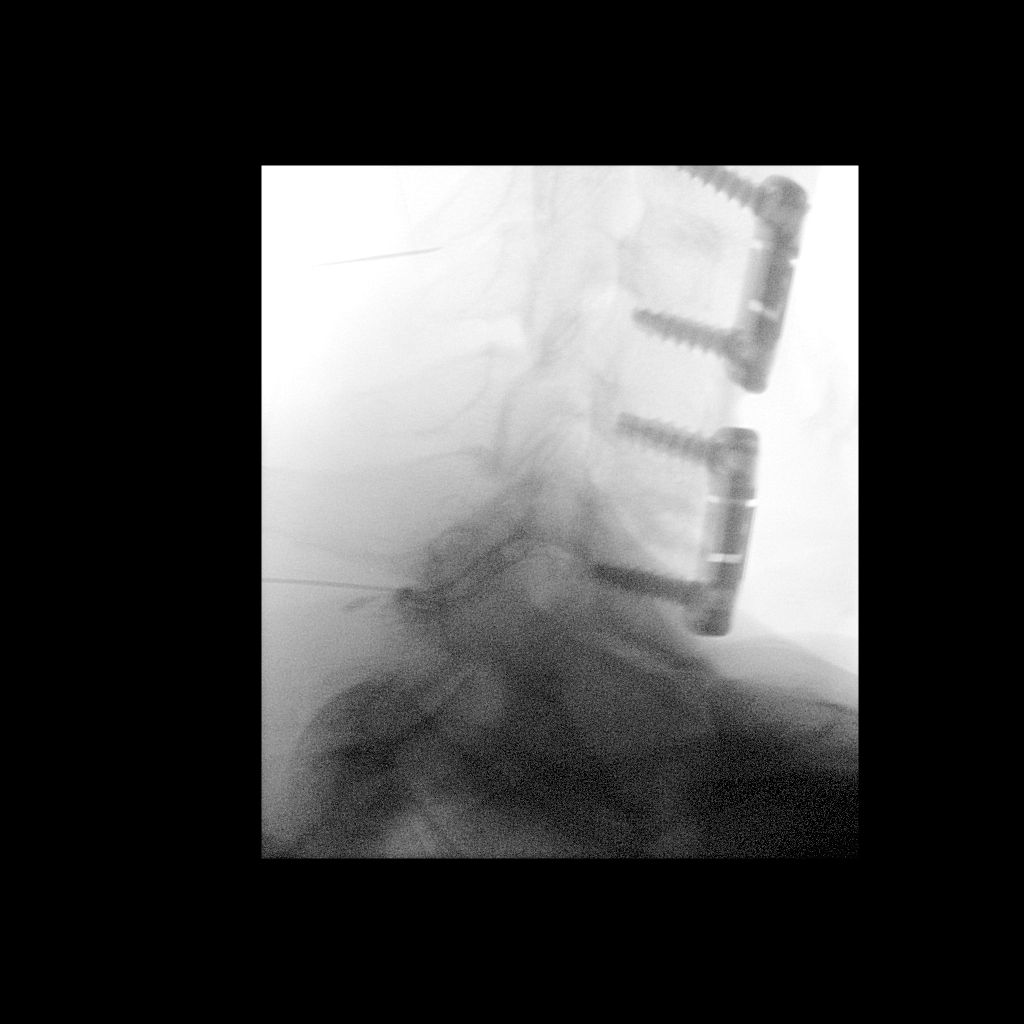

[Series 2: ortho standard · 1 of 1 slices shown (2 of 4)]
[im 1/1]
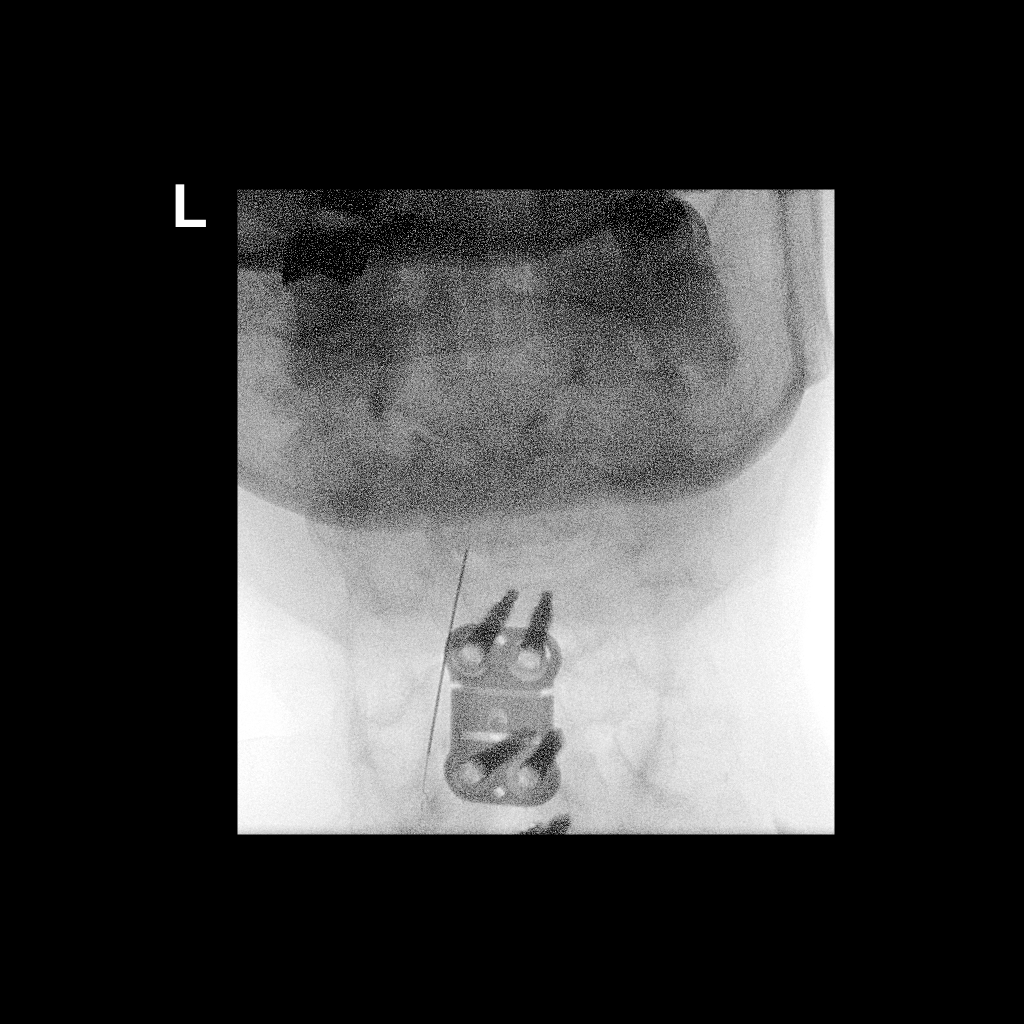

[Series 3: ortho standard · 1 of 1 slices shown (3 of 4)]
[im 1/1]
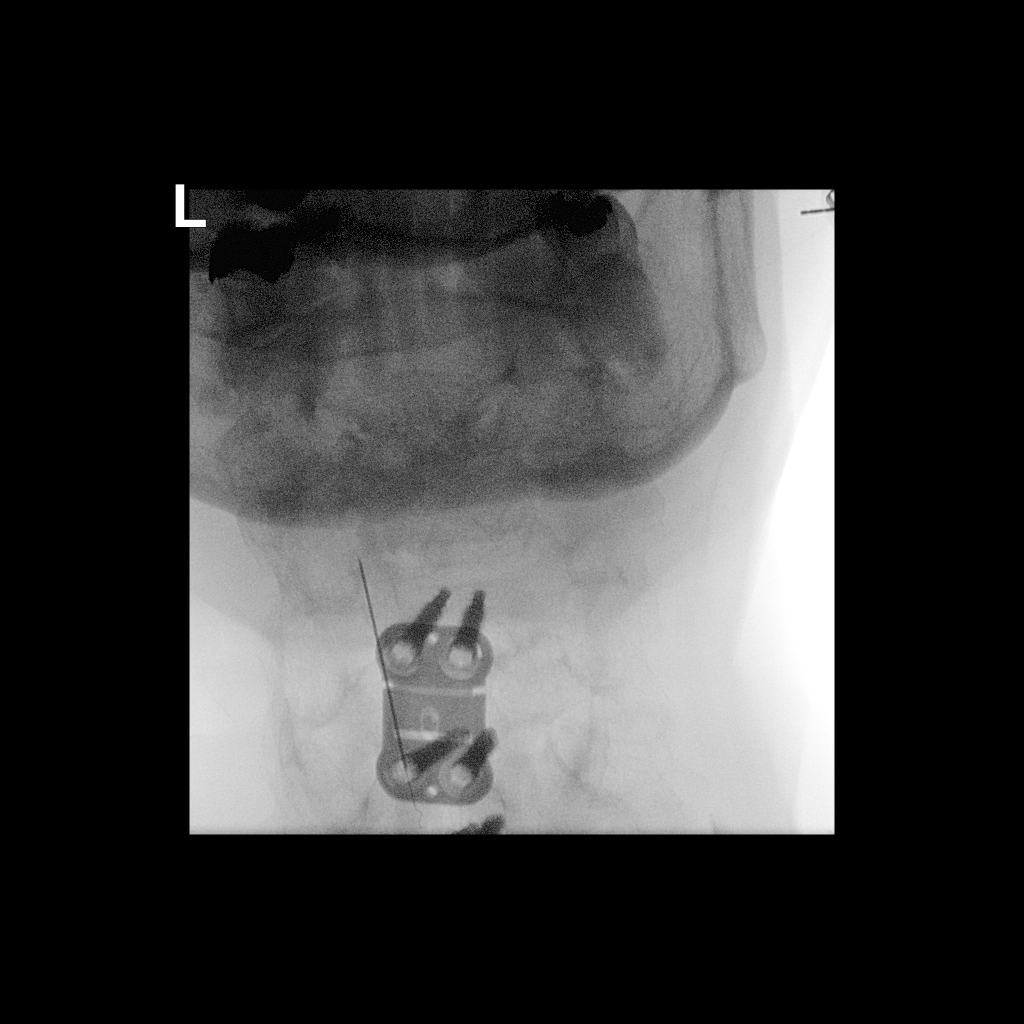

[Series 4: ortho standard · 1 of 1 slices shown (4 of 4)]
[im 1/1]
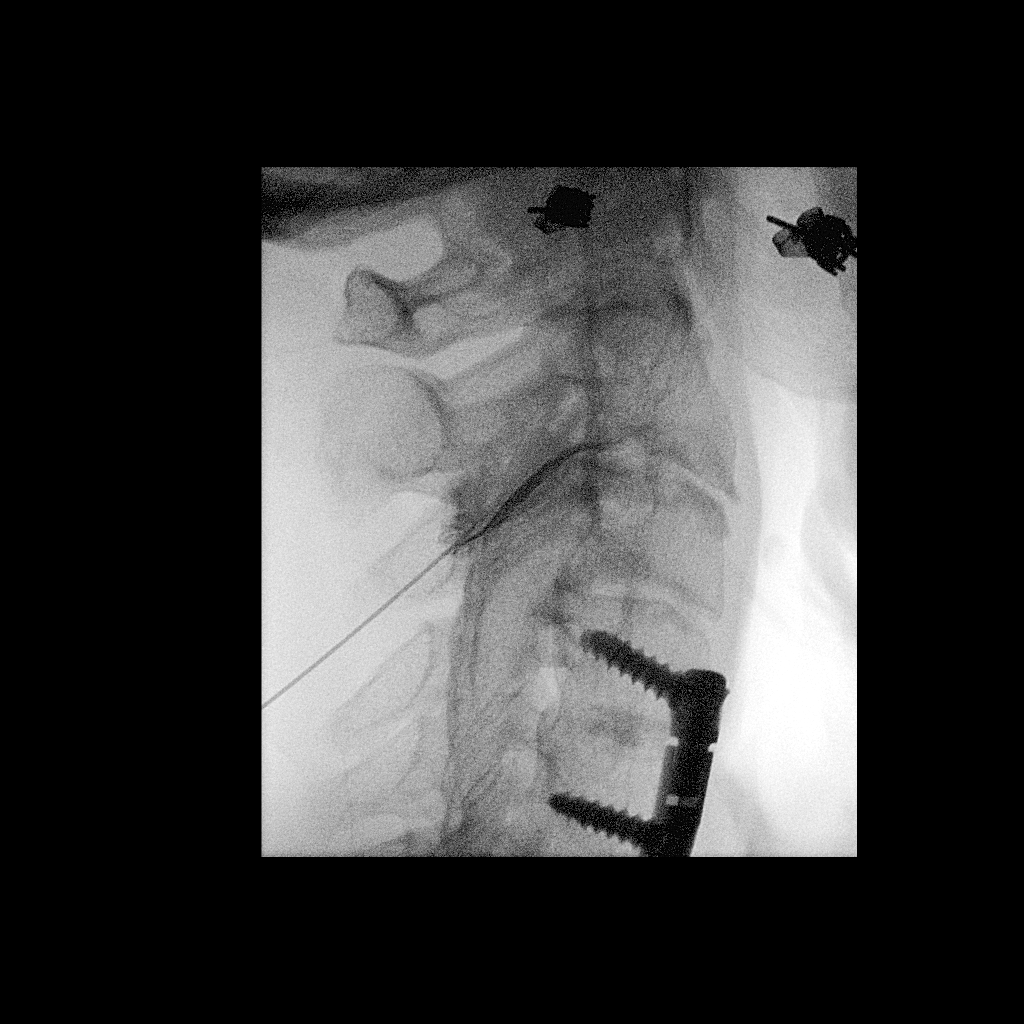

[4 of 4 positions shown; findings below may reference images not displayed]

EXAM:
Left C3-4 CERVICAL FACET INJECTION

A posterior approach was taken to the facet on the left at C3-4
using a 2 inch 25 gauge spinal needle. Intra-articular positioning
was confirmed by injecting a drop of Isovue-M 300. No vascular
opacification is seen. 0.3 mL of SASSON in 0.2 mL of
lidocaine was then injected.

Left C7-T1 CERVICAL FACET INJECTION

A posterior approach was taken to the facet on the left at C7-T1
using a 2 inch 25 gauge spinal needle. Intra-articular positioning
was confirmed by injecting a drop of Isovue-M 300. No vascular
opacification is seen. 0.3 mL of SASSON in 0.2 mL of
lidocaine was then injected.

The procedure was well-tolerated. The patient was discharged twenty
minutes following the injection in good condition.

FLUOROSCOPY TIME:  Radiation Exposure Index (as provided by the
fluoroscopic device): 26.93 uGy*m2
IMPRESSION: Technically successful left C2-3 and C7-T1 facet injections.

## 2020-10-06 MED ORDER — DEXAMETHASONE SODIUM PHOSPHATE 4 MG/ML IJ SOLN
6.0000 mg | Freq: Once | INTRAMUSCULAR | Status: AC
  Administered 2020-10-06: 6 mg via INTRA_ARTICULAR

## 2020-10-06 MED ORDER — IOPAMIDOL (ISOVUE-M 200) INJECTION 41%
1.0000 mL | Freq: Once | INTRAMUSCULAR | Status: AC
  Administered 2020-10-06: 1 mL via INTRA_ARTICULAR

## 2020-10-06 NOTE — Discharge Instructions (Signed)

## 2020-10-10 ENCOUNTER — Other Ambulatory Visit (HOSPITAL_BASED_OUTPATIENT_CLINIC_OR_DEPARTMENT_OTHER): Payer: Self-pay

## 2020-10-13 ENCOUNTER — Telehealth: Payer: Self-pay | Admitting: Podiatry

## 2020-10-13 ENCOUNTER — Other Ambulatory Visit (HOSPITAL_BASED_OUTPATIENT_CLINIC_OR_DEPARTMENT_OTHER): Payer: Self-pay

## 2020-10-13 MED ORDER — MELOXICAM 15 MG PO TABS
ORAL_TABLET | Freq: Every day | ORAL | 1 refills | Status: DC
  Filled 2020-10-13: qty 30, 30d supply, fill #0

## 2020-10-13 NOTE — Telephone Encounter (Signed)
Pt left a message stating her R great toe got caught on the carpet and the nail came off. She would like to know what to do in the meantime. I left a hear voice message letting her know she needs to schedule an appt to see you. Please advise.

## 2020-10-14 DIAGNOSIS — J3081 Allergic rhinitis due to animal (cat) (dog) hair and dander: Secondary | ICD-10-CM | POA: Diagnosis not present

## 2020-10-14 DIAGNOSIS — J301 Allergic rhinitis due to pollen: Secondary | ICD-10-CM | POA: Diagnosis not present

## 2020-10-14 DIAGNOSIS — J3089 Other allergic rhinitis: Secondary | ICD-10-CM | POA: Diagnosis not present

## 2020-10-17 ENCOUNTER — Other Ambulatory Visit: Payer: Self-pay

## 2020-10-17 ENCOUNTER — Ambulatory Visit (INDEPENDENT_AMBULATORY_CARE_PROVIDER_SITE_OTHER): Payer: Medicare PPO | Admitting: Podiatry

## 2020-10-17 ENCOUNTER — Encounter: Payer: Self-pay | Admitting: Podiatry

## 2020-10-17 DIAGNOSIS — M79674 Pain in right toe(s): Secondary | ICD-10-CM | POA: Diagnosis not present

## 2020-10-17 DIAGNOSIS — G8929 Other chronic pain: Secondary | ICD-10-CM | POA: Diagnosis not present

## 2020-10-17 DIAGNOSIS — S91209A Unspecified open wound of unspecified toe(s) with damage to nail, initial encounter: Secondary | ICD-10-CM | POA: Diagnosis not present

## 2020-10-17 NOTE — Patient Instructions (Signed)

## 2020-10-20 NOTE — Progress Notes (Signed)
Subjective: 70 year old female presents the office today for evaluation after right toenail injury.  She states that she got off the carpet and her nail got caught and lifted up.  She is been soaking in Epson salts and keeping a small amount of antibiotic ointment on the area is still attached and tender to the base of the nail. Denies any systemic complaints such as fevers, chills, nausea, vomiting. No acute changes since last appointment, and no other complaints at this time.   Objective: AAO x3, NAD DP/PT pulses palpable bilaterally, CRT less than 3 seconds Right hallux toenail is very loose and only The Proximal Medial Nail Fold.  Granular Wound Present underneath the Toenail.  Some Macerated Tissue Is Also Noted.  There Is No Edema, Ascending Cellulitis.  No Fluctuation Crepitation.  No Malodor.  No open lesions or pre-ulcerative lesions.  No pain with calf compression, swelling, warmth, erythema  Assessment: Right hallux traumatic nail avulsion  Plan: -All treatment options discussed with the patient including all alternatives, risks, complications.  -At this time, recommended total nail removal without chemical matricectomy to the right  due to infection. Risks and complications were discussed with the patient for which they understand and  verbally consent to the procedure. Under sterile conditions a total of 3 mL of a mixture of 2% lidocaine plain and 0.5% Marcaine plain was infiltrated in a hallux block fashion. Once anesthetized, the skin was prepped in sterile fashion. A tourniquet was then applied. Next the right hallux nail was sharply excised making sure to remove the entire offending nail border. Once the nail was  Removed, the area was debrided and the underlying skin was intact. The area was irrigated and hemostasis was obtained.  A dry sterile dressing was applied. After application of the dressing the tourniquet was removed and there is found to be an immediate capillary refill time  to the digit. The patient tolerated the procedure well any complications. Post procedure instructions were discussed the patient for which he verbally understood. Follow-up in one week for nail check or sooner if any problems are to arise. Discussed signs/symptoms of worsening infection and directed to call the office immediately should any occur or go directly to the emergency room. In the meantime, encouraged to call the office with any questions, concerns, changes symptoms. -Patient encouraged to call the office with any questions, concerns, change in symptoms.   Trula Slade DPM

## 2020-10-23 ENCOUNTER — Other Ambulatory Visit (HOSPITAL_BASED_OUTPATIENT_CLINIC_OR_DEPARTMENT_OTHER): Payer: Self-pay

## 2020-10-23 ENCOUNTER — Other Ambulatory Visit: Payer: Self-pay | Admitting: Family Medicine

## 2020-10-23 MED ORDER — BUPROPION HCL ER (XL) 150 MG PO TB24
ORAL_TABLET | Freq: Every morning | ORAL | 1 refills | Status: DC
  Filled 2020-10-23: qty 90, 30d supply, fill #0
  Filled 2020-11-26: qty 90, 30d supply, fill #1

## 2020-10-24 DIAGNOSIS — R159 Full incontinence of feces: Secondary | ICD-10-CM | POA: Diagnosis not present

## 2020-10-24 DIAGNOSIS — N3941 Urge incontinence: Secondary | ICD-10-CM | POA: Diagnosis not present

## 2020-10-27 ENCOUNTER — Encounter: Payer: Self-pay | Admitting: Family Medicine

## 2020-10-27 ENCOUNTER — Other Ambulatory Visit (HOSPITAL_BASED_OUTPATIENT_CLINIC_OR_DEPARTMENT_OTHER): Payer: Self-pay

## 2020-10-27 DIAGNOSIS — M797 Fibromyalgia: Secondary | ICD-10-CM

## 2020-10-27 MED ORDER — METHOCARBAMOL 500 MG PO TABS
ORAL_TABLET | ORAL | 1 refills | Status: DC
  Filled 2020-10-27: qty 30, 15d supply, fill #0
  Filled 2020-11-18: qty 30, 15d supply, fill #1

## 2020-10-27 NOTE — Telephone Encounter (Signed)
Meds ordered this encounter  Medications   methocarbamol (ROBAXIN) 500 MG tablet    Sig: TAKE 1 TABLET (500 MG TOTAL) BY MOUTH 2 (TWO) TIMES DAILY AS NEEDED FOR MUSCLE SPASMS.    Dispense:  30 tablet    Refill:  1

## 2020-10-30 ENCOUNTER — Other Ambulatory Visit (HOSPITAL_BASED_OUTPATIENT_CLINIC_OR_DEPARTMENT_OTHER): Payer: Self-pay

## 2020-10-30 ENCOUNTER — Telehealth: Payer: Self-pay | Admitting: Podiatry

## 2020-10-30 MED FILL — Famotidine Tab 20 MG: ORAL | 90 days supply | Qty: 90 | Fill #1 | Status: AC

## 2020-10-30 MED FILL — Gabapentin Tab 600 MG: ORAL | 30 days supply | Qty: 135 | Fill #3 | Status: AC

## 2020-10-30 NOTE — Telephone Encounter (Signed)
Patient called and stated that she is unable to come to her appointment tomorrow. I wanted to know if it was ok to schedule her for 09/09 at 11:45 for her nail check.

## 2020-10-31 ENCOUNTER — Other Ambulatory Visit (HOSPITAL_BASED_OUTPATIENT_CLINIC_OR_DEPARTMENT_OTHER): Payer: Self-pay

## 2020-10-31 ENCOUNTER — Ambulatory Visit: Payer: Medicare PPO | Admitting: Podiatry

## 2020-10-31 MED ORDER — FINASTERIDE 5 MG PO TABS
ORAL_TABLET | ORAL | 5 refills | Status: DC
  Filled 2020-10-31: qty 30, 30d supply, fill #0
  Filled 2020-12-02: qty 30, 30d supply, fill #1
  Filled 2021-01-02: qty 30, 30d supply, fill #2
  Filled 2021-02-02: qty 30, 30d supply, fill #3
  Filled 2021-03-12: qty 30, 30d supply, fill #4
  Filled 2021-04-16: qty 30, 30d supply, fill #5

## 2020-11-04 ENCOUNTER — Encounter: Payer: Self-pay | Admitting: Family Medicine

## 2020-11-04 ENCOUNTER — Other Ambulatory Visit (HOSPITAL_BASED_OUTPATIENT_CLINIC_OR_DEPARTMENT_OTHER): Payer: Self-pay

## 2020-11-04 ENCOUNTER — Ambulatory Visit (INDEPENDENT_AMBULATORY_CARE_PROVIDER_SITE_OTHER): Payer: Medicare PPO | Admitting: Family Medicine

## 2020-11-04 ENCOUNTER — Other Ambulatory Visit: Payer: Self-pay

## 2020-11-04 VITALS — BP 117/48 | HR 97 | Temp 98.1°F | Ht 66.0 in | Wt 124.0 lb

## 2020-11-04 DIAGNOSIS — M5432 Sciatica, left side: Secondary | ICD-10-CM | POA: Diagnosis not present

## 2020-11-04 DIAGNOSIS — M797 Fibromyalgia: Secondary | ICD-10-CM | POA: Diagnosis not present

## 2020-11-04 DIAGNOSIS — J3081 Allergic rhinitis due to animal (cat) (dog) hair and dander: Secondary | ICD-10-CM | POA: Diagnosis not present

## 2020-11-04 DIAGNOSIS — E039 Hypothyroidism, unspecified: Secondary | ICD-10-CM | POA: Diagnosis not present

## 2020-11-04 DIAGNOSIS — J301 Allergic rhinitis due to pollen: Secondary | ICD-10-CM | POA: Diagnosis not present

## 2020-11-04 DIAGNOSIS — J3089 Other allergic rhinitis: Secondary | ICD-10-CM | POA: Diagnosis not present

## 2020-11-04 MED ORDER — MELOXICAM 15 MG PO TABS
ORAL_TABLET | Freq: Every day | ORAL | 2 refills | Status: DC
  Filled 2020-11-04: qty 30, 30d supply, fill #0
  Filled 2020-12-11: qty 30, 30d supply, fill #1
  Filled 2021-01-08: qty 30, 30d supply, fill #2

## 2020-11-04 NOTE — Assessment & Plan Note (Signed)
She still has some methocarbamol on hand.  Using tramadol very sparingly we have not sent a prescription since May.  Also uses her meloxicam regularly we will go ahead and refill that today uses as needed Tylenol.
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
## 2020-11-04 NOTE — Assessment & Plan Note (Signed)
Asymptomatic.  Plan to recheck TSH at her convenience.

## 2020-11-05 ENCOUNTER — Other Ambulatory Visit (HOSPITAL_BASED_OUTPATIENT_CLINIC_OR_DEPARTMENT_OTHER): Payer: Self-pay

## 2020-11-05 LAB — BASIC METABOLIC PANEL WITH GFR
BUN: 20 mg/dL (ref 7–25)
CO2: 33 mmol/L — ABNORMAL HIGH (ref 20–32)
Calcium: 10.1 mg/dL (ref 8.6–10.4)
Chloride: 102 mmol/L (ref 98–110)
Creat: 0.89 mg/dL (ref 0.60–1.00)
Glucose, Bld: 79 mg/dL (ref 65–99)
Potassium: 4.3 mmol/L (ref 3.5–5.3)
Sodium: 142 mmol/L (ref 135–146)
eGFR: 69 mL/min/{1.73_m2} (ref >=60)

## 2020-11-05 LAB — TSH: TSH: 2.48 mIU/L (ref 0.40–4.50)

## 2020-11-05 NOTE — Progress Notes (Signed)
Your lab work is within acceptable range and there are no concerning findings.   ?

## 2020-11-06 ENCOUNTER — Other Ambulatory Visit (HOSPITAL_BASED_OUTPATIENT_CLINIC_OR_DEPARTMENT_OTHER): Payer: Self-pay

## 2020-11-10 ENCOUNTER — Other Ambulatory Visit: Payer: Self-pay | Admitting: Family Medicine

## 2020-11-10 ENCOUNTER — Other Ambulatory Visit (HOSPITAL_BASED_OUTPATIENT_CLINIC_OR_DEPARTMENT_OTHER): Payer: Self-pay

## 2020-11-11 ENCOUNTER — Telehealth: Payer: Self-pay

## 2020-11-11 ENCOUNTER — Other Ambulatory Visit (HOSPITAL_BASED_OUTPATIENT_CLINIC_OR_DEPARTMENT_OTHER): Payer: Self-pay

## 2020-11-11 MED ORDER — TRAMADOL HCL 50 MG PO TABS
ORAL_TABLET | ORAL | 2 refills | Status: DC
  Filled 2020-11-11: qty 60, 10d supply, fill #0
  Filled 2020-12-11: qty 60, 10d supply, fill #1
  Filled 2021-01-08: qty 60, 10d supply, fill #2

## 2020-11-11 NOTE — Telephone Encounter (Signed)
Patient left a msgg stating she was having some sciatica issue and would like an appt with Dr. Darene Lamer. Please call patient to schedule.

## 2020-11-11 NOTE — Telephone Encounter (Signed)
Patient has been scheduled for next available which is next Tuesday, Sept 6th. AM

## 2020-11-14 ENCOUNTER — Other Ambulatory Visit: Payer: Self-pay

## 2020-11-14 ENCOUNTER — Ambulatory Visit (INDEPENDENT_AMBULATORY_CARE_PROVIDER_SITE_OTHER): Payer: Medicare PPO | Admitting: Sports Medicine

## 2020-11-14 ENCOUNTER — Other Ambulatory Visit (HOSPITAL_BASED_OUTPATIENT_CLINIC_OR_DEPARTMENT_OTHER): Payer: Self-pay

## 2020-11-14 DIAGNOSIS — M5416 Radiculopathy, lumbar region: Secondary | ICD-10-CM

## 2020-11-14 MED ORDER — PREDNISONE 50 MG PO TABS
ORAL_TABLET | ORAL | 0 refills | Status: DC
Start: 2020-11-14 — End: 2021-01-07
  Filled 2020-11-14: qty 5, 5d supply, fill #0

## 2020-11-14 NOTE — Progress Notes (Signed)
    Procedures performed today:    None.  Independent interpretation of notes and tests performed by another provider:   None.  Brief History, Exam, Impression, and Recommendations:    Left lumbar radiculitis Marylin is a pleasant 70 year old female, known chronic lumbar DDD, x-rays in the past showed L5-S1 loss of disc height. She was doing some stretching recently, about 4 weeks ago and felt a pop in her back with radiation down the left leg to the outside of the left thigh, left lower leg but not quite to the foot, no progressive weakness, no other red flag symptoms. She is already taking Neurontin, methocarbamol, we will add findings of prednisone, x-rays, declines formal PT so I will have her do some home conditioning, return to see me in 6 weeks, MRI for interventional planning if no better.  Chronic process with exacerbation and pharmacologic treatment   ___________________________________________ Gwen Her. Dianah Field, M.D., ABFM., CAQSM. Primary Care and [employer] Instructor of [place] [employer] [facility] [employer]

## 2020-11-14 NOTE — Assessment & Plan Note (Signed)
Robin Conrad is a pleasant 70 year old female, known chronic lumbar DDD, x-rays in the past showed L5-S1 loss of disc height. She was doing some stretching recently, about 4 weeks ago and felt a pop in her back with radiation down the left leg to the outside of the left thigh, left lower leg but not quite to the foot, no progressive weakness, no other red flag symptoms. She is already taking Neurontin, methocarbamol, we will add findings of prednisone, x-rays, declines formal PT so I will have her do some home conditioning, return to see me in 6 weeks, MRI for interventional planning if no better.

## 2020-11-18 ENCOUNTER — Ambulatory Visit: Payer: Medicare PPO | Admitting: Sports Medicine

## 2020-11-18 ENCOUNTER — Other Ambulatory Visit (HOSPITAL_BASED_OUTPATIENT_CLINIC_OR_DEPARTMENT_OTHER): Payer: Self-pay

## 2020-11-18 DIAGNOSIS — J3081 Allergic rhinitis due to animal (cat) (dog) hair and dander: Secondary | ICD-10-CM | POA: Diagnosis not present

## 2020-11-18 DIAGNOSIS — J3089 Other allergic rhinitis: Secondary | ICD-10-CM | POA: Diagnosis not present

## 2020-11-18 DIAGNOSIS — J301 Allergic rhinitis due to pollen: Secondary | ICD-10-CM | POA: Diagnosis not present

## 2020-11-19 ENCOUNTER — Ambulatory Visit (INDEPENDENT_AMBULATORY_CARE_PROVIDER_SITE_OTHER): Payer: Medicare PPO

## 2020-11-19 ENCOUNTER — Other Ambulatory Visit: Payer: Self-pay

## 2020-11-19 DIAGNOSIS — M5416 Radiculopathy, lumbar region: Secondary | ICD-10-CM | POA: Diagnosis not present

## 2020-11-19 DIAGNOSIS — M545 Low back pain, unspecified: Secondary | ICD-10-CM | POA: Diagnosis not present

## 2020-11-19 IMAGING — DX DG LUMBAR SPINE COMPLETE 4+V
6 series · 6 of 6 positions shown · non-contrast
Comparison: Lumbar radiograph [DATE]

CLINICAL DATA: Left lumbar radiculitis. Felt a pop 4 weeks ago.
Pain radiates down left leg.

EXAM:
LUMBAR SPINE - COMPLETE 4+ VIEW

[l-spine ap]
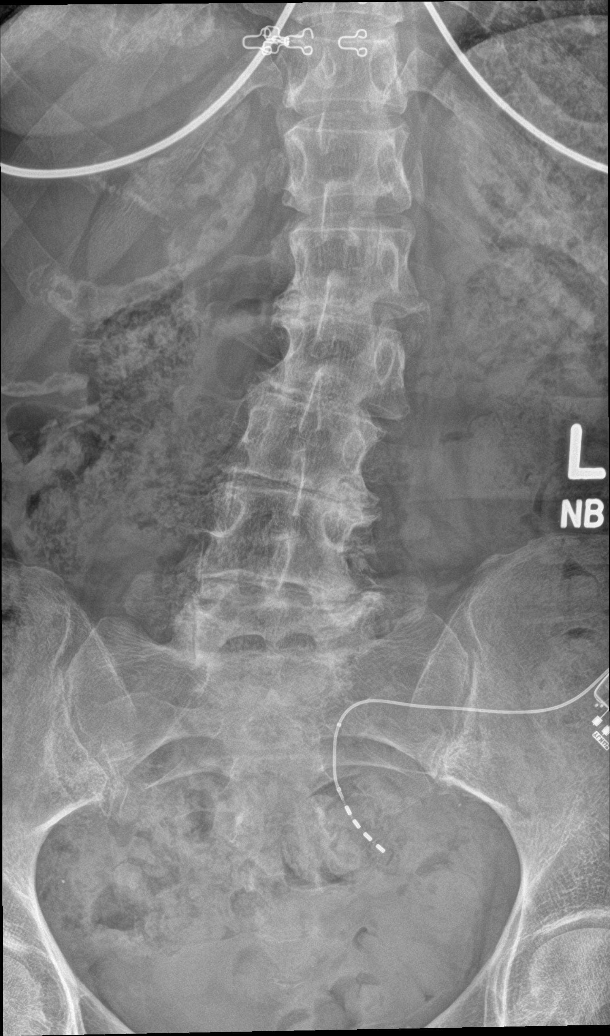

[l-spine obl (1 of 3)]
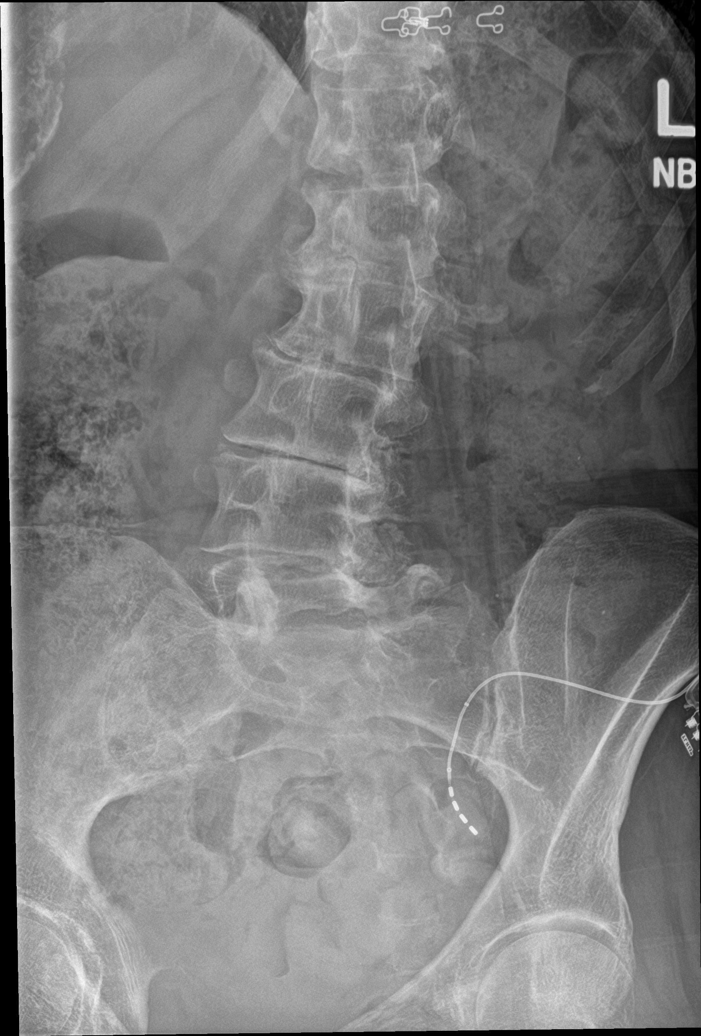

[l-spine obl (2 of 3)]
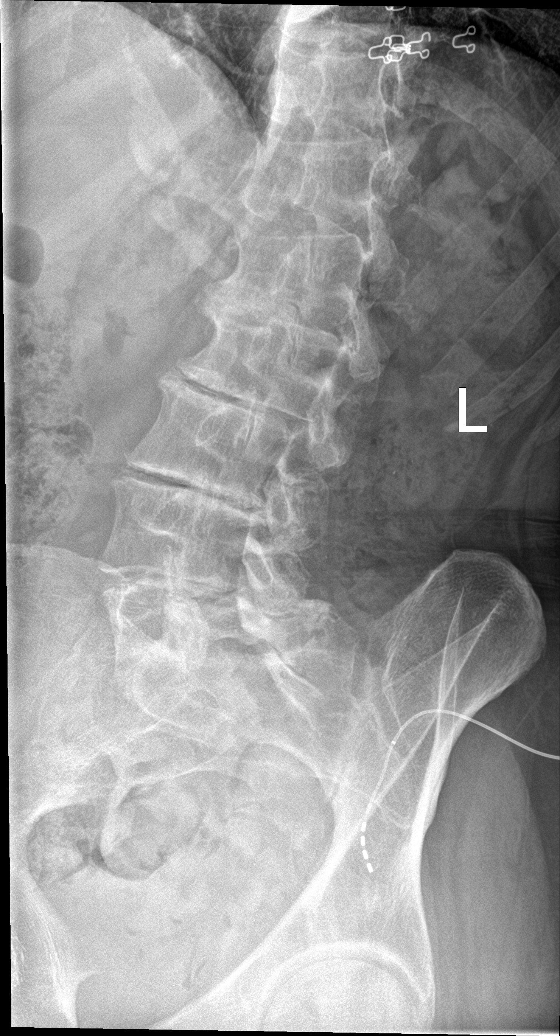

[l-spine lat]
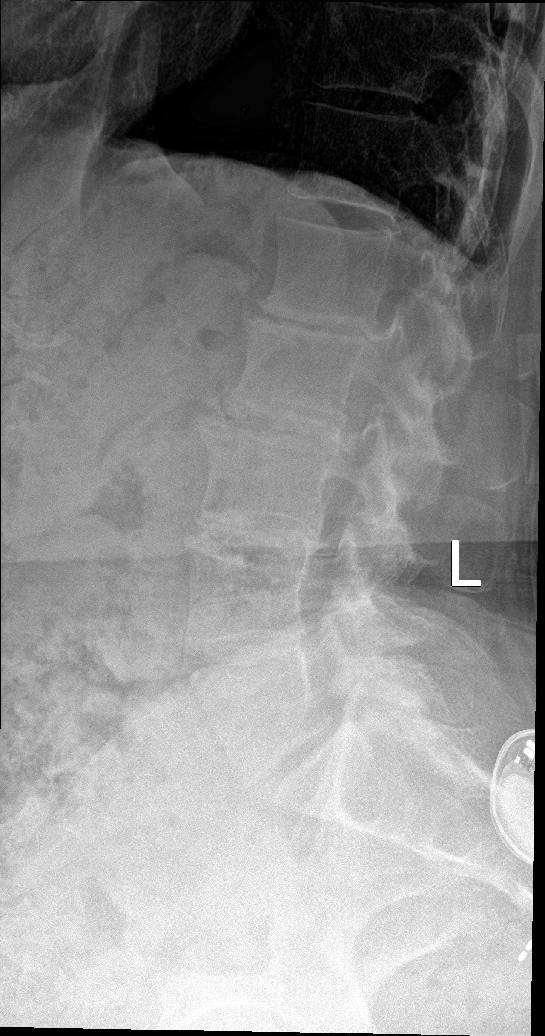

[l-spine spot]
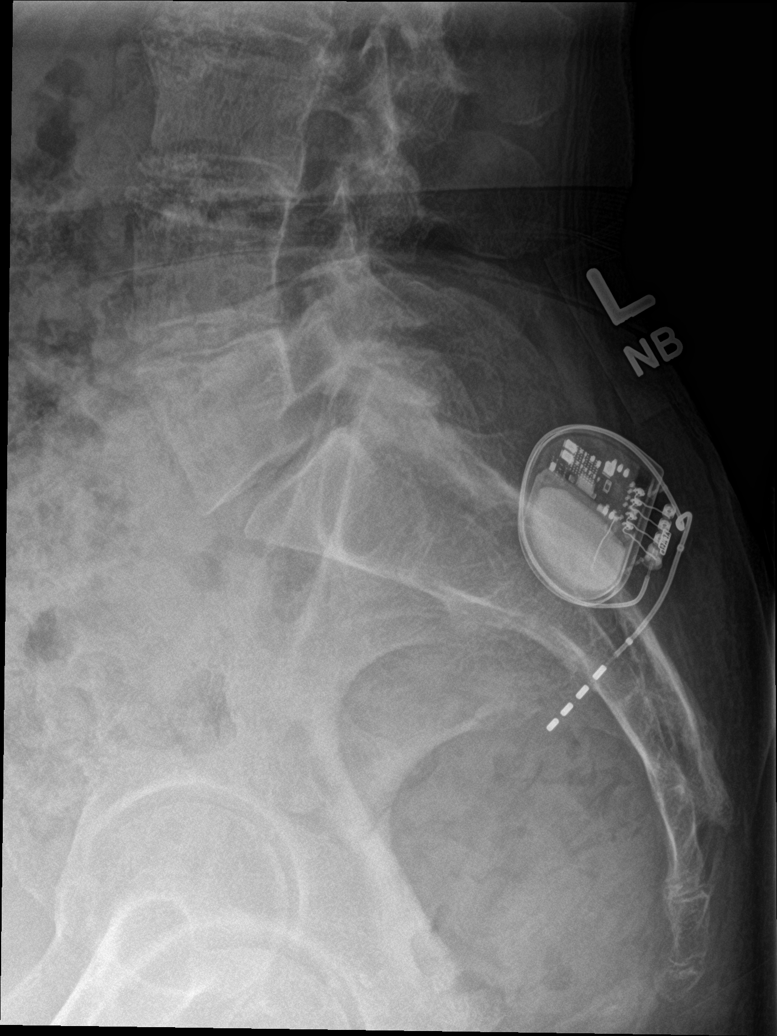

[l-spine obl (3 of 3)]
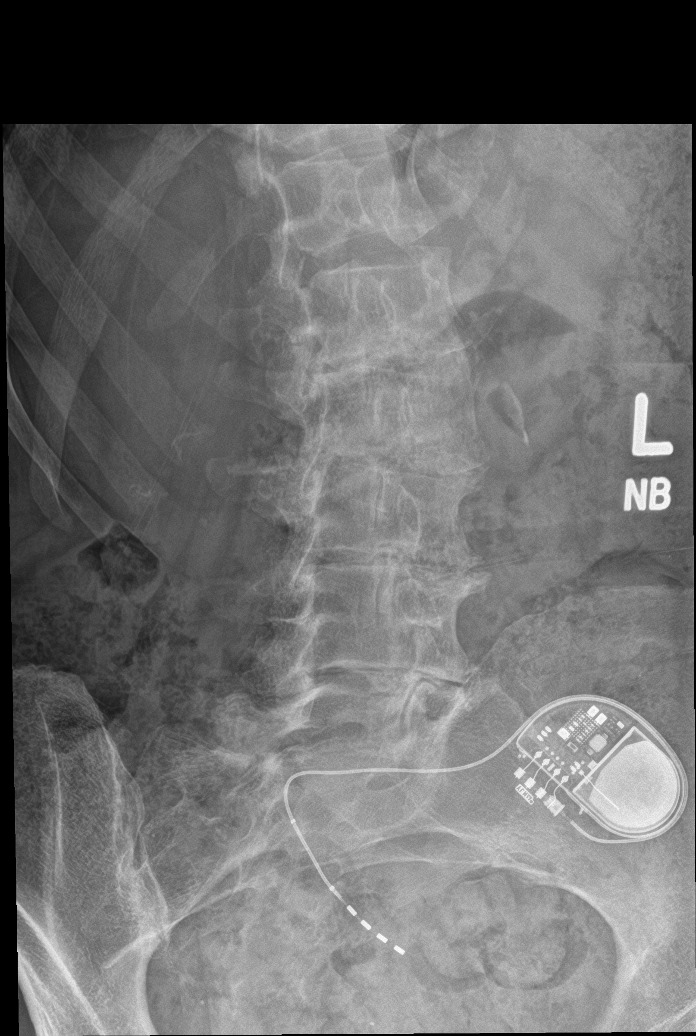

[6 of 6 positions shown; findings below may reference images not displayed]

FINDINGS: There are 5 lumbar type vertebra. Levo scoliotic curvature centered
at L1-L2 with slight progression from prior exam (currently 18
degrees previously 11 degrees from T12 through L3). 4 mm
anterolisthesis of L5 on S1, previously 3 mm. Disc space narrowing
and endplate spurring at L1-L2, L2-L3, L3-L4, and L5-S1. Slight
progression at L3-L4 from prior exam. Facet hypertrophy from L2-L3
through the lumbosacral junction, also slightly progressed. The
vertebral body heights are normal. No evidence of fracture or focal
bone abnormality. Presacral stimulator with battery pack on the left
as before.
IMPRESSION: 1. Multilevel degenerative disc disease and facet hypertrophy, with
slight progression from [6N] radiographs.
2. Levoconvex scoliotic curvature, progressed from prior exam. 1 mm
progression of anterolisthesis of L5 on S1.

## 2020-11-21 ENCOUNTER — Encounter: Payer: Self-pay | Admitting: Family Medicine

## 2020-11-21 DIAGNOSIS — M5416 Radiculopathy, lumbar region: Secondary | ICD-10-CM

## 2020-11-24 DIAGNOSIS — Z791 Long term (current) use of non-steroidal anti-inflammatories (NSAID): Secondary | ICD-10-CM | POA: Diagnosis not present

## 2020-11-24 DIAGNOSIS — M351 Other overlap syndromes: Secondary | ICD-10-CM | POA: Diagnosis not present

## 2020-11-24 DIAGNOSIS — M797 Fibromyalgia: Secondary | ICD-10-CM | POA: Diagnosis not present

## 2020-11-26 ENCOUNTER — Other Ambulatory Visit (HOSPITAL_BASED_OUTPATIENT_CLINIC_OR_DEPARTMENT_OTHER): Payer: Self-pay

## 2020-11-26 ENCOUNTER — Encounter: Payer: Self-pay | Admitting: Physical Therapy

## 2020-11-26 ENCOUNTER — Other Ambulatory Visit: Payer: Self-pay

## 2020-11-26 ENCOUNTER — Ambulatory Visit (INDEPENDENT_AMBULATORY_CARE_PROVIDER_SITE_OTHER): Payer: Medicare PPO | Admitting: Physical Therapy

## 2020-11-26 DIAGNOSIS — M6281 Muscle weakness (generalized): Secondary | ICD-10-CM | POA: Diagnosis not present

## 2020-11-26 DIAGNOSIS — G8929 Other chronic pain: Secondary | ICD-10-CM

## 2020-11-26 DIAGNOSIS — R29898 Other symptoms and signs involving the musculoskeletal system: Secondary | ICD-10-CM

## 2020-11-26 DIAGNOSIS — M5442 Lumbago with sciatica, left side: Secondary | ICD-10-CM | POA: Diagnosis not present

## 2020-11-26 NOTE — Patient Instructions (Signed)
Access Code: [email or web address] URL: [email or web address] Date: 11/26/2020 Prepared by: Isabelle Course  Exercises Seated Hamstring Stretch - 1 x daily - 7 x weekly - 3 sets - 1 reps - 20-30 seconds hold Seated Figure 4 Piriformis Stretch - 1 x daily - 7 x weekly - 3 sets - 1 reps - 20-30 sec hold Seated Transversus Abdominis Bracing - 1 x daily - 7 x weekly - 1 sets - 10 reps - 5 seconds hold
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
## 2020-11-28 ENCOUNTER — Other Ambulatory Visit: Payer: Self-pay

## 2020-11-28 ENCOUNTER — Encounter: Payer: Self-pay | Admitting: Podiatry

## 2020-11-28 ENCOUNTER — Ambulatory Visit (INDEPENDENT_AMBULATORY_CARE_PROVIDER_SITE_OTHER): Payer: Medicare PPO | Admitting: Podiatry

## 2020-11-28 DIAGNOSIS — L603 Nail dystrophy: Secondary | ICD-10-CM | POA: Diagnosis not present

## 2020-12-01 ENCOUNTER — Other Ambulatory Visit: Payer: Self-pay

## 2020-12-01 ENCOUNTER — Encounter: Payer: Self-pay | Admitting: Physical Therapy

## 2020-12-01 ENCOUNTER — Ambulatory Visit (INDEPENDENT_AMBULATORY_CARE_PROVIDER_SITE_OTHER): Payer: Medicare PPO | Admitting: Physical Therapy

## 2020-12-01 DIAGNOSIS — M6281 Muscle weakness (generalized): Secondary | ICD-10-CM

## 2020-12-01 DIAGNOSIS — G8929 Other chronic pain: Secondary | ICD-10-CM | POA: Diagnosis not present

## 2020-12-01 DIAGNOSIS — M5442 Lumbago with sciatica, left side: Secondary | ICD-10-CM

## 2020-12-01 DIAGNOSIS — R29898 Other symptoms and signs involving the musculoskeletal system: Secondary | ICD-10-CM | POA: Diagnosis not present
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
## 2020-12-02 ENCOUNTER — Other Ambulatory Visit: Payer: Self-pay | Admitting: Family Medicine

## 2020-12-02 ENCOUNTER — Other Ambulatory Visit (HOSPITAL_BASED_OUTPATIENT_CLINIC_OR_DEPARTMENT_OTHER): Payer: Self-pay

## 2020-12-02 MED ORDER — GABAPENTIN 600 MG PO TABS
ORAL_TABLET | ORAL | 5 refills | Status: DC
  Filled 2020-12-02: qty 135, 30d supply, fill #0
  Filled 2021-01-08: qty 135, 30d supply, fill #1
  Filled 2021-02-10: qty 135, 30d supply, fill #2
  Filled 2021-03-12: qty 135, 30d supply, fill #3
  Filled 2021-04-16: qty 135, 30d supply, fill #4
  Filled 2021-05-25: qty 135, 30d supply, fill #5

## 2020-12-03 ENCOUNTER — Ambulatory Visit (INDEPENDENT_AMBULATORY_CARE_PROVIDER_SITE_OTHER): Payer: Medicare PPO | Admitting: Physical Therapy

## 2020-12-03 ENCOUNTER — Other Ambulatory Visit: Payer: Self-pay

## 2020-12-03 ENCOUNTER — Telehealth: Payer: Self-pay | Admitting: Family Medicine

## 2020-12-03 DIAGNOSIS — M5442 Lumbago with sciatica, left side: Secondary | ICD-10-CM

## 2020-12-03 DIAGNOSIS — M6281 Muscle weakness (generalized): Secondary | ICD-10-CM | POA: Diagnosis not present

## 2020-12-03 DIAGNOSIS — M5416 Radiculopathy, lumbar region: Secondary | ICD-10-CM

## 2020-12-03 DIAGNOSIS — G8929 Other chronic pain: Secondary | ICD-10-CM | POA: Diagnosis not present

## 2020-12-03 DIAGNOSIS — R29898 Other symptoms and signs involving the musculoskeletal system: Secondary | ICD-10-CM | POA: Diagnosis not present

## 2020-12-03 NOTE — Telephone Encounter (Signed)
Patient stopped in and stated that she 3 PT sessions and they are not helping at all, she wanted to know what the next step should be, if she should try an injection or what to do from here. She wanted a call about this instead of me scheduling an appointment to discuss this. She hurts all the time and needs another option besides Physical Therapy. [phone]
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
## 2020-12-03 NOTE — Telephone Encounter (Signed)
Patient aware of Dr. Darene Lamer recommendations and that the MRI may not be covered. She states that she will not be able to have it if it is not covered. She reports being miserable in pain with it waking her up at night. She states she "just can't go one like this".

## 2020-12-03 NOTE — Telephone Encounter (Signed)
The next step would be an injection, we would need an MRI, she has not done a full 6 weeks of physical therapy so insurance may not pay for it, she just may need to pay for it herself, I will order the MRI.

## 2020-12-03 NOTE — Progress Notes (Signed)
Subjective: 70 year old female presents the office today for follow-up evaluation of nail dystrophy as well as for right traumatic nail avulsion of the hallux.  She is the right side is healed.  She feels that the left big toenail is coming back as well as the left second digit toenail.  She still been using the urea nail gel.  Not sure if it is been helping.  No swelling or redness or any drainage the toenail sites.  She has no other concerns today.  Objective: AAO x3, NAD DP/PT pulses palpable bilaterally, CRT less than 3 seconds Right hallux nail bed is healed and the nail started to grow back in.  Overall the nails still appear to be hypertrophic, dystrophic with brown discoloration.  No hyperpigmented changes.  No edema, erythema or signs of infection.  Hammertoes are present. No pain with calf compression, swelling, warmth, erythema  Assessment: Onychodystrophy  Plan: -All treatment options discussed with the patient including all alternatives, risks, complications.  -As a courtesy debride the nails with any complications or bleeding.  I continue with urea nail gel -Patient encouraged to call the office with any questions, concerns, change in symptoms.   Trula Slade DPM

## 2020-12-08 ENCOUNTER — Other Ambulatory Visit: Payer: Self-pay

## 2020-12-08 ENCOUNTER — Ambulatory Visit (INDEPENDENT_AMBULATORY_CARE_PROVIDER_SITE_OTHER): Payer: Medicare PPO

## 2020-12-08 ENCOUNTER — Encounter: Payer: Medicare PPO | Admitting: Physical Therapy

## 2020-12-08 DIAGNOSIS — M5116 Intervertebral disc disorders with radiculopathy, lumbar region: Secondary | ICD-10-CM | POA: Diagnosis not present

## 2020-12-08 DIAGNOSIS — M5117 Intervertebral disc disorders with radiculopathy, lumbosacral region: Secondary | ICD-10-CM | POA: Diagnosis not present

## 2020-12-08 DIAGNOSIS — M5416 Radiculopathy, lumbar region: Secondary | ICD-10-CM | POA: Diagnosis not present

## 2020-12-08 DIAGNOSIS — M4726 Other spondylosis with radiculopathy, lumbar region: Secondary | ICD-10-CM | POA: Diagnosis not present

## 2020-12-08 DIAGNOSIS — M419 Scoliosis, unspecified: Secondary | ICD-10-CM | POA: Diagnosis not present

## 2020-12-08 IMAGING — MR MR LUMBAR SPINE W/O CM
4 of 5 series · 28 of 48 positions shown · non-contrast
Comparison: None.

EXAM:
MRI LUMBAR SPINE WITHOUT CONTRAST
TECHNIQUE: Multiplanar, multisequence MR imaging of the lumbar spine was
performed. No intravenous contrast was administered.

[Series 3: T2 · sagittal · 4.0mm · 0.78mm/px · 6 of 17 slices shown (1 of 2)]
[im 1/17]
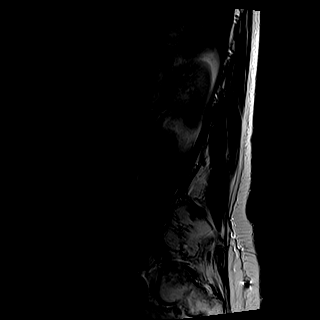
[im 4/17]
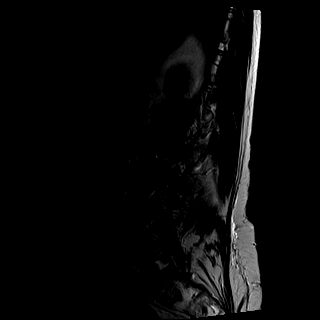
[im 7/17]
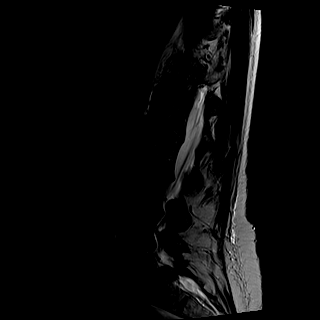
[im 10/17]
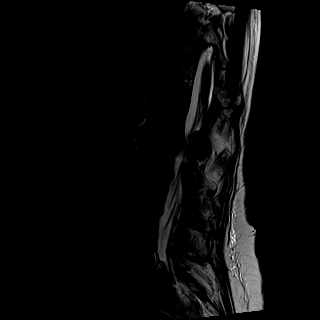
[im 13/17]
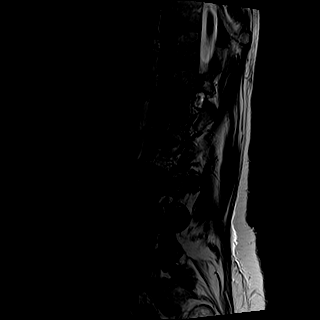
[im 17/17]
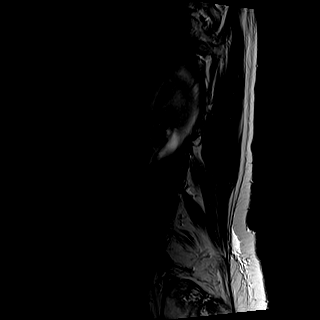

[Series 4: T1 · sagittal · 4.0mm · 0.49mm/px · 6 of 17 slices shown (1 of 2)]
[im 1/17]
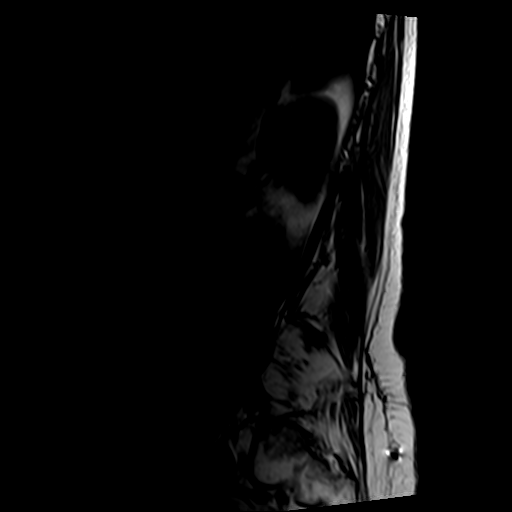
[im 4/17]
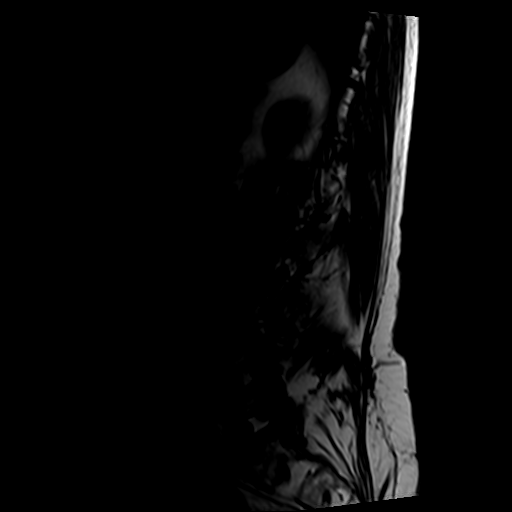
[im 7/17]
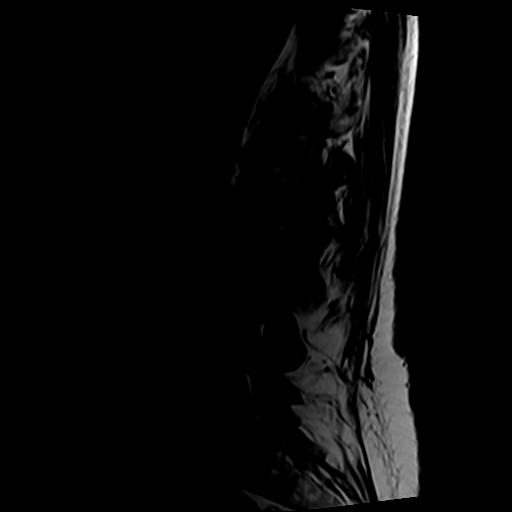
[im 10/17]
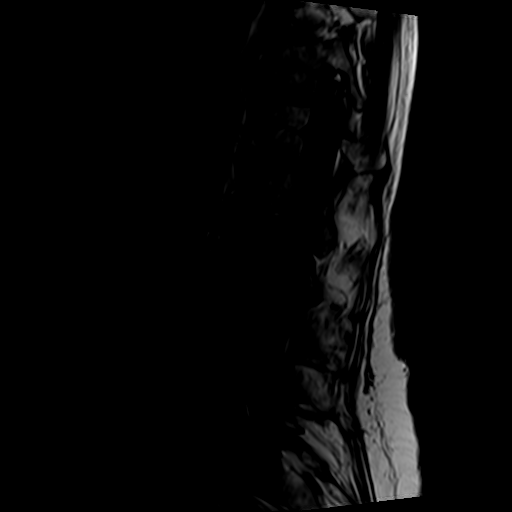
[im 13/17]
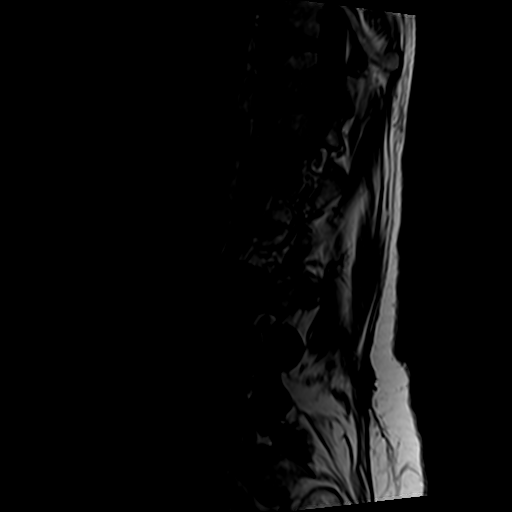
[im 17/17]
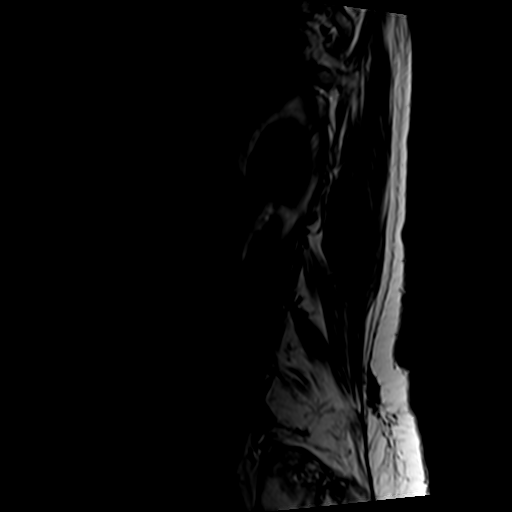

[Series 6: T2 · axial · 4.0mm · 0.78mm/px · z∈[-123,+109]mm · 9 of 43 slices shown (2 of 2)]
[im 1/43]
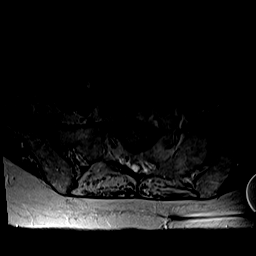
[im 7/43]
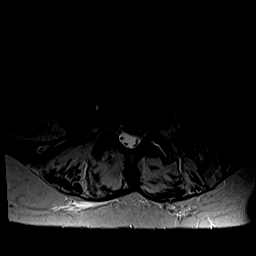
[im 13/43]
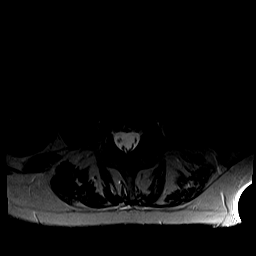
[im 19/43]
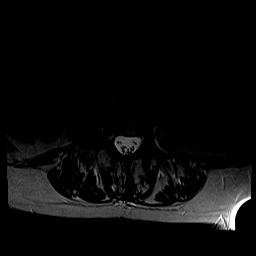
[im 22/43]
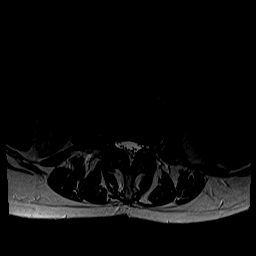
[im 25/43]
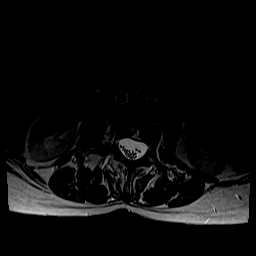
[im 31/43]
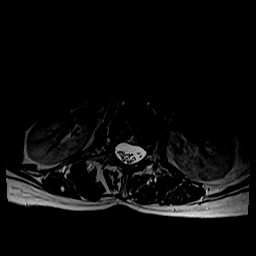
[im 37/43]
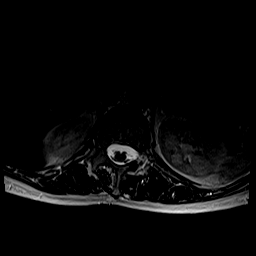
[im 43/43]
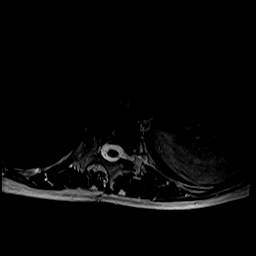

[Series 7: T1 · axial · 4.0mm · 0.39mm/px · z∈[-123,+80]mm · 7 of 43 slices shown (2 of 2)]
[im 1/43]
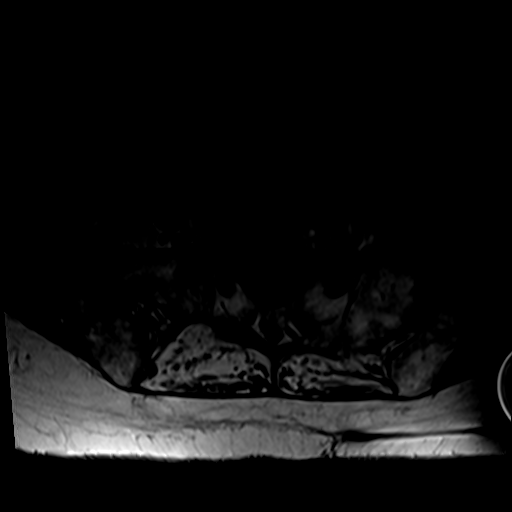
[im 7/43]
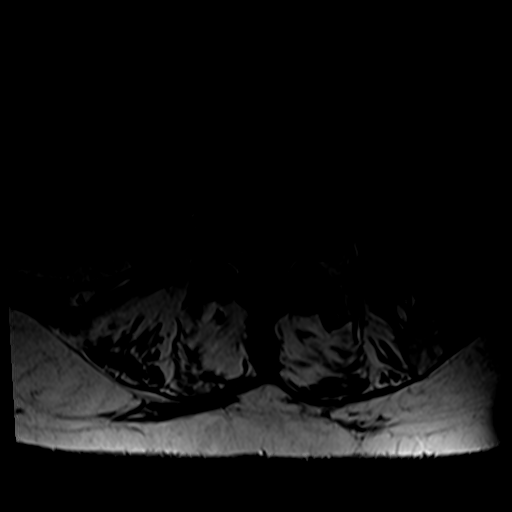
[im 13/43]
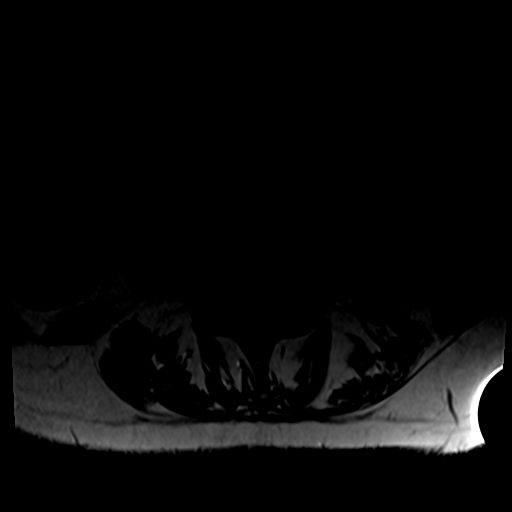
[im 19/43]
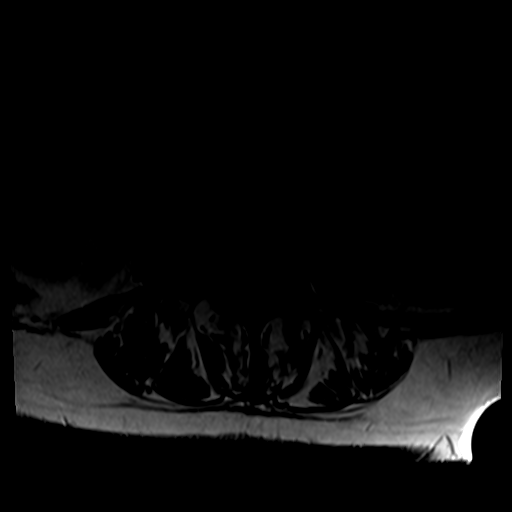
[im 22/43]
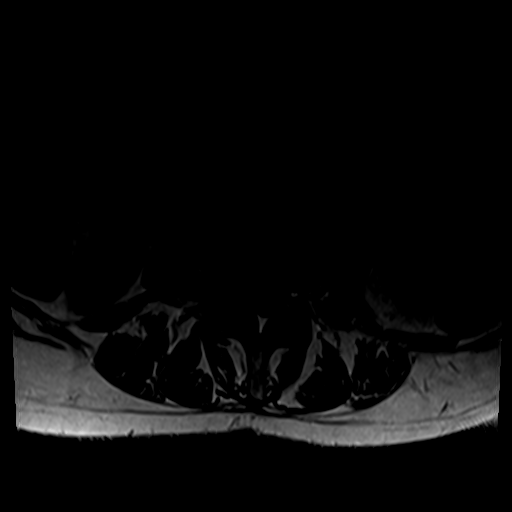
[im 25/43]
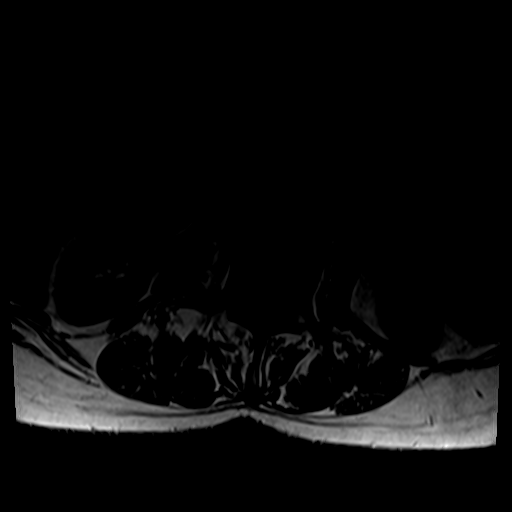
[im 37/43]
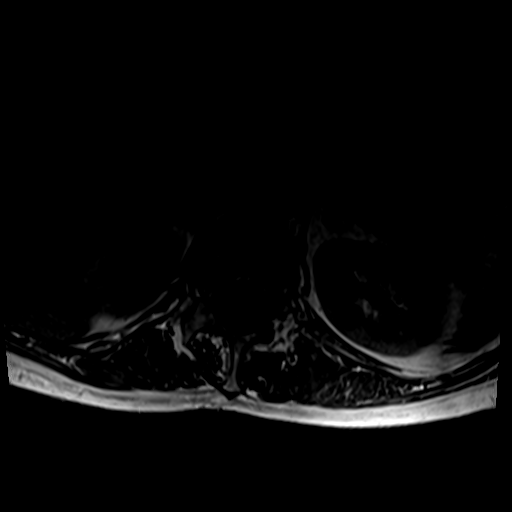

[28 of 48 positions shown; findings below may reference images not displayed]

FINDINGS: Segmentation:  Standard.

Alignment:  Levoscoliosis.

Vertebrae: Multilevel degenerative endplate irregularity. Vertebral
body heights are otherwise maintained. Degenerative endplate marrow
changes are present without substantial edema. No suspicious osseous
lesion.

Conus medullaris and cauda equina: Conus extends to the T12-L1
level. Conus and cauda equina appear normal.

Paraspinal and other soft tissues: Unremarkable.

Disc levels:

L1-L2: Disc bulge with endplate osteophytic ridging eccentric to the
right. No canal or left foraminal stenosis. Minor right foraminal
stenosis.

L2-L3: Disc bulge with endplate osteophytic ridging eccentric to the
right. Facet arthropathy. No canal or foraminal stenosis.

L3-L4: Disc bulge with endplate osteophytic ridging eccentric to the
left. Facet arthropathy. No canal or right foraminal stenosis. Mild
to moderate left foraminal stenosis.

L4-L5: Disc bulge with endplate osteophytic ridging. Facet
arthropathy with ligamentum flavum infolding. No canal stenosis.
Minor right foraminal stenosis. Mild to moderate left foraminal
stenosis.

L5-S1: Disc bulge with superimposed central/left subarticular disc
extrusion extending below disc level. Mild canal stenosis. Narrowing
of the left subarticular recess with compression of traversing S1
nerve roots. Minor foraminal stenosis, left greater than right.
IMPRESSION: Multilevel degenerative changes as detailed above superimposed on
scoliotic curvature. Most notably, a disc herniation at L5-S1
compresses the traversing left S1 nerve roots.

## 2020-12-09 DIAGNOSIS — J301 Allergic rhinitis due to pollen: Secondary | ICD-10-CM | POA: Diagnosis not present

## 2020-12-09 DIAGNOSIS — J3089 Other allergic rhinitis: Secondary | ICD-10-CM | POA: Diagnosis not present

## 2020-12-09 DIAGNOSIS — J4520 Mild intermittent asthma, uncomplicated: Secondary | ICD-10-CM | POA: Diagnosis not present

## 2020-12-09 DIAGNOSIS — J3081 Allergic rhinitis due to animal (cat) (dog) hair and dander: Secondary | ICD-10-CM | POA: Diagnosis not present

## 2020-12-10 ENCOUNTER — Ambulatory Visit (INDEPENDENT_AMBULATORY_CARE_PROVIDER_SITE_OTHER): Payer: Medicare PPO | Admitting: Physical Therapy

## 2020-12-10 ENCOUNTER — Other Ambulatory Visit: Payer: Self-pay

## 2020-12-10 ENCOUNTER — Ambulatory Visit (INDEPENDENT_AMBULATORY_CARE_PROVIDER_SITE_OTHER): Payer: Medicare PPO | Admitting: Sports Medicine

## 2020-12-10 ENCOUNTER — Encounter: Payer: Self-pay | Admitting: Physical Therapy

## 2020-12-10 DIAGNOSIS — G8929 Other chronic pain: Secondary | ICD-10-CM

## 2020-12-10 DIAGNOSIS — M6281 Muscle weakness (generalized): Secondary | ICD-10-CM

## 2020-12-10 DIAGNOSIS — M5416 Radiculopathy, lumbar region: Secondary | ICD-10-CM | POA: Diagnosis not present

## 2020-12-10 DIAGNOSIS — M5442 Lumbago with sciatica, left side: Secondary | ICD-10-CM | POA: Diagnosis not present

## 2020-12-10 DIAGNOSIS — R29898 Other symptoms and signs involving the musculoskeletal system: Secondary | ICD-10-CM
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
## 2020-12-10 NOTE — Progress Notes (Signed)
    Procedures performed today:    None.  Independent interpretation of notes and tests performed by another provider:   Lumbar spine MRI personally reviewed, multilevel facet arthritis, there are disc protrusions at L4-L5 and L5-S1, both broad-based, the L5-S1 distribution does look relatively subacute, and appears to compress the left S1 and L5 nerve roots.  Brief History, Exam, Impression, and Recommendations:    Left lumbar radiculitis This pleasant 70 year old female returns, chronic lumbar DDD, L5-S1 disc height loss, she was doing some stretching recently, felt a pop in her back with radiation down the left leg to the outside of the left thigh, lateral left lower leg but not to the foot. We obtained an MRI which shows a large left L5-S1 disc protrusion with impingement of the S1 and L5 nerve roots, we want to proceed with a left L5-S1 transforaminal and left selective S1 epidural. I would also like a consultation with neurosurgery considering the size of the herniation. Return to see me 4 weeks after the epidurals.    ___________________________________________ Gwen Her. Dianah Field, M.D., ABFM., CAQSM. Primary Care and [place] Instructor of [place] [facility] Medicine

## 2020-12-10 NOTE — Assessment & Plan Note (Signed)
This pleasant 70 year old female returns, chronic lumbar DDD, L5-S1 disc height loss, she was doing some stretching recently, felt a pop in her back with radiation down the left leg to the outside of the left thigh, lateral left lower leg but not to the foot. We obtained an MRI which shows a large left L5-S1 disc protrusion with impingement of the S1 and L5 nerve roots, we want to proceed with a left L5-S1 transforaminal and left selective S1 epidural. I would also like a consultation with neurosurgery considering the size of the herniation. Return to see me 4 weeks after the epidurals.

## 2020-12-10 NOTE — Patient Instructions (Signed)
Access Code: [email or web address] URL: [email or web address] Date: 12/10/2020 Prepared by: [place]  Exercises Seated Hamstring Stretch - 1 x daily - 7 x weekly - 1 sets - 2 reps - 20-30 seconds hold Supine Hip External Rotation Stretch - 2 x daily - 7 x weekly - 1 sets - 3 reps - 20 seconds hold Supine Pelvic Tilt - 1 x daily - 7 x weekly - 3 sets - 10 reps Supine Hip Internal and External Rotation - 2 x daily - 7 x weekly - 3 reps - 10 seconds hold

## 2020-12-11 ENCOUNTER — Other Ambulatory Visit (HOSPITAL_BASED_OUTPATIENT_CLINIC_OR_DEPARTMENT_OTHER): Payer: Self-pay

## 2020-12-11 ENCOUNTER — Other Ambulatory Visit: Payer: Self-pay | Admitting: Family Medicine

## 2020-12-11 DIAGNOSIS — M797 Fibromyalgia: Secondary | ICD-10-CM

## 2020-12-11 MED ORDER — METHOCARBAMOL 500 MG PO TABS
ORAL_TABLET | ORAL | 1 refills | Status: DC
  Filled 2020-12-11: qty 30, 15d supply, fill #0
  Filled 2021-01-01: qty 30, 15d supply, fill #1

## 2020-12-12 ENCOUNTER — Other Ambulatory Visit (HOSPITAL_BASED_OUTPATIENT_CLINIC_OR_DEPARTMENT_OTHER): Payer: Self-pay

## 2020-12-15 ENCOUNTER — Encounter: Payer: Self-pay | Admitting: Family Medicine

## 2020-12-15 ENCOUNTER — Encounter: Payer: Medicare PPO | Admitting: Physical Therapy

## 2020-12-15 ENCOUNTER — Other Ambulatory Visit (HOSPITAL_BASED_OUTPATIENT_CLINIC_OR_DEPARTMENT_OTHER): Payer: Self-pay

## 2020-12-15 ENCOUNTER — Ambulatory Visit (INDEPENDENT_AMBULATORY_CARE_PROVIDER_SITE_OTHER): Payer: Medicare PPO | Admitting: Family Medicine

## 2020-12-15 VITALS — BP 117/56 | HR 92 | Temp 97.6°F | Ht 66.0 in | Wt 124.0 lb

## 2020-12-15 DIAGNOSIS — R8290 Unspecified abnormal findings in urine: Secondary | ICD-10-CM

## 2020-12-15 DIAGNOSIS — N3090 Cystitis, unspecified without hematuria: Secondary | ICD-10-CM

## 2020-12-15 LAB — POCT URINALYSIS DIP (CLINITEK)
Bilirubin, UA: NEGATIVE
Blood, UA: NEGATIVE
Glucose, UA: NEGATIVE mg/dL
Ketones, POC UA: NEGATIVE mg/dL
Nitrite, UA: POSITIVE — AB
POC PROTEIN,UA: NEGATIVE
Spec Grav, UA: 1.01 (ref 1.010–1.025)
Urobilinogen, UA: 0.2 E.U./dL
pH, UA: 6.5 (ref 5.0–8.0)

## 2020-12-15 MED ORDER — CEPHALEXIN 500 MG PO CAPS
500.0000 mg | ORAL_CAPSULE | Freq: Two times a day (BID) | ORAL | 0 refills | Status: AC
  Filled 2020-12-15: qty 10, 5d supply, fill #0

## 2020-12-15 NOTE — Assessment & Plan Note (Signed)
She has symptoms as well as urinalysis consistent with cystitis.  Unfortunately she was only able to provide a small sample of urine, with volume too small to be sent for culture.  Her previous culture grew pansensitive E. coli.  Starting Keflex 500 mg twice daily x5 days.  She will bring a urine specimen for retesting if symptoms are not improving.
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
## 2020-12-15 NOTE — Patient Instructions (Signed)

## 2020-12-16 ENCOUNTER — Ambulatory Visit: Payer: Medicare PPO | Attending: Internal Medicine

## 2020-12-16 ENCOUNTER — Other Ambulatory Visit (HOSPITAL_BASED_OUTPATIENT_CLINIC_OR_DEPARTMENT_OTHER): Payer: Self-pay

## 2020-12-16 DIAGNOSIS — N3090 Cystitis, unspecified without hematuria: Secondary | ICD-10-CM | POA: Diagnosis not present

## 2020-12-16 DIAGNOSIS — Z23 Encounter for immunization: Secondary | ICD-10-CM

## 2020-12-16 DIAGNOSIS — R8290 Unspecified abnormal findings in urine: Secondary | ICD-10-CM | POA: Diagnosis not present

## 2020-12-16 MED ORDER — INFLUENZA VAC A&B SA ADJ QUAD 0.5 ML IM PRSY
PREFILLED_SYRINGE | INTRAMUSCULAR | 0 refills | Status: DC
  Filled 2020-12-16: qty 0.5, 1d supply, fill #0

## 2020-12-16 NOTE — Addendum Note (Signed)
Addended by: Peggye Ley on: 12/16/2020 03:52 PM   Modules accepted: Orders

## 2020-12-16 NOTE — Progress Notes (Signed)
   Covid-19 Vaccination Clinic  Name:  Robin Conrad    MRN: [id number] DOB: 1951-02-15  12/16/2020  [name] was observed post Covid-19 immunization for 15 minutes without incident. She was provided with Vaccine Information Sheet and instruction to access the V-Safe system.   [name] was instructed to call 911 with any severe reactions post vaccine: Difficulty breathing  Swelling of face and throat  A fast heartbeat  A bad rash all over body  Dizziness and weakness

## 2020-12-17 ENCOUNTER — Encounter: Payer: Medicare PPO | Admitting: Physical Therapy

## 2020-12-18 LAB — URINE CULTURE
MICRO NUMBER:: 12461500
SPECIMEN QUALITY:: ADEQUATE

## 2020-12-22 ENCOUNTER — Ambulatory Visit
Admission: RE | Admit: 2020-12-22 | Discharge: 2020-12-22 | Disposition: A | Discharge status: Home / Self Care | Payer: Medicare PPO | Source: Ambulatory Visit | Attending: Sports Medicine | Admitting: Sports Medicine

## 2020-12-22 ENCOUNTER — Other Ambulatory Visit: Payer: Self-pay

## 2020-12-22 DIAGNOSIS — M5416 Radiculopathy, lumbar region: Secondary | ICD-10-CM

## 2020-12-22 DIAGNOSIS — M47817 Spondylosis without myelopathy or radiculopathy, lumbosacral region: Secondary | ICD-10-CM | POA: Diagnosis not present

## 2020-12-22 DIAGNOSIS — M5126 Other intervertebral disc displacement, lumbar region: Secondary | ICD-10-CM | POA: Diagnosis not present

## 2020-12-22 MED ORDER — METHYLPREDNISOLONE ACETATE 40 MG/ML INJ SUSP (RADIOLOG
80.0000 mg | Freq: Once | INTRAMUSCULAR | Status: AC
  Administered 2020-12-22: 80 mg via EPIDURAL

## 2020-12-22 MED ORDER — IOPAMIDOL (ISOVUE-M 200) INJECTION 41%
1.0000 mL | Freq: Once | INTRAMUSCULAR | Status: AC
  Administered 2020-12-22: 1 mL via EPIDURAL

## 2020-12-22 NOTE — Discharge Instructions (Signed)

## 2020-12-24 ENCOUNTER — Telehealth: Payer: Self-pay

## 2020-12-24 ENCOUNTER — Other Ambulatory Visit: Payer: Self-pay | Admitting: Family Medicine

## 2020-12-24 ENCOUNTER — Other Ambulatory Visit (HOSPITAL_BASED_OUTPATIENT_CLINIC_OR_DEPARTMENT_OTHER): Payer: Self-pay

## 2020-12-24 MED ORDER — BUPROPION HCL ER (XL) 150 MG PO TB24
ORAL_TABLET | Freq: Every morning | ORAL | 1 refills | Status: DC
  Filled 2020-12-24: qty 90, 30d supply, fill #0
  Filled 2021-01-22: qty 90, 30d supply, fill #1

## 2020-12-24 NOTE — Telephone Encounter (Signed)
Pt lvm stating she feels another UTI starting.   She was treated recently for the same.  Recommendations or medications?

## 2020-12-24 NOTE — Telephone Encounter (Signed)
Pt has been scheduled for Nurse Visit.

## 2020-12-24 NOTE — Telephone Encounter (Signed)
Since she was just treated, needs to come in for a urine culture.  That way we can see if it is the same bacteria, different, or if it looks like it has cleared.

## 2020-12-25 ENCOUNTER — Ambulatory Visit (INDEPENDENT_AMBULATORY_CARE_PROVIDER_SITE_OTHER): Payer: Medicare PPO | Admitting: Family Medicine

## 2020-12-25 ENCOUNTER — Other Ambulatory Visit (HOSPITAL_BASED_OUTPATIENT_CLINIC_OR_DEPARTMENT_OTHER): Payer: Self-pay

## 2020-12-25 VITALS — BP 115/71 | HR 85 | Temp 97.5°F | Wt 125.0 lb

## 2020-12-25 DIAGNOSIS — R300 Dysuria: Secondary | ICD-10-CM

## 2020-12-25 LAB — POCT URINALYSIS DIP (CLINITEK)
Bilirubin, UA: NEGATIVE
Glucose, UA: NEGATIVE mg/dL
Ketones, POC UA: NEGATIVE mg/dL
Nitrite, UA: POSITIVE — AB
POC PROTEIN,UA: NEGATIVE
Spec Grav, UA: >=1.03 — AB (ref 1.010–1.025)
Urobilinogen, UA: 0.2 E.U./dL
pH, UA: 6 (ref 5.0–8.0)

## 2020-12-25 MED ORDER — SULFAMETHOXAZOLE-TRIMETHOPRIM 800-160 MG PO TABS
1.0000 | ORAL_TABLET | Freq: Two times a day (BID) | ORAL | 0 refills | Status: DC
  Filled 2020-12-25: qty 10, 5d supply, fill #0

## 2020-12-25 NOTE — Progress Notes (Signed)
Patient is here for a NV for UTI-like symptoms. Per patient, having painful urination with odor. Patient reports no recent antibiotic use or recent catherization. Patient has not taken Azo. Denies flank pain, fever, chills or sweats. Urine culture pending.

## 2020-12-25 NOTE — Progress Notes (Signed)
Task completed. Patient has been updated regarding antibiotic sent to the pharmacy. Patient has been informed to contact the office if she continues to have symptoms.

## 2020-12-25 NOTE — Progress Notes (Signed)
She was seen 10 days ago by one of my partners for urinary symptoms she was treated with Keflex.  Based on the urine culture results from that episode of care it was consistent with the sensitivities.  She is back today because she is continuing to have symptoms.  Based on previous culture get a switch her to Bactrim and repeat culture was sent today  .

## 2020-12-26 ENCOUNTER — Ambulatory Visit: Payer: Medicare PPO | Admitting: Sports Medicine

## 2020-12-26 ENCOUNTER — Other Ambulatory Visit (HOSPITAL_BASED_OUTPATIENT_CLINIC_OR_DEPARTMENT_OTHER): Payer: Self-pay

## 2020-12-26 MED ORDER — COVID-19 MRNA VACC (MODERNA) 100 MCG/0.5ML IM SUSP
INTRAMUSCULAR | 0 refills | Status: DC
  Filled 2020-12-26: qty 0.5, 1d supply, fill #0

## 2020-12-27 LAB — URINE CULTURE
MICRO NUMBER:: 12502169
SPECIMEN QUALITY:: ADEQUATE

## 2020-12-29 ENCOUNTER — Ambulatory Visit: Payer: Medicare PPO | Admitting: Gastroenterology

## 2020-12-29 ENCOUNTER — Other Ambulatory Visit (HOSPITAL_BASED_OUTPATIENT_CLINIC_OR_DEPARTMENT_OTHER): Payer: Self-pay

## 2020-12-29 MED ORDER — NITROFURANTOIN MONOHYD MACRO 100 MG PO CAPS
100.0000 mg | ORAL_CAPSULE | Freq: Two times a day (BID) | ORAL | 0 refills | Status: DC
  Filled 2020-12-29: qty 10, 5d supply, fill #0

## 2020-12-29 NOTE — Addendum Note (Signed)
Addended by: Beatrice Lecher D on: 12/29/2020 07:22 AM   Modules accepted: Orders

## 2020-12-29 NOTE — Progress Notes (Signed)
Hi Robin Conrad, your urine culture is till positive. No resistance which is good. I am not sure why you are not clearing this infection.  Get a go ahead and send over another round of antibiotics.

## 2020-12-30 DIAGNOSIS — J3089 Other allergic rhinitis: Secondary | ICD-10-CM | POA: Diagnosis not present

## 2020-12-30 DIAGNOSIS — J301 Allergic rhinitis due to pollen: Secondary | ICD-10-CM | POA: Diagnosis not present

## 2020-12-30 DIAGNOSIS — J3081 Allergic rhinitis due to animal (cat) (dog) hair and dander: Secondary | ICD-10-CM | POA: Diagnosis not present

## 2021-01-01 ENCOUNTER — Other Ambulatory Visit (HOSPITAL_BASED_OUTPATIENT_CLINIC_OR_DEPARTMENT_OTHER): Payer: Self-pay

## 2021-01-02 ENCOUNTER — Other Ambulatory Visit (HOSPITAL_BASED_OUTPATIENT_CLINIC_OR_DEPARTMENT_OTHER): Payer: Self-pay

## 2021-01-06 ENCOUNTER — Encounter: Payer: Self-pay | Admitting: Family Medicine

## 2021-01-06 DIAGNOSIS — M5127 Other intervertebral disc displacement, lumbosacral region: Secondary | ICD-10-CM | POA: Diagnosis not present

## 2021-01-07 ENCOUNTER — Ambulatory Visit (INDEPENDENT_AMBULATORY_CARE_PROVIDER_SITE_OTHER): Payer: Medicare PPO | Admitting: Sports Medicine

## 2021-01-07 DIAGNOSIS — M5416 Radiculopathy, lumbar region: Secondary | ICD-10-CM | POA: Diagnosis not present

## 2021-01-07 NOTE — Assessment & Plan Note (Signed)
Robin Conrad returns, she is a pleasant 70 year old female with chronic lumbar DDD, L5-S1 disc height loss, she does have a very large left L5-S1 disc protrusion with impingement of the S1 and L5 nerve roots. She had a left L5-S1 transforaminal epidural without much relief, she has not had a selective S1 epidural, we did get a consultation with neurosurgery considering the size of the disc herniation and Dr. Ronnald Ramp is planning to operate. I am happy to keep her going with tramadol which she will need to use up to twice daily in the meantime. Looks like Dr. Madilyn Fireman just refilled some in August, I am happy to take these over.

## 2021-01-07 NOTE — Progress Notes (Signed)
    Procedures performed today:    None.  Independent interpretation of notes and tests performed by another provider:   None.  Brief History, Exam, Impression, and Recommendations:    Left lumbar radiculitis Robin Conrad returns, she is a pleasant 70 year old female with chronic lumbar DDD, L5-S1 disc height loss, she does have a very large left L5-S1 disc protrusion with impingement of the S1 and L5 nerve roots. She had a left L5-S1 transforaminal epidural without much relief, she has not had a selective S1 epidural, we did get a consultation with neurosurgery considering the size of the disc herniation and Dr. Ronnald Ramp is planning to operate. I am happy to keep her going with tramadol which she will need to use up to twice daily in the meantime. Looks like Dr. Madilyn Fireman just refilled some in August, I am happy to take these over.    ___________________________________________ Gwen Her. Dianah Field, M.D., ABFM., CAQSM. Primary Care and [place] Instructor of [place] of [facility] of Medicine

## 2021-01-08 ENCOUNTER — Ambulatory Visit (INDEPENDENT_AMBULATORY_CARE_PROVIDER_SITE_OTHER): Payer: Medicare PPO | Admitting: Gastroenterology

## 2021-01-08 ENCOUNTER — Other Ambulatory Visit: Payer: Self-pay

## 2021-01-08 ENCOUNTER — Encounter: Payer: Self-pay | Admitting: Gastroenterology

## 2021-01-08 ENCOUNTER — Other Ambulatory Visit (HOSPITAL_BASED_OUTPATIENT_CLINIC_OR_DEPARTMENT_OTHER): Payer: Self-pay

## 2021-01-08 VITALS — BP 122/62 | HR 80 | Ht 66.0 in | Wt 124.5 lb

## 2021-01-08 DIAGNOSIS — K219 Gastro-esophageal reflux disease without esophagitis: Secondary | ICD-10-CM

## 2021-01-08 DIAGNOSIS — K581 Irritable bowel syndrome with constipation: Secondary | ICD-10-CM | POA: Diagnosis not present

## 2021-01-08 MED FILL — Famotidine Tab 20 MG: ORAL | 90 days supply | Qty: 90 | Fill #2 | Status: CN
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
## 2021-01-08 NOTE — Patient Instructions (Signed)
If you are age 71 or older, your body mass index should be between 23-30. Your Body mass index is 20.09 kg/m. If this is out of the aforementioned range listed, please consider follow up with your Primary Care Provider.  If you are age 48 or younger, your body mass index should be between 19-25. Your Body mass index is 20.09 kg/m. If this is out of the aformentioned range listed, please consider follow up with your Primary Care Provider.   __________________________________________________________  The [facility] providers would like to encourage you to use [facility] to communicate with providers for non-urgent requests or questions.  Due to long hold times on the telephone, sending your provider a message by [facility] may be a faster and more efficient way to get a response.  Please allow 48 business hours for a response.  Please remember that this is for non-urgent requests.   Due to recent changes in healthcare laws, you may see the results of your imaging and laboratory studies on MyChart before your provider has had a chance to review them.  We understand that in some cases there may be results that are confusing or concerning to you. Not all laboratory results come back in the same time frame and the provider may be waiting for multiple results in order to interpret others.  Please give [place] 48 hours in order for your provider to thoroughly review all the results before contacting the office for clarification of your results.   Thank you for choosing me and [facility].  Vito Cirigliano, D.O.

## 2021-01-13 DIAGNOSIS — J3089 Other allergic rhinitis: Secondary | ICD-10-CM | POA: Diagnosis not present

## 2021-01-13 DIAGNOSIS — J3081 Allergic rhinitis due to animal (cat) (dog) hair and dander: Secondary | ICD-10-CM | POA: Diagnosis not present

## 2021-01-13 DIAGNOSIS — J301 Allergic rhinitis due to pollen: Secondary | ICD-10-CM | POA: Diagnosis not present

## 2021-01-22 ENCOUNTER — Other Ambulatory Visit (HOSPITAL_BASED_OUTPATIENT_CLINIC_OR_DEPARTMENT_OTHER): Payer: Self-pay

## 2021-01-23 ENCOUNTER — Other Ambulatory Visit (HOSPITAL_BASED_OUTPATIENT_CLINIC_OR_DEPARTMENT_OTHER): Payer: Self-pay

## 2021-01-23 DIAGNOSIS — M4807 Spinal stenosis, lumbosacral region: Secondary | ICD-10-CM | POA: Diagnosis not present

## 2021-01-23 DIAGNOSIS — M5127 Other intervertebral disc displacement, lumbosacral region: Secondary | ICD-10-CM | POA: Diagnosis not present

## 2021-01-23 MED ORDER — METHOCARBAMOL 500 MG PO TABS
ORAL_TABLET | ORAL | 0 refills | Status: DC
  Filled 2021-01-23: qty 45, 12d supply, fill #0

## 2021-01-23 MED ORDER — HYDROCODONE-ACETAMINOPHEN 5-325 MG PO TABS
ORAL_TABLET | ORAL | 0 refills | Status: DC
  Filled 2021-01-23: qty 30, 8d supply, fill #0

## 2021-01-24 ENCOUNTER — Other Ambulatory Visit: Payer: Self-pay

## 2021-01-24 ENCOUNTER — Encounter (HOSPITAL_BASED_OUTPATIENT_CLINIC_OR_DEPARTMENT_OTHER): Payer: Self-pay | Admitting: *Deleted

## 2021-01-24 ENCOUNTER — Emergency Department (HOSPITAL_BASED_OUTPATIENT_CLINIC_OR_DEPARTMENT_OTHER)
Admission: EM | Admit: 2021-01-24 | Discharge: 2021-01-24 | Disposition: A | Discharge status: Home / Self Care | Payer: Medicare PPO | Attending: Emergency Medicine | Admitting: Emergency Medicine

## 2021-01-24 DIAGNOSIS — Z7951 Long term (current) use of inhaled steroids: Secondary | ICD-10-CM | POA: Insufficient documentation

## 2021-01-24 DIAGNOSIS — J45909 Unspecified asthma, uncomplicated: Secondary | ICD-10-CM | POA: Diagnosis not present

## 2021-01-24 DIAGNOSIS — Z9889 Other specified postprocedural states: Secondary | ICD-10-CM | POA: Diagnosis not present

## 2021-01-24 DIAGNOSIS — R059 Cough, unspecified: Secondary | ICD-10-CM | POA: Insufficient documentation

## 2021-01-24 DIAGNOSIS — E039 Hypothyroidism, unspecified: Secondary | ICD-10-CM | POA: Insufficient documentation

## 2021-01-24 DIAGNOSIS — R0789 Other chest pain: Secondary | ICD-10-CM | POA: Insufficient documentation

## 2021-01-24 DIAGNOSIS — Z91040 Latex allergy status: Secondary | ICD-10-CM | POA: Insufficient documentation
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
## 2021-01-24 NOTE — Discharge Instructions (Signed)
Your symptoms can be explained postoperative after being intubated.  There is no sign of an allergic reaction at this time and you are safe to continue taking your pain medication.  Recommend routine follow-up with your surgeon and PCP as needed.

## 2021-01-24 NOTE — ED Triage Notes (Addendum)
States she had back surgery yesterday. Today c/o itching, cough, congestion. Voices concern it may be allergy to percocet. She took 10 ml benadryl at 2015

## 2021-01-26 ENCOUNTER — Telehealth: Payer: Self-pay

## 2021-01-26 DIAGNOSIS — M810 Age-related osteoporosis without current pathological fracture: Secondary | ICD-10-CM

## 2021-01-26 NOTE — Telephone Encounter (Signed)
Medication: denosumab (PROLIA) injection 60 mg Prior authorization submitted via CoverMyMeds on 01/26/2021 [place] submission pending  Pt aware via MyChart message

## 2021-02-02 ENCOUNTER — Other Ambulatory Visit (HOSPITAL_BASED_OUTPATIENT_CLINIC_OR_DEPARTMENT_OTHER): Payer: Self-pay

## 2021-02-02 ENCOUNTER — Other Ambulatory Visit: Payer: Self-pay | Admitting: Family Medicine

## 2021-02-03 ENCOUNTER — Other Ambulatory Visit (HOSPITAL_BASED_OUTPATIENT_CLINIC_OR_DEPARTMENT_OTHER): Payer: Self-pay

## 2021-02-03 MED ORDER — TRAMADOL HCL 50 MG PO TABS
ORAL_TABLET | ORAL | 2 refills | Status: DC
  Filled 2021-02-03: qty 60, 10d supply, fill #0
  Filled 2021-03-02: qty 60, 10d supply, fill #1
  Filled 2021-03-26: qty 42, 7d supply, fill #2
  Filled 2021-04-21: qty 18, 3d supply, fill #3

## 2021-02-09 DIAGNOSIS — J3089 Other allergic rhinitis: Secondary | ICD-10-CM | POA: Diagnosis not present

## 2021-02-09 DIAGNOSIS — J301 Allergic rhinitis due to pollen: Secondary | ICD-10-CM | POA: Diagnosis not present

## 2021-02-09 DIAGNOSIS — J3081 Allergic rhinitis due to animal (cat) (dog) hair and dander: Secondary | ICD-10-CM | POA: Diagnosis not present

## 2021-02-10 ENCOUNTER — Other Ambulatory Visit: Payer: Self-pay | Admitting: Family Medicine

## 2021-02-10 ENCOUNTER — Other Ambulatory Visit (HOSPITAL_BASED_OUTPATIENT_CLINIC_OR_DEPARTMENT_OTHER): Payer: Self-pay

## 2021-02-10 DIAGNOSIS — M797 Fibromyalgia: Secondary | ICD-10-CM | POA: Diagnosis not present

## 2021-02-10 DIAGNOSIS — M351 Other overlap syndromes: Secondary | ICD-10-CM | POA: Diagnosis not present

## 2021-02-10 DIAGNOSIS — Z791 Long term (current) use of non-steroidal anti-inflammatories (NSAID): Secondary | ICD-10-CM | POA: Diagnosis not present

## 2021-02-10 DIAGNOSIS — F411 Generalized anxiety disorder: Secondary | ICD-10-CM

## 2021-02-10 MED FILL — Famotidine Tab 20 MG: ORAL | 90 days supply | Qty: 90 | Fill #2 | Status: AC

## 2021-02-11 ENCOUNTER — Other Ambulatory Visit (HOSPITAL_BASED_OUTPATIENT_CLINIC_OR_DEPARTMENT_OTHER): Payer: Self-pay

## 2021-02-11 MED ORDER — SERTRALINE HCL 50 MG PO TABS
ORAL_TABLET | Freq: Every day | ORAL | 1 refills | Status: DC
  Filled 2021-02-11: qty 90, 90d supply, fill #0

## 2021-02-11 MED ORDER — MELOXICAM 15 MG PO TABS
ORAL_TABLET | Freq: Every day | ORAL | 2 refills | Status: DC
  Filled 2021-02-11: qty 30, 30d supply, fill #0
  Filled 2021-03-12: qty 30, 30d supply, fill #1
  Filled 2021-04-16: qty 30, 30d supply, fill #2

## 2021-02-12 ENCOUNTER — Other Ambulatory Visit (HOSPITAL_BASED_OUTPATIENT_CLINIC_OR_DEPARTMENT_OTHER): Payer: Self-pay

## 2021-02-12 NOTE — Telephone Encounter (Signed)
Medication: denosumab (PROLIA) injection 60 mg Prior authorization determination received Medication has been approved Approval dates: 06/12/2020-03/14/2022  Patient aware via: [place] aware: Yes Provider aware via this encounter

## 2021-02-17 ENCOUNTER — Other Ambulatory Visit (HOSPITAL_BASED_OUTPATIENT_CLINIC_OR_DEPARTMENT_OTHER): Payer: Self-pay

## 2021-02-17 MED ORDER — METHYLPREDNISOLONE 4 MG PO TBPK
ORAL_TABLET | ORAL | 0 refills | Status: DC
  Filled 2021-02-17: qty 21, 6d supply, fill #0

## 2021-02-19 DIAGNOSIS — H35372 Puckering of macula, left eye: Secondary | ICD-10-CM | POA: Diagnosis not present

## 2021-02-23 ENCOUNTER — Other Ambulatory Visit (HOSPITAL_BASED_OUTPATIENT_CLINIC_OR_DEPARTMENT_OTHER): Payer: Self-pay

## 2021-02-23 ENCOUNTER — Other Ambulatory Visit: Payer: Self-pay

## 2021-02-23 ENCOUNTER — Other Ambulatory Visit: Payer: Self-pay | Admitting: Family Medicine

## 2021-02-23 DIAGNOSIS — J3081 Allergic rhinitis due to animal (cat) (dog) hair and dander: Secondary | ICD-10-CM | POA: Diagnosis not present

## 2021-02-23 DIAGNOSIS — J301 Allergic rhinitis due to pollen: Secondary | ICD-10-CM | POA: Diagnosis not present

## 2021-02-23 DIAGNOSIS — J3089 Other allergic rhinitis: Secondary | ICD-10-CM | POA: Diagnosis not present

## 2021-02-23 MED ORDER — BUPROPION HCL ER (XL) 150 MG PO TB24
ORAL_TABLET | Freq: Every morning | ORAL | 1 refills | Status: DC
  Filled 2021-02-23 – 2021-03-26 (×4): qty 90, 30d supply, fill #0
  Filled 2021-03-26: qty 90, 30d supply, fill #1

## 2021-02-23 MED ORDER — METHOCARBAMOL 500 MG PO TABS
ORAL_TABLET | ORAL | 0 refills | Status: DC
  Filled 2021-02-23: qty 45, 12d supply, fill #0

## 2021-02-27 ENCOUNTER — Ambulatory Visit (INDEPENDENT_AMBULATORY_CARE_PROVIDER_SITE_OTHER): Payer: Medicare PPO | Admitting: Podiatry

## 2021-02-27 ENCOUNTER — Other Ambulatory Visit: Payer: Self-pay

## 2021-02-27 ENCOUNTER — Encounter: Payer: Self-pay | Admitting: Podiatry

## 2021-02-27 DIAGNOSIS — M810 Age-related osteoporosis without current pathological fracture: Secondary | ICD-10-CM | POA: Diagnosis not present

## 2021-02-27 DIAGNOSIS — L603 Nail dystrophy: Secondary | ICD-10-CM | POA: Diagnosis not present

## 2021-02-27 DIAGNOSIS — M79674 Pain in right toe(s): Secondary | ICD-10-CM

## 2021-02-27 DIAGNOSIS — G8929 Other chronic pain: Secondary | ICD-10-CM

## 2021-02-27 NOTE — Progress Notes (Signed)
Subjective: 70 year old female presents the office today for follow-up evaluation of nail dystrophy .Hisotry of traumatic nail avulsion on the right.  She is the right side is healed.   She still been using the urea nail gel occasionally.   Relates nails have been doing fine.  No swelling or redness or any drainage the toenail sites.  She has no other concerns today.  Objective: AAO x3, NAD DP/PT pulses palpable bilaterally, CRT less than 3 seconds Right hallux nail bed is healed and the nail started to grow back in.  Overall the nails still appear to be hypertrophic, dystrophic with brown discoloration.  No hyperpigmented changes.  No edema, erythema or signs of infection.  Hammertoes are present. No pain with calf compression, swelling, warmth, erythema  Assessment: Onychodystrophy  Plan: -All treatment options discussed with the patient including all alternatives, risks, complications.  -As a courtesy debride the nails with any complications or bleeding.  I continue with urea nail gel -Patient encouraged to call the office with any questions, concerns, change in symptoms.   Lorenda Peck DPM

## 2021-02-28 LAB — COMPLETE METABOLIC PANEL WITH GFR
AG Ratio: 2.1 (calc) (ref 1.0–2.5)
ALT: 30 U/L — ABNORMAL HIGH (ref 6–29)
AST: 33 U/L (ref 10–35)
Albumin: 4 g/dL (ref 3.6–5.1)
Alkaline phosphatase (APISO): 83 U/L (ref 37–153)
BUN: 24 mg/dL (ref 7–25)
CO2: 29 mmol/L (ref 20–32)
Calcium: 9 mg/dL (ref 8.6–10.4)
Chloride: 107 mmol/L (ref 98–110)
Creat: 0.82 mg/dL (ref 0.60–1.00)
Globulin: 1.9 g/dL (calc) (ref 1.9–3.7)
Glucose, Bld: 83 mg/dL (ref 65–99)
Potassium: 3.9 mmol/L (ref 3.5–5.3)
Sodium: 142 mmol/L (ref 135–146)
Total Bilirubin: 0.5 mg/dL (ref 0.2–1.2)
Total Protein: 5.9 g/dL — ABNORMAL LOW (ref 6.1–8.1)
eGFR: 76 mL/min/{1.73_m2} (ref >=60)

## 2021-03-01 NOTE — Progress Notes (Signed)
Your lab work is within acceptable range and there are no concerning findings.   ?

## 2021-03-02 ENCOUNTER — Other Ambulatory Visit (HOSPITAL_BASED_OUTPATIENT_CLINIC_OR_DEPARTMENT_OTHER): Payer: Self-pay

## 2021-03-04 NOTE — Telephone Encounter (Signed)
Lab work is in acceptable range so we can proceed with the Prolia injection. Spoke with pt and she said she wants to wait and get the injection when she comes in for her OV with Dr. Madilyn Fireman on 03/10/2021

## 2021-03-09 ENCOUNTER — Encounter: Payer: Self-pay | Admitting: Family Medicine

## 2021-03-10 ENCOUNTER — Ambulatory Visit: Payer: Medicare PPO | Admitting: Family Medicine

## 2021-03-12 ENCOUNTER — Other Ambulatory Visit (HOSPITAL_BASED_OUTPATIENT_CLINIC_OR_DEPARTMENT_OTHER): Payer: Self-pay

## 2021-03-13 ENCOUNTER — Encounter: Payer: Self-pay | Admitting: Medical-Surgical

## 2021-03-13 ENCOUNTER — Other Ambulatory Visit (HOSPITAL_BASED_OUTPATIENT_CLINIC_OR_DEPARTMENT_OTHER): Payer: Self-pay

## 2021-03-13 ENCOUNTER — Telehealth (INDEPENDENT_AMBULATORY_CARE_PROVIDER_SITE_OTHER): Payer: Medicare PPO | Admitting: Medical-Surgical

## 2021-03-13 VITALS — Temp 97.3°F | Resp 16 | Ht 66.5 in | Wt 125.0 lb

## 2021-03-13 DIAGNOSIS — J40 Bronchitis, not specified as acute or chronic: Secondary | ICD-10-CM | POA: Diagnosis not present

## 2021-03-13 DIAGNOSIS — J329 Chronic sinusitis, unspecified: Secondary | ICD-10-CM | POA: Diagnosis not present

## 2021-03-13 MED ORDER — IPRATROPIUM BROMIDE 0.03 % NA SOLN
2.0000 | Freq: Two times a day (BID) | NASAL | 0 refills | Status: DC
  Filled 2021-03-13: qty 30, 75d supply, fill #0

## 2021-03-13 MED ORDER — CEFDINIR 300 MG PO CAPS
300.0000 mg | ORAL_CAPSULE | Freq: Two times a day (BID) | ORAL | 0 refills | Status: DC
Start: 2021-03-13 — End: 2021-03-18
  Filled 2021-03-13: qty 14, 7d supply, fill #0

## 2021-03-13 MED ORDER — BENZONATATE 200 MG PO CAPS
200.0000 mg | ORAL_CAPSULE | Freq: Three times a day (TID) | ORAL | 0 refills | Status: DC | PRN
  Filled 2021-03-13: qty 45, 15d supply, fill #0

## 2021-03-13 NOTE — Progress Notes (Signed)
Virtual Visit via Video Note  I connected with Robin Conrad on 03/13/21 at 11:10 AM EST by a video enabled telemedicine application and verified that I am speaking with the correct person using two identifiers.   I discussed the limitations of evaluation and management by telemedicine and the availability of in person appointments. The patient expressed understanding and agreed to proceed.  Patient location: home Provider locations: office  Subjective:    CC: upper respiratory symptoms  HPI: Pleasant 70 year old female presenting via telephone visit due to technical difficulties using video platform.  Notes that she started getting sick on Friday with a mild cough with some sinus congestion.  Her symptoms started abruptly and gradually worsened over the weekend.  On Wednesday this past week she got a little better but then promptly got worse again.  Today she reports continued cough, dry mouth, rhinorrhea, profuse yellow nasal discharge, and generalized malaise.  No fever, chills, chest pain, shortness of breath, or GI symptoms.  Has been taking Robitussin-DM and Benadryl over-the-counter with minimal relief of symptoms.  Past medical history, Surgical history, Family history not pertinant except as noted below, Social history, Allergies, and medications have been entered into the medical record, reviewed, and corrections made.   Review of Systems: See HPI for pertinent positives and negatives.   Objective:    General: Speaking clearly in complete sentences without any shortness of breath.  Alert and oriented x3.  Normal judgment. No apparent acute distress.  Impression and Recommendations:    1. Sinobronchitis Due to difficulty swallowing pills, we will avoid Augmentin.  Sending in cefdinir 300 mg twice daily x7 days.  Tessalon Perles 200 mg every 8 hours as needed for cough.  Atrovent nasal spray for rhinorrhea.  Continue conservative treatment at home including rest, hydration,  humidification, and over-the-counter cold/flu agents.  I discussed the assessment and treatment plan with the patient. The patient was provided an opportunity to ask questions and all were answered. The patient agreed with the plan and demonstrated an understanding of the instructions.   The patient was advised to call back or seek an in-person evaluation if the symptoms worsen or if the condition fails to improve as anticipated.  25 minutes of non-face-to-face time was provided during this encounter.  Return if symptoms worsen or fail to improve.  Clearnce Sorrel, DNP, APRN, FNP-BC [facility]

## 2021-03-18 ENCOUNTER — Encounter: Payer: Self-pay | Admitting: Family Medicine

## 2021-03-18 ENCOUNTER — Ambulatory Visit (INDEPENDENT_AMBULATORY_CARE_PROVIDER_SITE_OTHER): Payer: Medicare (Managed Care) | Admitting: Family Medicine

## 2021-03-18 ENCOUNTER — Other Ambulatory Visit (HOSPITAL_BASED_OUTPATIENT_CLINIC_OR_DEPARTMENT_OTHER): Payer: Self-pay

## 2021-03-18 ENCOUNTER — Other Ambulatory Visit: Payer: Self-pay

## 2021-03-18 VITALS — BP 132/63 | HR 94 | Temp 97.9°F | Resp 16 | Ht 66.5 in | Wt 128.6 lb

## 2021-03-18 DIAGNOSIS — G589 Mononeuropathy, unspecified: Secondary | ICD-10-CM | POA: Insufficient documentation

## 2021-03-18 DIAGNOSIS — M797 Fibromyalgia: Secondary | ICD-10-CM

## 2021-03-18 DIAGNOSIS — M7541 Impingement syndrome of right shoulder: Secondary | ICD-10-CM | POA: Diagnosis not present

## 2021-03-18 DIAGNOSIS — M47812 Spondylosis without myelopathy or radiculopathy, cervical region: Secondary | ICD-10-CM | POA: Diagnosis not present

## 2021-03-18 MED ORDER — METHOCARBAMOL 500 MG PO TABS
500.0000 mg | ORAL_TABLET | Freq: Two times a day (BID) | ORAL | 0 refills | Status: DC | PRN
  Filled 2021-03-18: qty 90, 45d supply, fill #0

## 2021-03-18 NOTE — Assessment & Plan Note (Signed)
More recently dealing with some right sided neck pain.  Continue conservative care.
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
## 2021-03-18 NOTE — Assessment & Plan Note (Signed)
Will continue Neurontin.

## 2021-03-18 NOTE — Assessment & Plan Note (Addendum)
Stable on her current regimen she currently uses a half a tab of methocarbamol at bedtime and sometimes a half a tab midday it does really help her.  She says if she takes a whole tab though she feels dizzy.  She is also using gabapentin more for the neuropathy on her right side.  And that is helpful as well.  Also on meloxicam and tramadol.

## 2021-03-20 ENCOUNTER — Ambulatory Visit (INDEPENDENT_AMBULATORY_CARE_PROVIDER_SITE_OTHER): Payer: Medicare (Managed Care) | Admitting: Family Medicine

## 2021-03-20 ENCOUNTER — Other Ambulatory Visit: Payer: Self-pay

## 2021-03-20 VITALS — BP 121/57 | HR 86 | Temp 97.0°F

## 2021-03-20 DIAGNOSIS — M810 Age-related osteoporosis without current pathological fracture: Secondary | ICD-10-CM | POA: Diagnosis not present

## 2021-03-20 MED ORDER — DENOSUMAB 60 MG/ML ~~LOC~~ SOSY
60.0000 mg | PREFILLED_SYRINGE | Freq: Once | SUBCUTANEOUS | Status: AC
  Administered 2021-03-20: 60 mg via SUBCUTANEOUS

## 2021-03-20 NOTE — Patient Instructions (Signed)
Denosumab injection What is this medication? DENOSUMAB (den oh sue mab) slows bone breakdown. Prolia is used to treat osteoporosis in women after menopause and in men, and in people who are taking corticosteroids for 6 months or more. Delton See is used to treat a high calcium level due to cancer and to prevent bone fractures and other bone problems caused by multiple myeloma or cancer bone metastases. Delton See is also used to treat giant cell tumor of the bone. This medicine may be used for other purposes; ask your health care provider or pharmacist if you have questions. COMMON BRAND NAME(S): Prolia, XGEVA What should I tell my care team before I take this medication? They need to know if you have any of these conditions: dental disease having surgery or tooth extraction infection kidney disease low levels of calcium or Vitamin D in the blood malnutrition on hemodialysis skin conditions or sensitivity thyroid or parathyroid disease an unusual reaction to denosumab, other medicines, foods, dyes, or preservatives pregnant or trying to get pregnant breast-feeding How should I use this medication? This medicine is for injection under the skin. It is given by a health care professional in a hospital or clinic setting. A special MedGuide will be given to you before each treatment. Be sure to read this information carefully each time. For Prolia, talk to your pediatrician regarding the use of this medicine in children. Special care may be needed. For Delton See, talk to your pediatrician regarding the use of this medicine in children. While this drug may be prescribed for children as young as 13 years for selected conditions, precautions do apply. Overdosage: If you think you have taken too much of this medicine contact a poison control center or emergency room at once. NOTE: This medicine is only for you. Do not share this medicine with others. What if I miss a dose? It is important not to miss your dose.  Call your doctor or health care professional if you are unable to keep an appointment. What may interact with this medication? Do not take this medicine with any of the following medications: other medicines containing denosumab This medicine may also interact with the following medications: medicines that lower your chance of fighting infection steroid medicines like prednisone or cortisone This list may not describe all possible interactions. Give your health care provider a list of all the medicines, herbs, non-prescription drugs, or dietary supplements you use. Also tell them if you smoke, drink alcohol, or use illegal drugs. Some items may interact with your medicine. What should I watch for while using this medication? Visit your doctor or health care professional for regular checks on your progress. Your doctor or health care professional may order blood tests and other tests to see how you are doing. Call your doctor or health care professional for advice if you get a fever, chills or sore throat, or other symptoms of a cold or flu. Do not treat yourself. This drug may decrease your body's ability to fight infection. Try to avoid being around people who are sick. You should make sure you get enough calcium and vitamin D while you are taking this medicine, unless your doctor tells you not to. Discuss the foods you eat and the vitamins you take with your health care professional. See your dentist regularly. Brush and floss your teeth as directed. Before you have any dental work done, tell your dentist you are receiving this medicine. Do not become pregnant while taking this medicine or for 5 months after  stopping it. Talk with your doctor or health care professional about your birth control options while taking this medicine. Women should inform their doctor if they wish to become pregnant or think they might be pregnant. There is a potential for serious side effects to an unborn child. Talk to  your health care professional or pharmacist for more information. What side effects may I notice from receiving this medication? Side effects that you should report to your doctor or health care professional as soon as possible: allergic reactions like skin rash, itching or hives, swelling of the face, lips, or tongue bone pain breathing problems dizziness jaw pain, especially after dental work redness, blistering, peeling of the skin signs and symptoms of infection like fever or chills; cough; sore throat; pain or trouble passing urine signs of low calcium like fast heartbeat, muscle cramps or muscle pain; pain, tingling, numbness in the hands or feet; seizures unusual bleeding or bruising unusually weak or tired Side effects that usually do not require medical attention (report to your doctor or health care professional if they continue or are bothersome): constipation diarrhea headache joint pain loss of appetite muscle pain runny nose tiredness upset stomach This list may not describe all possible side effects. Call your doctor for medical advice about side effects. You may report side effects to FDA at 1-800-FDA-1088. Where should I keep my medication? This medicine is only given in a clinic, doctor's office, or other health care setting and will not be stored at home. NOTE: This sheet is a summary. It may not cover all possible information. If you have questions about this medicine, talk to your doctor, pharmacist, or health care provider.  2022 Elsevier/Gold Standard (2017-07-08 00:00:00)

## 2021-03-20 NOTE — Progress Notes (Signed)
Patient presents today as a nurse visit for Prolia 60 mg/1 ml injection. Patient is scheduled to get this injection every 6 months. Patients last injection was given on 06/17/2020 in the right arm. Patients last CMP was done on 02/27/2021.  Patient denies falls, CP, palpitations, ShOB, dizziness/lightheadedness, abdominal pain, headache, and mood swings.   Injection given in left arm. Pt tolerated injection well without complications. Pt instructed to schedule a nurse visit for the next injection that will be due in 6 months with lab work to be done prior.  Orders for CMP w/GFR entered and set as future to be done in 6 months. Post dated MyChart message sent to pt reminding her to come in for the labs and injection.

## 2021-03-21 ENCOUNTER — Telehealth: Payer: Self-pay

## 2021-03-21 NOTE — Telephone Encounter (Signed)
Medication: methocarbamol (ROBAXIN) 500 MG tablet Prior authorization submitted via CoverMyMeds on 03/21/2021 [place] submission pending

## 2021-03-23 ENCOUNTER — Other Ambulatory Visit (HOSPITAL_BASED_OUTPATIENT_CLINIC_OR_DEPARTMENT_OTHER): Payer: Self-pay

## 2021-03-23 NOTE — Telephone Encounter (Signed)
Medication: methocarbamol (ROBAXIN) 500 MG tablet Prior authorization determination received Medication has been approved Approval dates: 03/15/2021-03/21/2022  Patient aware via: [name] aware: Yes Provider aware via this encounter

## 2021-03-24 DIAGNOSIS — C44529 Squamous cell carcinoma of skin of other part of trunk: Secondary | ICD-10-CM | POA: Diagnosis not present

## 2021-03-24 DIAGNOSIS — L649 Androgenic alopecia, unspecified: Secondary | ICD-10-CM | POA: Diagnosis not present

## 2021-03-25 ENCOUNTER — Telehealth: Payer: Self-pay

## 2021-03-25 NOTE — Telephone Encounter (Signed)
Medication: denosumab (PROLIA) injection 60 mg Automatic authorization extension received from [place] dates: 03/15/2021-03/14/2022  Patient aware via: MyChart Provider aware via this encounter

## 2021-03-26 ENCOUNTER — Other Ambulatory Visit (HOSPITAL_BASED_OUTPATIENT_CLINIC_OR_DEPARTMENT_OTHER): Payer: Self-pay

## 2021-03-26 ENCOUNTER — Other Ambulatory Visit: Payer: Self-pay

## 2021-03-26 DIAGNOSIS — Z791 Long term (current) use of non-steroidal anti-inflammatories (NSAID): Secondary | ICD-10-CM | POA: Diagnosis not present

## 2021-03-26 DIAGNOSIS — M797 Fibromyalgia: Secondary | ICD-10-CM | POA: Diagnosis not present

## 2021-03-26 DIAGNOSIS — M351 Other overlap syndromes: Secondary | ICD-10-CM | POA: Diagnosis not present

## 2021-03-31 DIAGNOSIS — D045 Carcinoma in situ of skin of trunk: Secondary | ICD-10-CM | POA: Diagnosis not present

## 2021-04-02 ENCOUNTER — Other Ambulatory Visit: Payer: Self-pay

## 2021-04-06 ENCOUNTER — Telehealth: Payer: Self-pay

## 2021-04-06 ENCOUNTER — Ambulatory Visit (INDEPENDENT_AMBULATORY_CARE_PROVIDER_SITE_OTHER): Payer: Medicare (Managed Care) | Admitting: Family Medicine

## 2021-04-06 ENCOUNTER — Other Ambulatory Visit (HOSPITAL_BASED_OUTPATIENT_CLINIC_OR_DEPARTMENT_OTHER): Payer: Self-pay

## 2021-04-06 DIAGNOSIS — Z780 Asymptomatic menopausal state: Secondary | ICD-10-CM | POA: Diagnosis not present

## 2021-04-06 DIAGNOSIS — Z1231 Encounter for screening mammogram for malignant neoplasm of breast: Secondary | ICD-10-CM

## 2021-04-06 DIAGNOSIS — Z0000 Encounter for general adult medical examination without abnormal findings: Secondary | ICD-10-CM

## 2021-04-06 MED ORDER — METAXALONE 400 MG PO TABS
800.0000 mg | ORAL_TABLET | Freq: Three times a day (TID) | ORAL | 0 refills | Status: DC | PRN
  Filled 2021-04-06: qty 60, 10d supply, fill #0

## 2021-04-06 NOTE — Telephone Encounter (Signed)
Meds ordered this encounter  Medications   metaxalone (SKELAXIN) 400 MG tablet    Sig: Take 2 tablets (800 mg total) by mouth 3 (three) times daily as needed for muscle spasms.    Dispense:  60 tablet    Refill:  0

## 2021-04-06 NOTE — Patient Instructions (Addendum)
[facility] Maintenance Summary and Written Plan of Care  Ms. Robin Conrad ,  Thank you for allowing me to perform your Medicare Annual Wellness Visit and for your ongoing commitment to your health.   Health Maintenance & Immunization History Health Maintenance  Topic Date Due   Fecal DNA (Cologuard)  04/30/2022   MAMMOGRAM  05/08/2022   TETANUS/TDAP  04/15/2027   Pneumonia Vaccine 72+ Years old  Completed   INFLUENZA VACCINE  Completed   DEXA SCAN  Completed   COVID-19 Vaccine  Completed   Hepatitis C Screening  Completed   Zoster Vaccines- Shingrix  Completed   HPV VACCINES  Aged Out   Immunization History  Administered Date(s) Administered   Fluad Quad(high Dose 65+) 11/21/2018, 12/07/2019, 12/16/2020   Influenza Whole 11/25/2010   Influenza, High Dose Seasonal PF 12/22/2016   Influenza, Seasonal, Injecte, Preservative Fre 02/15/2012   Influenza,inj,Quad PF,6+ Mos 12/28/2012, 11/15/2013, 11/21/2014, 12/09/2015, 10/31/2017   Influenza-Unspecified 11/21/2018, 12/07/2019   Moderna Covid-19 Vaccine Bivalent Booster 9yrs & up 12/16/2020   Moderna SARS-COV2 Booster Vaccination 07/23/2020   PFIZER(Purple Top)SARS-COV-2 Vaccination 04/14/2019, 05/15/2019, 01/03/2020   Pneumococcal Conjugate-13 05/21/2015   Pneumococcal Polysaccharide-23 12/08/2009, 06/24/2016   Tdap 03/16/2007, 04/14/2017   Zoster Recombinat (Shingrix) 11/16/2017, 04/07/2018   Zoster, Live 12/08/2009    These are the patient goals that we discussed:  Goals Addressed               This Visit's Progress     Patient Stated (pt-stated)        04/06/2021 AWV Goal: Exercise for General Health  Patient will verbalize understanding of the benefits of increased physical activity: Exercising regularly is important. It will improve your overall fitness, flexibility, and endurance. Regular exercise also will improve your overall health. It can help you control your weight, reduce stress, and  improve your bone density. Over the next year, patient will increase physical activity as tolerated with a goal of at least 150 minutes of moderate physical activity per week.  You can tell that you are exercising at a moderate intensity if your heart starts beating faster and you start breathing faster but can still hold a conversation. Moderate-intensity exercise ideas include: Walking 1 mile (1.6 km) in about 15 minutes Biking Hiking Golfing Dancing Water aerobics Patient will verbalize understanding of everyday activities that increase physical activity by providing examples like the following: Yard work, such as: Sales promotion account executive Gardening Washing windows or floors Patient will be able to explain general safety guidelines for exercising:  Before you start a new exercise program, talk with your health care provider. Do not exercise so much that you hurt yourself, feel dizzy, or get very short of breath. Wear comfortable clothes and wear shoes with good support. Drink plenty of water while you exercise to prevent dehydration or heat stroke. Work out until your breathing and your heartbeat get faster.          This is a list of Health Maintenance Items that are overdue or due now: Screening mammography Bone densitometry screening  Mammogram due in February  Orders/Referrals Placed Today: Orders Placed This Encounter  Procedures   Mammogram 3D SCREEN BREAST BILATERAL    Standing Status:   Future    Standing Expiration Date:   04/06/2022    Scheduling Instructions:     Please call patient to schedule.    Order Specific Question:  Reason for Exam (SYMPTOM  OR DIAGNOSIS REQUIRED)    Answer:   Breast cancer screening.    Order Specific Question:   Preferred imaging location?    Answer:   [facility]    Standing Status:   Future    Standing Expiration Date:   04/06/2022     Scheduling Instructions:     Please call patient to schedule. Would like it to be on the same day as her mammogram.    Order Specific Question:   Reason for exam:    Answer:   Post menopausal    Order Specific Question:   Preferred imaging location?    Answer:   [facility]   (Contact our referral department at [phone] if you have not spoken with someone about your referral appointment within the next 5 days)    Follow-up Plan Follow-up with Hali Marry, MD as planned Medicare wellness visit in one year. Patient will access AVS on my chart.    Health Maintenance, Female Adopting a healthy lifestyle and getting preventive care are important in promoting health and wellness. Ask your health care provider about: The right schedule for you to have regular tests and exams. Things you can do on your own to prevent diseases and keep yourself healthy. What should I know about diet, weight, and exercise? Eat a healthy diet  Eat a diet that includes plenty of vegetables, fruits, low-fat dairy products, and lean protein. Do not eat a lot of foods that are high in solid fats, added sugars, or sodium. Maintain a healthy weight Body mass index (BMI) is used to identify weight problems. It estimates body fat based on height and weight. Your health care provider can help determine your BMI and help you achieve or maintain a healthy weight. Get regular exercise Get regular exercise. This is one of the most important things you can do for your health. Most adults should: Exercise for at least 150 minutes each week. The exercise should increase your heart rate and make you sweat (moderate-intensity exercise). Do strengthening exercises at least twice a week. This is in addition to the moderate-intensity exercise. Spend less time sitting. Even light physical activity can be beneficial. Watch cholesterol and blood lipids Have your blood tested for lipids and cholesterol at  72 years of age, then have this test every 5 years. Have your cholesterol levels checked more often if: Your lipid or cholesterol levels are high. You are older than 72 years of age. You are at high risk for heart disease. What should I know about cancer screening? Depending on your health history and family history, you may need to have cancer screening at various ages. This may include screening for: Breast cancer. Cervical cancer. Colorectal cancer. Skin cancer. Lung cancer. What should I know about heart disease, diabetes, and high blood pressure? Blood pressure and heart disease High blood pressure causes heart disease and increases the risk of stroke. This is more likely to develop in people who have high blood pressure readings or are overweight. Have your blood pressure checked: Every 3-5 years if you are 57-23 years of age. Every year if you are 77 years old or older. Diabetes Have regular diabetes screenings. This checks your fasting blood sugar level. Have the screening done: Once every three years after age 24 if you are at a normal weight and have a low risk for diabetes. More often and at a younger age if you are overweight or have a  high risk for diabetes. What should I know about preventing infection? Hepatitis B If you have a higher risk for hepatitis B, you should be screened for this virus. Talk with your health care provider to find out if you are at risk for hepatitis B infection. Hepatitis C Testing is recommended for: Everyone born from [birth year] through 1966. Anyone with known risk factors for hepatitis C. Sexually transmitted infections (STIs) Get screened for STIs, including gonorrhea and chlamydia, if: You are sexually active and are younger than 72 years of age. You are older than 73 years of age and your health care provider tells you that you are at risk for this type of infection. Your sexual activity has changed since you were last screened, and you are at  increased risk for chlamydia or gonorrhea. Ask your health care provider if you are at risk. Ask your health care provider about whether you are at high risk for HIV. Your health care provider may recommend a prescription medicine to help prevent HIV infection. If you choose to take medicine to prevent HIV, you should first get tested for HIV. You should then be tested every 3 months for as long as you are taking the medicine. Pregnancy If you are about to stop having your period (premenopausal) and you may become pregnant, seek counseling before you get pregnant. Take 400 to 800 micrograms (mcg) of folic acid every day if you become pregnant. Ask for birth control (contraception) if you want to prevent pregnancy. Osteoporosis and menopause Osteoporosis is a disease in which the bones lose minerals and strength with aging. This can result in bone fractures. If you are 84 years old or older, or if you are at risk for osteoporosis and fractures, ask your health care provider if you should: Be screened for bone loss. Take a calcium or vitamin D supplement to lower your risk of fractures. Be given hormone replacement therapy (HRT) to treat symptoms of menopause. Follow these instructions at home: Alcohol use Do not drink alcohol if: Your health care provider tells you not to drink. You are pregnant, may be pregnant, or are planning to become pregnant. If you drink alcohol: Limit how much you have to: 0-1 drink a day. Know how much alcohol is in your drink. In the [place] one drink equals one 12 oz bottle of beer (355 mL), one 5 oz glass of wine (148 mL), or one 1 oz glass of hard liquor (44 mL). Lifestyle Do not use any products that contain nicotine or tobacco. These products include cigarettes, chewing tobacco, and vaping devices, such as e-cigarettes. If you need help quitting, ask your health care provider. Do not use street drugs. Do not share needles. Ask your health care provider for help  if you need support or information about quitting drugs. General instructions Schedule regular health, dental, and eye exams. Stay current with your vaccines. Tell your health care provider if: You often feel depressed. You have ever been abused or do not feel safe at home. Summary Adopting a healthy lifestyle and getting preventive care are important in promoting health and wellness. Follow your health care provider's instructions about healthy diet, exercising, and getting tested or screened for diseases. Follow your health care provider's instructions on monitoring your cholesterol and blood pressure. This information is not intended to replace advice given to you by your health care provider. Make sure you discuss any questions you have with your health care provider. Document Revised: 07/21/2020 Document Reviewed: 07/21/2020 Elsevier Patient  Education  2022 [employer].
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
## 2021-04-06 NOTE — Telephone Encounter (Signed)
Patient called stating her insurance will not cover Robaxin. Patient was advised to contact insurance company and ask what is covered. Patient returned call and stated she was having a hard time contacting the insurance company and requesting to have a different medication sent in. Forward to Dr. Madilyn Fireman.

## 2021-04-07 ENCOUNTER — Other Ambulatory Visit (HOSPITAL_BASED_OUTPATIENT_CLINIC_OR_DEPARTMENT_OTHER): Payer: Self-pay

## 2021-04-08 ENCOUNTER — Other Ambulatory Visit (HOSPITAL_BASED_OUTPATIENT_CLINIC_OR_DEPARTMENT_OTHER): Payer: Self-pay

## 2021-04-08 ENCOUNTER — Other Ambulatory Visit: Payer: Self-pay

## 2021-04-08 NOTE — Telephone Encounter (Signed)
Patient advised.

## 2021-04-16 ENCOUNTER — Other Ambulatory Visit (HOSPITAL_BASED_OUTPATIENT_CLINIC_OR_DEPARTMENT_OTHER): Payer: Self-pay

## 2021-04-16 ENCOUNTER — Telehealth: Payer: Self-pay

## 2021-04-16 ENCOUNTER — Other Ambulatory Visit: Payer: Self-pay | Admitting: Family Medicine

## 2021-04-16 NOTE — Telephone Encounter (Signed)
Routing to provider as an Pharmacist, hospital.   Patient left a vm msg stating that metaxalone requires prior authorization from [employer]. Please begin the process for prior authorization. Thanks in advance.

## 2021-04-17 ENCOUNTER — Telehealth: Payer: Self-pay

## 2021-04-17 NOTE — Telephone Encounter (Signed)
Received Notice of Approval  Approved 03/18/2021 - 04/17/2022  Fax placed in Metheney's basket

## 2021-04-17 NOTE — Telephone Encounter (Signed)
Initiated Prior authorization TKP:[id number] [facility]) 400  Via: Covermymeds Case/Key:[id number] Status: Approved as of 04/17/20 Reason:Pt has [employer] Coverage Start Date:03/18/2021;Coverage End Date:04/17/2022; Notified Pt via: Mychart

## 2021-04-20 ENCOUNTER — Other Ambulatory Visit (HOSPITAL_BASED_OUTPATIENT_CLINIC_OR_DEPARTMENT_OTHER): Payer: Self-pay

## 2021-04-21 ENCOUNTER — Other Ambulatory Visit (HOSPITAL_BASED_OUTPATIENT_CLINIC_OR_DEPARTMENT_OTHER): Payer: Self-pay

## 2021-04-21 ENCOUNTER — Other Ambulatory Visit: Payer: Self-pay | Admitting: Neurological Surgery

## 2021-04-27 ENCOUNTER — Other Ambulatory Visit (HOSPITAL_BASED_OUTPATIENT_CLINIC_OR_DEPARTMENT_OTHER): Payer: Self-pay

## 2021-04-27 ENCOUNTER — Other Ambulatory Visit: Payer: Self-pay | Admitting: Family Medicine

## 2021-04-27 ENCOUNTER — Other Ambulatory Visit: Payer: Self-pay

## 2021-04-27 MED ORDER — BUPROPION HCL ER (XL) 150 MG PO TB24
ORAL_TABLET | Freq: Every morning | ORAL | 1 refills | Status: DC
  Filled 2021-04-27: qty 90, 30d supply, fill #0
  Filled 2021-04-27: qty 90, 90d supply, fill #0
  Filled 2021-05-28: qty 90, 30d supply, fill #1

## 2021-04-29 ENCOUNTER — Other Ambulatory Visit: Payer: Self-pay

## 2021-04-29 ENCOUNTER — Other Ambulatory Visit: Payer: Self-pay | Admitting: Family Medicine

## 2021-04-30 ENCOUNTER — Other Ambulatory Visit (HOSPITAL_BASED_OUTPATIENT_CLINIC_OR_DEPARTMENT_OTHER): Payer: Self-pay

## 2021-04-30 MED ORDER — TRAMADOL HCL 50 MG PO TABS
100.0000 mg | ORAL_TABLET | Freq: Three times a day (TID) | ORAL | 2 refills | Status: DC | PRN
  Filled 2021-04-30: qty 60, 10d supply, fill #0
  Filled 2021-05-26: qty 60, 10d supply, fill #1
  Filled 2021-06-25: qty 60, 10d supply, fill #2

## 2021-05-13 ENCOUNTER — Ambulatory Visit: Payer: Medicare (Managed Care)

## 2021-05-13 ENCOUNTER — Other Ambulatory Visit: Payer: Medicare (Managed Care)

## 2021-05-13 ENCOUNTER — Other Ambulatory Visit: Payer: Self-pay

## 2021-05-13 ENCOUNTER — Ambulatory Visit (INDEPENDENT_AMBULATORY_CARE_PROVIDER_SITE_OTHER): Payer: Medicare (Managed Care)

## 2021-05-13 DIAGNOSIS — Z0000 Encounter for general adult medical examination without abnormal findings: Secondary | ICD-10-CM | POA: Diagnosis not present

## 2021-05-13 DIAGNOSIS — Z1231 Encounter for screening mammogram for malignant neoplasm of breast: Secondary | ICD-10-CM

## 2021-05-13 DIAGNOSIS — Z780 Asymptomatic menopausal state: Secondary | ICD-10-CM

## 2021-05-13 IMAGING — MG MM DIGITAL SCREENING BILAT W/ TOMO AND CAD
8 series · 9 of 24 positions shown · non-contrast
Comparison: Previous exam(s).

CLINICAL DATA: Screening.

EXAM:
DIGITAL SCREENING BILATERAL MAMMOGRAM WITH TOMOSYNTHESIS AND CAD
TECHNIQUE: Bilateral screening digital craniocaudal and mediolateral oblique
mammograms were obtained. Bilateral screening digital breast
tomosynthesis was performed. The images were evaluated with
computer-aided detection.

[R MLO synth-2D]
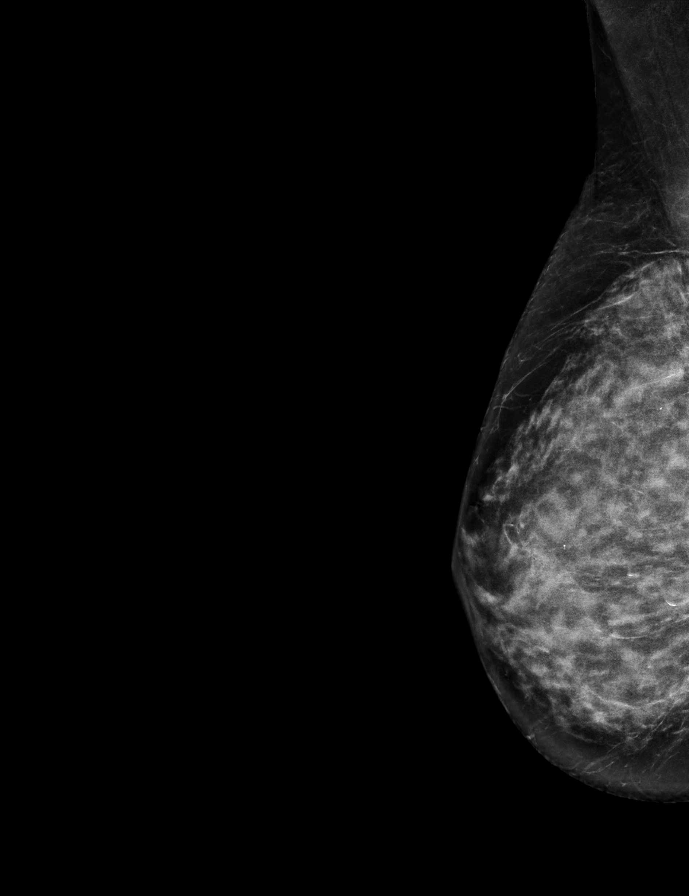

[R CC synth-2D]
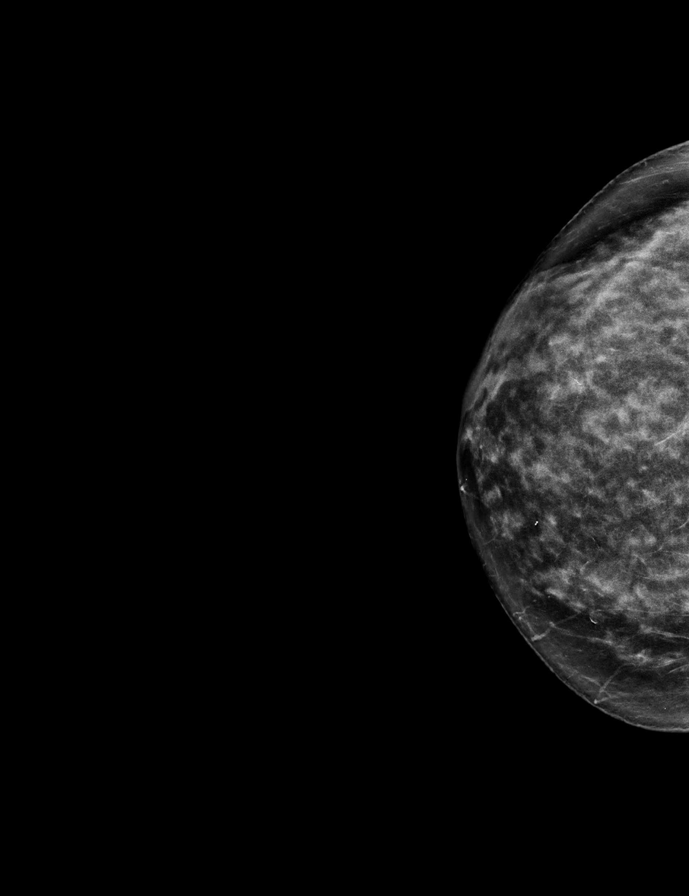

[L MLO synth-2D]
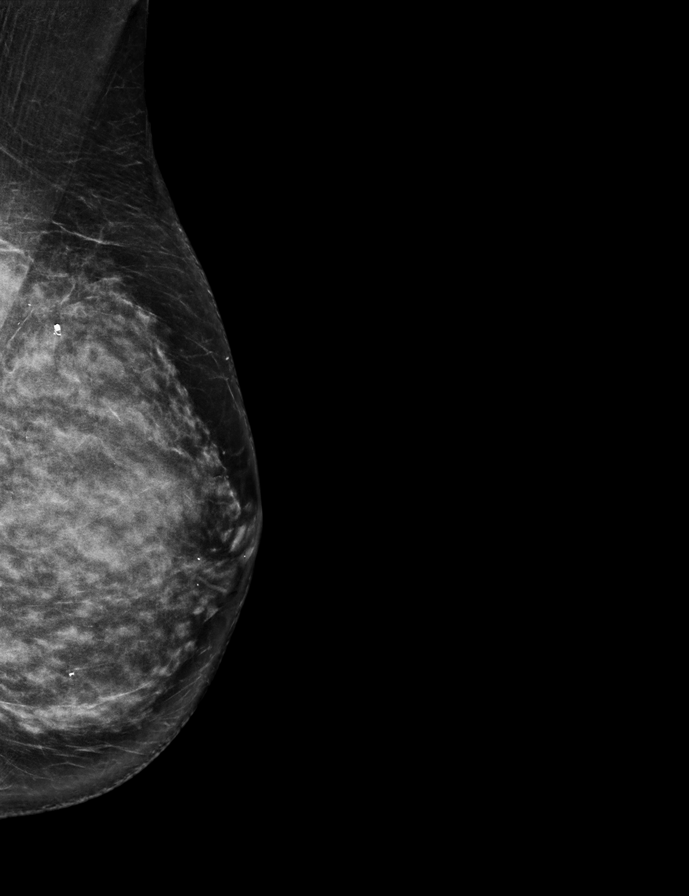

[L CC synth-2D]
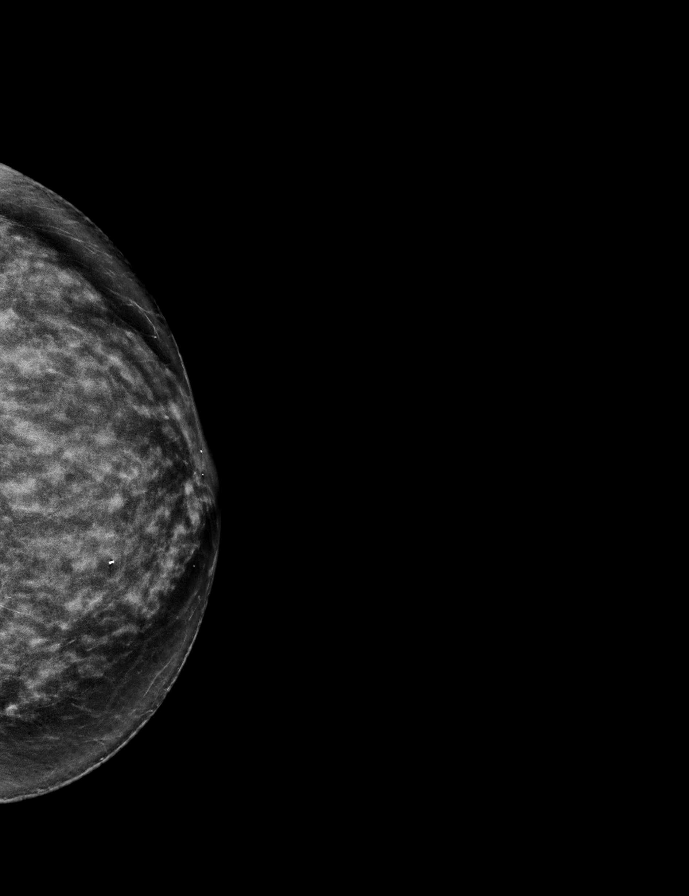

[R CC tomo · 2 of 58 frames shown]
[frame 19/58]
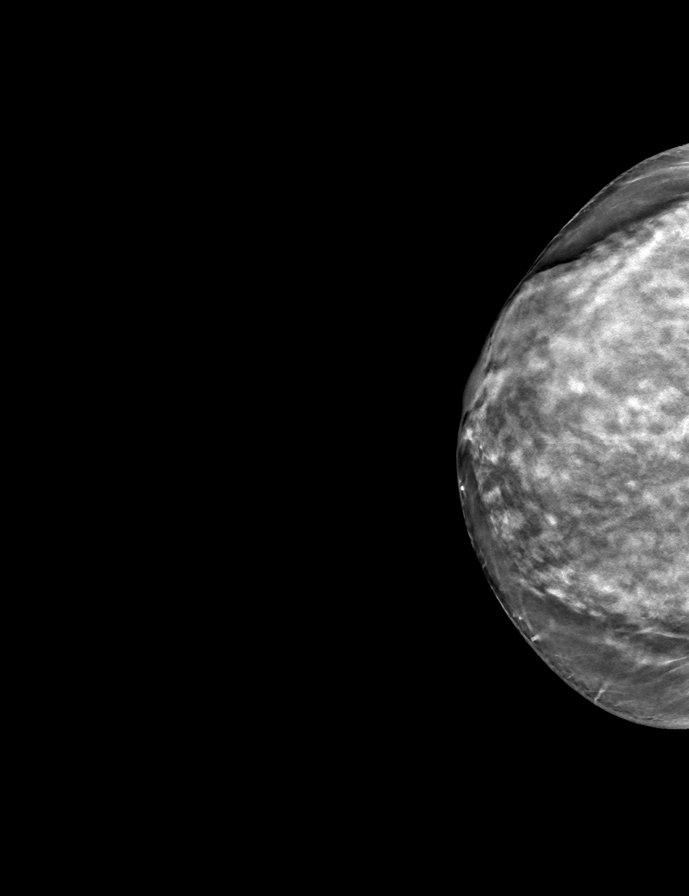
[frame 29/58]
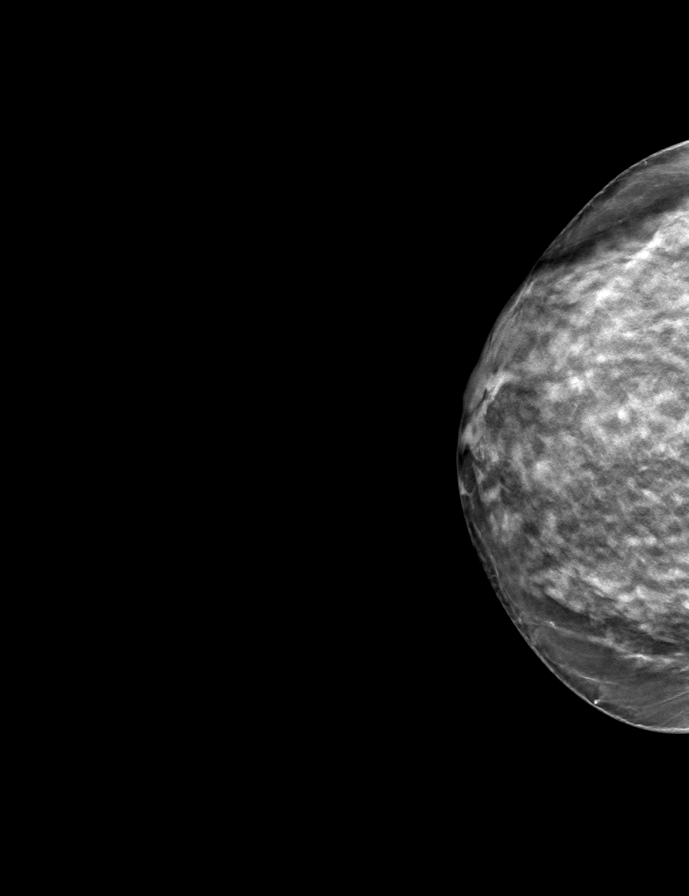

[L CC tomo · tomo slice 31/60.0]
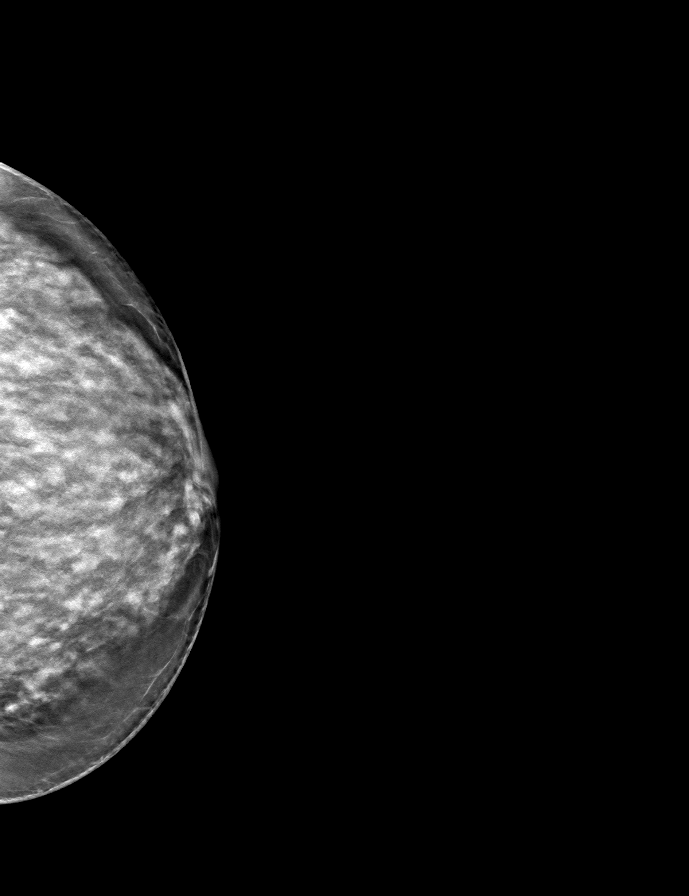

[R MLO tomo · tomo slice 31/61.0]
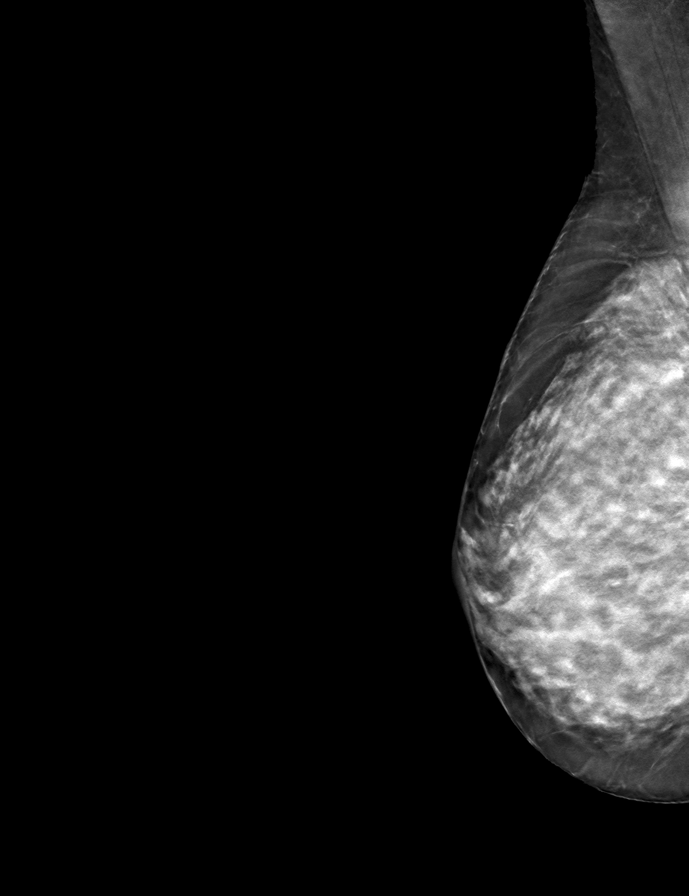

[L MLO tomo · tomo slice 33/64.0]
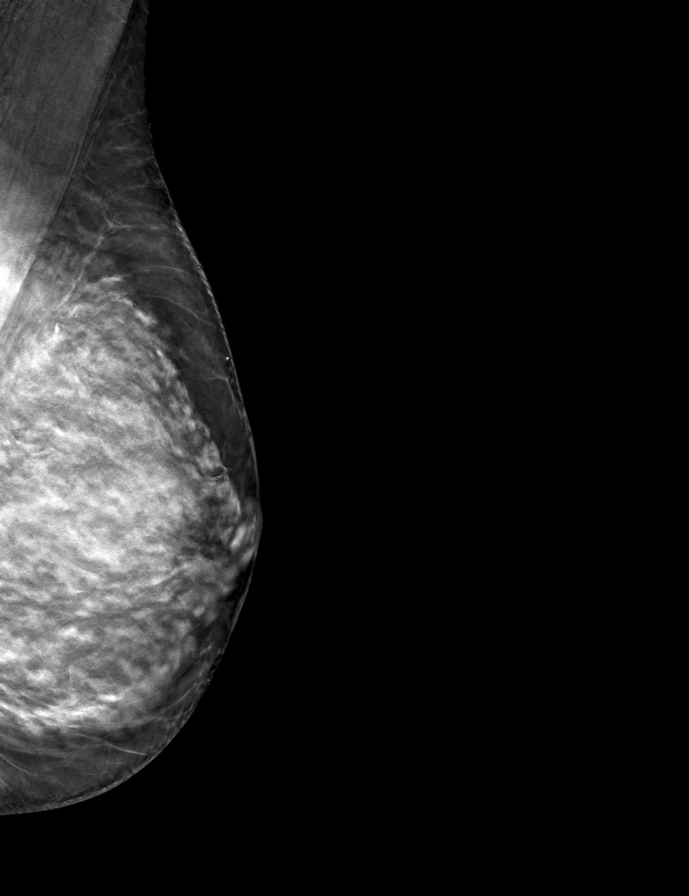

[9 of 24 positions shown; findings below may reference images not displayed]

ACR Breast Density Category d: The breast tissue is extremely dense,
which lowers the sensitivity of mammography
FINDINGS: There are no findings suspicious for malignancy.
IMPRESSION: No mammographic evidence of malignancy. A result letter of this
screening mammogram will be mailed directly to the patient.

RECOMMENDATION:
Screening mammogram in one year. (Code:[7P])

BI-RADS CATEGORY  1: Negative.

## 2021-05-13 NOTE — Progress Notes (Signed)
Your bone density shows mildly thin bones, also known as osteopenia.  Your T score has actually improved slightly compared to 2020. ? ?The current recommendation for osteopenia (mildly thin bones) treatment includes:  ? ?#1 calcium-total of 1200 mg of calcium daily.  If you eat a very calcium rich diet you may be able to obtain that without a supplement.  If not, then I recommend calcium 500 mg twice a day.  There are several products over-the-counter such as Caltrate D and Viactiv chews which are great options that contain calcium and vitamin D. ?#2 vitamin D-recommend 800 international units daily. ?#3 exercise-recommend 30 minutes of weightbearing exercise 3 days a week.  Resistance training ,such as doing bands and light weights, can be particularly helpful. ? ?

## 2021-05-13 NOTE — Progress Notes (Signed)
That is adequate intake.  I do not recommend any higher doses or it can increase the risk for kidney stones etc.

## 2021-05-14 NOTE — Progress Notes (Signed)
Please call patient. Normal mammogram.  Repeat in 1 year.  

## 2021-05-20 ENCOUNTER — Ambulatory Visit (INDEPENDENT_AMBULATORY_CARE_PROVIDER_SITE_OTHER): Payer: Medicare (Managed Care) | Admitting: Family Medicine

## 2021-05-20 ENCOUNTER — Other Ambulatory Visit: Payer: Self-pay

## 2021-05-20 ENCOUNTER — Encounter: Payer: Self-pay | Admitting: Family Medicine

## 2021-05-20 ENCOUNTER — Other Ambulatory Visit (HOSPITAL_BASED_OUTPATIENT_CLINIC_OR_DEPARTMENT_OTHER): Payer: Self-pay

## 2021-05-20 VITALS — BP 122/65 | HR 84 | Resp 16 | Ht 66.5 in | Wt 129.0 lb

## 2021-05-20 DIAGNOSIS — F411 Generalized anxiety disorder: Secondary | ICD-10-CM

## 2021-05-20 DIAGNOSIS — H8110 Benign paroxysmal vertigo, unspecified ear: Secondary | ICD-10-CM | POA: Diagnosis not present

## 2021-05-20 DIAGNOSIS — E039 Hypothyroidism, unspecified: Secondary | ICD-10-CM

## 2021-05-20 MED ORDER — LEVOTHYROXINE SODIUM 50 MCG PO TABS
50.0000 ug | ORAL_TABLET | Freq: Every day | ORAL | 2 refills | Status: DC
  Filled 2021-05-20: qty 90, 90d supply, fill #0
  Filled 2021-08-20: qty 90, 90d supply, fill #1
  Filled 2021-11-24: qty 90, 90d supply, fill #2

## 2021-05-20 MED ORDER — SERTRALINE HCL 50 MG PO TABS
50.0000 mg | ORAL_TABLET | Freq: Every day | ORAL | 2 refills | Status: DC
  Filled 2021-05-20: qty 90, 90d supply, fill #0
  Filled 2021-08-20: qty 90, 90d supply, fill #1
  Filled 2021-11-24: qty 90, 90d supply, fill #2

## 2021-05-20 NOTE — Addendum Note (Signed)
Addended by: Beatrice Lecher D on: 05/20/2021 12:38 PM ? ? Modules accepted: Orders ? ?
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
## 2021-05-20 NOTE — Assessment & Plan Note (Signed)
Plan to check TSH since she has noticed an increase in weight and fatigue. ?

## 2021-05-21 LAB — TSH: TSH: 1.61 mIU/L (ref 0.40–4.50)

## 2021-05-21 NOTE — Progress Notes (Signed)
Your lab work is within acceptable range and there are no concerning findings.   ?

## 2021-05-22 ENCOUNTER — Other Ambulatory Visit (HOSPITAL_BASED_OUTPATIENT_CLINIC_OR_DEPARTMENT_OTHER): Payer: Self-pay

## 2021-05-22 MED ORDER — FINASTERIDE 5 MG PO TABS
5.0000 mg | ORAL_TABLET | Freq: Every day | ORAL | 5 refills | Status: DC
  Filled 2021-05-22: qty 30, 30d supply, fill #0
  Filled 2021-06-25: qty 30, 30d supply, fill #1
  Filled 2021-07-23: qty 30, 30d supply, fill #2
  Filled 2021-08-20: qty 30, 30d supply, fill #3
  Filled 2021-09-14 – 2021-09-15 (×2): qty 30, 30d supply, fill #4
  Filled 2021-10-30: qty 30, 30d supply, fill #5

## 2021-05-25 ENCOUNTER — Other Ambulatory Visit (HOSPITAL_BASED_OUTPATIENT_CLINIC_OR_DEPARTMENT_OTHER): Payer: Self-pay

## 2021-05-25 ENCOUNTER — Other Ambulatory Visit: Payer: Self-pay | Admitting: Family Medicine

## 2021-05-25 ENCOUNTER — Other Ambulatory Visit: Payer: Self-pay | Admitting: Gastroenterology

## 2021-05-25 DIAGNOSIS — M797 Fibromyalgia: Secondary | ICD-10-CM | POA: Diagnosis not present

## 2021-05-25 DIAGNOSIS — Z791 Long term (current) use of non-steroidal anti-inflammatories (NSAID): Secondary | ICD-10-CM | POA: Diagnosis not present

## 2021-05-25 DIAGNOSIS — M351 Other overlap syndromes: Secondary | ICD-10-CM | POA: Diagnosis not present

## 2021-05-25 MED ORDER — FAMOTIDINE 20 MG PO TABS
ORAL_TABLET | ORAL | 1 refills | Status: DC
  Filled 2021-05-25: qty 90, 90d supply, fill #0
  Filled 2021-08-20: qty 90, 90d supply, fill #1

## 2021-05-25 MED ORDER — MELOXICAM 15 MG PO TABS
ORAL_TABLET | Freq: Every day | ORAL | 2 refills | Status: DC
  Filled 2021-05-25: qty 30, 30d supply, fill #0
  Filled 2021-06-25: qty 30, 30d supply, fill #1
  Filled 2021-07-23: qty 30, 30d supply, fill #2

## 2021-05-26 ENCOUNTER — Other Ambulatory Visit (HOSPITAL_BASED_OUTPATIENT_CLINIC_OR_DEPARTMENT_OTHER): Payer: Self-pay

## 2021-05-28 ENCOUNTER — Other Ambulatory Visit (HOSPITAL_BASED_OUTPATIENT_CLINIC_OR_DEPARTMENT_OTHER): Payer: Self-pay

## 2021-05-29 ENCOUNTER — Other Ambulatory Visit: Payer: Self-pay

## 2021-05-29 ENCOUNTER — Ambulatory Visit
Payer: Medicare (Managed Care) | Attending: Family Medicine | Admitting: Rehabilitative and Restorative Service Providers"

## 2021-05-29 ENCOUNTER — Telehealth: Payer: Self-pay | Admitting: Family Medicine

## 2021-05-29 ENCOUNTER — Ambulatory Visit (INDEPENDENT_AMBULATORY_CARE_PROVIDER_SITE_OTHER): Payer: Medicare (Managed Care) | Admitting: Sports Medicine

## 2021-05-29 DIAGNOSIS — H8110 Benign paroxysmal vertigo, unspecified ear: Secondary | ICD-10-CM | POA: Diagnosis not present

## 2021-05-29 DIAGNOSIS — R42 Dizziness and giddiness: Secondary | ICD-10-CM | POA: Diagnosis not present

## 2021-05-29 DIAGNOSIS — M6281 Muscle weakness (generalized): Secondary | ICD-10-CM | POA: Insufficient documentation

## 2021-05-29 DIAGNOSIS — R29818 Other symptoms and signs involving the nervous system: Secondary | ICD-10-CM | POA: Diagnosis present

## 2021-05-29 DIAGNOSIS — R29898 Other symptoms and signs involving the musculoskeletal system: Secondary | ICD-10-CM | POA: Insufficient documentation

## 2021-05-29 DIAGNOSIS — M5442 Lumbago with sciatica, left side: Secondary | ICD-10-CM | POA: Diagnosis present

## 2021-05-29 DIAGNOSIS — G8929 Other chronic pain: Secondary | ICD-10-CM | POA: Diagnosis present

## 2021-05-29 DIAGNOSIS — R2681 Unsteadiness on feet: Secondary | ICD-10-CM | POA: Diagnosis present

## 2021-05-29 DIAGNOSIS — M47812 Spondylosis without myelopathy or radiculopathy, cervical region: Secondary | ICD-10-CM

## 2021-05-29 NOTE — Patient Instructions (Signed)
Access Code: [email or web address] ?URL: [email or web address] ?Date: 05/29/2021 ?Prepared by: Rudell Cobb ? ?Exercises ?Rolling right<>left sides for vestibular habituation - 2 x daily - 7 x weekly - 1 sets - 5 reps ?Seated Gaze Stabilization with Head Rotation - 2 x daily - 7 x weekly - 2 sets - 1 reps - 30 seconds hold ? ?

## 2021-05-29 NOTE — Assessment & Plan Note (Signed)
Robin Conrad returns, she is pleasant 71 year old female with multilevel cervical DDD, multilevel cervical facet arthritis. ?She has adjacent level disease above and below her multilevel cervical fusion. 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 ?Approximately 4 years ago she had a cervical epidural followed by left C2-C3, C3-C4, and C7-T1 facet joint injections, she tells me she did get good relief. ?She got a second opinion with [facility] neurosurgery, no surgical procedures recommended. ?A couple of months ago she was having recurrence of pain, she had a new MRI safe spinal cord stimulator so we proceeded with a cervical spine MRI which led to left C2-C3 and C7-T1 facet joint injections that seemed to work really well. ?She is now having an increase of pain in her neck, right-sided, axial, feels very different from her facet mediated pain in the past which was historically on the left. ?MRI shows spondylitic processes at multiple levels, she does have a right-sided C7-T1 foraminal disc protrusion, I would like to go ahead and set her up with a right C7-T1 epidural. ?Return to see me 4 weeks after the injection to evaluate relief. ?

## 2021-05-29 NOTE — Progress Notes (Signed)
? ? ?  Procedures performed today:   ? ?None. ? ?Independent interpretation of notes and tests performed by another provider:  ? ?I did personally review her cervical spine MRI, she has multilevel spondylosis, I think the dominant finding on the right is a C7-T1 disc protrusion with right-sided C7-T1 foraminal stenosis. ? ?Brief History, Exam, Impression, and Recommendations:   ? ?Cervical spondylosis ?Robin Conrad returns, she is pleasant 71 year old female with multilevel cervical DDD, multilevel cervical facet arthritis. ?She has adjacent level disease above and below her multilevel cervical fusion. 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 ?Approximately 4 years ago she had a cervical epidural followed by left C2-C3, C3-C4, and C7-T1 facet joint injections, she tells me she did get good relief. ?She got a second opinion with [facility] neurosurgery, no surgical procedures recommended. ?A couple of months ago she was having recurrence of pain, she had a new MRI safe spinal cord stimulator so we proceeded with a cervical spine MRI which led to left C2-C3 and C7-T1 facet joint injections that seemed to work really well. ?She is now having an increase of pain in her neck, right-sided, axial, feels very different from her facet mediated pain in the past which was historically on the left. ?MRI shows spondylitic processes at multiple levels, she does have a right-sided C7-T1 foraminal disc protrusion, I would like to go ahead and set her up with a right C7-T1 epidural. ?Return to see me 4 weeks after the injection to evaluate relief. ? ?I spent 30 minutes of total time managing this patient today, this includes chart review, face to face, and non-face to face time. ? ?___________________________________________ ?Gwen Her. Dianah Field, M.D., ABFM., CAQSM. ?Primary Care and Sports Medicine ?[place] ? ?Adjunct Instructor of Family Medicine   ?[employer] ?
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
## 2021-05-29 NOTE — Telephone Encounter (Signed)
Patient dropped off Denial of Medical Coverage paperwork as discussed earlier this morning at visit with Dr. Darene Lamer - Paperwork placed in providers box (Dr. Darene Lamer) - lmr. ?

## 2021-06-01 ENCOUNTER — Encounter: Payer: Self-pay | Admitting: Sports Medicine

## 2021-06-02 ENCOUNTER — Other Ambulatory Visit: Payer: Medicare (Managed Care)

## 2021-06-03 ENCOUNTER — Other Ambulatory Visit: Payer: Self-pay

## 2021-06-03 ENCOUNTER — Ambulatory Visit: Payer: Medicare (Managed Care) | Admitting: Physical Therapy

## 2021-06-03 DIAGNOSIS — R42 Dizziness and giddiness: Secondary | ICD-10-CM

## 2021-06-03 DIAGNOSIS — R2681 Unsteadiness on feet: Secondary | ICD-10-CM
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
## 2021-06-03 NOTE — Patient Instructions (Signed)
Access Code: [email or web address] ?URL: [email or web address] ?Date: 06/03/2021 ?Prepared by: Isabelle Course ? ?Exercises ?Rolling right<>left sides for vestibular habituation - 2 x daily - 7 x weekly - 1 sets - 5 reps ?Seated Gaze Stabilization with Head Rotation - 2 x daily - 7 x weekly - 2 sets - 1 reps - 30 seconds hold ?Heel Raises with Counter Support - 1 x daily - 7 x weekly - 3 sets - 10 reps ?Heel Toe Raises with Counter Support - 1 x daily - 7 x weekly - 3 sets - 10 reps ? ?

## 2021-06-08 ENCOUNTER — Other Ambulatory Visit: Payer: Medicare (Managed Care)

## 2021-06-09 ENCOUNTER — Encounter: Payer: Medicare (Managed Care) | Admitting: Rehabilitative and Restorative Service Providers"

## 2021-06-10 ENCOUNTER — Other Ambulatory Visit: Payer: Self-pay | Admitting: Family Medicine

## 2021-06-10 ENCOUNTER — Other Ambulatory Visit (HOSPITAL_BASED_OUTPATIENT_CLINIC_OR_DEPARTMENT_OTHER): Payer: Self-pay

## 2021-06-11 ENCOUNTER — Other Ambulatory Visit (HOSPITAL_BASED_OUTPATIENT_CLINIC_OR_DEPARTMENT_OTHER): Payer: Self-pay

## 2021-06-11 ENCOUNTER — Ambulatory Visit: Payer: Medicare (Managed Care) | Admitting: Physical Therapy

## 2021-06-11 DIAGNOSIS — G8929 Other chronic pain: Secondary | ICD-10-CM

## 2021-06-11 DIAGNOSIS — M6281 Muscle weakness (generalized): Secondary | ICD-10-CM

## 2021-06-11 DIAGNOSIS — R42 Dizziness and giddiness: Secondary | ICD-10-CM

## 2021-06-11 DIAGNOSIS — R2681 Unsteadiness on feet: Secondary | ICD-10-CM

## 2021-06-11 DIAGNOSIS — R29818 Other symptoms and signs involving the nervous system: Secondary | ICD-10-CM

## 2021-06-11 DIAGNOSIS — R29898 Other symptoms and signs involving the musculoskeletal system: Secondary | ICD-10-CM

## 2021-06-11 MED ORDER — METAXALONE 400 MG PO TABS
800.0000 mg | ORAL_TABLET | Freq: Three times a day (TID) | ORAL | 0 refills | Status: DC | PRN
Start: 2021-06-11 — End: 2021-07-16
  Filled 2021-06-11: qty 60, 10d supply, fill #0
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
## 2021-06-12 ENCOUNTER — Other Ambulatory Visit (HOSPITAL_BASED_OUTPATIENT_CLINIC_OR_DEPARTMENT_OTHER): Payer: Self-pay

## 2021-06-16 ENCOUNTER — Ambulatory Visit
Admission: RE | Admit: 2021-06-16 | Discharge: 2021-06-16 | Disposition: A | Discharge status: Home / Self Care | Payer: Medicare (Managed Care) | Source: Ambulatory Visit | Attending: Sports Medicine | Admitting: Sports Medicine

## 2021-06-16 ENCOUNTER — Other Ambulatory Visit: Payer: Medicare (Managed Care)

## 2021-06-16 ENCOUNTER — Ambulatory Visit: Payer: Medicare (Managed Care) | Admitting: Physical Therapy

## 2021-06-16 DIAGNOSIS — M47812 Spondylosis without myelopathy or radiculopathy, cervical region: Secondary | ICD-10-CM

## 2021-06-16 IMAGING — XA DG INJECT/[PERSON_NAME] INC NEEDLE/CATH/PLC EPI/CERV/THOR W/IMG
2 series · 2 of 2 positions shown · non-contrast
Comparison: none

CLINICAL DATA: Cervical spondylosis without myelopathy. History of
left-sided neck pain which responded well to left-sided cervical
facet injections. More recent onset of right-sided neck and shoulder
pain which is different in nature than the patient's prior facet
mediated pain.

[Series 1: ortho adipose · 1 of 1 slices shown (1 of 2)]
[im 1/1]
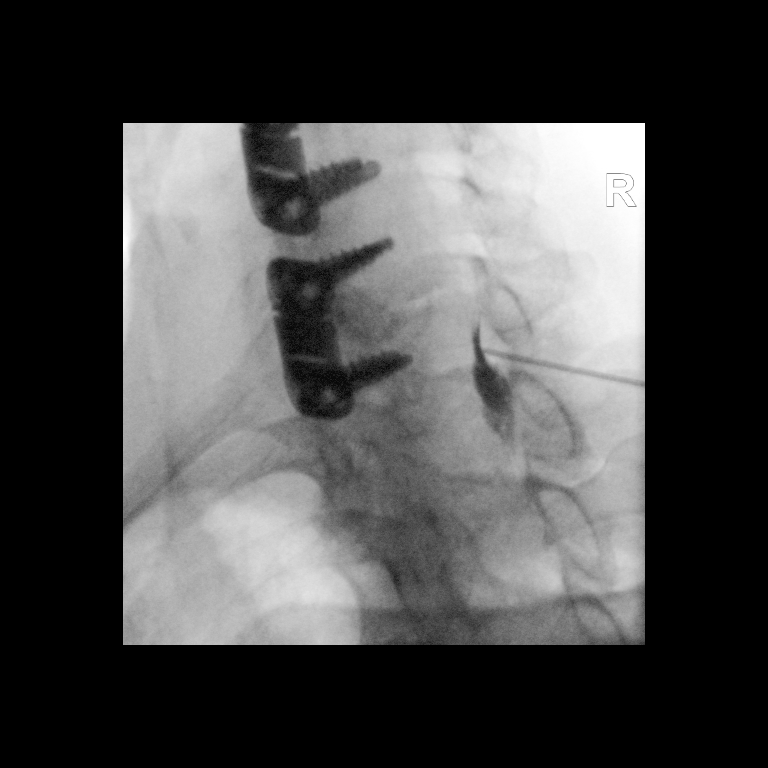

[Series 2: ortho adipose · 1 of 1 slices shown (2 of 2)]
[im 1/1]
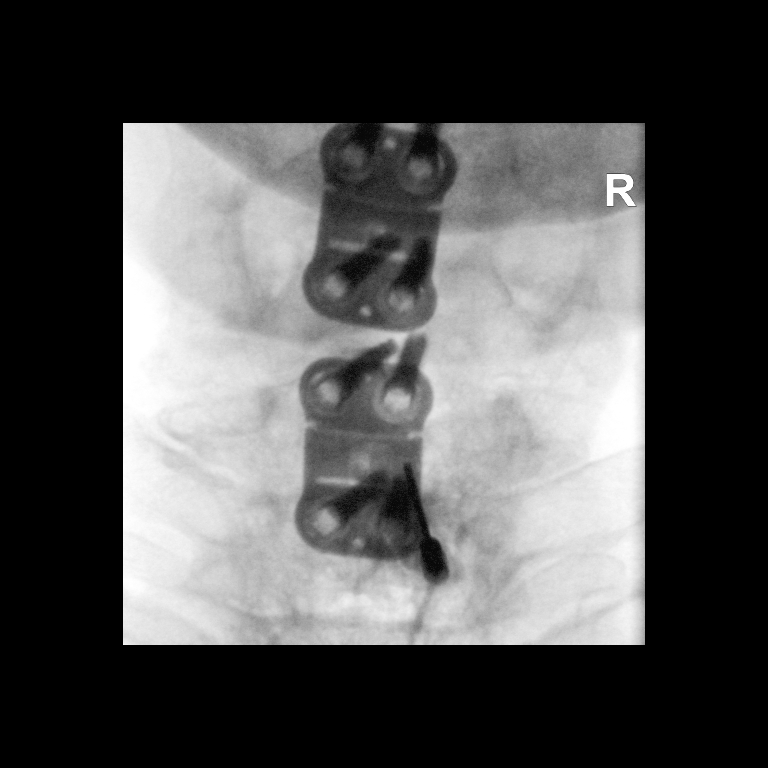

[2 of 2 positions shown; findings below may reference images not displayed]

FLUOROSCOPY:
Radiation Exposure Index (as provided by the fluoroscopic device):
0.90 mGy Kerma

PROCEDURE:
The procedure, risks, benefits, and alternatives were explained to
the patient. Questions regarding the procedure were encouraged and
answered. The patient understands and consents to the procedure.

CERVICAL EPIDURAL INJECTION

An interlaminar approach was performed on the right at C7-T1. A
inch 20 gauge epidural needle was advanced using loss-of-resistance
technique.

DIAGNOSTIC EPIDURAL INJECTION

Injection of Isovue-M 300 shows a good epidural pattern with spread
above and below the level of needle placement, primarily on the
right. No vascular opacification is seen.

THERAPEUTIC EPIDURAL INJECTION

1.5 ml of Kenalog 40 mixed with 2 ml of normal saline were then
instilled. The procedure was well-tolerated, and the patient was
discharged thirty minutes following the injection in good condition.
IMPRESSION: Technically successful interlaminar epidural injection on the right
at C7-T1.

## 2021-06-16 MED ORDER — IOPAMIDOL (ISOVUE-M 300) INJECTION 61%
1.0000 mL | Freq: Once | INTRAMUSCULAR | Status: AC | PRN
  Administered 2021-06-16: 1 mL via EPIDURAL

## 2021-06-16 MED ORDER — TRIAMCINOLONE ACETONIDE 40 MG/ML IJ SUSP (RADIOLOGY)
60.0000 mg | Freq: Once | INTRAMUSCULAR | Status: AC
  Administered 2021-06-16: 60 mg via EPIDURAL

## 2021-06-16 NOTE — Discharge Instructions (Signed)

## 2021-06-17 ENCOUNTER — Ambulatory Visit: Payer: Medicare (Managed Care) | Attending: Family Medicine | Admitting: Physical Therapy

## 2021-06-17 DIAGNOSIS — R42 Dizziness and giddiness: Secondary | ICD-10-CM | POA: Insufficient documentation

## 2021-06-17 DIAGNOSIS — R2681 Unsteadiness on feet: Secondary | ICD-10-CM | POA: Insufficient documentation

## 2021-06-24 ENCOUNTER — Ambulatory Visit: Payer: Medicare (Managed Care) | Admitting: Physical Therapy

## 2021-06-24 DIAGNOSIS — R2681 Unsteadiness on feet: Secondary | ICD-10-CM | POA: Diagnosis present

## 2021-06-24 DIAGNOSIS — R42 Dizziness and giddiness: Secondary | ICD-10-CM

## 2021-06-24 NOTE — Patient Instructions (Signed)
Access Code: [email or web address] ?URL: [email or web address] ?Date: 06/24/2021 ?Prepared by: Isabelle Course ? ?Exercises ?- Rolling rightleft sides for vestibular habituation  - 2 x daily - 7 x weekly - 1 sets - 5 reps ?- Brandt-Daroff Vestibular Exercise  - 2 x daily - 7 x weekly - 1 sets - 3 reps ?- Seated Gaze Stabilization with Head Rotation  - 2 x daily - 7 x weekly - 2 sets - 1 reps - 30 seconds hold ?- Heel Raises with Counter Support  - 1 x daily - 7 x weekly - 3 sets - 10 reps ?- Heel Toe Raises with Counter Support  - 1 x daily - 7 x weekly - 3 sets - 10 reps ?- Standing Quarter Turn with Counter Support  - 1 x daily - 7 x weekly - 1 sets - 10 reps ?- Seated Forward Bending  - 1 x daily - 7 x weekly - 1 sets - 10 reps - 3-5 seconds hold ?
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
## 2021-06-25 ENCOUNTER — Other Ambulatory Visit (HOSPITAL_BASED_OUTPATIENT_CLINIC_OR_DEPARTMENT_OTHER): Payer: Self-pay

## 2021-06-25 ENCOUNTER — Other Ambulatory Visit: Payer: Self-pay | Admitting: Family Medicine

## 2021-06-25 MED ORDER — BUPROPION HCL ER (XL) 150 MG PO TB24
ORAL_TABLET | Freq: Every morning | ORAL | 1 refills | Status: DC
Start: 2021-06-25 — End: 2021-08-19
  Filled 2021-06-25: qty 90, 30d supply, fill #0
  Filled 2021-07-23: qty 90, 30d supply, fill #1

## 2021-06-25 MED ORDER — GABAPENTIN 600 MG PO TABS
ORAL_TABLET | ORAL | 5 refills | Status: DC
  Filled 2021-06-25: qty 135, 29d supply, fill #0
  Filled 2021-07-28: qty 135, 29d supply, fill #1
  Filled 2021-09-14: qty 135, 29d supply, fill #2
  Filled 2021-10-19: qty 135, 29d supply, fill #3
  Filled 2021-12-02: qty 135, 29d supply, fill #4
  Filled 2021-12-31: qty 135, 29d supply, fill #5

## 2021-06-26 ENCOUNTER — Other Ambulatory Visit (HOSPITAL_BASED_OUTPATIENT_CLINIC_OR_DEPARTMENT_OTHER): Payer: Self-pay

## 2021-06-30 DIAGNOSIS — L719 Rosacea, unspecified: Secondary | ICD-10-CM | POA: Diagnosis not present

## 2021-06-30 DIAGNOSIS — D045 Carcinoma in situ of skin of trunk: Secondary | ICD-10-CM | POA: Diagnosis not present

## 2021-07-13 ENCOUNTER — Telehealth: Payer: Self-pay

## 2021-07-13 DIAGNOSIS — M47812 Spondylosis without myelopathy or radiculopathy, cervical region: Secondary | ICD-10-CM

## 2021-07-13 NOTE — Assessment & Plan Note (Signed)
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 ?She has had left-sided C2-C3, C3-C4, and C7-T1 facet joint injections with good relief, she started having right-sided pain, right-sided cervical epidural was ineffective so we will go ahead and set her up for right-sided multilevel facet joint injections.  She has facet arthritis in the upper cervical spine and in the lower cervical spine, so we will try to get her the 4 injections though they may have to be separated by date. ?Return to see me 1 month after the facet joint injections. ?

## 2021-07-13 NOTE — Telephone Encounter (Signed)
Patient aware the facet injections have been ordered and that [place] will be handling the authorization.  ?

## 2021-07-13 NOTE — Telephone Encounter (Signed)
I will go ahead and set her up for multilevel right-sided facet joint injections. ?

## 2021-07-13 NOTE — Telephone Encounter (Signed)
Patient called to report the pain came back and she would like to try the facet injections if her insurance will cover them. Please order.  ?

## 2021-07-16 ENCOUNTER — Ambulatory Visit (INDEPENDENT_AMBULATORY_CARE_PROVIDER_SITE_OTHER): Payer: Medicare (Managed Care) | Admitting: Family Medicine

## 2021-07-16 ENCOUNTER — Other Ambulatory Visit (HOSPITAL_BASED_OUTPATIENT_CLINIC_OR_DEPARTMENT_OTHER): Payer: Self-pay

## 2021-07-16 ENCOUNTER — Encounter: Payer: Self-pay | Admitting: Family Medicine

## 2021-07-16 VITALS — BP 130/69 | HR 84 | Resp 18 | Ht 66.5 in | Wt 125.0 lb

## 2021-07-16 DIAGNOSIS — M5416 Radiculopathy, lumbar region: Secondary | ICD-10-CM

## 2021-07-16 DIAGNOSIS — M797 Fibromyalgia: Secondary | ICD-10-CM

## 2021-07-16 DIAGNOSIS — M810 Age-related osteoporosis without current pathological fracture: Secondary | ICD-10-CM | POA: Diagnosis not present

## 2021-07-16 DIAGNOSIS — M7541 Impingement syndrome of right shoulder: Secondary | ICD-10-CM | POA: Diagnosis not present

## 2021-07-16 MED ORDER — TRAMADOL HCL 50 MG PO TABS
100.0000 mg | ORAL_TABLET | Freq: Three times a day (TID) | ORAL | 2 refills | Status: DC | PRN
  Filled 2021-07-16: qty 60, 10d supply, fill #0
  Filled 2021-08-20: qty 60, 10d supply, fill #1
  Filled 2021-09-14: qty 60, 10d supply, fill #2

## 2021-07-16 MED ORDER — METHOCARBAMOL 500 MG PO TABS
500.0000 mg | ORAL_TABLET | Freq: Every evening | ORAL | 0 refills | Status: DC | PRN
  Filled 2021-07-16: qty 90, 90d supply, fill #0

## 2021-07-16 NOTE — Assessment & Plan Note (Addendum)
Due for next Prolia is in July.  Okay to hold her calcium and vitamin D for week or 2 while she is out of the country and then restart when she returns. ?

## 2021-07-16 NOTE — Assessment & Plan Note (Signed)
Oertli on meloxicam and tramadol.  Does need a refill on the tramadol.  She does take the methocarbamol at night but has to take a half a tab otherwise it makes her feel dizzy during the day. ?

## 2021-07-16 NOTE — Progress Notes (Signed)
? ?Established Patient Office Visit ? ?Subjective   ?Patient ID: Robin Conrad, female    DOB: [birth year]  Age: 71 y.o. MRN: [id number] ? ?Chief Complaint  ?Patient presents with  ? Follow-up  ? ? ?HPI ?F/U fibromyalgia -overall she is doing well.  She recently had an epidural in her neck.  She just got a little minimal relief but not significant so she is trying to get set up for facet injection to see if that would be helpful but waiting for insurance to approve it.  Would like to see if we can go back to the methocarbamol.  She says it sometimes would make her feel dizzy during the middle the day but if she took a half a tab at night it usually would work well.  We initially had switched to Skelaxin because her insurance was not covering it but she found out recently that they will cover it and would like [place] to try to send it in again. ? ?She is actually traveling to [place] in a few weeks and says it is exciting but at the same time she is a little stressed.  Her travel agent is out sick and so she has not been able to contact her and get some of her questions answered.  She had some questions about traveling with her medications. ? ?She is still having a lot of soreness in the right side of her neck. ? ? ? ? ?ROS ? ?  ?Objective:  ?  ? ?BP 130/69   Pulse 84   Resp 18   Ht 5' 6.5" (1.689 m)   Wt 125 lb (56.7 kg)   SpO2 98%   BMI 19.87 kg/m?  ? ? ?Physical Exam ?Vitals and nursing note reviewed.  ?Constitutional:   ?   Appearance: She is well-developed.  ?HENT:  ?   Head: Normocephalic and atraumatic.  ?Cardiovascular:  ?   Rate and Rhythm: Normal rate and regular rhythm.  ?   Heart sounds: Normal heart sounds.  ?Pulmonary:  ?   Effort: Pulmonary effort is normal.  ?   Breath sounds: Normal breath sounds.  ?Skin: ?   General: Skin is warm and dry.  ?Neurological:  ?   Mental Status: She is alert and oriented to person, place, and time.  ?Psychiatric:     ?   Behavior: Behavior normal.  ? ? ? ?No  results found for any visits on 07/16/21. ? ? ? ?The 10-year ASCVD risk score (Arnett DK, et al., 2019) is: 11.1% ? ?  ?Assessment & Plan:  ? ?Problem List Items Addressed This Visit   ? ?  ? Nervous and Auditory  ? Left lumbar radiculitis  ? Relevant Medications  ? traMADol (ULTRAM) 50 MG tablet  ? methocarbamol (ROBAXIN) 500 MG tablet  ?  ? Musculoskeletal and Integument  ? Impingement syndrome, shoulder, right  ? Relevant Medications  ? traMADol (ULTRAM) 50 MG tablet  ? methocarbamol (ROBAXIN) 500 MG tablet  ? Age-related osteoporosis without current pathological fracture  ?  Due for next Prolia is in July.  Okay to hold her calcium and vitamin D for week or 2 while she is out of the country and then restart when she returns. ? ?  ?  ?  ? Other  ? Fibromyalgia - Primary  ?  Oertli on meloxicam and tramadol.  Does need a refill on the tramadol.  She does take the methocarbamol at night but has to take a half  a tab otherwise it makes her feel dizzy during the day. ? ?  ?  ? Relevant Medications  ? traMADol (ULTRAM) 50 MG tablet  ? methocarbamol (ROBAXIN) 500 MG tablet  ? ? ?Return in about 3 months (around 10/16/2021) for Fibro and medications.  ? ? ?Beatrice Lecher, MD ? ?

## 2021-07-23 ENCOUNTER — Other Ambulatory Visit (HOSPITAL_BASED_OUTPATIENT_CLINIC_OR_DEPARTMENT_OTHER): Payer: Self-pay

## 2021-07-27 DIAGNOSIS — M797 Fibromyalgia: Secondary | ICD-10-CM | POA: Diagnosis not present

## 2021-07-27 DIAGNOSIS — Z791 Long term (current) use of non-steroidal anti-inflammatories (NSAID): Secondary | ICD-10-CM | POA: Diagnosis not present

## 2021-07-27 DIAGNOSIS — M351 Other overlap syndromes: Secondary | ICD-10-CM | POA: Diagnosis not present

## 2021-07-28 ENCOUNTER — Other Ambulatory Visit (HOSPITAL_BASED_OUTPATIENT_CLINIC_OR_DEPARTMENT_OTHER): Payer: Self-pay

## 2021-07-28 MED ORDER — ALBUTEROL SULFATE HFA 108 (90 BASE) MCG/ACT IN AERS
INHALATION_SPRAY | RESPIRATORY_TRACT | 3 refills | Status: AC
Start: 2021-07-28 — End: 2024-11-21
  Filled 2021-07-28: qty 6.7, 25d supply, fill #0
  Filled 2021-09-14: qty 6.7, 25d supply, fill #1

## 2021-07-30 NOTE — Discharge Instructions (Signed)

## 2021-07-31 ENCOUNTER — Other Ambulatory Visit: Payer: Self-pay | Admitting: Sports Medicine

## 2021-07-31 ENCOUNTER — Ambulatory Visit
Admission: RE | Admit: 2021-07-31 | Discharge: 2021-07-31 | Disposition: A | Discharge status: Home / Self Care | Payer: Medicare (Managed Care) | Source: Ambulatory Visit | Attending: Sports Medicine | Admitting: Sports Medicine

## 2021-07-31 DIAGNOSIS — M47812 Spondylosis without myelopathy or radiculopathy, cervical region: Secondary | ICD-10-CM

## 2021-07-31 IMAGING — XA DG FACET JT INJ C/T 2ND LEVEL*R* W/FL/CT
4 series · 4 of 4 positions shown · non-contrast
Comparison: none

CLINICAL DATA: Cervical spondylosis without myelopathy. Cervical
facet arthropathy. Right-sided neck pain. The patient reports only a
mild improvement for 2 weeks following the cervical epidural
injection last month. Right-sided C2-3, C3-4, C6-7, and C7-T1 facet
injections were requested today. C3-4 was not injected as the facets
appear ankylosed at this level.

[Series 1: ortho standard · 1 of 1 slices shown (1 of 4)]
[im 1/1]
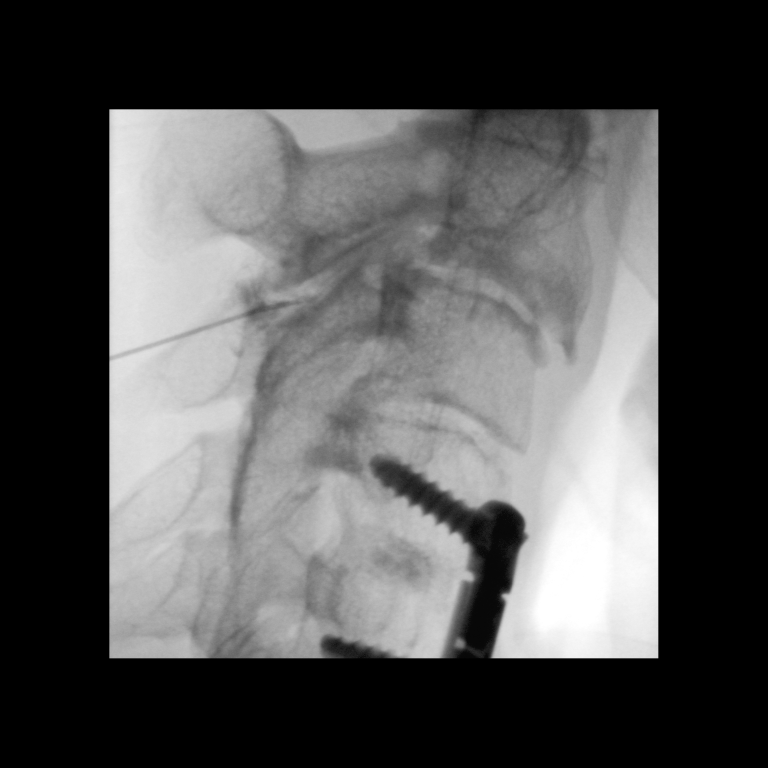

[Series 2: ortho standard · 1 of 1 slices shown (2 of 4)]
[im 1/1]
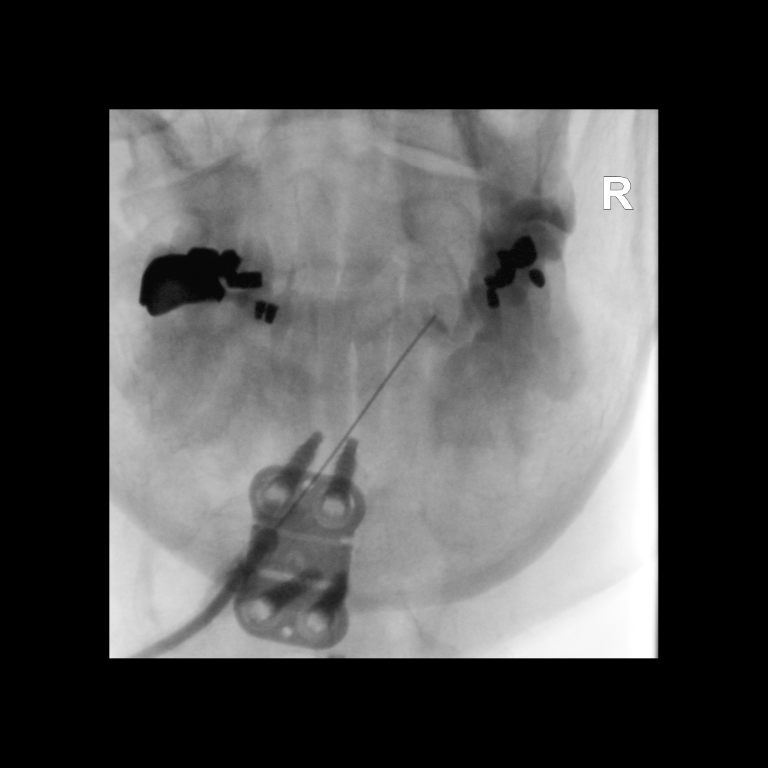

[Series 3: ortho standard · 1 of 1 slices shown (3 of 4)]
[im 1/1]
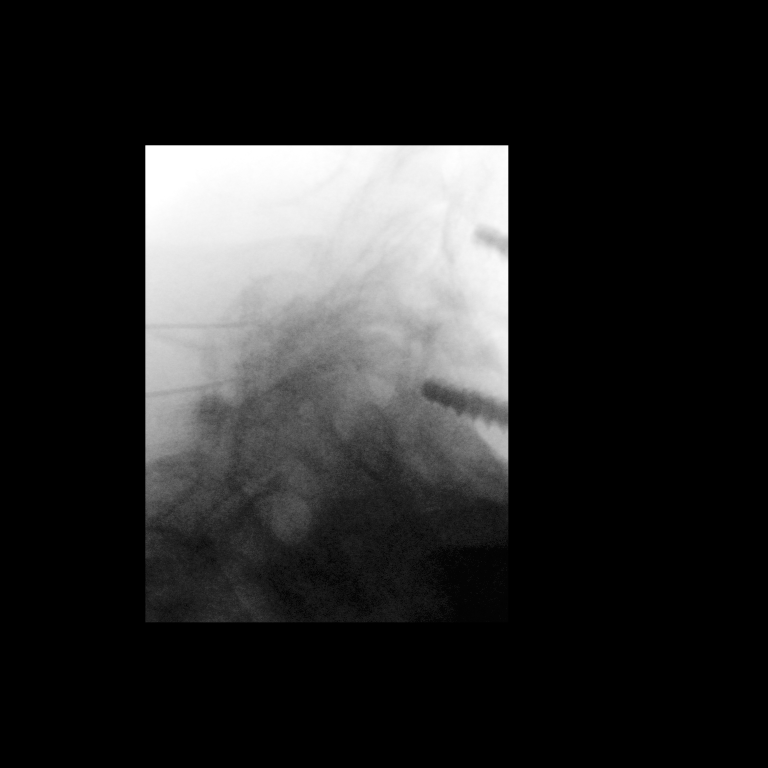

[Series 4: ortho standard · 1 of 1 slices shown (4 of 4)]
[im 1/1]
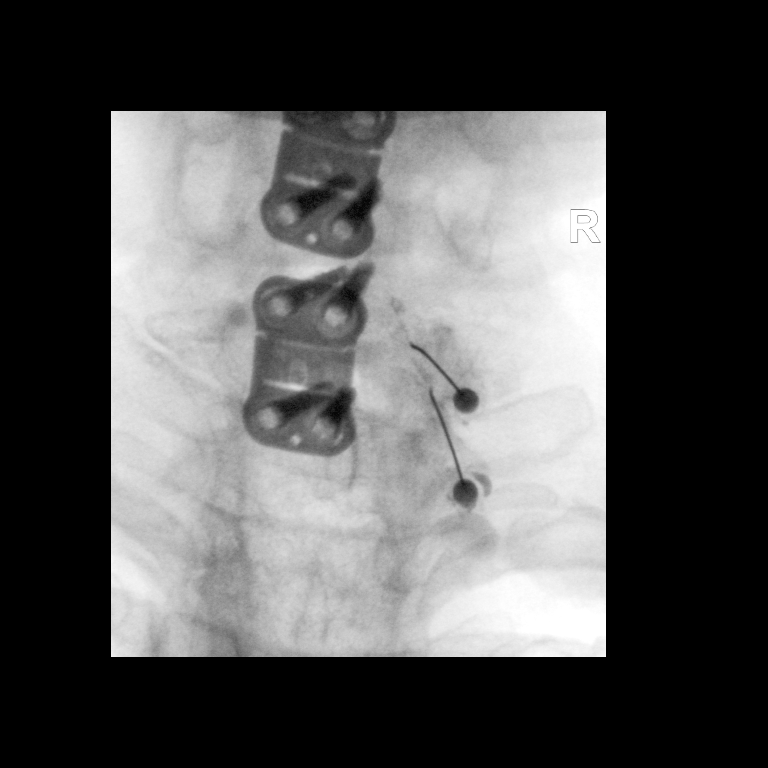

[4 of 4 positions shown; findings below may reference images not displayed]

FLUOROSCOPY TIME:  Fluoroscopy Time: 1 minute 25 seconds

Radiation Exposure Index: 9.10 mGy

PROCEDURE:
The procedure, risks, benefits, and alternatives were explained to
the patient. Questions regarding the procedure were encouraged and
answered. The patient understands and consents to the procedure.

RIGHT C2-3, C6-7, AND C7-T1 FACET INJECTIONS: A posterior oblique
approach was taken to the facets on the right at C2-3, C6-7, and
C7-T1 using curved 3.5 inch 25 gauge spinal needles. Injection of a
small amount of Isovue-M 300 demonstrated mixed intra-articular and
periarticular spread at C2-3 and predominantly periarticular spread
at C6-7 and C7-T1. No vascular opacification was seen. 4 mg of
dexamethasone and a few drops of 1% lidocaine were instilled at each
joint. The procedure was well-tolerated.
IMPRESSION: Technically successful right C2-3, C6-7, and C7-T1 facet injections.

## 2021-07-31 IMAGING — XA DG FACET JT INJ C/T 3RD PLUS LEVEL*R* W/FL/CT
4 series · 4 of 4 positions shown · non-contrast
Comparison: none

CLINICAL DATA: Cervical spondylosis without myelopathy. Cervical
facet arthropathy. Right-sided neck pain. The patient reports only a
mild improvement for 2 weeks following the cervical epidural
injection last month. Right-sided C2-3, C3-4, C6-7, and C7-T1 facet
injections were requested today. C3-4 was not injected as the facets
appear ankylosed at this level.

[Series 1: ortho standard · 1 of 1 slices shown (1 of 4)]
[im 1/1]
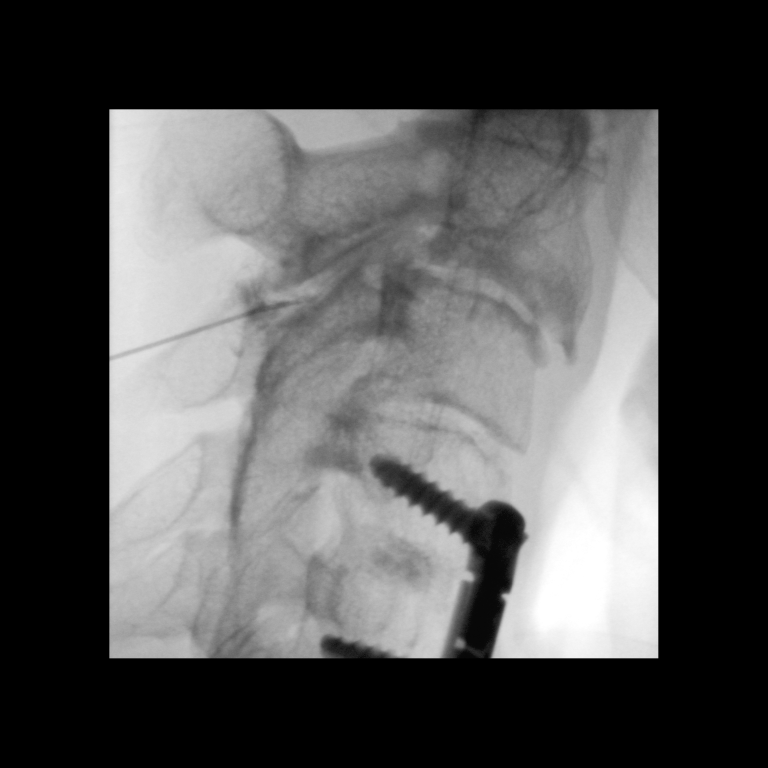

[Series 2: ortho standard · 1 of 1 slices shown (2 of 4)]
[im 1/1]
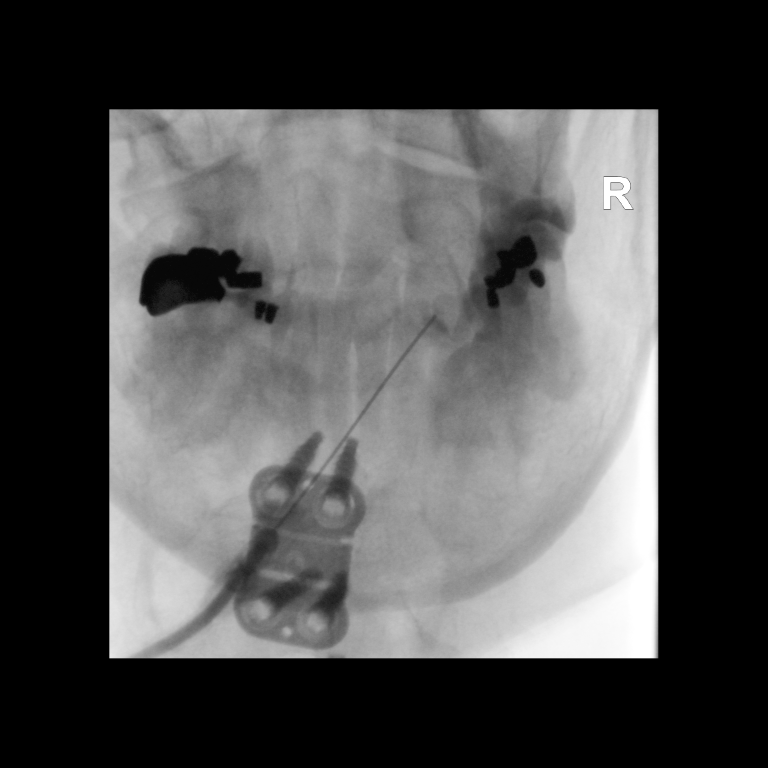

[Series 3: ortho standard · 1 of 1 slices shown (3 of 4)]
[im 1/1]
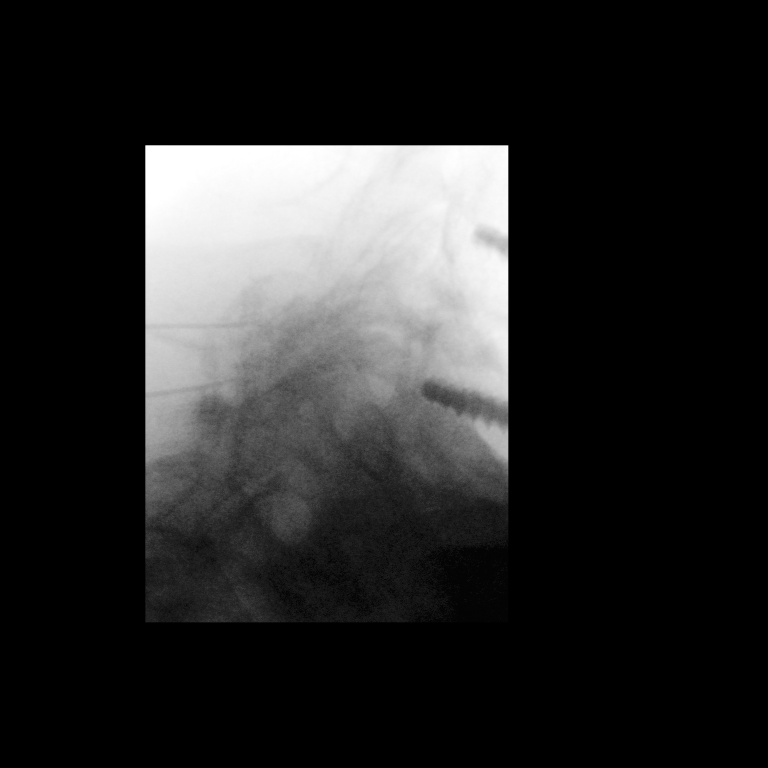

[Series 4: ortho standard · 1 of 1 slices shown (4 of 4)]
[im 1/1]
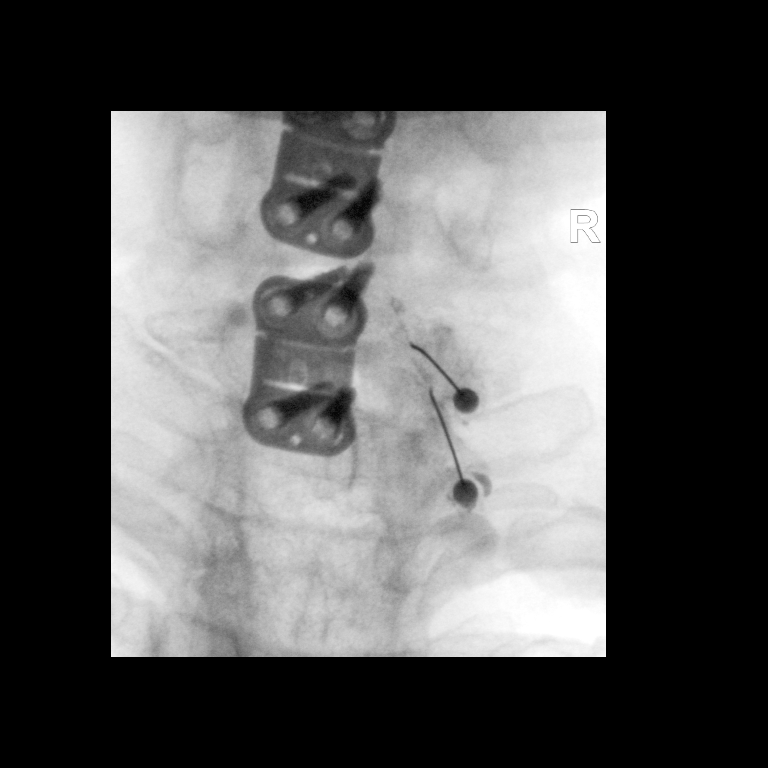

[4 of 4 positions shown; findings below may reference images not displayed]

FLUOROSCOPY TIME:  Fluoroscopy Time: 1 minute 25 seconds

Radiation Exposure Index: 9.10 mGy

PROCEDURE:
The procedure, risks, benefits, and alternatives were explained to
the patient. Questions regarding the procedure were encouraged and
answered. The patient understands and consents to the procedure.

RIGHT C2-3, C6-7, AND C7-T1 FACET INJECTIONS: A posterior oblique
approach was taken to the facets on the right at C2-3, C6-7, and
C7-T1 using curved 3.5 inch 25 gauge spinal needles. Injection of a
small amount of Isovue-M 300 demonstrated mixed intra-articular and
periarticular spread at C2-3 and predominantly periarticular spread
at C6-7 and C7-T1. No vascular opacification was seen. 4 mg of
dexamethasone and a few drops of 1% lidocaine were instilled at each
joint. The procedure was well-tolerated.
IMPRESSION: Technically successful right C2-3, C6-7, and C7-T1 facet injections.

## 2021-07-31 IMAGING — XA DG FACET JT INJ C&T SPINE SINGLE LEVEL *R*
4 series · 4 of 4 positions shown · non-contrast
Comparison: none

CLINICAL DATA: Cervical spondylosis without myelopathy. Cervical
facet arthropathy. Right-sided neck pain. The patient reports only a
mild improvement for 2 weeks following the cervical epidural
injection last month. Right-sided C2-3, C3-4, C6-7, and C7-T1 facet
injections were requested today. C3-4 was not injected as the facets
appear ankylosed at this level.

[Series 1: ortho standard · 1 of 1 slices shown (1 of 4)]
[im 1/1]
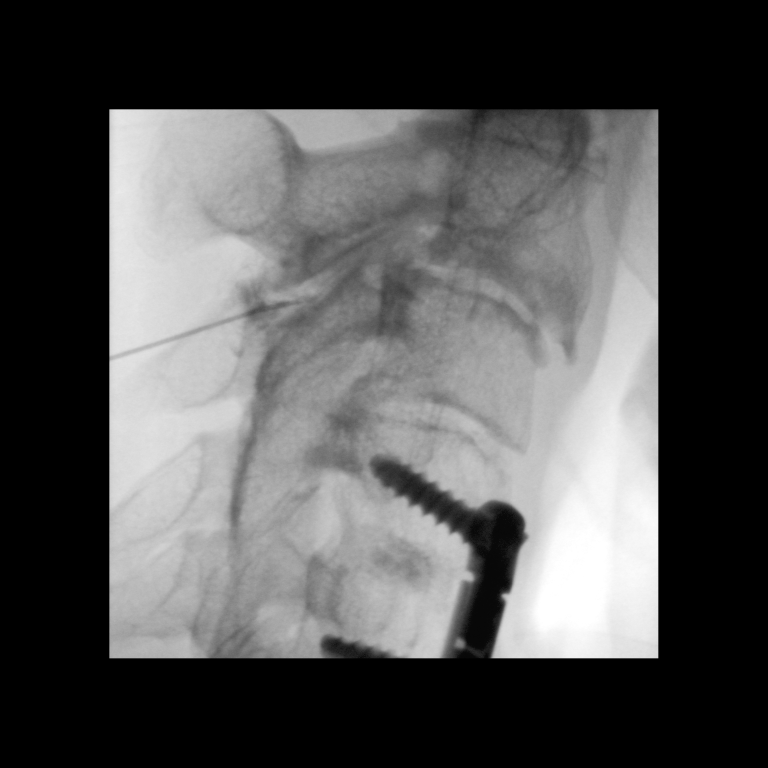

[Series 2: ortho standard · 1 of 1 slices shown (2 of 4)]
[im 1/1]
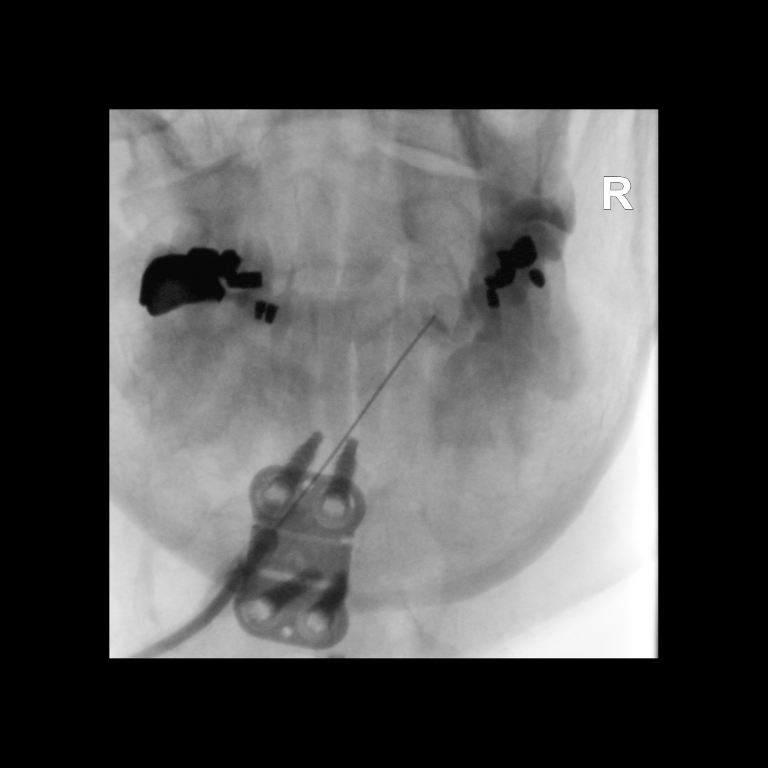

[Series 3: ortho standard · 1 of 1 slices shown (3 of 4)]
[im 1/1]
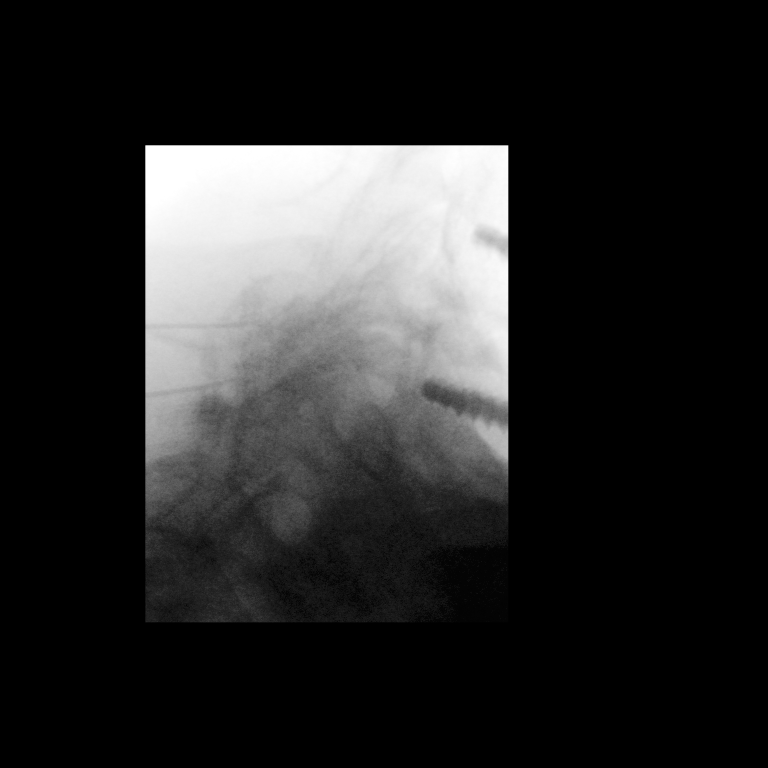

[Series 4: ortho standard · 1 of 1 slices shown (4 of 4)]
[im 1/1]
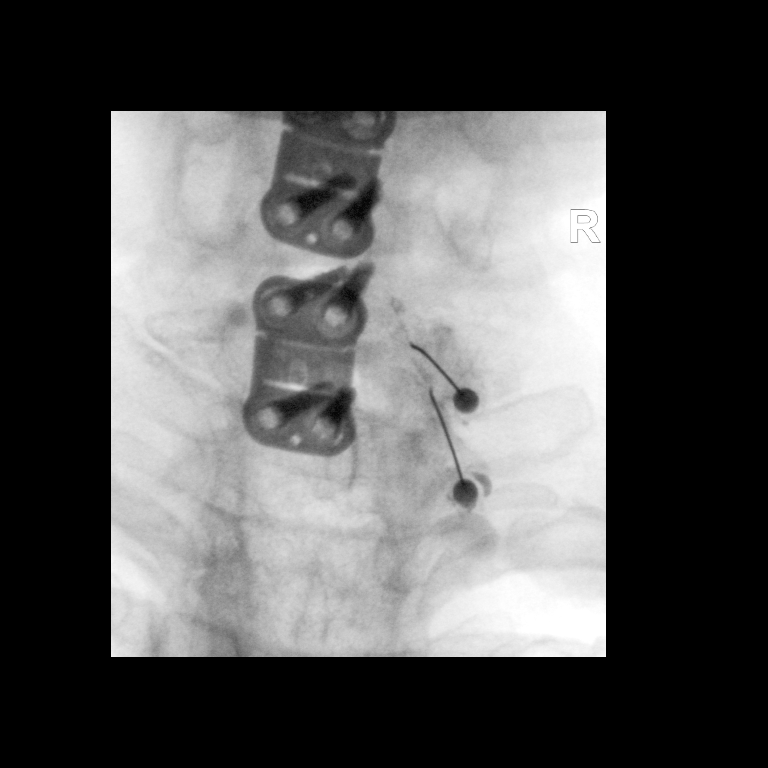

[4 of 4 positions shown; findings below may reference images not displayed]

FLUOROSCOPY TIME:  Fluoroscopy Time: 1 minute 25 seconds

Radiation Exposure Index: 9.10 mGy

PROCEDURE:
The procedure, risks, benefits, and alternatives were explained to
the patient. Questions regarding the procedure were encouraged and
answered. The patient understands and consents to the procedure.

RIGHT C2-3, C6-7, AND C7-T1 FACET INJECTIONS: A posterior oblique
approach was taken to the facets on the right at C2-3, C6-7, and
C7-T1 using curved 3.5 inch 25 gauge spinal needles. Injection of a
small amount of Isovue-M 300 demonstrated mixed intra-articular and
periarticular spread at C2-3 and predominantly periarticular spread
at C6-7 and C7-T1. No vascular opacification was seen. 4 mg of
dexamethasone and a few drops of 1% lidocaine were instilled at each
joint. The procedure was well-tolerated.
IMPRESSION: Technically successful right C2-3, C6-7, and C7-T1 facet injections.

## 2021-07-31 MED ORDER — IOPAMIDOL (ISOVUE-M 300) INJECTION 61%
1.0000 mL | Freq: Once | INTRAMUSCULAR | Status: AC | PRN
  Administered 2021-07-31: 1 mL via INTRA_ARTICULAR

## 2021-07-31 MED ORDER — DEXAMETHASONE SODIUM PHOSPHATE 4 MG/ML IJ SOLN
12.0000 mg | Freq: Once | INTRAMUSCULAR | Status: AC
  Administered 2021-07-31: 12 mg via INTRA_ARTICULAR

## 2021-08-17 ENCOUNTER — Telehealth: Payer: Self-pay | Admitting: *Deleted

## 2021-08-17 NOTE — Telephone Encounter (Signed)
Pt called and stated that she has returned from [place]  (Saturday 08/15/2021) and while there she experienced some falls, UTI, tested Positive for COVID (last week). She still has a cough and still feels weak.   She is really sore in her neck, shoulders, elbows. She questions if something else is going on due to all of the falls she has been experiencing.     Pt was advised that she would need to retest and be asymptomatic before coming into the office. Advised to call back once she has retested and if she is still positive we could do a virtual visit for her .   Will fwd to pcp for any further instructions.

## 2021-08-17 NOTE — Telephone Encounter (Signed)
Ok to schedule in an acute at end of day today

## 2021-08-18 NOTE — Telephone Encounter (Signed)
Called pt to let her know that we will call to get her scheduled for a my chart visit. Will call back with the details.

## 2021-08-19 ENCOUNTER — Encounter: Payer: Self-pay | Admitting: Family Medicine

## 2021-08-19 ENCOUNTER — Telehealth (INDEPENDENT_AMBULATORY_CARE_PROVIDER_SITE_OTHER): Payer: Medicare (Managed Care) | Admitting: Family Medicine

## 2021-08-19 ENCOUNTER — Other Ambulatory Visit (HOSPITAL_BASED_OUTPATIENT_CLINIC_OR_DEPARTMENT_OTHER): Payer: Self-pay

## 2021-08-19 DIAGNOSIS — U071 COVID-19: Secondary | ICD-10-CM | POA: Diagnosis not present

## 2021-08-19 DIAGNOSIS — M25473 Effusion, unspecified ankle: Secondary | ICD-10-CM | POA: Diagnosis not present

## 2021-08-19 DIAGNOSIS — J069 Acute upper respiratory infection, unspecified: Secondary | ICD-10-CM

## 2021-08-19 DIAGNOSIS — R42 Dizziness and giddiness: Secondary | ICD-10-CM

## 2021-08-19 MED ORDER — BUPROPION HCL ER (XL) 150 MG PO TB24
ORAL_TABLET | Freq: Every morning | ORAL | 1 refills | Status: DC
Start: 2021-08-19 — End: 2021-10-22
  Filled 2021-08-19: qty 90, 30d supply, fill #0
  Filled 2021-09-14: qty 90, 30d supply, fill #1

## 2021-08-19 MED ORDER — MELOXICAM 15 MG PO TABS
ORAL_TABLET | Freq: Every day | ORAL | 1 refills | Status: DC
  Filled 2021-08-19: qty 90, 90d supply, fill #0
  Filled 2021-11-24: qty 90, 90d supply, fill #1

## 2021-08-19 NOTE — Telephone Encounter (Signed)
Pt seen today.  Charyl Bigger, CMA

## 2021-08-19 NOTE — Progress Notes (Signed)
Virtual Visit via Video Note  I connected with Robin Conrad on 08/19/21 at 10:10 AM EDT by a video enabled telemedicine application and verified that I am speaking with the correct person using two identifiers.   I discussed the limitations of evaluation and management by telemedicine and the availability of in person appointments. The patient expressed understanding and agreed to proceed.  Patient location: at home Provider location: in office  Subjective:    CC:   Chief Complaint  Patient presents with   Covid Positive    HPI:  Cough, nasal congestion, tired. For the last couple of weeks. She believes that she may have gotten COVID while on her trip. Tested positive whiles in [place]. Felt like she may have had a UTI as well.    Pt reports that she has been experiencing multiple falls. Unsure if it was due from dehydration, or from vertigo.   Felt like she was dehydrated and [place]. She felt like she was going to pass out.  She was able to get water at the visitor's center.   Later dock moved while she was trying to walk on it and her left ankle was injured on the outside.  Then fell again and had some cuts on her knees as well. She stated that her ankles are really swollen and bruised unsure if she may have a sprain. Then fell again getting out of the tub/shower.    Past medical history, Surgical history, Family history not pertinant except as noted below, Social history, Allergies, and medications have been entered into the medical record, reviewed, and corrections made.    Objective:    General: Speaking clearly in complete sentences without any shortness of breath.  Alert and oriented x3.  Normal judgment. No apparent acute distress.    Impression and Recommendations:    Problem List Items Addressed This Visit   None Visit Diagnoses     Lightheadedness    -  Primary   Viral upper respiratory tract  infection       Ankle swelling, unspecified laterality       COVID-19          Lightheadedness - she is feeling some better. She does have a BP cuff at home and encouraged her to check her BP.  Encouraged her to stay hydrated.  COVID 19 - she is tested negative this morning and she has already had 10 days of symptoms.  She is feeling some better though still having some nasal congestion.  We discussed that that should hopefully improve by the weekend but if it does not please let us know.  Bilateral ankle pain -possible injury/strain.  I am to have her come in on Friday so that we can take a look in the meantime recommend elevation, icing, rest and compression.  No orders of the defined types were placed in this encounter.   Meds ordered this encounter  Medications   meloxicam (MOBIC) 15 MG tablet    Sig: TAKE 1 TABLET BY MOUTH ONCE DAILY    Dispense:  90 tablet    Refill:  1   buPROPion (WELLBUTRIN XL) 150 MG 24 hr tablet    Sig: TAKE 3 TABLETS BY MOUTH EVERY MORNING    Dispense:  90 tablet    Refill:  1     I discussed the assessment and treatment plan with the patient. The patient was provided an opportunity to ask questions  and all were answered. The patient agreed with the plan and demonstrated an understanding of the instructions.   The patient was advised to call back or seek an in-person evaluation if the symptoms worsen or if the condition fails to improve as anticipated.  I spent 25 minutes on the day of the encounter to include pre-visit record review, face-to-face time with the patient and post visit ordering of test.   Beatrice Lecher, MD

## 2021-08-19 NOTE — Progress Notes (Signed)
Cough, nasal congestion, tired. She believes that she may have gotten COVID while on her trip   Pt reports that she has been experiencing multiple falls. Unsure if it was due from dehydration, or from vertigo.  She stated that her ankles are really swollen and bruised unsure if she may have a sprain.

## 2021-08-20 ENCOUNTER — Other Ambulatory Visit (HOSPITAL_BASED_OUTPATIENT_CLINIC_OR_DEPARTMENT_OTHER): Payer: Self-pay

## 2021-08-21 ENCOUNTER — Ambulatory Visit (INDEPENDENT_AMBULATORY_CARE_PROVIDER_SITE_OTHER): Payer: Medicare (Managed Care) | Admitting: Family Medicine

## 2021-08-21 ENCOUNTER — Ambulatory Visit (INDEPENDENT_AMBULATORY_CARE_PROVIDER_SITE_OTHER): Payer: Medicare (Managed Care)

## 2021-08-21 ENCOUNTER — Other Ambulatory Visit (HOSPITAL_BASED_OUTPATIENT_CLINIC_OR_DEPARTMENT_OTHER): Payer: Self-pay

## 2021-08-21 VITALS — BP 105/58 | HR 86 | Ht 65.5 in

## 2021-08-21 DIAGNOSIS — M25572 Pain in left ankle and joints of left foot: Secondary | ICD-10-CM | POA: Diagnosis not present

## 2021-08-21 DIAGNOSIS — R296 Repeated falls: Secondary | ICD-10-CM | POA: Diagnosis not present

## 2021-08-21 DIAGNOSIS — M25571 Pain in right ankle and joints of right foot: Secondary | ICD-10-CM | POA: Diagnosis not present

## 2021-08-21 DIAGNOSIS — M79672 Pain in left foot: Secondary | ICD-10-CM | POA: Diagnosis not present

## 2021-08-21 IMAGING — DX DG ANKLE COMPLETE 3+V*L*
3 series · 3 of 3 positions shown · non-contrast
Comparison: None Available.

CLINICAL DATA: Fall multiple weeks ago with pain.

EXAM:
LEFT FOOT - COMPLETE 3+ VIEW; LEFT ANKLE COMPLETE - 3+ VIEW

[ankle ap]
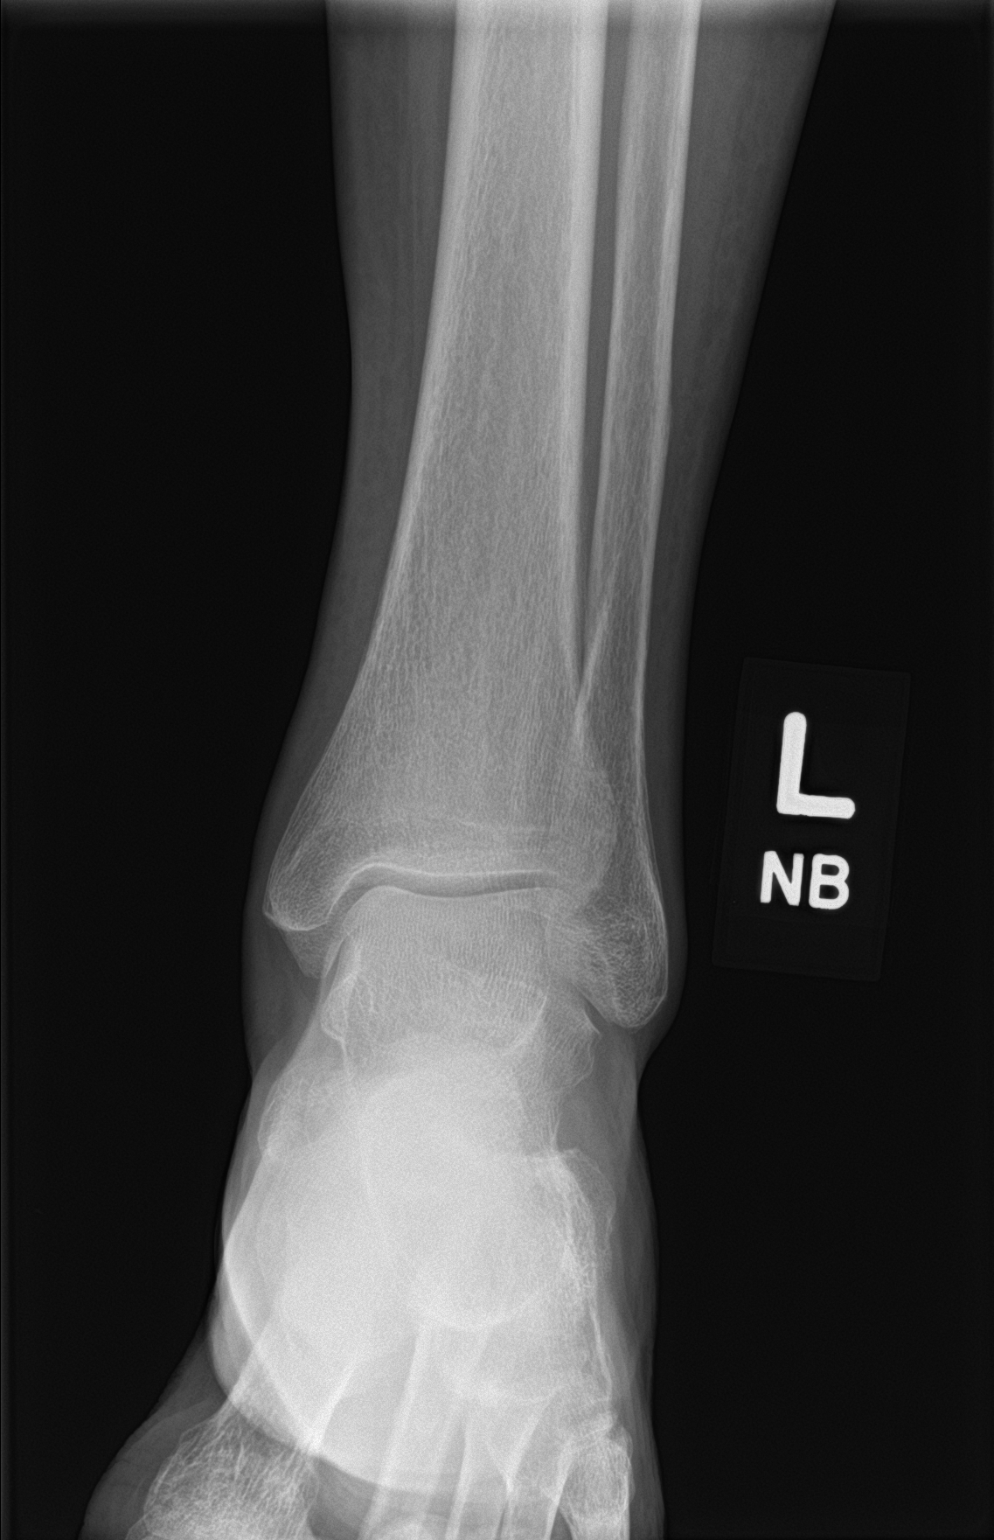

[ankle obl]
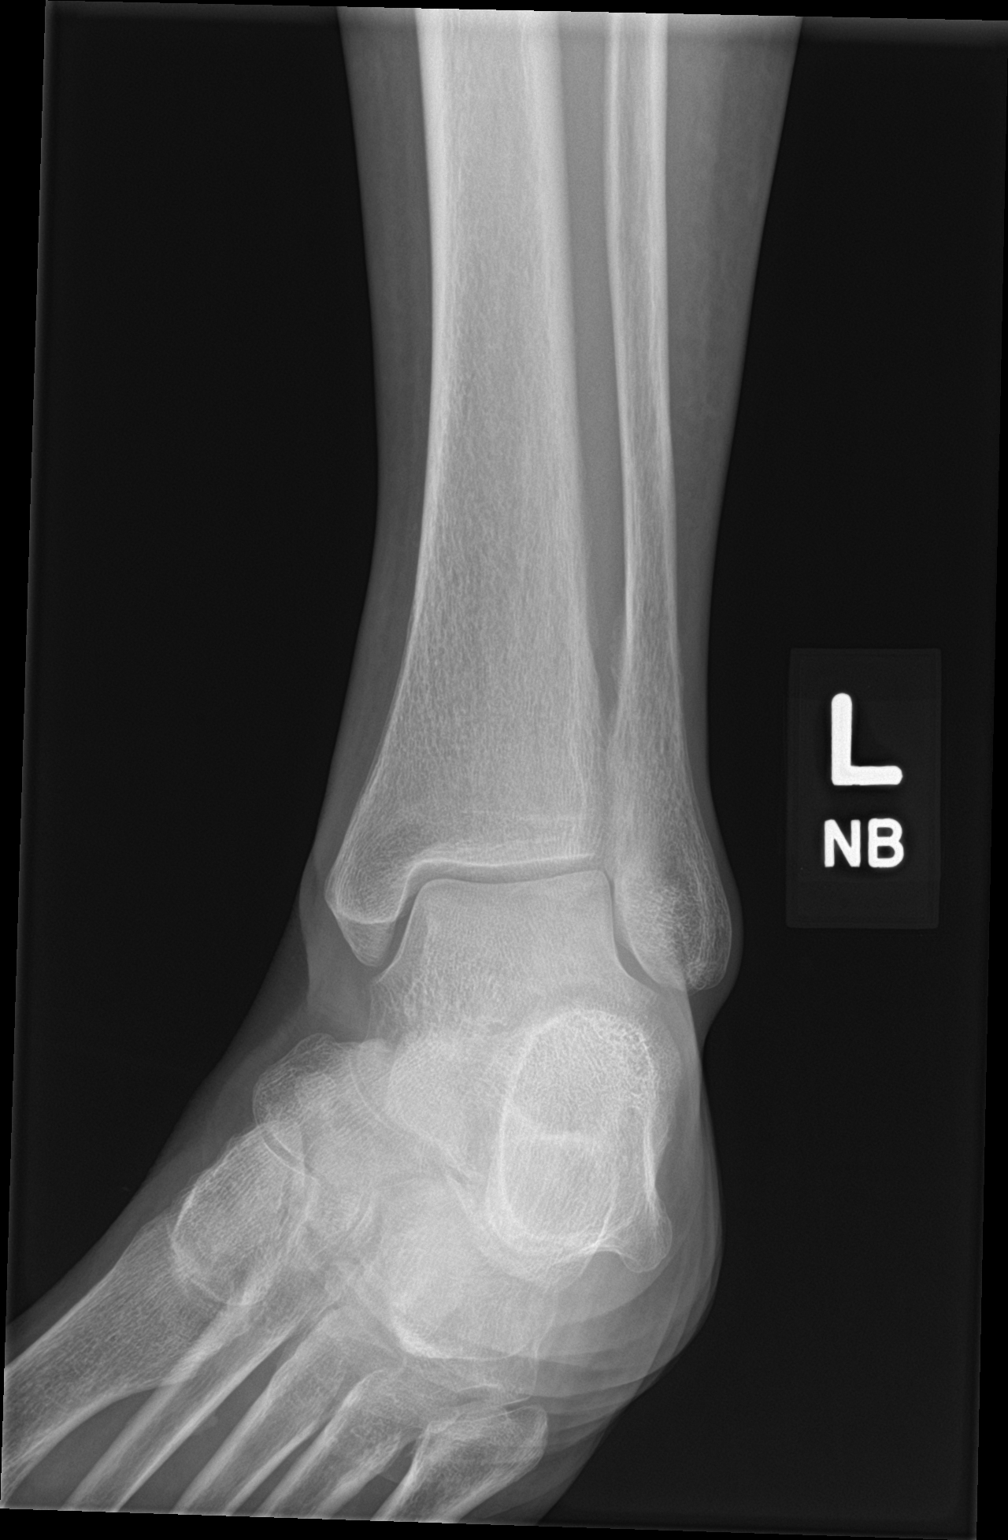

[ankle lat]
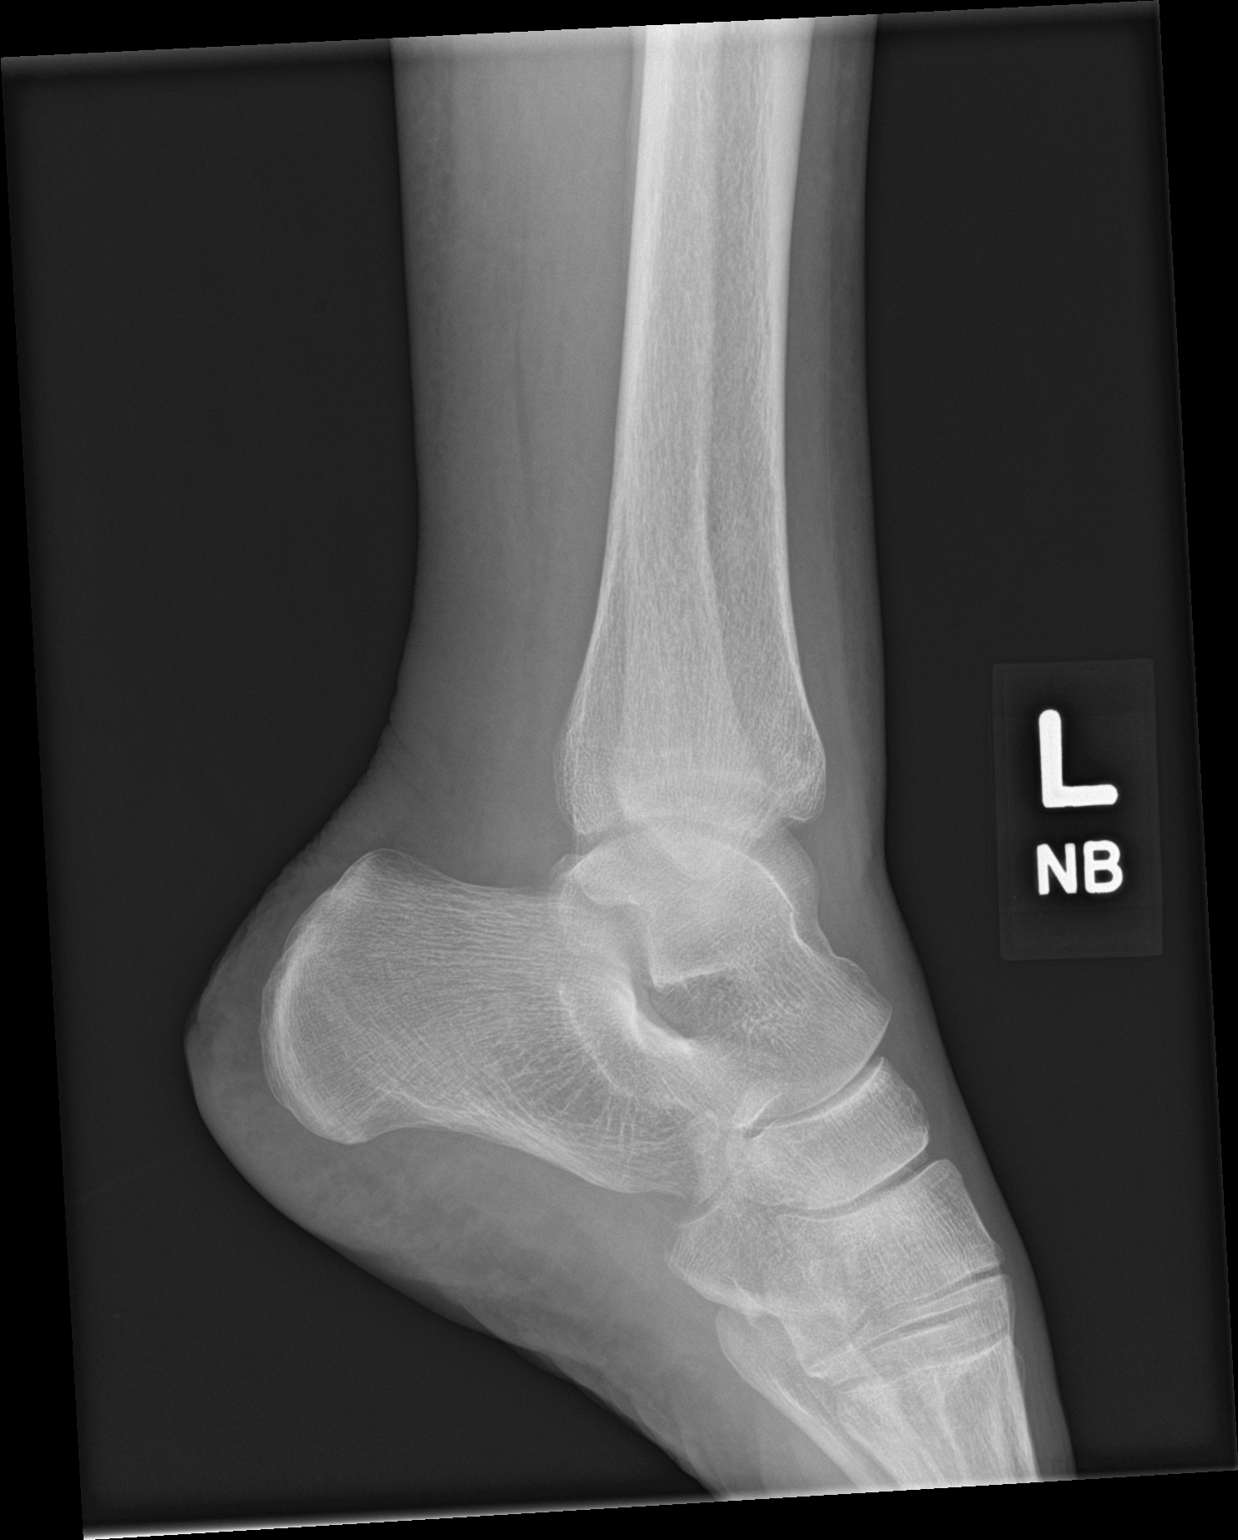

[3 of 3 positions shown; findings below may reference images not displayed]

FINDINGS: There is no evidence of fracture or dislocation in the ankle or
foot. Degenerative changes are present at the first
metatarsophalangeal joint. Soft tissues are unremarkable.
IMPRESSION: No acute fracture or dislocation.

## 2021-08-21 IMAGING — DX DG ANKLE COMPLETE 3+V*R*
3 series · 3 of 3 positions shown · non-contrast
Comparison: None Available.

CLINICAL DATA: Medial ankle pain, recent falls

EXAM:
RIGHT ANKLE - COMPLETE 3+ VIEW

[ankle ap]
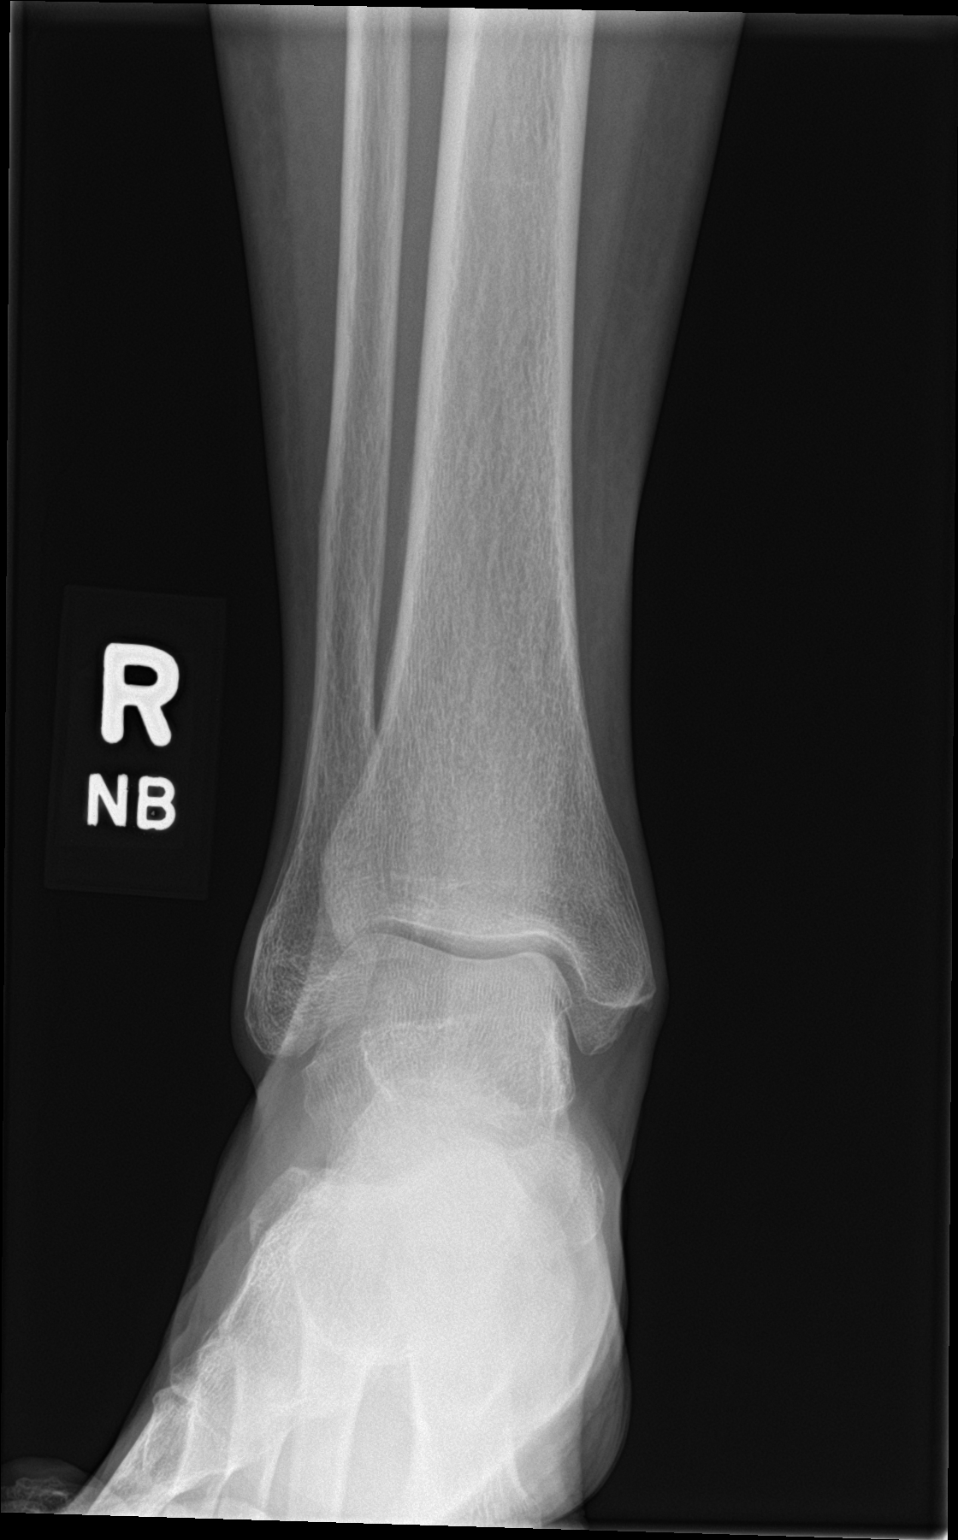

[ankle obl]
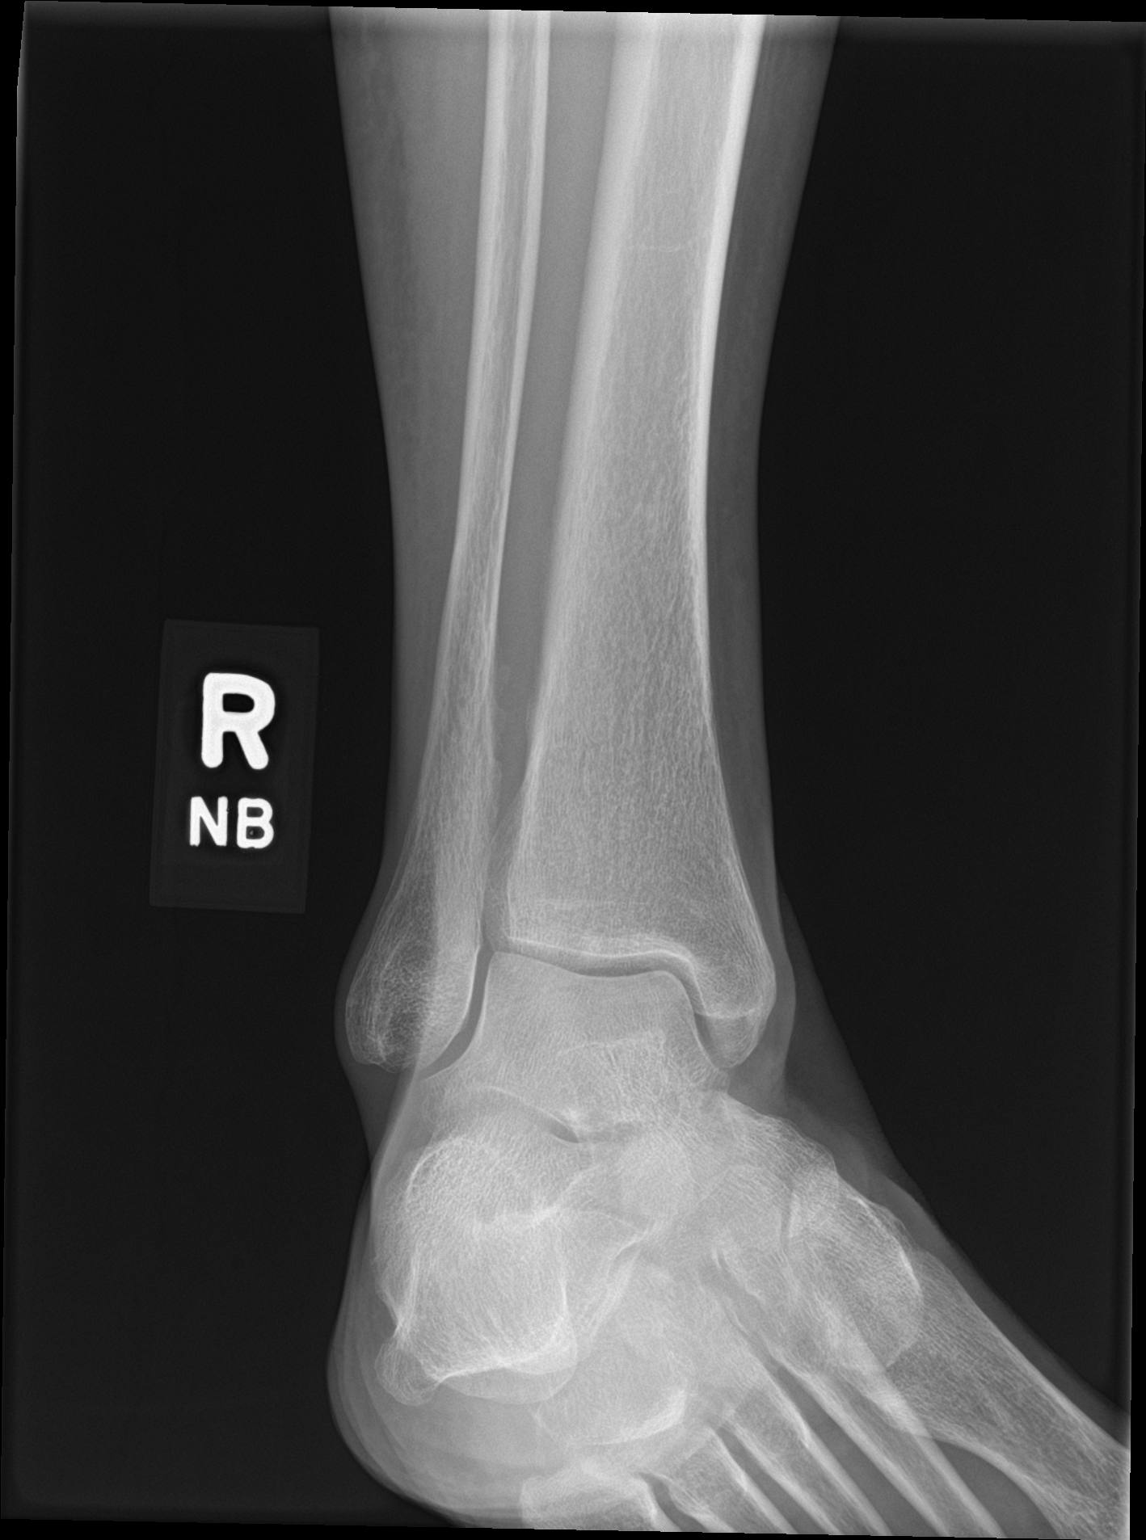

[ankle lat]
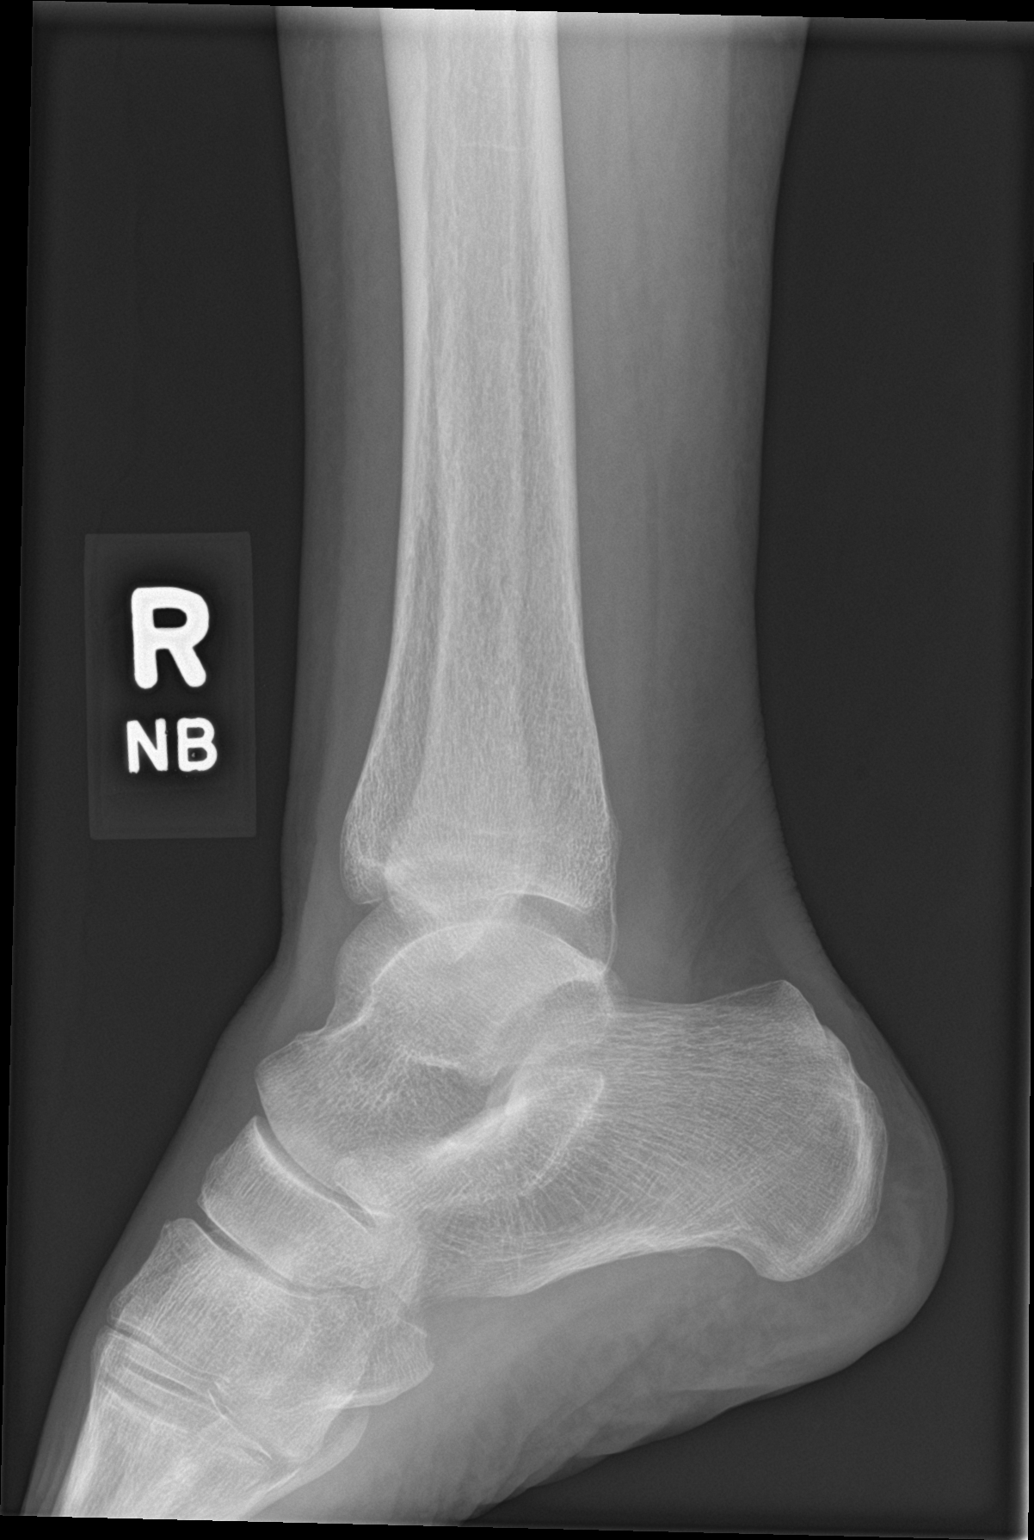

[3 of 3 positions shown; findings below may reference images not displayed]

FINDINGS: There is no evidence of fracture, dislocation, or joint effusion.
There is no evidence of arthropathy or other focal bone abnormality.
Soft tissues are unremarkable.
IMPRESSION: Negative.

## 2021-08-21 IMAGING — DX DG FOOT COMPLETE 3+V*L*
3 series · 3 of 3 positions shown · non-contrast
Comparison: None Available.

CLINICAL DATA: Fall multiple weeks ago with pain.

EXAM:
LEFT FOOT - COMPLETE 3+ VIEW; LEFT ANKLE COMPLETE - 3+ VIEW

[foot ap]
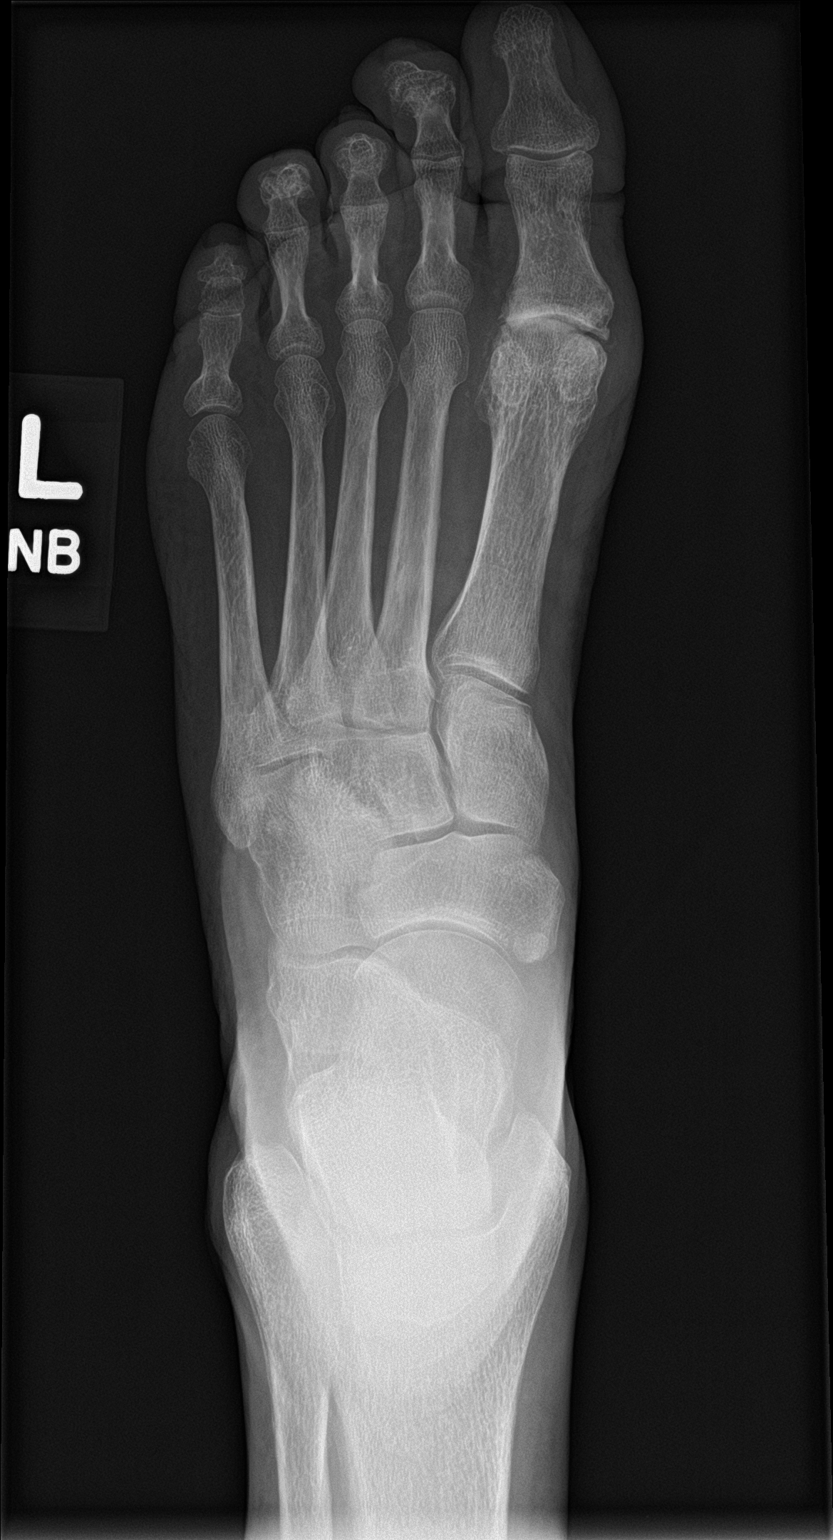

[foot obl]
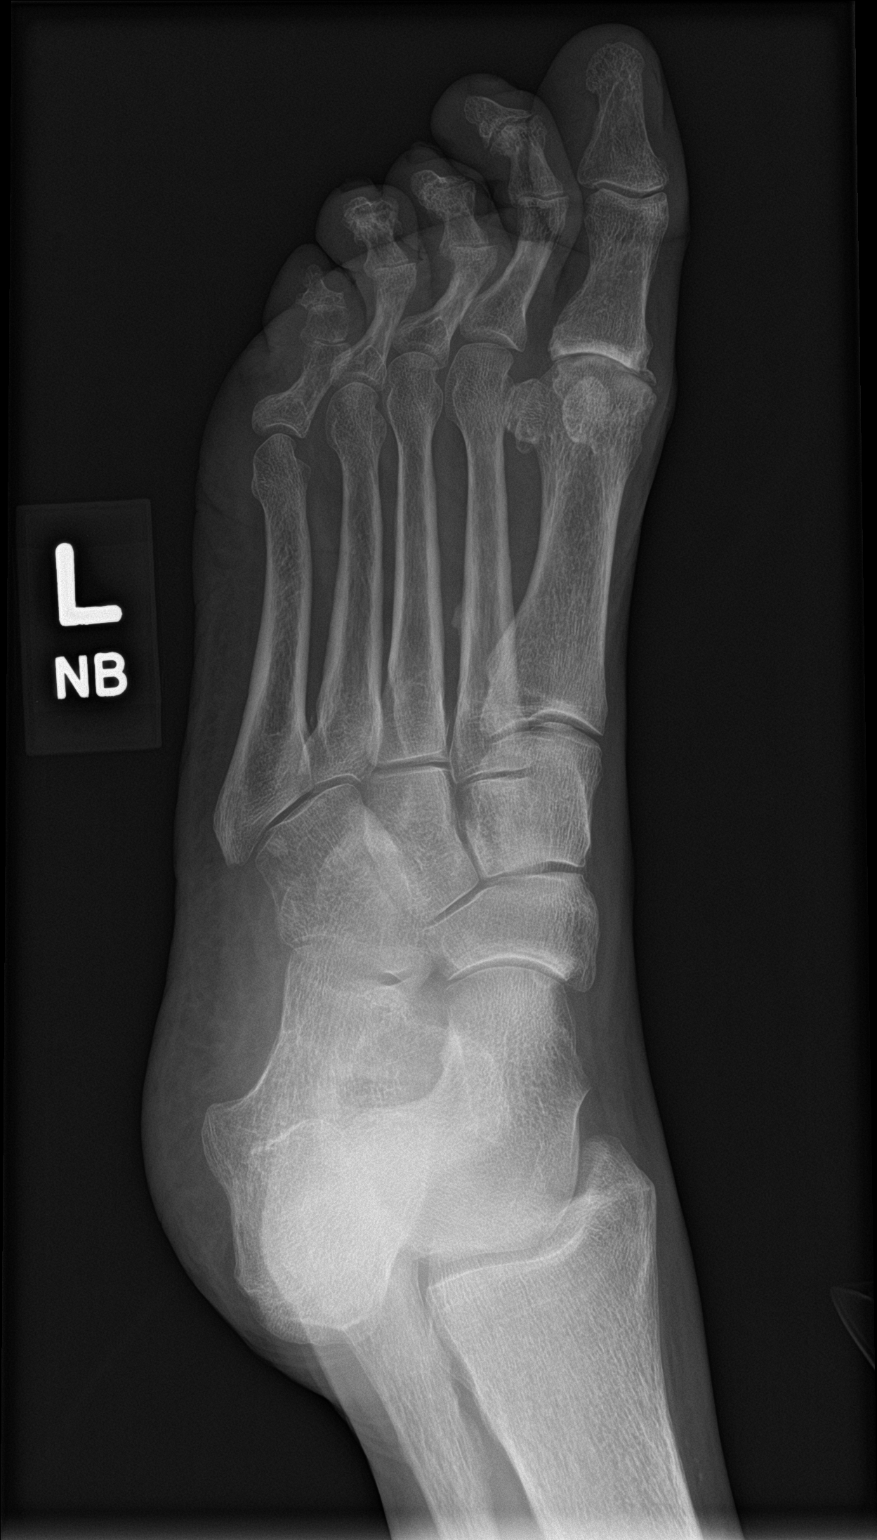

[foot lat]
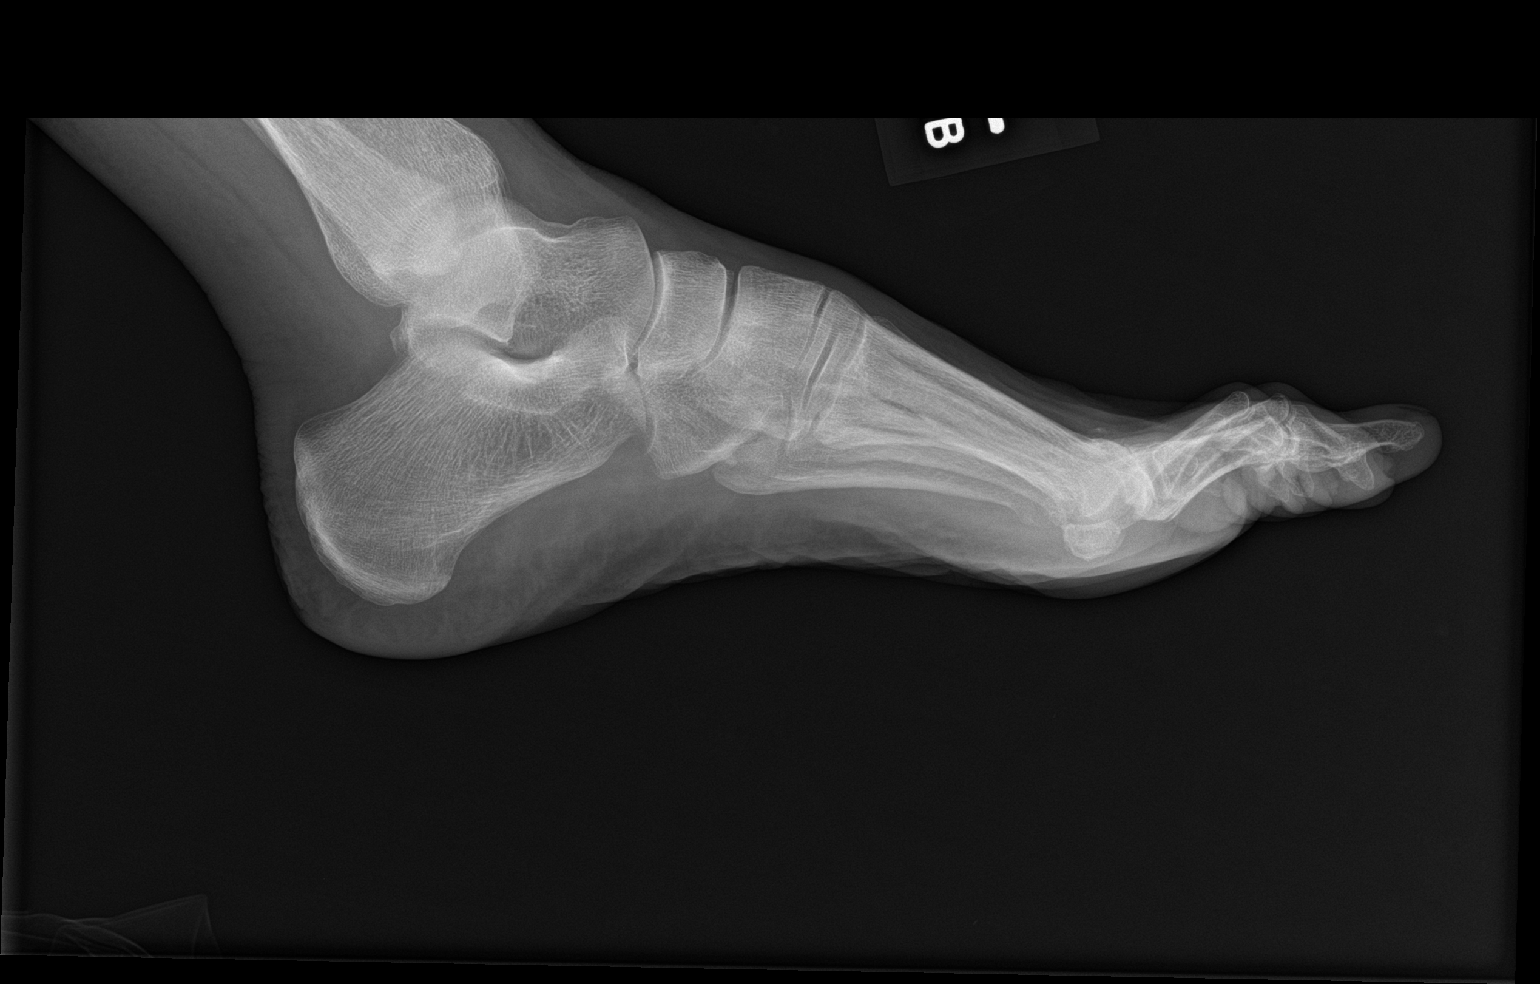

[3 of 3 positions shown; findings below may reference images not displayed]

FINDINGS: There is no evidence of fracture or dislocation in the ankle or
foot. Degenerative changes are present at the first
metatarsophalangeal joint. Soft tissues are unremarkable.
IMPRESSION: No acute fracture or dislocation.

## 2021-08-21 MED ORDER — ESTRADIOL 0.1 MG/GM VA CREA
1.0000 | TOPICAL_CREAM | VAGINAL | 11 refills | Status: DC
Start: 2021-08-21 — End: 2022-12-29
  Filled 2021-08-21: qty 42.5, 30d supply, fill #0
  Filled 2021-12-17: qty 42.5, 30d supply, fill #1

## 2021-08-21 NOTE — Progress Notes (Signed)
Acute Office Visit  Subjective:     Patient ID: Robin Conrad, female    DOB: 08/14/1950, 71 y.o.   MRN: [id number]  Chief Complaint  Patient presents with   Follow-up         HPI Patient is in today for bilateral ankle pain and knee pain after fall.  Please see previous video visit.  I had asked her to come in today so that we could take a look at her musculoskeletal concerns.  She fell several times while traveling.  In had significant bruising on both bilateral elbows and knees.  She is having a lot of tenderness and pain and swelling around the left ankle medially and laterally.  As well as on the distal left foot.  She is also having some pain medially on her right ankle with no significant swelling or bruising.  She has been icing and elevating.  She has not tried wearing the Ace wrap yet.   ROS      Objective:    BP (!) 105/58   Pulse 86   Ht 5' 5.5" (1.664 m)   SpO2 98%   BMI 20.48 kg/m    Physical Exam Vitals reviewed.  Constitutional:      Appearance: She is well-developed.  HENT:     Head: Normocephalic and atraumatic.  Eyes:     Conjunctiva/sclera: Conjunctivae normal.  Cardiovascular:     Rate and Rhythm: Normal rate.  Pulmonary:     Effort: Pulmonary effort is normal.  Musculoskeletal:     Comments: Both elbows are bruised.  There are some scrapes that are healing as well.  No open wounds.  Just some mild tenderness medially bilaterally.  Some tenderness over the tip of the olecranon on the left side.  She has significant bruising over both knees but they are both in the healing process she has a little bit of swelling medially on that left knee.  She has some edema around her left ankle as well as some bruising.  She is tender medially and laterally around the malleoli.  On right ankle tenderness medially.  She also has some pain on her left distal foot near the into the metatarsal heads.  But she does not have pain in that area when she walks.   Skin:    General: Skin is dry.     Coloration: Skin is not pale.  Neurological:     Mental Status: She is alert and oriented to person, place, and time.  Psychiatric:        Behavior: Behavior normal.     No results found for any visits on 08/21/21.      Assessment & Plan:   Problem List Items Addressed This Visit   None Visit Diagnoses     Acute left ankle pain    -  Primary   Relevant Orders   DG Foot Complete Left   DG Ankle Complete Left   Acute right ankle pain       Relevant Orders   DG Ankle Complete Right   Left foot pain       Relevant Orders   DG Foot Complete Left   DG Ankle Complete Left      We will start with plain film x-rays to rule out fracture.  I am most concerned about her left ankle.  In the meantime recommend ice, compression, elevation, and gentle range of motion.  Meds ordered this encounter  Medications   estradiol (ESTRACE) 0.1  MG/GM vaginal cream    Sig: Place 1 Applicatorful vaginally once a week.    Dispense:  42.5 g    Refill:  11    No follow-ups on file.  Beatrice Lecher, MD

## 2021-08-21 NOTE — Progress Notes (Signed)
Pt stated that the swelling has gone down in her ankles. She has bruising on her L lower leg.

## 2021-08-24 ENCOUNTER — Telehealth: Payer: Self-pay | Admitting: *Deleted

## 2021-08-24 NOTE — Progress Notes (Signed)
Hi Anuja, no sign of fracture on the left ankle.

## 2021-08-24 NOTE — Progress Notes (Signed)
Hi Robin Conrad, x-ray of the foot looks good.  No evidence of fracture or dislocation.  Hopefully it is improving over the weekend and I would start working on some gentle range of motion making pointing and flexing motions as well as circles in each direction with your ankles.

## 2021-08-24 NOTE — Telephone Encounter (Signed)
Pt reports that she has an odor and fullness and stated that this is what she experienced while she was in [place] when she had a UTI.  She also wanted Dr. Madilyn Fireman to know that her cough has not gotten better.   Allergies and pharmacy reviewed.

## 2021-08-24 NOTE — Progress Notes (Signed)
Hi Robin Conrad, great news!  No sign of fracture on the right ankle.

## 2021-08-24 NOTE — Telephone Encounter (Signed)
Pt scheduled for tomorrow at 1030 for NV.

## 2021-08-25 ENCOUNTER — Ambulatory Visit (INDEPENDENT_AMBULATORY_CARE_PROVIDER_SITE_OTHER): Payer: Medicare (Managed Care) | Admitting: Family Medicine

## 2021-08-25 DIAGNOSIS — R300 Dysuria: Secondary | ICD-10-CM

## 2021-08-25 DIAGNOSIS — R8290 Unspecified abnormal findings in urine: Secondary | ICD-10-CM

## 2021-08-25 LAB — POCT URINALYSIS DIPSTICK
Bilirubin, UA: NEGATIVE
Glucose, UA: NEGATIVE
Ketones, UA: NEGATIVE
Nitrite, UA: POSITIVE
Protein, UA: POSITIVE — AB
Spec Grav, UA: 1.015 (ref 1.010–1.025)
Urobilinogen, UA: 0.2 E.U./dL
pH, UA: 7 (ref 5.0–8.0)

## 2021-08-25 NOTE — Progress Notes (Unsigned)
Pt c/o dysuria and malodorous urine.  Pt denies flank pain, urinary frequency, urinary urgency, N/V, fever, or chills.  POC UA performed, which was positive for leukocytes and nitrites.  Urine culture sent to [facility].  Charyl Bigger, CMA

## 2021-08-26 ENCOUNTER — Other Ambulatory Visit (HOSPITAL_BASED_OUTPATIENT_CLINIC_OR_DEPARTMENT_OTHER): Payer: Self-pay

## 2021-08-26 MED ORDER — SULFAMETHOXAZOLE-TRIMETHOPRIM 800-160 MG PO TABS
1.0000 | ORAL_TABLET | Freq: Two times a day (BID) | ORAL | 0 refills | Status: DC
  Filled 2021-08-26: qty 6, 3d supply, fill #0

## 2021-08-26 NOTE — Progress Notes (Signed)
Agree with documentation as above.  We will go ahead and send over Bactrim DS since nitrites are positive.  Will await urine culture for final sensitivities.  Beatrice Lecher, MD

## 2021-08-26 NOTE — Progress Notes (Signed)
Pt informed of RX.  Pt expressed understanding.  Charyl Bigger, CMA

## 2021-08-28 LAB — CULTURE, URINE COMPREHENSIVE
MICRO NUMBER:: 13523165
SPECIMEN QUALITY:: ADEQUATE

## 2021-08-28 NOTE — Progress Notes (Signed)
Culture confirmed E.coli in urine and bactrim should treat perfectly. Complete bactrim and drink plenty of fluids. Let us know if symptoms do not completely clear.

## 2021-09-04 ENCOUNTER — Other Ambulatory Visit (HOSPITAL_BASED_OUTPATIENT_CLINIC_OR_DEPARTMENT_OTHER): Payer: Self-pay

## 2021-09-07 ENCOUNTER — Telehealth: Payer: Self-pay

## 2021-09-07 DIAGNOSIS — M47812 Spondylosis without myelopathy or radiculopathy, cervical region: Secondary | ICD-10-CM

## 2021-09-07 NOTE — Telephone Encounter (Signed)
 Since the facet joint injections seems to historically help for the most (more than the epidural ) if they are giving waning efficacy then the next step would be to consider cervical facet radiofrequency ablation likely with Dr. Rosella.

## 2021-09-10 ENCOUNTER — Other Ambulatory Visit (HOSPITAL_BASED_OUTPATIENT_CLINIC_OR_DEPARTMENT_OTHER): Payer: Self-pay

## 2021-09-10 MED ORDER — AMOXICILLIN 500 MG PO CAPS
ORAL_CAPSULE | ORAL | 0 refills | Status: DC
  Filled 2021-09-10: qty 13, 4d supply, fill #0

## 2021-09-10 NOTE — Telephone Encounter (Signed)
Left msg for a return call from patient.  

## 2021-09-11 ENCOUNTER — Ambulatory Visit (INDEPENDENT_AMBULATORY_CARE_PROVIDER_SITE_OTHER): Payer: Medicare (Managed Care) | Admitting: Family Medicine

## 2021-09-11 ENCOUNTER — Other Ambulatory Visit (HOSPITAL_BASED_OUTPATIENT_CLINIC_OR_DEPARTMENT_OTHER): Payer: Self-pay

## 2021-09-11 VITALS — BP 131/64 | HR 86 | Ht 65.5 in | Wt 129.0 lb

## 2021-09-11 DIAGNOSIS — N390 Urinary tract infection, site not specified: Secondary | ICD-10-CM | POA: Diagnosis not present

## 2021-09-11 DIAGNOSIS — R39198 Other difficulties with micturition: Secondary | ICD-10-CM

## 2021-09-11 DIAGNOSIS — N3000 Acute cystitis without hematuria: Secondary | ICD-10-CM | POA: Diagnosis not present

## 2021-09-11 LAB — POCT URINALYSIS DIP (CLINITEK)
Bilirubin, UA: NEGATIVE
Glucose, UA: NEGATIVE mg/dL
Ketones, POC UA: NEGATIVE mg/dL
Nitrite, UA: POSITIVE — AB
POC PROTEIN,UA: NEGATIVE
Spec Grav, UA: 1.015 (ref 1.010–1.025)
Urobilinogen, UA: 0.2 E.U./dL
pH, UA: 6 (ref 5.0–8.0)

## 2021-09-11 MED ORDER — NITROFURANTOIN MONOHYD MACRO 100 MG PO CAPS
100.0000 mg | ORAL_CAPSULE | Freq: Every day | ORAL | 1 refills | Status: DC
Start: 2021-09-11 — End: 2022-01-21
  Filled 2021-09-11 – 2021-09-16 (×2): qty 90, 90d supply, fill #0
  Filled 2021-10-19 – 2021-12-17 (×2): qty 90, 90d supply, fill #1

## 2021-09-11 MED ORDER — NITROFURANTOIN MONOHYD MACRO 100 MG PO CAPS
100.0000 mg | ORAL_CAPSULE | Freq: Two times a day (BID) | ORAL | 0 refills | Status: DC
  Filled 2021-09-11: qty 10, 5d supply, fill #0

## 2021-09-11 NOTE — Progress Notes (Signed)
Pt presents today with UTI symptoms: fullness, odor, and painful urination. Pt states this is her 3rd UTI in 1.5 months. Recurring every 2 weeks.   Per Dr. Madilyn Fireman: She will treat the current UTI. 1. Pt can see Urology or 2. Low dose ABX for 6 - 12 months.   Ms. Robin Conrad requests to start low dose ABX.

## 2021-09-11 NOTE — Addendum Note (Signed)
Addended by: Silverio Decamp on: 09/11/2021 12:00 PM   Modules accepted: Orders

## 2021-09-11 NOTE — Addendum Note (Signed)
Addended by: Peggye Ley on: 09/11/2021 11:45 AM   Modules accepted: Orders

## 2021-09-11 NOTE — Telephone Encounter (Signed)
Pt has been made aware of radiofrequency ablation.   Requesting a referral to Dr. Francesco Runner for consultation.

## 2021-09-11 NOTE — Progress Notes (Signed)
Will send for culture.

## 2021-09-11 NOTE — Progress Notes (Signed)
Urinary tract infection-this is her third infection in a short period of time.  Last urine culture was 2 weeks ago and grew out E. coli.  It was pansensitive and she was treated with Bactrim.  We will send over prescription for Macrobid this time.  We will send another culture.  And will discuss option of either prophylaxis or urology referral. Meds ordered this encounter  Medications   nitrofurantoin, macrocrystal-monohydrate, (MACROBID) 100 MG capsule    Sig: Take 1 capsule (100 mg total) by mouth 2 (two) times daily.    Dispense:  10 capsule    Refill:  0   nitrofurantoin, macrocrystal-monohydrate, (MACROBID) 100 MG capsule    Sig: Take 1 capsule (100 mg total) by mouth at bedtime. Please start after completing treatment antibiotic which is twice a day.    Dispense:  90 capsule    Refill:  1

## 2021-09-11 NOTE — Telephone Encounter (Signed)
Referral placed.

## 2021-09-14 ENCOUNTER — Other Ambulatory Visit (HOSPITAL_BASED_OUTPATIENT_CLINIC_OR_DEPARTMENT_OTHER): Payer: Self-pay

## 2021-09-14 LAB — URINE CULTURE
MICRO NUMBER:: 13598072
SPECIMEN QUALITY:: ADEQUATE

## 2021-09-14 NOTE — Progress Notes (Signed)
Hi Robin Conrad, culture grew out E. coli and it is sensitive to the Macrobid medication that I put you on.  So make sure to complete the full course.  And when done you can start the once a day tab to help prevent UTIs we will see how this works over the next 6 months.

## 2021-09-15 ENCOUNTER — Other Ambulatory Visit (HOSPITAL_BASED_OUTPATIENT_CLINIC_OR_DEPARTMENT_OTHER): Payer: Self-pay

## 2021-09-16 ENCOUNTER — Other Ambulatory Visit (HOSPITAL_BASED_OUTPATIENT_CLINIC_OR_DEPARTMENT_OTHER): Payer: Self-pay

## 2021-09-17 ENCOUNTER — Telehealth: Payer: Self-pay | Admitting: Family Medicine

## 2021-09-17 ENCOUNTER — Other Ambulatory Visit: Payer: Self-pay

## 2021-09-17 DIAGNOSIS — M810 Age-related osteoporosis without current pathological fracture: Secondary | ICD-10-CM

## 2021-09-17 NOTE — Telephone Encounter (Signed)
Patient aware that a PA is required from her insurance for the Prolia. This has been initiated today with documents faxed for review. Reminded patient to come get bloodwork done at her convenience as this is required prior to injection.

## 2021-09-17 NOTE — Telephone Encounter (Signed)
Pt called. She states she has a card stating her prolia injection is due tomorrow 7/7.

## 2021-09-18 NOTE — Telephone Encounter (Signed)
Auth # [id number]  Good from 09/17/21 thru 09/18/22 for 2 injections  Patient needs to come have labs done prior to injection. Order is in.

## 2021-09-18 NOTE — Telephone Encounter (Signed)
Patient stated she would walk in to have Labs done next week as she is currently at the store & didn't want to schedule a Lab appt as she didn't know her schedule. She said she would schedule her Prolia inj the day she comes in for blood work. AMUCK

## 2021-09-22 ENCOUNTER — Other Ambulatory Visit: Payer: Self-pay

## 2021-09-22 DIAGNOSIS — M810 Age-related osteoporosis without current pathological fracture: Secondary | ICD-10-CM

## 2021-09-22 NOTE — Progress Notes (Signed)
Released orders for labs.  Charyl Bigger, CMA

## 2021-09-23 LAB — COMPLETE METABOLIC PANEL WITH GFR
AG Ratio: 2 (calc) (ref 1.0–2.5)
ALT: 19 U/L (ref 6–29)
AST: 24 U/L (ref 10–35)
Albumin: 4.3 g/dL (ref 3.6–5.1)
Alkaline phosphatase (APISO): 106 U/L (ref 37–153)
BUN: 19 mg/dL (ref 7–25)
CO2: 26 mmol/L (ref 20–32)
Calcium: 9.7 mg/dL (ref 8.6–10.4)
Chloride: 104 mmol/L (ref 98–110)
Creat: 0.94 mg/dL (ref 0.60–1.00)
Globulin: 2.2 g/dL (calc) (ref 1.9–3.7)
Glucose, Bld: 81 mg/dL (ref 65–99)
Potassium: 4.5 mmol/L (ref 3.5–5.3)
Sodium: 140 mmol/L (ref 135–146)
Total Bilirubin: 0.5 mg/dL (ref 0.2–1.2)
Total Protein: 6.5 g/dL (ref 6.1–8.1)
eGFR: 64 mL/min/{1.73_m2} (ref >=60)

## 2021-09-23 NOTE — Progress Notes (Signed)
Your lab work is within acceptable range and there are no concerning findings.   ?

## 2021-09-24 ENCOUNTER — Ambulatory Visit (INDEPENDENT_AMBULATORY_CARE_PROVIDER_SITE_OTHER): Payer: Medicare (Managed Care) | Admitting: Family Medicine

## 2021-09-24 VITALS — BP 113/60 | HR 82 | Temp 97.7°F | Ht 64.0 in | Wt 126.0 lb

## 2021-09-24 DIAGNOSIS — M810 Age-related osteoporosis without current pathological fracture: Secondary | ICD-10-CM | POA: Diagnosis not present

## 2021-09-24 MED ORDER — DENOSUMAB 60 MG/ML ~~LOC~~ SOSY
60.0000 mg | PREFILLED_SYRINGE | Freq: Once | SUBCUTANEOUS | Status: AC
  Administered 2021-09-24: 60 mg via SUBCUTANEOUS

## 2021-09-24 NOTE — Progress Notes (Signed)
Agree with documentation as above.   Robin Graybeal, MD  

## 2021-09-24 NOTE — Progress Notes (Signed)
Patient is here for a Prolia injection. Patient reports taking calcium and vitamin D daily. Patient's calcium levels and kidney function was within normal limits.   Patient tolerated injection on Left arm well without complications. Patient advised to schedule next injection in 6 months.

## 2021-09-28 DIAGNOSIS — M351 Other overlap syndromes: Secondary | ICD-10-CM | POA: Diagnosis not present

## 2021-09-28 DIAGNOSIS — Z791 Long term (current) use of non-steroidal anti-inflammatories (NSAID): Secondary | ICD-10-CM | POA: Diagnosis not present

## 2021-09-28 DIAGNOSIS — M797 Fibromyalgia: Secondary | ICD-10-CM | POA: Diagnosis not present

## 2021-10-19 ENCOUNTER — Other Ambulatory Visit (HOSPITAL_BASED_OUTPATIENT_CLINIC_OR_DEPARTMENT_OTHER): Payer: Self-pay

## 2021-10-19 ENCOUNTER — Other Ambulatory Visit: Payer: Self-pay | Admitting: Family Medicine

## 2021-10-19 DIAGNOSIS — M7541 Impingement syndrome of right shoulder: Secondary | ICD-10-CM

## 2021-10-19 DIAGNOSIS — M797 Fibromyalgia: Secondary | ICD-10-CM

## 2021-10-19 DIAGNOSIS — M5416 Radiculopathy, lumbar region: Secondary | ICD-10-CM

## 2021-10-20 MED ORDER — METHOCARBAMOL 500 MG PO TABS
500.0000 mg | ORAL_TABLET | Freq: Every evening | ORAL | 0 refills | Status: DC | PRN
  Filled 2021-10-20: qty 90, 90d supply, fill #0

## 2021-10-20 MED ORDER — TRAMADOL HCL 50 MG PO TABS
100.0000 mg | ORAL_TABLET | Freq: Three times a day (TID) | ORAL | 2 refills | Status: DC | PRN
  Filled 2021-10-20: qty 60, 10d supply, fill #0
  Filled 2021-11-24: qty 60, 10d supply, fill #1
  Filled 2021-12-21: qty 60, 10d supply, fill #2

## 2021-10-20 NOTE — Telephone Encounter (Signed)
Tramadol refill:  Last OV: 09/11/21 Next OV: 10/22/21 Last RF: 07/16/21

## 2021-10-21 ENCOUNTER — Other Ambulatory Visit (HOSPITAL_BASED_OUTPATIENT_CLINIC_OR_DEPARTMENT_OTHER): Payer: Self-pay

## 2021-10-22 ENCOUNTER — Other Ambulatory Visit (HOSPITAL_BASED_OUTPATIENT_CLINIC_OR_DEPARTMENT_OTHER): Payer: Self-pay

## 2021-10-22 ENCOUNTER — Encounter: Payer: Self-pay | Admitting: Family Medicine

## 2021-10-22 ENCOUNTER — Ambulatory Visit (INDEPENDENT_AMBULATORY_CARE_PROVIDER_SITE_OTHER): Payer: Medicare (Managed Care) | Admitting: Family Medicine

## 2021-10-22 VITALS — BP 126/61 | HR 83 | Ht 64.0 in | Wt 127.0 lb

## 2021-10-22 DIAGNOSIS — M797 Fibromyalgia: Secondary | ICD-10-CM

## 2021-10-22 DIAGNOSIS — K581 Irritable bowel syndrome with constipation: Secondary | ICD-10-CM

## 2021-10-22 DIAGNOSIS — M47812 Spondylosis without myelopathy or radiculopathy, cervical region: Secondary | ICD-10-CM | POA: Diagnosis not present

## 2021-10-22 DIAGNOSIS — R42 Dizziness and giddiness: Secondary | ICD-10-CM | POA: Diagnosis not present

## 2021-10-22 MED ORDER — BUPROPION HCL ER (XL) 150 MG PO TB24
ORAL_TABLET | Freq: Every morning | ORAL | 1 refills | Status: DC
Start: 2021-10-22 — End: 2021-12-31
  Filled 2021-10-22: qty 90, 30d supply, fill #0
  Filled 2021-12-02: qty 90, 30d supply, fill #1

## 2021-10-22 NOTE — Progress Notes (Signed)
Established Patient Office Visit  Subjective   Patient ID: Robin Conrad, female    DOB: 1951/03/15  Age: 71 y.o. MRN: [id number]  Chief Complaint  Patient presents with   Follow-up    HPI  Follow-up fibromyalgia-she typically takes a half of a methocarbamol in the morning and a whole in the evening.  She also takes her tramadol at night and she is taking 1200 mg of gabapentin twice a day.  She has been having some persistent neck pain particularly on the right side she has been seeing Dr. Francesco Runner and Dr. Dianah Field.  They are talking about possible ablation.  He does have her eye exam scheduled this fall.  She has fallen 1 more time since she got home from her recent trip where she had slipped off a boat when the boat moved.  She was putting a flag in the yard and she was bending over and she has noticed that when she bends over she will get pressure in her head and it throws her off balance.      ROS    Objective:     BP 126/61   Pulse 83   Ht '5\' 4"'$  (1.626 m)   Wt 127 lb (57.6 kg)   SpO2 97%   BMI 21.80 kg/m    Physical Exam Vitals and nursing note reviewed.  Constitutional:      Appearance: She is well-developed.  HENT:     Head: Normocephalic and atraumatic.  Cardiovascular:     Rate and Rhythm: Normal rate and regular rhythm.     Heart sounds: Normal heart sounds.  Pulmonary:     Effort: Pulmonary effort is normal.     Breath sounds: Normal breath sounds.  Skin:    General: Skin is warm and dry.  Neurological:     Mental Status: She is alert and oriented to person, place, and time.  Psychiatric:        Behavior: Behavior normal.      No results found for any visits on 10/22/21.    The 10-year ASCVD risk score (Arnett DK, et al., 2019) is: 10.4%    Assessment & Plan:   Problem List Items Addressed This Visit       Digestive   Irritable bowel syndrome with constipation    She is not on Amitiza or Linzess will take this off the  medication list she is just taking an over-the-counter laxative and that seems to be working well right now, and is less costly.        Musculoskeletal and Integument   Cervical spondylosis    Working with Dr. Francesco Runner and Dr. Dianah Field on treatment.  She says some days the pain is quite intense and severe and other days it is more tolerable.        Other   Fibromyalgia    Fairly stable on her current regimen dealing with some increased cervical spine pain and discussing ablation for that.  We did discuss that several of her medications can cause increase sedation dizziness and even increased risk for falls so were going to start taking a look at that and may be making some changes.      Relevant Medications   buPROPion (WELLBUTRIN XL) 150 MG 24 hr tablet   Other Visit Diagnoses     Dizziness    -  Primary       Dizziness-we discussed a couple of options to try to reduce her risk for falls and dizziness  in regards to her current medications.  Right now she takes 3 pretty sedative medications at night including her gabapentin, tramadol, and methocarbamol which does cause some sedation.  She is can to try moving her med tramadol to the morning which might even improve some of her neck pain during the day and see if that is helpful.  If it does not we also discussed decreasing her gabapentin.  Currently taking 12 mg twice daily we could certainly try decreasing that down to see if it still effective for pain control but causing less dizziness sedation and brain fog.  Return in about 3 months (around 01/22/2022) for fibromyalgia and dizziness .   I spent 40 minutes on the day of the encounter to include pre-visit record review, face-to-face time with the patient and post visit ordering of test.   Beatrice Lecher, MD

## 2021-10-22 NOTE — Patient Instructions (Signed)
Try moving tramadol to the morning. If not helpful after a week or 2 then please let me know and we can make a slight decrease adjustment on the gabapentin and see if you find this helpful but still controls her pain.

## 2021-10-22 NOTE — Assessment & Plan Note (Signed)
Fairly stable on her current regimen dealing with some increased cervical spine pain and discussing ablation for that.  We did discuss that several of her medications can cause increase sedation dizziness and even increased risk for falls so were going to start taking a look at that and may be making some changes.

## 2021-10-22 NOTE — Assessment & Plan Note (Signed)
Working with Dr. Francesco Runner and Dr. Dianah Field on treatment.  She says some days the pain is quite intense and severe and other days it is more tolerable.

## 2021-10-22 NOTE — Assessment & Plan Note (Signed)
She is not on Amitiza or Linzess will take this off the medication list she is just taking an over-the-counter laxative and that seems to be working well right now, and is less costly.

## 2021-10-30 ENCOUNTER — Other Ambulatory Visit (HOSPITAL_BASED_OUTPATIENT_CLINIC_OR_DEPARTMENT_OTHER): Payer: Self-pay

## 2021-11-24 ENCOUNTER — Other Ambulatory Visit (HOSPITAL_BASED_OUTPATIENT_CLINIC_OR_DEPARTMENT_OTHER): Payer: Self-pay

## 2021-11-24 ENCOUNTER — Other Ambulatory Visit: Payer: Self-pay | Admitting: Gastroenterology

## 2021-11-24 MED ORDER — FAMOTIDINE 20 MG PO TABS
ORAL_TABLET | ORAL | 1 refills | Status: DC
  Filled 2021-11-24: qty 90, 45d supply, fill #0

## 2021-11-30 DIAGNOSIS — Z791 Long term (current) use of non-steroidal anti-inflammatories (NSAID): Secondary | ICD-10-CM | POA: Diagnosis not present

## 2021-11-30 DIAGNOSIS — M351 Other overlap syndromes: Secondary | ICD-10-CM | POA: Diagnosis not present

## 2021-11-30 DIAGNOSIS — M797 Fibromyalgia: Secondary | ICD-10-CM | POA: Diagnosis not present

## 2021-12-02 ENCOUNTER — Other Ambulatory Visit (HOSPITAL_BASED_OUTPATIENT_CLINIC_OR_DEPARTMENT_OTHER): Payer: Self-pay

## 2021-12-02 ENCOUNTER — Ambulatory Visit
Admission: EM | Admit: 2021-12-02 | Discharge: 2021-12-02 | Disposition: A | Discharge status: Home / Self Care | Payer: Medicare (Managed Care) | Attending: Family Medicine | Admitting: Family Medicine

## 2021-12-02 ENCOUNTER — Encounter: Payer: Self-pay | Admitting: Emergency Medicine

## 2021-12-02 DIAGNOSIS — S0990XA Unspecified injury of head, initial encounter: Secondary | ICD-10-CM

## 2021-12-02 DIAGNOSIS — S0181XA Laceration without foreign body of other part of head, initial encounter: Secondary | ICD-10-CM

## 2021-12-02 NOTE — ED Triage Notes (Signed)
Pt states about 1 hour ago she was walking down the sidewalk and she tripped and fell on the concrete, she has abrasions/lacerations on her forehead. She is A&O x4 but is concerned about the laceration. She is not on blood thinners

## 2021-12-02 NOTE — Discharge Instructions (Signed)
If you develop any headache, nausea, dizziness or worrisome symptoms, GO TO ER  Keep area clean and reasonably dry Apply antibiotic ointment 3 x a day Stitches out in 5-7 days
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
## 2021-12-03 ENCOUNTER — Telehealth: Payer: Self-pay | Admitting: Emergency Medicine

## 2021-12-03 NOTE — Telephone Encounter (Signed)
Sore but no other problems. Laceration is bleeding a little, I advised her to let us know if it gets any worse. She acknowledges understanding

## 2021-12-05 ENCOUNTER — Ambulatory Visit
Admission: EM | Admit: 2021-12-05 | Discharge: 2021-12-05 | Disposition: A | Discharge status: Home / Self Care | Payer: Medicare (Managed Care) | Attending: Urgent Care | Admitting: Urgent Care

## 2021-12-05 ENCOUNTER — Other Ambulatory Visit: Payer: Self-pay

## 2021-12-05 ENCOUNTER — Ambulatory Visit (INDEPENDENT_AMBULATORY_CARE_PROVIDER_SITE_OTHER): Payer: Medicare (Managed Care)

## 2021-12-05 DIAGNOSIS — L089 Local infection of the skin and subcutaneous tissue, unspecified: Secondary | ICD-10-CM | POA: Diagnosis not present

## 2021-12-05 DIAGNOSIS — H02843 Edema of right eye, unspecified eyelid: Secondary | ICD-10-CM

## 2021-12-05 DIAGNOSIS — S0081XD Abrasion of other part of head, subsequent encounter: Secondary | ICD-10-CM

## 2021-12-05 DIAGNOSIS — S0990XD Unspecified injury of head, subsequent encounter: Secondary | ICD-10-CM

## 2021-12-05 DIAGNOSIS — W19XXXD Unspecified fall, subsequent encounter: Secondary | ICD-10-CM | POA: Diagnosis not present

## 2021-12-05 DIAGNOSIS — H02846 Edema of left eye, unspecified eyelid: Secondary | ICD-10-CM

## 2021-12-05 MED ORDER — CEPHALEXIN 500 MG PO CAPS: 500.0000 mg | ORAL_CAPSULE | Freq: Four times a day (QID) | ORAL | 0 refills | Status: AC

## 2021-12-05 NOTE — ED Triage Notes (Addendum)
Pt presents to Urgent Care with c/o periorbital swelling and pain since last night. Denies itching and vision changes. Fall w/ forehead injury/laceration 4 days ago.
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
## 2021-12-05 NOTE — Discharge Instructions (Signed)
Your CT scan does not show any evidence of an orbital or skull fracture. The edema is likely dependent edema due to the swelling from your abrasion. I do have concern the abrasion might be becoming infected. Please start the antibiotic prescribed for the next 4 days. Continue taking your Macrobid for your UTI suppression. Return to our office in 3 days for suture removal. Ice your forehead and eyelids to help with the swelling.

## 2021-12-06 ENCOUNTER — Telehealth: Payer: Self-pay | Admitting: Emergency Medicine

## 2021-12-06 NOTE — Telephone Encounter (Signed)
Spoke with patient about status of eyes. She states now her eyes are bruised looking as having black eyes. She will be back in for stitches removal. She is taking abx as directed.

## 2021-12-08 ENCOUNTER — Encounter: Payer: Self-pay | Admitting: Emergency Medicine

## 2021-12-08 ENCOUNTER — Ambulatory Visit
Admission: EM | Admit: 2021-12-08 | Discharge: 2021-12-08 | Disposition: A | Discharge status: Home / Self Care | Payer: Medicare (Managed Care) | Attending: Family Medicine | Admitting: Family Medicine

## 2021-12-08 DIAGNOSIS — Z4802 Encounter for removal of sutures: Secondary | ICD-10-CM

## 2021-12-08 NOTE — ED Provider Notes (Signed)
Patient is here for suture removal only.  No complaints   Robin Everts, MD 12/08/21 1234

## 2021-12-08 NOTE — ED Triage Notes (Signed)
Patient here for suture removal.  Forehead laceration on 12/02/2021.  Sutures look well and healed from laceration.  Will remove today.

## 2021-12-17 ENCOUNTER — Other Ambulatory Visit: Payer: Self-pay | Admitting: Gastroenterology

## 2021-12-17 ENCOUNTER — Other Ambulatory Visit (HOSPITAL_BASED_OUTPATIENT_CLINIC_OR_DEPARTMENT_OTHER): Payer: Self-pay

## 2021-12-17 ENCOUNTER — Other Ambulatory Visit: Payer: Self-pay | Admitting: Medical-Surgical

## 2021-12-17 MED ORDER — OMEPRAZOLE 20 MG PO CPDR
20.0000 mg | DELAYED_RELEASE_CAPSULE | Freq: Every day | ORAL | 0 refills | Status: DC
  Filled 2021-12-17: qty 90, 90d supply, fill #0

## 2021-12-17 MED ORDER — IPRATROPIUM BROMIDE 0.03 % NA SOLN
2.0000 | Freq: Two times a day (BID) | NASAL | 0 refills | Status: DC
  Filled 2021-12-17: qty 30, 75d supply, fill #0

## 2021-12-21 ENCOUNTER — Other Ambulatory Visit (HOSPITAL_BASED_OUTPATIENT_CLINIC_OR_DEPARTMENT_OTHER): Payer: Self-pay

## 2021-12-21 MED ORDER — AREXVY 120 MCG/0.5ML IM SUSR
INTRAMUSCULAR | 0 refills | Status: DC
  Filled 2021-12-21: qty 1, 1d supply, fill #0

## 2021-12-23 DIAGNOSIS — R1310 Dysphagia, unspecified: Secondary | ICD-10-CM | POA: Diagnosis not present

## 2021-12-23 DIAGNOSIS — J301 Allergic rhinitis due to pollen: Secondary | ICD-10-CM | POA: Diagnosis not present

## 2021-12-23 DIAGNOSIS — H6980 Other specified disorders of Eustachian tube, unspecified ear: Secondary | ICD-10-CM | POA: Diagnosis not present

## 2021-12-23 DIAGNOSIS — J3081 Allergic rhinitis due to animal (cat) (dog) hair and dander: Secondary | ICD-10-CM | POA: Diagnosis not present

## 2021-12-23 DIAGNOSIS — J4520 Mild intermittent asthma, uncomplicated: Secondary | ICD-10-CM | POA: Diagnosis not present

## 2021-12-31 ENCOUNTER — Other Ambulatory Visit: Payer: Self-pay | Admitting: Family Medicine

## 2021-12-31 ENCOUNTER — Other Ambulatory Visit (HOSPITAL_BASED_OUTPATIENT_CLINIC_OR_DEPARTMENT_OTHER): Payer: Self-pay

## 2021-12-31 MED ORDER — FINASTERIDE 5 MG PO TABS
5.0000 mg | ORAL_TABLET | Freq: Every day | ORAL | 2 refills | Status: AC
  Filled 2021-12-31: qty 30, 30d supply, fill #0

## 2021-12-31 MED ORDER — BUPROPION HCL ER (XL) 150 MG PO TB24
450.0000 mg | ORAL_TABLET | Freq: Every morning | ORAL | 1 refills | Status: DC
  Filled 2021-12-31: qty 90, 30d supply, fill #0

## 2022-01-19 ENCOUNTER — Other Ambulatory Visit (HOSPITAL_BASED_OUTPATIENT_CLINIC_OR_DEPARTMENT_OTHER): Payer: Self-pay

## 2022-01-19 DIAGNOSIS — B351 Tinea unguium: Secondary | ICD-10-CM | POA: Diagnosis not present

## 2022-01-19 DIAGNOSIS — L570 Actinic keratosis: Secondary | ICD-10-CM | POA: Diagnosis not present

## 2022-01-20 ENCOUNTER — Other Ambulatory Visit: Payer: Self-pay | Admitting: Gastroenterology

## 2022-01-20 ENCOUNTER — Other Ambulatory Visit: Payer: Self-pay | Admitting: Family Medicine

## 2022-01-20 DIAGNOSIS — M797 Fibromyalgia: Secondary | ICD-10-CM

## 2022-01-20 DIAGNOSIS — M7541 Impingement syndrome of right shoulder: Secondary | ICD-10-CM

## 2022-01-20 DIAGNOSIS — N390 Urinary tract infection, site not specified: Secondary | ICD-10-CM

## 2022-01-20 DIAGNOSIS — E039 Hypothyroidism, unspecified: Secondary | ICD-10-CM

## 2022-01-20 DIAGNOSIS — M5416 Radiculopathy, lumbar region: Secondary | ICD-10-CM

## 2022-01-20 DIAGNOSIS — F411 Generalized anxiety disorder: Secondary | ICD-10-CM

## 2022-01-25 ENCOUNTER — Encounter: Payer: Self-pay | Admitting: Family Medicine

## 2022-01-25 ENCOUNTER — Other Ambulatory Visit: Payer: Self-pay | Admitting: Family Medicine

## 2022-01-25 ENCOUNTER — Ambulatory Visit (INDEPENDENT_AMBULATORY_CARE_PROVIDER_SITE_OTHER): Payer: Medicare (Managed Care) | Admitting: Family Medicine

## 2022-01-25 VITALS — BP 120/68 | HR 85 | Ht 64.0 in | Wt 127.0 lb

## 2022-01-25 DIAGNOSIS — M792 Neuralgia and neuritis, unspecified: Secondary | ICD-10-CM

## 2022-01-25 DIAGNOSIS — F411 Generalized anxiety disorder: Secondary | ICD-10-CM | POA: Diagnosis not present

## 2022-01-25 DIAGNOSIS — M797 Fibromyalgia: Secondary | ICD-10-CM
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
## 2022-01-25 NOTE — Assessment & Plan Note (Signed)
Continue sertraline at 50 g daily.  No specific concerns today.  Continue to work on good sleep quality.

## 2022-01-25 NOTE — Assessment & Plan Note (Signed)
Continue with gabapentin 

## 2022-01-25 NOTE — Assessment & Plan Note (Signed)
Well on current regimen.

## 2022-01-29 DIAGNOSIS — M351 Other overlap syndromes: Secondary | ICD-10-CM | POA: Diagnosis not present

## 2022-01-29 DIAGNOSIS — Z791 Long term (current) use of non-steroidal anti-inflammatories (NSAID): Secondary | ICD-10-CM | POA: Diagnosis not present

## 2022-01-29 DIAGNOSIS — M797 Fibromyalgia: Secondary | ICD-10-CM | POA: Diagnosis not present

## 2022-02-01 ENCOUNTER — Other Ambulatory Visit (HOSPITAL_BASED_OUTPATIENT_CLINIC_OR_DEPARTMENT_OTHER): Payer: Self-pay

## 2022-02-01 MED ORDER — AMOXICILLIN 500 MG PO CAPS
1000.0000 mg | ORAL_CAPSULE | ORAL | 0 refills | Status: DC
  Filled 2022-02-01: qty 13, 4d supply, fill #0

## 2022-02-12 ENCOUNTER — Other Ambulatory Visit (HOSPITAL_BASED_OUTPATIENT_CLINIC_OR_DEPARTMENT_OTHER): Payer: Self-pay

## 2022-02-15 DIAGNOSIS — M47812 Spondylosis without myelopathy or radiculopathy, cervical region: Secondary | ICD-10-CM | POA: Diagnosis not present

## 2022-02-17 ENCOUNTER — Other Ambulatory Visit: Payer: Self-pay | Admitting: Family Medicine

## 2022-02-17 DIAGNOSIS — M5416 Radiculopathy, lumbar region: Secondary | ICD-10-CM

## 2022-02-17 DIAGNOSIS — M797 Fibromyalgia: Secondary | ICD-10-CM

## 2022-03-02 ENCOUNTER — Other Ambulatory Visit: Payer: Self-pay

## 2022-03-02 DIAGNOSIS — M810 Age-related osteoporosis without current pathological fracture: Secondary | ICD-10-CM

## 2022-03-02 NOTE — Progress Notes (Signed)
Ordered labs for prolia injection.

## 2022-03-05 ENCOUNTER — Other Ambulatory Visit: Payer: Self-pay | Admitting: Family Medicine

## 2022-03-12 ENCOUNTER — Telehealth: Payer: Self-pay

## 2022-03-12 NOTE — Telephone Encounter (Signed)
Initiated Prior authorization [id number] '500MG'$  tablets  Via: Covermymeds Case/Key:[id number] Status: approved  as of 03/12/22 Reason:Coverage Start Date:02/10/2022;Coverage End Date:03/12/2023; Notified Pt via: Mychart

## 2022-03-18 ENCOUNTER — Other Ambulatory Visit: Payer: Self-pay | Admitting: Family Medicine

## 2022-03-18 DIAGNOSIS — J3089 Other allergic rhinitis: Secondary | ICD-10-CM | POA: Diagnosis not present

## 2022-03-18 DIAGNOSIS — M797 Fibromyalgia: Secondary | ICD-10-CM

## 2022-03-18 DIAGNOSIS — M5416 Radiculopathy, lumbar region: Secondary | ICD-10-CM

## 2022-03-18 DIAGNOSIS — J3081 Allergic rhinitis due to animal (cat) (dog) hair and dander: Secondary | ICD-10-CM | POA: Diagnosis not present

## 2022-03-18 DIAGNOSIS — J301 Allergic rhinitis due to pollen: Secondary | ICD-10-CM | POA: Diagnosis not present

## 2022-04-01 DIAGNOSIS — J3081 Allergic rhinitis due to animal (cat) (dog) hair and dander: Secondary | ICD-10-CM | POA: Diagnosis not present

## 2022-04-01 DIAGNOSIS — J301 Allergic rhinitis due to pollen: Secondary | ICD-10-CM | POA: Diagnosis not present

## 2022-04-01 DIAGNOSIS — J3089 Other allergic rhinitis: Secondary | ICD-10-CM | POA: Diagnosis not present

## 2022-04-02 ENCOUNTER — Encounter: Payer: Self-pay | Admitting: Family Medicine

## 2022-04-02 ENCOUNTER — Other Ambulatory Visit: Payer: Self-pay | Admitting: Family Medicine

## 2022-04-02 DIAGNOSIS — N390 Urinary tract infection, site not specified: Secondary | ICD-10-CM

## 2022-04-02 DIAGNOSIS — Z1231 Encounter for screening mammogram for malignant neoplasm of breast: Secondary | ICD-10-CM

## 2022-04-06 MED ORDER — NITROFURANTOIN MONOHYD MACRO 100 MG PO CAPS: 100.0000 mg | ORAL_CAPSULE | Freq: Every day | ORAL | 1 refills | Status: DC

## 2022-04-06 MED ORDER — BUPROPION HCL ER (XL) 150 MG PO TB24: 150.0000 mg | ORAL_TABLET | Freq: Every day | ORAL | 1 refills | Status: DC

## 2022-04-06 NOTE — Addendum Note (Signed)
Addended by: Narda Rutherford on: 04/06/2022 09:17 AM   Modules accepted: Orders

## 2022-04-12 ENCOUNTER — Telehealth: Payer: Self-pay | Admitting: General Practice

## 2022-04-12 NOTE — Telephone Encounter (Signed)
Patient was scheduled for her medicare wellness appointment via telephone at 1pm.   Attempted to call patient x3. Left three voicemails.   Unable to get in touch with patient.

## 2022-04-14 ENCOUNTER — Ambulatory Visit: Payer: Medicare (Managed Care)

## 2022-04-15 DIAGNOSIS — J301 Allergic rhinitis due to pollen: Secondary | ICD-10-CM | POA: Diagnosis not present

## 2022-04-15 DIAGNOSIS — J3081 Allergic rhinitis due to animal (cat) (dog) hair and dander: Secondary | ICD-10-CM | POA: Diagnosis not present

## 2022-04-15 DIAGNOSIS — J3089 Other allergic rhinitis: Secondary | ICD-10-CM | POA: Diagnosis not present

## 2022-04-19 ENCOUNTER — Encounter: Payer: Self-pay | Admitting: General Practice

## 2022-04-19 ENCOUNTER — Ambulatory Visit (INDEPENDENT_AMBULATORY_CARE_PROVIDER_SITE_OTHER): Payer: Medicare (Managed Care) | Admitting: Family Medicine

## 2022-04-19 DIAGNOSIS — Z1211 Encounter for screening for malignant neoplasm of colon: Secondary | ICD-10-CM

## 2022-04-19 DIAGNOSIS — Z0000 Encounter for general adult medical examination without abnormal findings: Secondary | ICD-10-CM
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
## 2022-04-19 NOTE — Patient Instructions (Addendum)
[place] Maintenance Summary and Written Plan of Care  Ms. Robin Conrad ,  Thank you for allowing me to perform your Medicare Annual Wellness Visit and for your ongoing commitment to your health.   Health Maintenance & Immunization History Health Maintenance  Topic Date Due   COVID-19 Vaccine (6 - 2023-24 season) 05/05/2022 (Originally 03/16/2022)   Fecal DNA (Cologuard)  04/30/2022   Medicare Annual Wellness (AWV)  04/20/2023   MAMMOGRAM  05/14/2023   DTaP/Tdap/Td (3 - Td or Tdap) 04/15/2027   Pneumonia Vaccine 73+ Years old  Completed   INFLUENZA VACCINE  Completed   DEXA SCAN  Completed   Hepatitis C Screening  Completed   Zoster Vaccines- Shingrix  Completed   HPV VACCINES  Aged Out   Immunization History  Administered Date(s) Administered   COVID-19, mRNA, vaccine(Comirnaty)12 years and older 01/19/2022   Fluad Quad(high Dose 73+) 11/21/2018, 12/07/2019, 12/16/2020   Influenza Whole 11/25/2010   Influenza, High Dose Seasonal PF 12/22/2016, 01/19/2022   Influenza, Seasonal, Injecte, Preservative Fre 02/15/2012   Influenza,inj,Quad PF,6+ Mos 12/28/2012, 11/15/2013, 11/21/2014, 12/09/2015, 10/31/2017   Influenza-Unspecified 11/21/2018, 12/07/2019   Moderna Covid-19 Vaccine Bivalent Booster 56yr & up 12/16/2020   Moderna SARS-COV2 Booster Vaccination 07/23/2020   PFIZER(Purple Top)SARS-COV-2 Vaccination 04/14/2019, 05/15/2019, 01/03/2020   Pneumococcal Conjugate-13 05/21/2015   Pneumococcal Polysaccharide-23 12/08/2009, 06/24/2016   Respiratory Syncytial Virus Vaccine,Recomb Aduvanted(Arexvy) 12/21/2021   Tdap 03/16/2007, 04/14/2017   Zoster Recombinat (Shingrix) 11/16/2017, 04/07/2018   Zoster, Live 12/08/2009    These are the patient goals that we discussed:  Goals Addressed               This Visit's Progress     Patient Stated (pt-stated)        Patient stated that she would like to be able to walk more outside.          This is a  list of Health Maintenance Items that are overdue or due now: Colorectal cancer screening    Orders/Referrals Placed Today: Orders Placed This Encounter  Procedures   Cologuard   (Contact our referral department at [phone] you have not spoken with someone about your referral appointment within the next 5 days)    Follow-up Plan Follow-up with MHali Marry MD as planned Medicare wellness visit in one year. Patient will access AVS on my chart.      Health Maintenance, Female Adopting a healthy lifestyle and getting preventive care are important in promoting health and wellness. Ask your health care provider about: The right schedule for you to have regular tests and exams. Things you can do on your own to prevent diseases and keep yourself healthy. What should I know about diet, weight, and exercise? Eat a healthy diet  Eat a diet that includes plenty of vegetables, fruits, low-fat dairy products, and lean protein. Do not eat a lot of foods that are high in solid fats, added sugars, or sodium. Maintain a healthy weight Body mass index (BMI) is used to identify weight problems. It estimates body fat based on height and weight. Your health care provider can help determine your BMI and help you achieve or maintain a healthy weight. Get regular exercise Get regular exercise. This is one of the most important things you can do for your health. Most adults should: Exercise for at least 150 minutes each week. The exercise should increase your heart rate and make you sweat (moderate-intensity exercise). Do strengthening exercises at least twice a week.  This is in addition to the moderate-intensity exercise. Spend less time sitting. Even light physical activity can be beneficial. Watch cholesterol and blood lipids Have your blood tested for lipids and cholesterol at 73 years of age, then have this test every 5 years. Have your cholesterol levels checked more often  if: Your lipid or cholesterol levels are high. You are older than 73 years of age. You are at high risk for heart disease. What should I know about cancer screening? Depending on your health history and family history, you may need to have cancer screening at various ages. This may include screening for: Breast cancer. Cervical cancer. Colorectal cancer. Skin cancer. Lung cancer. What should I know about heart disease, diabetes, and high blood pressure? Blood pressure and heart disease High blood pressure causes heart disease and increases the risk of stroke. This is more likely to develop in people who have high blood pressure readings or are overweight. Have your blood pressure checked: Every 3-5 years if you are 65-79 years of age. Every year if you are 32 years old or older. Diabetes Have regular diabetes screenings. This checks your fasting blood sugar level. Have the screening done: Once every three years after age 69 if you are at a normal weight and have a low risk for diabetes. More often and at a younger age if you are overweight or have a high risk for diabetes. What should I know about preventing infection? Hepatitis B If you have a higher risk for hepatitis B, you should be screened for this virus. Talk with your health care provider to find out if you are at risk for hepatitis B infection. Hepatitis C Testing is recommended for: Everyone born from [birth year] through 1966. Anyone with known risk factors for hepatitis C. Sexually transmitted infections (STIs) Get screened for STIs, including gonorrhea and chlamydia, if: You are sexually active and are younger than 73 years of age. You are older than 73 years of age and your health care provider tells you that you are at risk for this type of infection. Your sexual activity has changed since you were last screened, and you are at increased risk for chlamydia or gonorrhea. Ask your health care provider if you are at risk. Ask  your health care provider about whether you are at high risk for HIV. Your health care provider may recommend a prescription medicine to help prevent HIV infection. If you choose to take medicine to prevent HIV, you should first get tested for HIV. You should then be tested every 3 months for as long as you are taking the medicine. Pregnancy If you are about to stop having your period (premenopausal) and you may become pregnant, seek counseling before you get pregnant. Take 400 to 800 micrograms (mcg) of folic acid every day if you become pregnant. Ask for birth control (contraception) if you want to prevent pregnancy. Osteoporosis and menopause Osteoporosis is a disease in which the bones lose minerals and strength with aging. This can result in bone fractures. If you are 38 years old or older, or if you are at risk for osteoporosis and fractures, ask your health care provider if you should: Be screened for bone loss. Take a calcium or vitamin D supplement to lower your risk of fractures. Be given hormone replacement therapy (HRT) to treat symptoms of menopause. Follow these instructions at home: Alcohol use Do not drink alcohol if: Your health care provider tells you not to drink. You are pregnant, may be pregnant,  or are planning to become pregnant. If you drink alcohol: Limit how much you have to: 0-1 drink a day. Know how much alcohol is in your drink. In the [place] one drink equals one 12 oz bottle of beer (355 mL), one 5 oz glass of wine (148 mL), or one 1 oz glass of hard liquor (44 mL). Lifestyle Do not use any products that contain nicotine or tobacco. These products include cigarettes, chewing tobacco, and vaping devices, such as e-cigarettes. If you need help quitting, ask your health care provider. Do not use street drugs. Do not share needles. Ask your health care provider for help if you need support or information about quitting drugs. General instructions Schedule regular  health, dental, and eye exams. Stay current with your vaccines. Tell your health care provider if: You often feel depressed. You have ever been abused or do not feel safe at home. Summary Adopting a healthy lifestyle and getting preventive care are important in promoting health and wellness. Follow your health care provider's instructions about healthy diet, exercising, and getting tested or screened for diseases. Follow your health care provider's instructions on monitoring your cholesterol and blood pressure. This information is not intended to replace advice given to you by your health care provider. Make sure you discuss any questions you have with your health care provider. Document Revised: 07/21/2020 Document Reviewed: 07/21/2020 Elsevier Patient Education  [place].

## 2022-04-20 DIAGNOSIS — B351 Tinea unguium: Secondary | ICD-10-CM | POA: Diagnosis not present

## 2022-04-21 ENCOUNTER — Other Ambulatory Visit: Payer: Self-pay | Admitting: Gastroenterology

## 2022-04-21 ENCOUNTER — Other Ambulatory Visit: Payer: Self-pay | Admitting: Family Medicine

## 2022-04-21 ENCOUNTER — Other Ambulatory Visit (HOSPITAL_BASED_OUTPATIENT_CLINIC_OR_DEPARTMENT_OTHER): Payer: Self-pay

## 2022-04-21 DIAGNOSIS — M797 Fibromyalgia: Secondary | ICD-10-CM

## 2022-04-21 DIAGNOSIS — M5416 Radiculopathy, lumbar region: Secondary | ICD-10-CM

## 2022-04-22 ENCOUNTER — Other Ambulatory Visit: Payer: Self-pay | Admitting: Family Medicine

## 2022-04-26 ENCOUNTER — Telehealth: Payer: Self-pay

## 2022-04-26 MED ORDER — OMEPRAZOLE 20 MG PO CPDR: 20.0000 mg | DELAYED_RELEASE_CAPSULE | Freq: Every day | ORAL | 1 refills | Status: DC

## 2022-04-26 NOTE — Telephone Encounter (Signed)
Yes, Ok to refill if she needs it.

## 2022-04-26 NOTE — Telephone Encounter (Signed)
Coty wanted to know if Dr Madilyn Fireman would take over prescribing Omeprazole 20 mg once daily.

## 2022-04-26 NOTE — Telephone Encounter (Signed)
Patient advised.

## 2022-04-28 DIAGNOSIS — J3089 Other allergic rhinitis: Secondary | ICD-10-CM | POA: Diagnosis not present

## 2022-04-28 DIAGNOSIS — J301 Allergic rhinitis due to pollen: Secondary | ICD-10-CM | POA: Diagnosis not present

## 2022-04-28 DIAGNOSIS — J3081 Allergic rhinitis due to animal (cat) (dog) hair and dander: Secondary | ICD-10-CM | POA: Diagnosis not present

## 2022-04-29 ENCOUNTER — Other Ambulatory Visit: Payer: Self-pay | Admitting: Family Medicine

## 2022-04-29 DIAGNOSIS — M797 Fibromyalgia: Secondary | ICD-10-CM

## 2022-04-29 DIAGNOSIS — M7541 Impingement syndrome of right shoulder: Secondary | ICD-10-CM

## 2022-05-10 ENCOUNTER — Ambulatory Visit: Payer: Medicare (Managed Care)

## 2022-05-10 DIAGNOSIS — Z1211 Encounter for screening for malignant neoplasm of colon: Secondary | ICD-10-CM | POA: Diagnosis not present

## 2022-05-10 DIAGNOSIS — Z1212 Encounter for screening for malignant neoplasm of rectum: Secondary | ICD-10-CM | POA: Diagnosis not present

## 2022-05-11 ENCOUNTER — Other Ambulatory Visit: Payer: Self-pay | Admitting: Family Medicine

## 2022-05-19 ENCOUNTER — Ambulatory Visit (INDEPENDENT_AMBULATORY_CARE_PROVIDER_SITE_OTHER): Payer: Medicare (Managed Care)

## 2022-05-19 DIAGNOSIS — Z1231 Encounter for screening mammogram for malignant neoplasm of breast: Secondary | ICD-10-CM

## 2022-05-19 LAB — COLOGUARD: COLOGUARD: NEGATIVE

## 2022-05-19 NOTE — Progress Notes (Signed)
Great news! Your Cologuard test is negative.  Recommend repeat colon cancer screening in 3 years.

## 2022-05-20 NOTE — Progress Notes (Signed)
Please call patient. Normal mammogram.  Repeat in 1 year.  

## 2022-05-25 DIAGNOSIS — J3089 Other allergic rhinitis: Secondary | ICD-10-CM | POA: Diagnosis not present

## 2022-05-25 DIAGNOSIS — J301 Allergic rhinitis due to pollen: Secondary | ICD-10-CM | POA: Diagnosis not present

## 2022-05-25 DIAGNOSIS — J3081 Allergic rhinitis due to animal (cat) (dog) hair and dander: Secondary | ICD-10-CM | POA: Diagnosis not present

## 2022-05-26 ENCOUNTER — Ambulatory Visit (INDEPENDENT_AMBULATORY_CARE_PROVIDER_SITE_OTHER): Payer: Medicare (Managed Care) | Admitting: Family Medicine

## 2022-05-26 ENCOUNTER — Other Ambulatory Visit: Payer: Self-pay | Admitting: Family Medicine

## 2022-05-26 ENCOUNTER — Encounter: Payer: Self-pay | Admitting: Family Medicine

## 2022-05-26 VITALS — BP 137/61 | HR 90 | Ht 64.0 in | Wt 126.0 lb

## 2022-05-26 DIAGNOSIS — M359 Systemic involvement of connective tissue, unspecified: Secondary | ICD-10-CM

## 2022-05-26 DIAGNOSIS — J3081 Allergic rhinitis due to animal (cat) (dog) hair and dander: Secondary | ICD-10-CM | POA: Diagnosis not present

## 2022-05-26 DIAGNOSIS — R21 Rash and other nonspecific skin eruption: Secondary | ICD-10-CM | POA: Diagnosis not present

## 2022-05-26 DIAGNOSIS — M797 Fibromyalgia: Secondary | ICD-10-CM

## 2022-05-26 DIAGNOSIS — Z13220 Encounter for screening for lipoid disorders: Secondary | ICD-10-CM

## 2022-05-26 DIAGNOSIS — Z23 Encounter for immunization: Secondary | ICD-10-CM | POA: Diagnosis not present

## 2022-05-26 DIAGNOSIS — M810 Age-related osteoporosis without current pathological fracture: Secondary | ICD-10-CM | POA: Diagnosis not present

## 2022-05-26 DIAGNOSIS — F411 Generalized anxiety disorder: Secondary | ICD-10-CM

## 2022-05-26 DIAGNOSIS — M5416 Radiculopathy, lumbar region: Secondary | ICD-10-CM

## 2022-05-26 DIAGNOSIS — J3089 Other allergic rhinitis: Secondary | ICD-10-CM | POA: Diagnosis not present

## 2022-05-26 DIAGNOSIS — J301 Allergic rhinitis due to pollen: Secondary | ICD-10-CM | POA: Diagnosis not present

## 2022-05-26 DIAGNOSIS — E039 Hypothyroidism, unspecified: Secondary | ICD-10-CM | POA: Diagnosis not present

## 2022-05-26 MED ORDER — MELOXICAM 15 MG PO TABS: 15.0000 mg | ORAL_TABLET | Freq: Every day | ORAL | 1 refills | Status: DC

## 2022-05-26 MED ORDER — FAMOTIDINE 20 MG PO TABS: ORAL_TABLET | ORAL | 1 refills | Status: DC

## 2022-05-26 MED ORDER — LEVOTHYROXINE SODIUM 50 MCG PO TABS: 50.0000 ug | ORAL_TABLET | Freq: Every day | ORAL | 1 refills | Status: DC

## 2022-05-26 MED ORDER — SERTRALINE HCL 50 MG PO TABS: 50.0000 mg | ORAL_TABLET | Freq: Every day | ORAL | 3 refills | Status: DC

## 2022-05-26 NOTE — Assessment & Plan Note (Signed)
New with sertraline.  Refills sent to pharmacy.

## 2022-05-26 NOTE — Progress Notes (Signed)
Established Patient Office Visit  Subjective   Patient ID: Robin Conrad, female    DOB: January 23, 1951  Age: 72 y.o. MRN: [id number]  Chief Complaint  Patient presents with   Fibromyalgia    HPI F/U fibromyalgia -she is stable on her current regimen she does need refills on her meloxicam today.  Hypothyroidism-also needs refills on levothyroxine.  No recent changes.  Last TSH was a year ago so we will get that updated.  She wanted to know about getting the updated COVID-vaccine she has not actually had COVID in the last 4 months.  He is still struggling with healing from recent oral surgery she keeps going back in and it does not seem to be healing they feel like it is probably a complication from the Prolia.  So they have recommended that she really increase her vitamin D, K, collagen B12 and magnesium intake to try to promote healing.  She also noted a little red erythematous rash on her right outer thigh that mostly like a perfect circle.  She says it now looks a little less red it still pink but the skin is peeling a little bit around the edges.  She says it was never raised or swollen and never blistered clear it.  She did see dermatology and she is back on terbinafine for nail fungus on her toenails and her fingernails.     ROS    Objective:     BP 137/61   Pulse 90   Ht '5\' 4"'$  (1.626 m)   Wt 126 lb (57.2 kg)   SpO2 97%   BMI 21.63 kg/m    Physical Exam Vitals and nursing note reviewed.  Constitutional:      Appearance: She is well-developed.  HENT:     Head: Normocephalic and atraumatic.  Cardiovascular:     Rate and Rhythm: Normal rate and regular rhythm.     Heart sounds: Normal heart sounds.  Pulmonary:     Effort: Pulmonary effort is normal.     Breath sounds: Normal breath sounds.  Skin:    General: Skin is warm and dry.     Comments: Does have a mildly erythematous circular rash on that right outer thigh just below where she had a skin graft  done.  There is a little bit of peeling skin right around the edge.  But it does not look like ringworm.  Neurological:     Mental Status: She is alert and oriented to person, place, and time.  Psychiatric:        Behavior: Behavior normal.      No results found for any visits on 05/26/22.    The 10-year ASCVD risk score (Arnett DK, et al., 2019) is: 13.8%    Assessment & Plan:   Problem List Items Addressed This Visit       Endocrine   Hypothyroid    Recheck levels.       Relevant Medications   levothyroxine (SYNTHROID) 50 MCG tablet   Other Relevant Orders   TSH   Lipid Panel w/reflex Direct LDL   COMPLETE METABOLIC PANEL WITH GFR     Musculoskeletal and Integument   Age-related osteoporosis without current pathological fracture    holding her Prolia since she had oral surgery.  Her last injection was 09/25/22        Other   GAD (generalized anxiety disorder)    New with sertraline.  Refills sent to pharmacy.      Relevant  Medications   sertraline (ZOLOFT) 50 MG tablet   Other Relevant Orders   TSH   Lipid Panel w/reflex Direct LDL   COMPLETE METABOLIC PANEL WITH GFR   Fibromyalgia - Primary    Stable.  Will refill NSAID.      Relevant Medications   meloxicam (MOBIC) 15 MG tablet   sertraline (ZOLOFT) 50 MG tablet   Other Relevant Orders   TSH   Lipid Panel w/reflex Direct LDL   COMPLETE METABOLIC PANEL WITH GFR   Connective tissue disease (HCC)    Continue with meloxicam.  And continue to monitor renal function twice a year since she is on chronic NSAID.      Other Visit Diagnoses     Screening, lipid       Relevant Orders   Lipid Panel w/reflex Direct LDL   Rash          Rash -It does look like it is healing so I do not recommend any further treatment at this time or workup.  But if it does not completely resolve or worsens please let us know.  Latest COVID-vaccine given today.  Return in about 6 months (around 11/26/2022) for Fibro and  Asthma .    Beatrice Lecher, MD

## 2022-05-26 NOTE — Assessment & Plan Note (Signed)
Recheck levels 

## 2022-05-26 NOTE — Addendum Note (Signed)
Addended by: Rae Lips on: 05/26/2022 10:45 AM   Modules accepted: Orders

## 2022-05-26 NOTE — Assessment & Plan Note (Signed)
Continue with meloxicam.  And continue to monitor renal function twice a year since she is on chronic NSAID.

## 2022-05-26 NOTE — Assessment & Plan Note (Signed)
holding her Prolia since she had oral surgery.  Her last injection was 09/25/22

## 2022-05-26 NOTE — Assessment & Plan Note (Signed)
Stable.  Will refill NSAID.

## 2022-05-27 LAB — COMPLETE METABOLIC PANEL WITH GFR
AG Ratio: 1.9 (calc) (ref 1.0–2.5)
ALT: 21 U/L (ref 6–29)
AST: 26 U/L (ref 10–35)
Albumin: 4.5 g/dL (ref 3.6–5.1)
Alkaline phosphatase (APISO): 99 U/L (ref 37–153)
BUN/Creatinine Ratio: 25 (calc) — ABNORMAL HIGH (ref 6–22)
BUN: 26 mg/dL — ABNORMAL HIGH (ref 7–25)
CO2: 31 mmol/L (ref 20–32)
Calcium: 10.3 mg/dL (ref 8.6–10.4)
Chloride: 101 mmol/L (ref 98–110)
Creat: 1.04 mg/dL — ABNORMAL HIGH (ref 0.60–1.00)
Globulin: 2.4 g/dL (calc) (ref 1.9–3.7)
Glucose, Bld: 74 mg/dL (ref 65–99)
Potassium: 4.8 mmol/L (ref 3.5–5.3)
Sodium: 141 mmol/L (ref 135–146)
Total Bilirubin: 0.4 mg/dL (ref 0.2–1.2)
Total Protein: 6.9 g/dL (ref 6.1–8.1)
eGFR: 57 mL/min/{1.73_m2} — ABNORMAL LOW (ref >=60)

## 2022-05-27 LAB — LIPID PANEL W/REFLEX DIRECT LDL
Cholesterol: 177 mg/dL (ref <200)
HDL: 80 mg/dL (ref >=50)
LDL Cholesterol (Calc): 77 mg/dL (calc)
Non-HDL Cholesterol (Calc): 97 mg/dL (calc) (ref <130)
Total CHOL/HDL Ratio: 2.2 (calc) (ref <5.0)
Triglycerides: 110 mg/dL (ref <150)

## 2022-05-27 LAB — TSH: TSH: 2.88 mIU/L (ref 0.40–4.50)

## 2022-05-27 NOTE — Progress Notes (Signed)
Hi Sha, the kidney function is up slightly, but it is because you look dry on your blood work.  Otherwise no concerning findings.  Just make sure hydrating well.  Your TSH looks good.  Cholesterol also looks good.

## 2022-06-08 ENCOUNTER — Telehealth: Payer: Self-pay | Admitting: *Deleted

## 2022-06-08 NOTE — Telephone Encounter (Signed)
Pt called and lvm stating that she was outside with her grandkids and she fell on her knee. She states that she wasn't able to get up on her own she was extremely wobbly. She stated that her neighbors came out and helped her to get up. She also said that she has noticed that for some time her legs have been doing some strange things. She wanted to make Dr. Madilyn Fireman aware. She said that she is ok.    I called pt back and the phone rang a couple of times and then hung up.

## 2022-06-09 ENCOUNTER — Other Ambulatory Visit: Payer: Self-pay | Admitting: Family Medicine

## 2022-06-09 NOTE — Telephone Encounter (Signed)
Spoke w/pt she informed me that she has felt more lightheaded over this past week.   She questions if this is an effect from her medications and or the vitamin supplements that the dentist started her on.   Pt advised to check her bp's and send those readings over.   Will fwd to pcp for any advice.

## 2022-06-14 DIAGNOSIS — J301 Allergic rhinitis due to pollen: Secondary | ICD-10-CM | POA: Diagnosis not present

## 2022-06-14 DIAGNOSIS — J3081 Allergic rhinitis due to animal (cat) (dog) hair and dander: Secondary | ICD-10-CM | POA: Diagnosis not present

## 2022-06-14 DIAGNOSIS — J3089 Other allergic rhinitis: Secondary | ICD-10-CM | POA: Diagnosis not present

## 2022-07-05 DIAGNOSIS — J3089 Other allergic rhinitis: Secondary | ICD-10-CM | POA: Diagnosis not present

## 2022-07-05 DIAGNOSIS — J3081 Allergic rhinitis due to animal (cat) (dog) hair and dander: Secondary | ICD-10-CM | POA: Diagnosis not present

## 2022-07-05 DIAGNOSIS — J301 Allergic rhinitis due to pollen: Secondary | ICD-10-CM | POA: Diagnosis not present

## 2022-07-12 ENCOUNTER — Other Ambulatory Visit: Payer: Self-pay | Admitting: Family Medicine

## 2022-07-12 DIAGNOSIS — M5416 Radiculopathy, lumbar region: Secondary | ICD-10-CM

## 2022-07-12 DIAGNOSIS — M797 Fibromyalgia: Secondary | ICD-10-CM

## 2022-07-16 DIAGNOSIS — Z791 Long term (current) use of non-steroidal anti-inflammatories (NSAID): Secondary | ICD-10-CM | POA: Diagnosis not present

## 2022-07-16 DIAGNOSIS — M797 Fibromyalgia: Secondary | ICD-10-CM | POA: Diagnosis not present

## 2022-07-16 DIAGNOSIS — M351 Other overlap syndromes: Secondary | ICD-10-CM | POA: Diagnosis not present

## 2022-07-19 DIAGNOSIS — J3089 Other allergic rhinitis: Secondary | ICD-10-CM | POA: Diagnosis not present

## 2022-07-19 DIAGNOSIS — J301 Allergic rhinitis due to pollen: Secondary | ICD-10-CM | POA: Diagnosis not present

## 2022-07-19 DIAGNOSIS — J3081 Allergic rhinitis due to animal (cat) (dog) hair and dander: Secondary | ICD-10-CM | POA: Diagnosis not present

## 2022-07-20 DIAGNOSIS — L649 Androgenic alopecia, unspecified: Secondary | ICD-10-CM | POA: Diagnosis not present

## 2022-07-20 DIAGNOSIS — B351 Tinea unguium: Secondary | ICD-10-CM | POA: Diagnosis not present

## 2022-08-02 DIAGNOSIS — J3089 Other allergic rhinitis: Secondary | ICD-10-CM | POA: Diagnosis not present

## 2022-08-02 DIAGNOSIS — Z01419 Encounter for gynecological examination (general) (routine) without abnormal findings: Secondary | ICD-10-CM | POA: Diagnosis not present

## 2022-08-02 DIAGNOSIS — J3081 Allergic rhinitis due to animal (cat) (dog) hair and dander: Secondary | ICD-10-CM | POA: Diagnosis not present

## 2022-08-02 DIAGNOSIS — J301 Allergic rhinitis due to pollen: Secondary | ICD-10-CM | POA: Diagnosis not present

## 2022-08-05 ENCOUNTER — Other Ambulatory Visit: Payer: Self-pay | Admitting: Family Medicine

## 2022-08-05 DIAGNOSIS — M7541 Impingement syndrome of right shoulder: Secondary | ICD-10-CM

## 2022-08-05 DIAGNOSIS — M797 Fibromyalgia: Secondary | ICD-10-CM

## 2022-08-13 DIAGNOSIS — G894 Chronic pain syndrome: Secondary | ICD-10-CM | POA: Diagnosis not present

## 2022-08-13 DIAGNOSIS — Z79890 Hormone replacement therapy (postmenopausal): Secondary | ICD-10-CM | POA: Diagnosis not present

## 2022-08-13 DIAGNOSIS — M47812 Spondylosis without myelopathy or radiculopathy, cervical region: Secondary | ICD-10-CM | POA: Diagnosis not present

## 2022-08-13 DIAGNOSIS — Z91040 Latex allergy status: Secondary | ICD-10-CM | POA: Diagnosis not present

## 2022-08-13 DIAGNOSIS — Z79899 Other long term (current) drug therapy: Secondary | ICD-10-CM | POA: Diagnosis not present

## 2022-08-13 DIAGNOSIS — Z885 Allergy status to narcotic agent status: Secondary | ICD-10-CM | POA: Diagnosis not present

## 2022-08-13 DIAGNOSIS — Z888 Allergy status to other drugs, medicaments and biological substances status: Secondary | ICD-10-CM | POA: Diagnosis not present

## 2022-08-13 DIAGNOSIS — Z7983 Long term (current) use of bisphosphonates: Secondary | ICD-10-CM | POA: Diagnosis not present

## 2022-08-14 ENCOUNTER — Other Ambulatory Visit: Payer: Self-pay | Admitting: Family Medicine

## 2022-08-23 ENCOUNTER — Other Ambulatory Visit: Payer: Self-pay | Admitting: Family Medicine

## 2022-08-24 DIAGNOSIS — J3081 Allergic rhinitis due to animal (cat) (dog) hair and dander: Secondary | ICD-10-CM | POA: Diagnosis not present

## 2022-08-24 DIAGNOSIS — J3089 Other allergic rhinitis: Secondary | ICD-10-CM | POA: Diagnosis not present

## 2022-08-24 DIAGNOSIS — J301 Allergic rhinitis due to pollen: Secondary | ICD-10-CM | POA: Diagnosis not present

## 2022-08-25 ENCOUNTER — Telehealth: Payer: Self-pay | Admitting: Family Medicine

## 2022-08-25 MED ORDER — GABAPENTIN 600 MG PO TABS: 1200.0000 mg | ORAL_TABLET | Freq: Two times a day (BID) | ORAL | 3 refills | Status: DC

## 2022-08-25 NOTE — Telephone Encounter (Signed)
This is not controlled, ya'll are able to sign off on it.  I'll send it now.  She has seen her PCP but not me in over a year so frankly I think metheney can take over.

## 2022-08-25 NOTE — Telephone Encounter (Signed)
Patient called requesting a refill of gabapentin (NEURONTIN) 600 MG tablet [[id number]]   Patient has been out of this medication for 3 days.  Pharmacy -  [facility] - [place], [place] - [address] [place] [address]

## 2022-08-31 ENCOUNTER — Other Ambulatory Visit: Payer: Self-pay | Admitting: Family Medicine

## 2022-09-02 DIAGNOSIS — J301 Allergic rhinitis due to pollen: Secondary | ICD-10-CM | POA: Diagnosis not present

## 2022-09-02 DIAGNOSIS — J3089 Other allergic rhinitis: Secondary | ICD-10-CM | POA: Diagnosis not present

## 2022-09-02 DIAGNOSIS — J3081 Allergic rhinitis due to animal (cat) (dog) hair and dander: Secondary | ICD-10-CM | POA: Diagnosis not present

## 2022-09-06 DIAGNOSIS — Z885 Allergy status to narcotic agent status: Secondary | ICD-10-CM | POA: Diagnosis not present

## 2022-09-06 DIAGNOSIS — Z79890 Hormone replacement therapy (postmenopausal): Secondary | ICD-10-CM | POA: Diagnosis not present

## 2022-09-06 DIAGNOSIS — M797 Fibromyalgia: Secondary | ICD-10-CM | POA: Diagnosis not present

## 2022-09-06 DIAGNOSIS — M47812 Spondylosis without myelopathy or radiculopathy, cervical region: Secondary | ICD-10-CM | POA: Diagnosis not present

## 2022-09-06 DIAGNOSIS — Z79899 Other long term (current) drug therapy: Secondary | ICD-10-CM | POA: Diagnosis not present

## 2022-09-06 DIAGNOSIS — Z91040 Latex allergy status: Secondary | ICD-10-CM | POA: Diagnosis not present

## 2022-09-06 DIAGNOSIS — Z981 Arthrodesis status: Secondary | ICD-10-CM | POA: Diagnosis not present

## 2022-09-06 DIAGNOSIS — M5136 Other intervertebral disc degeneration, lumbar region: Secondary | ICD-10-CM | POA: Diagnosis not present

## 2022-09-06 DIAGNOSIS — Z791 Long term (current) use of non-steroidal anti-inflammatories (NSAID): Secondary | ICD-10-CM | POA: Diagnosis not present

## 2022-09-06 DIAGNOSIS — Z888 Allergy status to other drugs, medicaments and biological substances status: Secondary | ICD-10-CM | POA: Diagnosis not present

## 2022-09-06 DIAGNOSIS — M48061 Spinal stenosis, lumbar region without neurogenic claudication: Secondary | ICD-10-CM | POA: Diagnosis not present

## 2022-09-08 ENCOUNTER — Ambulatory Visit: Payer: Medicare (Managed Care) | Admitting: Family Medicine

## 2022-09-15 DIAGNOSIS — J3081 Allergic rhinitis due to animal (cat) (dog) hair and dander: Secondary | ICD-10-CM | POA: Diagnosis not present

## 2022-09-15 DIAGNOSIS — J301 Allergic rhinitis due to pollen: Secondary | ICD-10-CM | POA: Diagnosis not present

## 2022-09-15 DIAGNOSIS — J3089 Other allergic rhinitis: Secondary | ICD-10-CM | POA: Diagnosis not present

## 2022-09-17 ENCOUNTER — Other Ambulatory Visit: Payer: Self-pay | Admitting: Family Medicine

## 2022-09-17 DIAGNOSIS — M5416 Radiculopathy, lumbar region: Secondary | ICD-10-CM

## 2022-09-17 DIAGNOSIS — M797 Fibromyalgia: Secondary | ICD-10-CM

## 2022-09-24 DIAGNOSIS — M5030 Other cervical disc degeneration, unspecified cervical region: Secondary | ICD-10-CM | POA: Diagnosis not present

## 2022-09-24 DIAGNOSIS — G894 Chronic pain syndrome: Secondary | ICD-10-CM | POA: Diagnosis not present

## 2022-09-24 DIAGNOSIS — M47812 Spondylosis without myelopathy or radiculopathy, cervical region: Secondary | ICD-10-CM | POA: Diagnosis not present

## 2022-09-27 NOTE — Progress Notes (Deleted)
   Established Patient Office Visit  Subjective   Patient ID: Robin Conrad, female    DOB: 1951/02/10  Age: 72 y.o. MRN: [id number]  No chief complaint on file.   HPI  F/U Fibro -   F/U Asthma -   {History (Optional):23778}  ROS    Objective:     There were no vitals taken for this visit. {Vitals History (Optional):23777}  Physical Exam   No results found for any visits on 09/28/22.  {Labs (Optional):23779}  The 10-year ASCVD risk score (Arnett DK, et al., 2019) is: 9.4%    Assessment & Plan:   Problem List Items Addressed This Visit       Respiratory   Moderate persistent asthma without complication     Other   Fibromyalgia - Primary    No follow-ups on file.    Nani Gasser, MD

## 2022-09-28 ENCOUNTER — Encounter: Payer: Self-pay | Admitting: Family Medicine

## 2022-09-28 ENCOUNTER — Ambulatory Visit (INDEPENDENT_AMBULATORY_CARE_PROVIDER_SITE_OTHER): Payer: Medicare (Managed Care) | Admitting: Family Medicine

## 2022-09-28 ENCOUNTER — Ambulatory Visit: Payer: Medicare (Managed Care) | Admitting: Family Medicine

## 2022-09-28 VITALS — BP 124/62 | HR 80 | Ht 64.0 in | Wt 122.0 lb

## 2022-09-28 DIAGNOSIS — D692 Other nonthrombocytopenic purpura: Secondary | ICD-10-CM

## 2022-09-28 DIAGNOSIS — M797 Fibromyalgia: Secondary | ICD-10-CM | POA: Diagnosis not present

## 2022-09-28 DIAGNOSIS — F411 Generalized anxiety disorder: Secondary | ICD-10-CM | POA: Diagnosis not present

## 2022-09-28 DIAGNOSIS — J4540 Moderate persistent asthma, uncomplicated: Secondary | ICD-10-CM

## 2022-09-28 DIAGNOSIS — M7541 Impingement syndrome of right shoulder: Secondary | ICD-10-CM

## 2022-09-28 DIAGNOSIS — Z23 Encounter for immunization: Secondary | ICD-10-CM | POA: Diagnosis not present

## 2022-09-28 DIAGNOSIS — M5416 Radiculopathy, lumbar region: Secondary | ICD-10-CM | POA: Diagnosis not present

## 2022-09-28 MED ORDER — SERTRALINE HCL 50 MG PO TABS
50.0000 mg | ORAL_TABLET | Freq: Every day | ORAL | 3 refills | Status: DC
Start: 2022-09-28 — End: 2023-11-22

## 2022-09-28 MED ORDER — METHOCARBAMOL 500 MG PO TABS
ORAL_TABLET | ORAL | 1 refills | Status: DC
Start: 2022-09-28 — End: 2023-04-08

## 2022-09-28 NOTE — Assessment & Plan Note (Signed)
Continue pain management with tramadol, gabapentin, and NSAID as needed.  Also uses methocarbamol and has been having to take an extra half a tab during the day mostly for her neck which is related to cervical radiculopathy more so than her fibromyalgia.

## 2022-09-28 NOTE — Assessment & Plan Note (Signed)
Stable chronic condition 

## 2022-09-28 NOTE — Progress Notes (Signed)
Established Patient Office Visit  Subjective   Patient ID: Robin Conrad, female    DOB: [birth year]  Age: 72 y.o. MRN: [id number]  Chief Complaint  Patient presents with   Fibromyalgia    HPI  F/u fibromyalgia -she is really been struggling more with her pain in her neck.  She did an ablation on the right and left sides and unfortunately really has not gotten her a lot of relief.  She has had to use some extra tramadol over the last 6 months as well as taking an extra half a tab of methocarbamol during the day.  She just filled her prescription for the tramadol on July 5.  With Dr. Laurian Brim, pain management they are looking at seeing if they can get a compounded topical cream that would be helpful for her and if not then maybe even looking at a stimulator.  Want to let me know that she has been eating a more heart healthy diet.  Her husband was found to have some minor blockages.  She feels like most of her life she has eaten fairly healthy but they have been trying to eat more of a Mediterranean diet she is trying to stay as active as she can      05/26/2022   10:10 AM 04/19/2022    1:10 PM 01/25/2022   10:48 AM  PHQ9 SCORE ONLY  PHQ-9 Total Score 2 0 4      ROS    Objective:     BP 124/62   Pulse 80   Ht 5\' 4"  (1.626 m)   Wt 122 lb (55.3 kg)   SpO2 96%   BMI 20.94 kg/m    Physical Exam Vitals and nursing note reviewed.  Constitutional:      Appearance: She is well-developed.  HENT:     Head: Normocephalic and atraumatic.  Cardiovascular:     Rate and Rhythm: Normal rate and regular rhythm.     Heart sounds: Normal heart sounds.  Pulmonary:     Effort: Pulmonary effort is normal.     Breath sounds: Normal breath sounds.  Skin:    General: Skin is warm and dry.  Neurological:     Mental Status: She is alert and oriented to person, place, and time.  Psychiatric:        Behavior: Behavior normal.      No results found for any visits on  09/28/22.    The 10-year ASCVD risk score (Arnett DK, et al., 2019) is: 11.8%    Assessment & Plan:   Problem List Items Addressed This Visit       Cardiovascular and Mediastinum   Senile purpura (HCC)    Stable chronic condition        Nervous and Auditory   Left lumbar radiculitis   Relevant Medications   methocarbamol (ROBAXIN) 500 MG tablet   sertraline (ZOLOFT) 50 MG tablet     Musculoskeletal and Integument   Impingement syndrome, shoulder, right   Relevant Medications   methocarbamol (ROBAXIN) 500 MG tablet     Other   GAD (generalized anxiety disorder)    Happy with current regimen of sertraline.  Due for refills.  The past 6 weeks has definitely been more stressful and dealing with her pain.  She has noticed she is just been more short tempered and irritable.  Her husband has been helpful though around the house which is great.      Relevant Medications   sertraline (ZOLOFT) 50  MG tablet   Fibromyalgia - Primary    Continue pain management with tramadol, gabapentin, and NSAID as needed.  Also uses methocarbamol and has been having to take an extra half a tab during the day mostly for her neck which is related to cervical radiculopathy more so than her fibromyalgia.      Relevant Medications   methocarbamol (ROBAXIN) 500 MG tablet   sertraline (ZOLOFT) 50 MG tablet   Other Visit Diagnoses     Encounter for immunization          I spent 30 minutes on the day of the encounter to include pre-visit record review, face-to-face time with the patient and post visit ordering of test.  No follow-ups on file.    Nani Gasser, MD

## 2022-09-28 NOTE — Assessment & Plan Note (Signed)
Happy with current regimen of sertraline.  Due for refills.  The past 6 weeks has definitely been more stressful and dealing with her pain.  She has noticed she is just been more short tempered and irritable.  Her husband has been helpful though around the house which is great.

## 2022-09-29 DIAGNOSIS — J3081 Allergic rhinitis due to animal (cat) (dog) hair and dander: Secondary | ICD-10-CM | POA: Diagnosis not present

## 2022-09-29 DIAGNOSIS — J3089 Other allergic rhinitis: Secondary | ICD-10-CM | POA: Diagnosis not present

## 2022-09-29 DIAGNOSIS — J301 Allergic rhinitis due to pollen: Secondary | ICD-10-CM | POA: Diagnosis not present

## 2022-09-30 ENCOUNTER — Other Ambulatory Visit: Payer: Self-pay | Admitting: Family Medicine

## 2022-09-30 DIAGNOSIS — N390 Urinary tract infection, site not specified: Secondary | ICD-10-CM

## 2022-10-04 ENCOUNTER — Encounter: Payer: Self-pay | Admitting: Family Medicine

## 2022-10-04 DIAGNOSIS — M797 Fibromyalgia: Secondary | ICD-10-CM

## 2022-10-04 DIAGNOSIS — M5416 Radiculopathy, lumbar region: Secondary | ICD-10-CM

## 2022-10-05 MED ORDER — TRAMADOL HCL 50 MG PO TABS
ORAL_TABLET | ORAL | 1 refills | Status: DC
Start: 2022-10-05 — End: 2022-12-07

## 2022-10-12 DIAGNOSIS — J301 Allergic rhinitis due to pollen: Secondary | ICD-10-CM | POA: Diagnosis not present

## 2022-10-12 DIAGNOSIS — J3089 Other allergic rhinitis: Secondary | ICD-10-CM | POA: Diagnosis not present

## 2022-10-12 DIAGNOSIS — J3081 Allergic rhinitis due to animal (cat) (dog) hair and dander: Secondary | ICD-10-CM | POA: Diagnosis not present

## 2022-10-15 DIAGNOSIS — Z791 Long term (current) use of non-steroidal anti-inflammatories (NSAID): Secondary | ICD-10-CM | POA: Diagnosis not present

## 2022-10-15 DIAGNOSIS — M351 Other overlap syndromes: Secondary | ICD-10-CM | POA: Diagnosis not present

## 2022-10-15 DIAGNOSIS — M797 Fibromyalgia: Secondary | ICD-10-CM | POA: Diagnosis not present

## 2022-10-25 DIAGNOSIS — M961 Postlaminectomy syndrome, not elsewhere classified: Secondary | ICD-10-CM | POA: Diagnosis not present

## 2022-10-25 DIAGNOSIS — M47812 Spondylosis without myelopathy or radiculopathy, cervical region: Secondary | ICD-10-CM | POA: Diagnosis not present

## 2022-10-26 DIAGNOSIS — J3089 Other allergic rhinitis: Secondary | ICD-10-CM | POA: Diagnosis not present

## 2022-10-26 DIAGNOSIS — J3081 Allergic rhinitis due to animal (cat) (dog) hair and dander: Secondary | ICD-10-CM | POA: Diagnosis not present

## 2022-10-26 DIAGNOSIS — J301 Allergic rhinitis due to pollen: Secondary | ICD-10-CM | POA: Diagnosis not present

## 2022-10-27 ENCOUNTER — Ambulatory Visit (INDEPENDENT_AMBULATORY_CARE_PROVIDER_SITE_OTHER): Payer: Medicare (Managed Care) | Admitting: Family Medicine

## 2022-10-27 ENCOUNTER — Encounter: Payer: Self-pay | Admitting: Family Medicine

## 2022-10-27 VITALS — BP 145/70 | HR 87 | Ht 64.0 in | Wt 123.0 lb

## 2022-10-27 DIAGNOSIS — R42 Dizziness and giddiness: Secondary | ICD-10-CM | POA: Diagnosis not present

## 2022-10-27 DIAGNOSIS — I951 Orthostatic hypotension: Secondary | ICD-10-CM | POA: Insufficient documentation

## 2022-10-27 NOTE — Assessment & Plan Note (Addendum)
Patient is presenting with acute dizziness.  She has a history of dizziness.  On physical exam patient had positive orthostasis which could be due to dehydration but this is definitely a reason she could be dizzy.  In addition, patient had bilateral cerumen impaction.  She does wear hearing aids and I do believe the hearing aids are pushing the wax against the tympanic membrane.  I am wondering if this is affecting her in her ear and causing her to have dizziness and loss of balance.  I did mention to patient to use over-the-counter Debrox to see if we can get some of the earwax out. - POC urine ordered

## 2022-10-27 NOTE — Progress Notes (Signed)
Acute Office Visit  Subjective:     Patient ID: Robin Conrad, female    DOB: [birth year], 72 y.o.   MRN: [id number]  Chief Complaint  Patient presents with   Dizziness    Dizziness Pertinent negatives include no chest pain, chills, coughing, fever or headaches.   Patient is in today for acute visit of dizziness.  She says the last couple days she has been feeling dizzy and losing her balance.  She denies any loss of consciousness.  She said the last time this happened she had a UTI however she is on prophylactic Macrobid.  She does admit to not drinking enough throughout the day.  Review of Systems  Constitutional:  Negative for chills and fever.  Respiratory:  Negative for cough and shortness of breath.   Cardiovascular:  Negative for chest pain.  Neurological:  Positive for dizziness. Negative for headaches.        Objective:    BP (!) 145/70 (BP Location: Left Arm)   Pulse 87   Ht 5\' 4"  (1.626 m)   Wt 123 lb (55.8 kg)   SpO2 96%   BMI 21.11 kg/m    Physical Exam Vitals and nursing note reviewed.  Constitutional:      General: She is not in acute distress.    Appearance: Normal appearance.  HENT:     Head: Normocephalic and atraumatic.     Right Ear: External ear normal. There is impacted cerumen.     Left Ear: External ear normal. There is impacted cerumen.     Nose: Nose normal.  Eyes:     Conjunctiva/sclera: Conjunctivae normal.  Cardiovascular:     Rate and Rhythm: Normal rate and regular rhythm.  Pulmonary:     Effort: Pulmonary effort is normal.     Breath sounds: Normal breath sounds.  Neurological:     General: No focal deficit present.     Mental Status: She is alert and oriented to person, place, and time.  Psychiatric:        Mood and Affect: Mood normal.        Behavior: Behavior normal.        Thought Content: Thought content normal.        Judgment: Judgment normal.     No results found for any visits on 10/27/22.       Assessment & Plan:   Problem List Items Addressed This Visit       Cardiovascular and Mediastinum   Orthostatic hypotension - Primary    Orthostatics taken today and patient is positive.  There are many causes for this however I do believe the most likely is dehydration.  I have encouraged patient to increase fluid intake.  I am also having her bring back a urine sample since she was unable to void in clinic today.  If symptoms do not resolve patient can follow-up in 1 week with PCP.         Other   Dizziness    Patient is presenting with acute dizziness.  She has a history of dizziness.  On physical exam patient had positive orthostasis which could be due to dehydration but this is definitely a reason she could be dizzy.  In addition, patient had bilateral cerumen impaction.  She does wear hearing aids and I do believe the hearing aids are pushing the wax against the tympanic membrane.  I am wondering if this is affecting her in her ear and causing her to have dizziness  and loss of balance.  I did mention to patient to use over-the-counter Debrox to see if we can get some of the earwax out. - POC urine ordered       Relevant Orders   Urinalysis, Routine w reflex microscopic   25 minutes spent with patient in room and in addition to prepping  No orders of the defined types were placed in this encounter.   Return if symptoms worsen or fail to improve.  Charlton Amor, DO

## 2022-10-27 NOTE — Assessment & Plan Note (Signed)
Orthostatics taken today and patient is positive.  There are many causes for this however I do believe the most likely is dehydration.  I have encouraged patient to increase fluid intake.  I am also having her bring back a urine sample since she was unable to void in clinic today.  If symptoms do not resolve patient can follow-up in 1 week with PCP.

## 2022-10-28 ENCOUNTER — Other Ambulatory Visit: Payer: Self-pay | Admitting: Family Medicine

## 2022-10-28 DIAGNOSIS — R42 Dizziness and giddiness: Secondary | ICD-10-CM | POA: Diagnosis not present

## 2022-10-29 LAB — URINALYSIS, ROUTINE W REFLEX MICROSCOPIC
Bilirubin, UA: NEGATIVE
Glucose, UA: NEGATIVE
Ketones, UA: NEGATIVE
Leukocytes,UA: NEGATIVE
Nitrite, UA: NEGATIVE
Protein,UA: NEGATIVE
RBC, UA: NEGATIVE
Specific Gravity, UA: 1.012 (ref 1.005–1.030)
Urobilinogen, Ur: 0.2 mg/dL (ref 0.2–1.0)
pH, UA: 7.5 (ref 5.0–7.5)

## 2022-11-03 DIAGNOSIS — M47813 Spondylosis without myelopathy or radiculopathy, cervicothoracic region: Secondary | ICD-10-CM | POA: Diagnosis not present

## 2022-11-03 DIAGNOSIS — M5033 Other cervical disc degeneration, cervicothoracic region: Secondary | ICD-10-CM | POA: Diagnosis not present

## 2022-11-03 DIAGNOSIS — M47812 Spondylosis without myelopathy or radiculopathy, cervical region: Secondary | ICD-10-CM | POA: Diagnosis not present

## 2022-11-03 DIAGNOSIS — M5031 Other cervical disc degeneration,  high cervical region: Secondary | ICD-10-CM | POA: Diagnosis not present

## 2022-11-11 DIAGNOSIS — J3081 Allergic rhinitis due to animal (cat) (dog) hair and dander: Secondary | ICD-10-CM | POA: Diagnosis not present

## 2022-11-11 DIAGNOSIS — J301 Allergic rhinitis due to pollen: Secondary | ICD-10-CM | POA: Diagnosis not present

## 2022-11-11 DIAGNOSIS — J3089 Other allergic rhinitis: Secondary | ICD-10-CM | POA: Diagnosis not present

## 2022-11-20 ENCOUNTER — Other Ambulatory Visit: Payer: Self-pay | Admitting: Family Medicine

## 2022-11-20 DIAGNOSIS — M797 Fibromyalgia: Secondary | ICD-10-CM

## 2022-11-22 ENCOUNTER — Other Ambulatory Visit: Payer: Self-pay | Admitting: Family Medicine

## 2022-11-22 DIAGNOSIS — E039 Hypothyroidism, unspecified: Secondary | ICD-10-CM

## 2022-11-25 DIAGNOSIS — J301 Allergic rhinitis due to pollen: Secondary | ICD-10-CM | POA: Diagnosis not present

## 2022-11-25 DIAGNOSIS — J3081 Allergic rhinitis due to animal (cat) (dog) hair and dander: Secondary | ICD-10-CM | POA: Diagnosis not present

## 2022-11-25 DIAGNOSIS — J3089 Other allergic rhinitis: Secondary | ICD-10-CM | POA: Diagnosis not present

## 2022-11-30 ENCOUNTER — Other Ambulatory Visit (HOSPITAL_BASED_OUTPATIENT_CLINIC_OR_DEPARTMENT_OTHER): Payer: Self-pay

## 2022-12-06 ENCOUNTER — Other Ambulatory Visit: Payer: Self-pay | Admitting: Family Medicine

## 2022-12-06 DIAGNOSIS — M5416 Radiculopathy, lumbar region: Secondary | ICD-10-CM

## 2022-12-06 DIAGNOSIS — M797 Fibromyalgia: Secondary | ICD-10-CM

## 2022-12-07 ENCOUNTER — Other Ambulatory Visit: Payer: Self-pay | Admitting: Family Medicine

## 2022-12-09 DIAGNOSIS — J3089 Other allergic rhinitis: Secondary | ICD-10-CM | POA: Diagnosis not present

## 2022-12-09 DIAGNOSIS — J301 Allergic rhinitis due to pollen: Secondary | ICD-10-CM | POA: Diagnosis not present

## 2022-12-09 DIAGNOSIS — J3081 Allergic rhinitis due to animal (cat) (dog) hair and dander: Secondary | ICD-10-CM | POA: Diagnosis not present

## 2022-12-23 DIAGNOSIS — J3089 Other allergic rhinitis: Secondary | ICD-10-CM | POA: Diagnosis not present

## 2022-12-23 DIAGNOSIS — J301 Allergic rhinitis due to pollen: Secondary | ICD-10-CM | POA: Diagnosis not present

## 2022-12-23 DIAGNOSIS — J3081 Allergic rhinitis due to animal (cat) (dog) hair and dander: Secondary | ICD-10-CM | POA: Diagnosis not present

## 2022-12-29 ENCOUNTER — Encounter: Payer: Self-pay | Admitting: Family Medicine

## 2022-12-29 ENCOUNTER — Ambulatory Visit (INDEPENDENT_AMBULATORY_CARE_PROVIDER_SITE_OTHER): Payer: Medicare (Managed Care) | Admitting: Family Medicine

## 2022-12-29 VITALS — BP 132/55 | HR 81 | Ht 64.0 in | Wt 124.0 lb

## 2022-12-29 DIAGNOSIS — E039 Hypothyroidism, unspecified: Secondary | ICD-10-CM

## 2022-12-29 DIAGNOSIS — I951 Orthostatic hypotension: Secondary | ICD-10-CM

## 2022-12-29 DIAGNOSIS — N952 Postmenopausal atrophic vaginitis: Secondary | ICD-10-CM | POA: Diagnosis not present

## 2022-12-29 DIAGNOSIS — M47812 Spondylosis without myelopathy or radiculopathy, cervical region: Secondary | ICD-10-CM

## 2022-12-29 DIAGNOSIS — M797 Fibromyalgia: Secondary | ICD-10-CM | POA: Diagnosis not present

## 2022-12-29 DIAGNOSIS — F411 Generalized anxiety disorder: Secondary | ICD-10-CM | POA: Diagnosis not present

## 2022-12-29 MED ORDER — ESTRADIOL 0.1 MG/GM VA CREA: 1.0000 | TOPICAL_CREAM | VAGINAL | 11 refills | Status: DC

## 2022-12-29 MED ORDER — FAMOTIDINE 20 MG PO TABS: ORAL_TABLET | ORAL | 1 refills | Status: DC

## 2022-12-29 NOTE — Assessment & Plan Note (Signed)
Using vaginal estrogen weekly and that has worked well to reduce discomfort and help prevent UTIs.

## 2022-12-29 NOTE — Assessment & Plan Note (Signed)
Unfortunately she was not a candidate for a spinal stimulator so she is actually thinking about getting back in with Dr. Karie Schwalbe for facet injections they have been helpful in the past.

## 2022-12-29 NOTE — Assessment & Plan Note (Signed)
Due to recheck TSH levels.

## 2022-12-29 NOTE — Assessment & Plan Note (Signed)
Pressure looks good today and she actually has felt better since she has been trying to drink more fluid.

## 2022-12-29 NOTE — Assessment & Plan Note (Signed)
Well controlled on wellbutrin and sertaline.

## 2022-12-29 NOTE — Progress Notes (Signed)
Established Patient Office Visit  Subjective   Patient ID: Robin Conrad, female    DOB: [birth year]  Age: 72 y.o. MRN: [id number]  Chief Complaint  Patient presents with   Fibromyalgia    HPI  Here for follow-up myalgia-no recent changes or exacerbations.  Follow-up mood-most days she feels good she occasionally might have a day where she feels a little bit more down.  Hypothyroidism-taking medication regularly no recent changes.  He was seen by one of my partners recently on August 14 for orthostatic hypotension and dizziness.  She was encouraged to drink more fluid and she has been trying to be more consistent with that and has felt better she has not had any more episodes since then.    ROS    Objective:     BP (!) 132/55   Pulse 81   Ht 5\' 4"  (1.626 m)   Wt 124 lb (56.2 kg)   SpO2 98%   BMI 21.28 kg/m    Physical Exam Vitals and nursing note reviewed.  Constitutional:      Appearance: Normal appearance.  HENT:     Head: Normocephalic and atraumatic.  Eyes:     Conjunctiva/sclera: Conjunctivae normal.  Cardiovascular:     Rate and Rhythm: Normal rate and regular rhythm.  Pulmonary:     Effort: Pulmonary effort is normal.     Breath sounds: Normal breath sounds.  Skin:    General: Skin is warm and dry.  Neurological:     Mental Status: She is alert.  Psychiatric:        Mood and Affect: Mood normal.      No results found for any visits on 12/29/22.    The 10-year ASCVD risk score (Arnett DK, et al., 2019) is: 13.2%    Assessment & Plan:   Problem List Items Addressed This Visit       Cardiovascular and Mediastinum   Orthostatic hypotension    Pressure looks good today and she actually has felt better since she has been trying to drink more fluid.        Endocrine   Hypothyroid - Primary    Due to recheck TSH levels.      Relevant Medications   estradiol (ESTRACE) 0.1 MG/GM vaginal cream   famotidine (PEPCID) 20 MG tablet    Other Relevant Orders   TSH   Basic Metabolic Panel (BMET)     Musculoskeletal and Integument   Cervical spondylosis    Unfortunately she was not a candidate for a spinal stimulator so she is actually thinking about getting back in with Dr. Karie Schwalbe for facet injections they have been helpful in the past.        Genitourinary   Atrophic vaginitis    Using vaginal estrogen weekly and that has worked well to reduce discomfort and help prevent UTIs.         Other   GAD (generalized anxiety disorder)    Well controlled on wellbutrin and sertaline.       Fibromyalgia    Stable.  No recent flares or exacerbations.       Return in about 3 months (around 03/31/2023) for return in 3 months fibro.    Nani Gasser, MD

## 2022-12-29 NOTE — Assessment & Plan Note (Signed)
Stable.  No recent flares or exacerbations.

## 2022-12-30 LAB — BASIC METABOLIC PANEL
BUN/Creatinine Ratio: 21 (ref 12–28)
BUN: 22 mg/dL (ref 8–27)
CO2: 27 mmol/L (ref 20–29)
Calcium: 9.9 mg/dL (ref 8.7–10.3)
Chloride: 101 mmol/L (ref 96–106)
Creatinine, Ser: 1.06 mg/dL — ABNORMAL HIGH (ref 0.57–1.00)
Glucose: 60 mg/dL — ABNORMAL LOW (ref 70–99)
Potassium: 4.5 mmol/L (ref 3.5–5.2)
Sodium: 141 mmol/L (ref 134–144)
eGFR: 55 mL/min/{1.73_m2} — ABNORMAL LOW (ref >59)

## 2022-12-30 LAB — TSH: TSH: 2.5 u[IU]/mL (ref 0.450–4.500)

## 2022-12-30 NOTE — Progress Notes (Signed)
Hi Robin Conrad, your kidney function is stable and your thyroid looks good. Continue to hydrate.

## 2023-01-06 DIAGNOSIS — J3089 Other allergic rhinitis: Secondary | ICD-10-CM | POA: Diagnosis not present

## 2023-01-06 DIAGNOSIS — J3081 Allergic rhinitis due to animal (cat) (dog) hair and dander: Secondary | ICD-10-CM | POA: Diagnosis not present

## 2023-01-06 DIAGNOSIS — J301 Allergic rhinitis due to pollen: Secondary | ICD-10-CM | POA: Diagnosis not present

## 2023-01-13 ENCOUNTER — Other Ambulatory Visit: Payer: Self-pay | Admitting: Family Medicine

## 2023-01-14 DIAGNOSIS — Z791 Long term (current) use of non-steroidal anti-inflammatories (NSAID): Secondary | ICD-10-CM | POA: Diagnosis not present

## 2023-01-14 DIAGNOSIS — M351 Other overlap syndromes: Secondary | ICD-10-CM | POA: Diagnosis not present

## 2023-01-14 DIAGNOSIS — M797 Fibromyalgia: Secondary | ICD-10-CM | POA: Diagnosis not present

## 2023-01-20 ENCOUNTER — Other Ambulatory Visit (HOSPITAL_BASED_OUTPATIENT_CLINIC_OR_DEPARTMENT_OTHER): Payer: Self-pay

## 2023-01-20 DIAGNOSIS — J3089 Other allergic rhinitis: Secondary | ICD-10-CM | POA: Diagnosis not present

## 2023-01-20 DIAGNOSIS — J301 Allergic rhinitis due to pollen: Secondary | ICD-10-CM | POA: Diagnosis not present

## 2023-01-20 DIAGNOSIS — J3081 Allergic rhinitis due to animal (cat) (dog) hair and dander: Secondary | ICD-10-CM | POA: Diagnosis not present

## 2023-02-03 DIAGNOSIS — J3081 Allergic rhinitis due to animal (cat) (dog) hair and dander: Secondary | ICD-10-CM | POA: Diagnosis not present

## 2023-02-03 DIAGNOSIS — J3089 Other allergic rhinitis: Secondary | ICD-10-CM | POA: Diagnosis not present

## 2023-02-03 DIAGNOSIS — J301 Allergic rhinitis due to pollen: Secondary | ICD-10-CM | POA: Diagnosis not present

## 2023-02-08 DIAGNOSIS — J3081 Allergic rhinitis due to animal (cat) (dog) hair and dander: Secondary | ICD-10-CM | POA: Diagnosis not present

## 2023-02-08 DIAGNOSIS — J301 Allergic rhinitis due to pollen: Secondary | ICD-10-CM | POA: Diagnosis not present

## 2023-02-08 DIAGNOSIS — J3089 Other allergic rhinitis: Secondary | ICD-10-CM | POA: Diagnosis not present

## 2023-02-17 DIAGNOSIS — J3081 Allergic rhinitis due to animal (cat) (dog) hair and dander: Secondary | ICD-10-CM | POA: Diagnosis not present

## 2023-02-17 DIAGNOSIS — J3089 Other allergic rhinitis: Secondary | ICD-10-CM | POA: Diagnosis not present

## 2023-02-17 DIAGNOSIS — J301 Allergic rhinitis due to pollen: Secondary | ICD-10-CM | POA: Diagnosis not present

## 2023-03-03 DIAGNOSIS — J3089 Other allergic rhinitis: Secondary | ICD-10-CM | POA: Diagnosis not present

## 2023-03-03 DIAGNOSIS — J301 Allergic rhinitis due to pollen: Secondary | ICD-10-CM | POA: Diagnosis not present

## 2023-03-03 DIAGNOSIS — J3081 Allergic rhinitis due to animal (cat) (dog) hair and dander: Secondary | ICD-10-CM | POA: Diagnosis not present

## 2023-03-08 ENCOUNTER — Other Ambulatory Visit: Payer: Self-pay | Admitting: Family Medicine

## 2023-03-08 DIAGNOSIS — M797 Fibromyalgia: Secondary | ICD-10-CM

## 2023-03-08 DIAGNOSIS — M5416 Radiculopathy, lumbar region: Secondary | ICD-10-CM

## 2023-04-03 ENCOUNTER — Other Ambulatory Visit: Payer: Self-pay | Admitting: Family Medicine

## 2023-04-03 DIAGNOSIS — N390 Urinary tract infection, site not specified: Secondary | ICD-10-CM

## 2023-04-03 DIAGNOSIS — M797 Fibromyalgia: Secondary | ICD-10-CM

## 2023-04-03 DIAGNOSIS — M7541 Impingement syndrome of right shoulder: Secondary | ICD-10-CM

## 2023-04-05 ENCOUNTER — Ambulatory Visit (INDEPENDENT_AMBULATORY_CARE_PROVIDER_SITE_OTHER): Payer: Medicare Other | Admitting: Family Medicine

## 2023-04-05 ENCOUNTER — Encounter: Payer: Self-pay | Admitting: Family Medicine

## 2023-04-05 VITALS — BP 132/61 | HR 84 | Ht 64.0 in | Wt 123.0 lb

## 2023-04-05 DIAGNOSIS — M797 Fibromyalgia: Secondary | ICD-10-CM

## 2023-04-05 DIAGNOSIS — M359 Systemic involvement of connective tissue, unspecified: Secondary | ICD-10-CM | POA: Diagnosis not present

## 2023-04-05 DIAGNOSIS — M961 Postlaminectomy syndrome, not elsewhere classified: Secondary | ICD-10-CM | POA: Diagnosis not present

## 2023-04-05 NOTE — Assessment & Plan Note (Addendum)
On tramadol prn. Currently managing with her regimen. Continue gabapentin as well.

## 2023-04-05 NOTE — Assessment & Plan Note (Signed)
Continue with meloxicam.  And continue to monitor renal function twice a year since she is on chronic NSAID.

## 2023-04-05 NOTE — Assessment & Plan Note (Addendum)
Continue current regimen of SSRI. Marland Kitchen  She is not interested in adding any more medications to her regimen at this point.

## 2023-04-05 NOTE — Progress Notes (Signed)
   Established Patient Office Visit  Subjective  Patient ID: Robin Conrad, female    DOB: 01-06-1951  Age: 73 y.o. MRN: [id number]  Chief Complaint  Patient presents with   Fibromyalgia    HPI  F/U Fibro -she is doing well over.  She still struggles sometimes with her left shoulder intermittently and symptoms of tingling in her hands and feet intermittently.  But it can go away for periods of time.  She does feel that the ablation on her cervical spine was helpful she is not completely pain-free but it did help.  She occasionally still gets the pain on her left low back close to where her battery pack as for her bladder.  Still having some occasional headaches when she wakes up first thing in the morning.  Had  Flu and COVID vaccines.    [place] her time with her grandchildren.      ROS    Objective:     BP 132/61   Pulse 84   Ht 5\' 4"  (1.626 m)   Wt 123 lb (55.8 kg)   SpO2 99%   BMI 21.11 kg/m    Physical Exam Vitals and nursing note reviewed.  Constitutional:      Appearance: Normal appearance.  HENT:     Head: Normocephalic and atraumatic.  Eyes:     Conjunctiva/sclera: Conjunctivae normal.  Cardiovascular:     Rate and Rhythm: Normal rate and regular rhythm.  Pulmonary:     Effort: Pulmonary effort is normal.     Breath sounds: Normal breath sounds.  Skin:    General: Skin is warm and dry.  Neurological:     Mental Status: She is alert.  Psychiatric:        Mood and Affect: Mood normal.      No results found for any visits on 04/05/23.    The 10-year ASCVD risk score (Arnett DK, et al., 2019) is: 13.2%    Assessment & Plan:   Problem List Items Addressed This Visit       Other   Fibromyalgia - Primary   Continue current regimen of SSRI. Marland Kitchen  She is not interested in adding any more medications to her regimen at this point.      Failed back syndrome   On tramadol prn. Currently managing with her regimen. Continue gabapentin as well.        Connective tissue disease (HCC)   Continue with meloxicam.  And continue to monitor renal function twice a year since she is on chronic NSAID.       Return in about 4 months (around 08/03/2023) for Fibro.   I spent 40  minutes on the day of the encounter to include pre-visit record review, face-to-face time with the patient and post visit ordering of test.   Nani Gasser, MD

## 2023-04-07 ENCOUNTER — Ambulatory Visit: Payer: Self-pay | Admitting: Family Medicine

## 2023-04-07 ENCOUNTER — Ambulatory Visit
Admission: RE | Admit: 2023-04-07 | Discharge: 2023-04-07 | Disposition: A | Discharge status: Home / Self Care | Payer: Medicare Other | Source: Ambulatory Visit | Attending: Emergency Medicine | Admitting: Emergency Medicine

## 2023-04-07 VITALS — BP 131/70 | HR 82 | Temp 97.7°F | Resp 17

## 2023-04-07 DIAGNOSIS — M25532 Pain in left wrist: Secondary | ICD-10-CM | POA: Diagnosis not present

## 2023-04-07 MED ORDER — PREDNISONE 20 MG PO TABS: 20.0000 mg | ORAL_TABLET | Freq: Every day | ORAL | 0 refills | Status: AC

## 2023-04-07 NOTE — Discharge Instructions (Addendum)
Try the wrist brace for support Prednisone -- 1 tablet daily for 5 days Contact your rheumatologist for follow up I also recommend to see orthopedics

## 2023-04-07 NOTE — Telephone Encounter (Signed)
Chief Complaint: L hand pain and numbness Symptoms: pain, numbness Frequency: began this episode 04/05/23 Pertinent Negatives: Patient denies injury, discoloration of hand Disposition: [] ED /[x] Urgent Care (no appt availability in office) / [] Appointment(In office/virtual)/ []  [facility]/ [] Home Care/ [] Refused Recommended Disposition /[] [facility]/ []  Follow-up with PCP Additional Notes: Patient calls stating she has now constant numbness and pain in her L hand and wrist that began 04/05/23 after seeing PCP. She reports she has tried several OTC and prescription pain medications with no relief. Per protocol, patient to be evaluated within 4 hours. Next available appt with any provider 04/08/23 @ 0910. Patient states she will go to [facility] in [place], assisted patient with scheduling for today at 1345. Care advice reviewed, patient verbalized understanding. Alerting PCP for review.   Copied from CRM [id number]. Topic: Clinical - Red Word Triage >> Apr 07, 2023  9:41 AM Nila Nephew wrote: Red Word that prompted transfer to Nurse Triage: Hand is numb and it is very painful - post spinal surgery Reason for Disposition  [1] SEVERE pain (e.g., excruciating, unable to use hand at all) AND [2] not improved after 2 hours of pain medicine  Answer Assessment - Initial Assessment Questions 1. SYMPTOM: "What is the main symptom you are concerned about?" (e.g., weakness, numbness)     L hand numbness "mostly" and pain, in the past has had this in both hands. States she has pain from her wrist to the tip of her fingers. 2. ONSET: "When did this start?" (minutes, hours, days; while sleeping)     04/05/23- states she reported to PCP at appt this date and states previously it was intermittent. 3. LAST NORMAL: "When was the last time you (the patient) were normal (no symptoms)?"     04/05/23- states she can use her hand but tries not to. 4. PATTERN "Does this come and go, or has it  been constant since it started?"  "Is it present now?"     States it is constant at this time. 5. CARDIAC SYMPTOMS: "Have you had any of the following symptoms: chest pain, difficulty breathing, palpitations?"     Denies 6. NEUROLOGIC SYMPTOMS: "Have you had any of the following symptoms: headache, dizziness, vision loss, double vision, changes in speech, unsteady on your feet?"     Denies 7. OTHER SYMPTOMS: "Do you have any other symptoms?"     Arm and shoulder can be tender at times, but states it is related to fibromyalgia.  Answer Assessment - Initial Assessment Questions 1. ONSET: "When did the pain start?"     04/05/23 2. LOCATION: "Where is the pain located?"     L hand wrist to fingers 3. PAIN: "How bad is the pain?" (Scale 1-10; or mild, moderate, severe)   - MILD (1-3): doesn't interfere with normal activities   - MODERATE (4-7): interferes with normal activities (e.g., work or school) or awakens from sleep   - SEVERE (8-10): excruciating pain, unable to use hand at all     10/10 4. WORK OR EXERCISE: "Has there been any recent work or exercise that involved this part (i.e., hand or wrist) of the body?"     Denies 5. CAUSE: "What do you think is causing the pain?"     In the past, states she has connective tissue disease and was diagnosed with it in the past. 6. AGGRAVATING FACTORS: "What makes the pain worse?" (e.g., using computer)     States nothing makes it  better, use can increase the pain. 7. OTHER SYMPTOMS: "Do you have any other symptoms?" (e.g., neck pain, swelling, rash, numbness, fever)     numbness  Protocols used: Hand and Wrist Pain-A-AH, Neurologic Deficit-A-AH

## 2023-04-07 NOTE — ED Triage Notes (Addendum)
Pt c/o LT hand/wrist pain since Tues. Denies injury. Hx of fibromyalgia and arthritis. Tramadol and mobic prn.
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
## 2023-04-20 ENCOUNTER — Other Ambulatory Visit: Payer: Self-pay | Admitting: Family Medicine

## 2023-04-20 DIAGNOSIS — Z1231 Encounter for screening mammogram for malignant neoplasm of breast: Secondary | ICD-10-CM

## 2023-04-25 ENCOUNTER — Ambulatory Visit (INDEPENDENT_AMBULATORY_CARE_PROVIDER_SITE_OTHER): Payer: Medicare Other

## 2023-04-25 VITALS — Ht 64.0 in | Wt 123.0 lb

## 2023-04-25 DIAGNOSIS — M810 Age-related osteoporosis without current pathological fracture: Secondary | ICD-10-CM

## 2023-04-25 DIAGNOSIS — Z780 Asymptomatic menopausal state: Secondary | ICD-10-CM | POA: Diagnosis not present

## 2023-04-25 DIAGNOSIS — Z0000 Encounter for general adult medical examination without abnormal findings: Secondary | ICD-10-CM
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
## 2023-04-25 NOTE — Patient Instructions (Signed)
  Robin Conrad , Thank you for taking time to come for your Medicare Wellness Visit. I appreciate your ongoing commitment to your health goals. Please review the following plan we discussed and let me know if I can assist you in the future.   These are the goals we discussed:  Goals       Exercise 3x per week (30 min per time)      Try and get at least 3 days of walking in at 30 minutes at a time.      Exercise 3x per week (30 min per time)      She can get the silver sneaker plan.       Patient Stated      Patient states to build more strength and endurance      Patient Stated (pt-stated)      04/06/2021 AWV Goal: Exercise for General Health  Patient will verbalize understanding of the benefits of increased physical activity: Exercising regularly is important. It will improve your overall fitness, flexibility, and endurance. Regular exercise also will improve your overall health. It can help you control your weight, reduce stress, and improve your bone density. Over the next year, patient will increase physical activity as tolerated with a goal of at least 150 minutes of moderate physical activity per week.  You can tell that you are exercising at a moderate intensity if your heart starts beating faster and you start breathing faster but can still hold a conversation. Moderate-intensity exercise ideas include: Walking 1 mile (1.6 km) in about 15 minutes Biking Hiking Golfing Dancing Water aerobics Patient will verbalize understanding of everyday activities that increase physical activity by providing examples like the following: Yard work, such as: Insurance underwriter Gardening Washing windows or floors Patient will be able to explain general safety guidelines for exercising:  Before you start a new exercise program, talk with your health care provider. Do not exercise so much that you hurt yourself,  feel dizzy, or get very short of breath. Wear comfortable clothes and wear shoes with good support. Drink plenty of water while you exercise to prevent dehydration or heat stroke. Work out until your breathing and your heartbeat get faster.       Patient Stated (pt-stated)      Patient stated that she would like to be able to walk more outside.         This is a list of the screening recommended for you and due dates:  Health Maintenance  Topic Date Due   COVID-19 Vaccine (8 - 2024-25 season) 01/07/2023   Medicare Annual Wellness Visit  04/24/2024   Mammogram  05/18/2024   Cologuard (Stool DNA test)  05/10/2025   DTaP/Tdap/Td vaccine (3 - Td or Tdap) 04/15/2027   Pneumonia Vaccine  Completed   Flu Shot  Completed   DEXA scan (bone density measurement)  Completed   Hepatitis C Screening  Completed   Zoster (Shingles) Vaccine  Completed   HPV Vaccine  Aged Out

## 2023-04-25 NOTE — Addendum Note (Signed)
 Addended by: Doretha Ganja on: 04/25/2023 02:42 PM   Modules accepted: Level of Service

## 2023-05-03 ENCOUNTER — Ambulatory Visit: Payer: Self-pay | Admitting: Family Medicine

## 2023-05-03 NOTE — Telephone Encounter (Signed)
 Copied from CRM [id number]. Topic: Clinical - Red Word Triage >> May 03, 2023  1:48 PM Gaetano Hawthorne wrote: [facility] that prompted transfer to Nurse Triage: Patient has been experiencing some right arm and shoulder pain (pain goes down her arm). Her left arm is hurting as well.  She went to the urgent care previousily for her hand hurting for a few weeks and she also has followed up with her rheumatologist - who changed her medication to lodine recently.   Chief Complaint: Arm pain Symptoms: Right sided shoulder and arm pain Frequency: Constant  Pertinent Negatives: Patient denies fever, swelling, numbness, weakness  Disposition: [] ED /[] Urgent Care (no appt availability in office) / [x] Appointment(In office/virtual)/ []  [facility]/ [] Home Care/ [] Refused Recommended Disposition /[] [facility]/ []  Follow-up with PCP Additional Notes: Patient reports right arm pain that began 3 days ago. She states her pain radiates from her shoulder down her arm. She denies any swelling, fever, or rash. Patient reports a history of fibromyalgia and connective tissue disorder and is unsure if that is what's causing her symptoms. Appointment made and patient instructed to call back for new or worsening symptoms. Patient verbalized understanding and agreement with this plan.    Reason for Disposition  [1] MODERATE pain (e.g., interferes with normal activities) AND [2] present > 3 days  Answer Assessment - Initial Assessment Questions 1. ONSET: "When did the pain start?"     3 days 2. LOCATION: "Where is the pain located?"     Right shoulder down her arm 3. PAIN: "How bad is the pain?" (Scale 1-10; or mild, moderate, severe)   - MILD (1-3): Doesn't interfere with normal activities.   - MODERATE (4-7): Interferes with normal activities (e.g., work or school) or awakens from sleep.   - SEVERE (8-10): Excruciating pain, unable to do any normal activities, unable to hold a cup of water.     Moderate  to severe  4. WORK OR EXERCISE: "Has there been any recent work or exercise that involved this part of the body?"     No 5. CAUSE: "What do you think is causing the arm pain?"     History of arthritis  6. OTHER SYMPTOMS: "Do you have any other symptoms?" (e.g., neck pain, swelling, rash, fever, numbness, weakness)     No  7. PREGNANCY: "Is there any chance you are pregnant?" "When was your last menstrual period?"     No  Protocols used: Arm Pain-A-AH

## 2023-05-05 ENCOUNTER — Ambulatory Visit (INDEPENDENT_AMBULATORY_CARE_PROVIDER_SITE_OTHER): Payer: Medicare Other | Admitting: Family Medicine

## 2023-05-05 ENCOUNTER — Encounter: Payer: Self-pay | Admitting: Family Medicine

## 2023-05-05 VITALS — BP 109/71 | HR 88 | Ht 64.0 in | Wt 125.0 lb

## 2023-05-05 DIAGNOSIS — S40021A Contusion of right upper arm, initial encounter: Secondary | ICD-10-CM

## 2023-05-05 DIAGNOSIS — M25511 Pain in right shoulder: Secondary | ICD-10-CM | POA: Diagnosis not present

## 2023-05-05 DIAGNOSIS — K219 Gastro-esophageal reflux disease without esophagitis: Secondary | ICD-10-CM | POA: Diagnosis not present

## 2023-05-05 DIAGNOSIS — M79602 Pain in left arm: Secondary | ICD-10-CM | POA: Diagnosis not present

## 2023-05-05 MED ORDER — FAMOTIDINE 20 MG PO TABS
20.0000 mg | ORAL_TABLET | Freq: Every day | ORAL | 1 refills | Status: AC
Start: 2023-05-05 — End: ?

## 2023-05-05 NOTE — Progress Notes (Signed)
 Established Patient Office Visit  Subjective  Patient ID: Robin Conrad, female    DOB: 1950-10-06  Age: 73 y.o. MRN: [id number]  Chief Complaint  Patient presents with   Arm Pain    Right arm    Shoulder Pain    Right shoulder     HPI  She has been struggling with some upper body joint pain.  In particular she has been having some left upper arm and shoulder pain as well as left wrist pain.  No specific injury or trauma.  The wrist has gradually gotten better but then she had some blood drawn in her right posterior hand and then that got really bruised and swollen and painful.  Now she is having the most pain in her right upper arm and lateral shoulder.  She said that it was so painful it was hard to even lift up to turn the water on at the sink nonetheless get it up to 90 degrees.  Again no specific injury or trauma just seems like it is moving and shifting she does have a history of fibromyalgia with myofascial pain.  Times the pain was bad enough that it would literally radiate down her whole arm.     ROS    Objective:     BP 109/71   Pulse 88   Ht 5\' 4"  (1.626 m)   Wt 125 lb (56.7 kg)   SpO2 94%   BMI 21.46 kg/m     Physical Exam Vitals reviewed.  Constitutional:      Appearance: Normal appearance.  HENT:     Head: Normocephalic.  Pulmonary:     Effort: Pulmonary effort is normal.  Musculoskeletal:     Comments: Shoulder is tender laterally.  She is able to extend to 180 degrees but has pain with doing so she is able to reach to just above her belt line and is able to reach across her shoulder.  On the dorsum of the right hand there is a bruise that is fading a lot of the swelling has gone down no sign of phlebitis.  The blood vessels are easily compressible.  Neurological:     Mental Status: She is alert and oriented to person, place, and time.  Psychiatric:        Mood and Affect: Mood normal.        Behavior: Behavior normal.      No results  found for any visits on 05/05/23.     The 10-year ASCVD risk score (Arnett DK, et al., 2019) is: 9.3%    Assessment & Plan:   Problem List Items Addressed This Visit   None Visit Diagnoses       Acute pain of right shoulder    -  Primary     Hematoma of arm, right, initial encounter         Left arm pain         Gastroesophageal reflux disease, unspecified whether esophagitis present       Relevant Medications   famotidine (PEPCID) 20 MG tablet       Shoulder pain, Right -most consistent with bursitis she has improved range of motion this week which is great but still a fair amount of discomfort we discussed bumping up her tramadol to 3 times a day at least for short-term continuing to use her heat ice and topicals as needed.  Given handout with stretches to do on her own at home.  The swelling hematoma on the  right wrist seems to be healing really well no sign of phlebitis which is great.  Continue conservative manage sugars.  Needs famotidine prescription corrected.  Is taking it once a day.  Return if symptoms worsen or fail to improve.    Nani Gasser, MD

## 2023-05-05 NOTE — Patient Instructions (Signed)
 Follow-up in 3 weeks if not improving we can always get you back in with either rheumatology or sports med.

## 2023-05-06 ENCOUNTER — Encounter: Payer: Self-pay | Admitting: Family Medicine

## 2023-05-06 ENCOUNTER — Other Ambulatory Visit: Payer: Self-pay | Admitting: Family Medicine

## 2023-05-06 DIAGNOSIS — M5416 Radiculopathy, lumbar region: Secondary | ICD-10-CM

## 2023-05-06 DIAGNOSIS — M797 Fibromyalgia: Secondary | ICD-10-CM

## 2023-05-07 ENCOUNTER — Encounter: Payer: Self-pay | Admitting: *Deleted

## 2023-05-07 ENCOUNTER — Ambulatory Visit
Admission: EM | Admit: 2023-05-07 | Discharge: 2023-05-07 | Disposition: A | Discharge status: Home / Self Care | Payer: Medicare Other | Attending: Family Medicine | Admitting: Family Medicine

## 2023-05-07 ENCOUNTER — Other Ambulatory Visit: Payer: Self-pay

## 2023-05-07 DIAGNOSIS — R21 Rash and other nonspecific skin eruption: Secondary | ICD-10-CM

## 2023-05-07 MED ORDER — FEXOFENADINE HCL 180 MG PO TABS: 180.0000 mg | ORAL_TABLET | Freq: Every day | ORAL | 0 refills | Status: DC

## 2023-05-07 NOTE — ED Triage Notes (Addendum)
 C/O generalized flat, "blotchy", non-pruritic rash, possible onset "earlier in the week". Denies fevers. Has not been applying any topical treatments to rash. Also c/o dry mouth.
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
## 2023-05-07 NOTE — Discharge Instructions (Addendum)
 Advised patient to take Allegra daily for the next 5 days. Encouraged to increase daily water intake to 64 ounces per day while taking these medications.  Advised if symptoms worsen and/or unresolved please follow-up with PCP or here for further evaluation.

## 2023-05-08 NOTE — Telephone Encounter (Signed)
 Any fever chills? URI sxs?  Can she send a pic?itchy?

## 2023-05-09 NOTE — Telephone Encounter (Signed)
 Spoke with patient - was seen at [facility] on Saturday 05/07/23.   She states that they took a picture of the rash on her arms ( but rash is all over body)  She states she did have a medication change from meloxicam to lodine  And a pharmacist her daughter spoke with states that he is 99% sure her symptoms are caused by this medication change.  She stats that she did have occasional chills , no fevers,  does have nasal congestion with some bloody mucus, headache, swelling of  feet and ankles bilaterally.  She states today the rash has receded somewhat.

## 2023-05-09 NOTE — Telephone Encounter (Signed)
 Attempted call to patient. Left a voice mail message requesting a return call.

## 2023-05-11 ENCOUNTER — Ambulatory Visit: Payer: Medicare Other | Admitting: Physician Assistant

## 2023-05-27 ENCOUNTER — Other Ambulatory Visit: Payer: Self-pay | Admitting: Family Medicine

## 2023-05-27 DIAGNOSIS — E039 Hypothyroidism, unspecified: Secondary | ICD-10-CM

## 2023-06-08 ENCOUNTER — Ambulatory Visit: Payer: Medicare Other

## 2023-06-08 ENCOUNTER — Encounter: Payer: Self-pay | Admitting: Family Medicine

## 2023-06-08 DIAGNOSIS — Z1231 Encounter for screening mammogram for malignant neoplasm of breast: Secondary | ICD-10-CM

## 2023-06-08 DIAGNOSIS — Z780 Asymptomatic menopausal state: Secondary | ICD-10-CM

## 2023-06-08 DIAGNOSIS — M810 Age-related osteoporosis without current pathological fracture: Secondary | ICD-10-CM | POA: Diagnosis not present

## 2023-06-08 NOTE — Progress Notes (Signed)
 Robin Conrad, bone density shows a T-score of -2.3 in the osteopenia category.  Significant change compared to prior study back in 2023.  That is wonderful.  Making sure to continue with adequate calcium and vitamin D intake as well as regular resistance exercise and trying to keep your bones strong.

## 2023-06-09 ENCOUNTER — Ambulatory Visit (INDEPENDENT_AMBULATORY_CARE_PROVIDER_SITE_OTHER): Admitting: Family Medicine

## 2023-06-09 ENCOUNTER — Encounter: Payer: Self-pay | Admitting: Family Medicine

## 2023-06-09 ENCOUNTER — Ambulatory Visit: Payer: Self-pay | Admitting: Family Medicine

## 2023-06-09 ENCOUNTER — Ambulatory Visit (INDEPENDENT_AMBULATORY_CARE_PROVIDER_SITE_OTHER)

## 2023-06-09 VITALS — BP 100/53 | HR 83 | Temp 97.8°F | Ht 64.5 in | Wt 123.2 lb

## 2023-06-09 DIAGNOSIS — M1811 Unilateral primary osteoarthritis of first carpometacarpal joint, right hand: Secondary | ICD-10-CM | POA: Diagnosis not present

## 2023-06-09 DIAGNOSIS — M25531 Pain in right wrist: Secondary | ICD-10-CM | POA: Diagnosis not present

## 2023-06-09 DIAGNOSIS — M11241 Other chondrocalcinosis, right hand: Secondary | ICD-10-CM

## 2023-06-09 DIAGNOSIS — M19041 Primary osteoarthritis, right hand: Secondary | ICD-10-CM

## 2023-06-09 DIAGNOSIS — L03113 Cellulitis of right upper limb: Secondary | ICD-10-CM | POA: Insufficient documentation

## 2023-06-09 MED ORDER — CEPHALEXIN 500 MG PO CAPS: 500.0000 mg | ORAL_CAPSULE | Freq: Two times a day (BID) | ORAL | 0 refills | Status: DC

## 2023-06-09 NOTE — Progress Notes (Signed)
 Acute Office Visit  Subjective:     Patient ID: Robin Conrad, female    DOB: Jul 14, 1950, 73 y.o.   MRN: [id number]  Chief Complaint  Patient presents with   Hand Pain    Right hand pain up to elbow, fingers of right hand numbness , arm discomfort, swelling at wrist - self diagnosis of carpel tunnel - x last night - states never seen by Dr. Linford Arnold for this before - this is new issue for patient.     HPI Patient is in today for right hand pain that radiates to elbow and fingers. Self diagnosis of carpel tunnel. This is a new problem. Swelling and redness to wrist.  No fever.  Symptoms present for last night with pain in the palm, fingers started going numb.  No injury.  History of fibromyalgia. Does not think this is related.  Tinel: tingling present before.  Phalen: tingling present before test.  Symptoms did not worsen with these test.   Review of Systems  Musculoskeletal:  Positive for joint pain.        Objective:    BP (!) 100/53   Pulse 83   Temp 97.8 F (36.6 C)   Ht 5' 4.5" (1.638 m)   Wt 123 lb 3 oz (55.9 kg)   SpO2 97%   BMI 20.82 kg/m  BP Readings from Last 3 Encounters:  06/09/23 (!) 100/53  05/07/23 115/61  05/05/23 109/71      Physical Exam Vitals and nursing note reviewed.  Constitutional:      General: She is not in acute distress.    Appearance: Normal appearance. She is not ill-appearing.  Cardiovascular:     Rate and Rhythm: Normal rate.  Pulmonary:     Effort: Pulmonary effort is normal.  Musculoskeletal:     Right wrist: Swelling and tenderness present. Decreased range of motion. Normal pulse.       Arms:     Comments: Erythema and warmth to touch  noted in right wrist  Skin:    General: Skin is warm and dry.  Neurological:     General: No focal deficit present.     Mental Status: She is alert. Mental status is at baseline.  Psychiatric:        Mood and Affect: Mood normal.        Behavior: Behavior normal.         Thought Content: Thought content normal.        Judgment: Judgment normal.    No results found for any visits on 06/09/23.      Assessment & Plan:   Problem List Items Addressed This Visit     Right wrist pain - Primary   Right hand and wrist pain that is causing pain, numbness and tingling. Symptoms started last night. Decreased ROM in fingers and wrist. Unable to illicit a response with Tinel and Phalen's signs due to ongoing symptoms. There is erythema, slight swelling, and warmth to touch on dorsal side. X-ray to rule out fracture due to acute nature of symptoms. Will treat for cellulitis due to erythema and warmth.  Wrist brace. Follow-up with sports medicine for wrist pain.       Relevant Orders   DG Wrist Complete Right (Completed)   DG Hand Complete Right (Completed)   Cellulitis of right wrist   Erythema, warmth, and swelling to dorsal right wrist. Keflex 500 mg BID x 10 days. CrCl 41 Follow-up with PCP if symptoms do not resolve.  Relevant Medications   cephALEXin (KEFLEX) 500 MG capsule  Agrees with plan of care discussed.  Questions answered.   Return for with PCP in one week.  Novella Olive, FNP

## 2023-06-09 NOTE — Telephone Encounter (Signed)
 Copied from CRM [id number]. Topic: Clinical - Red Word Triage >> Jun 09, 2023  8:35 AM Ivette P wrote: [facility] that prompted transfer to Nurse Triage: carpul tunnel, hand started hurting around 8pm started in the middle and couldn't feel in fingers and went numb. did not sleep at all.  Chief Complaint: Hand pain Symptoms: Tingling in fingers Frequency: Since last night Pertinent Negatives: Patient denies relief Disposition: [] ED /[] Urgent Care (no appt availability in office) / [x] Appointment(In office/virtual)/ []  [place] [facility]/ [] Home Care/ [] Refused Recommended Disposition /[] [place] [facility]/ []  Follow-up with PCP Additional Notes: Patient called in to report pain in her right hand that radiates to her elbow. Patient stated the pain is severe and kept her up all night. Patient stated she feels a burning/pins and needles sensation in her fingers. Patient believes the cause to be carpel tunnel. Patient denied changes in speech and vision. Patient denied chest pain and difficulty breathing. Patient denied fever and swelling. Patient stated bending the wrist and applying pressure makes the pain worse. This RN advised patient to see a provider within 4 hours, per protocol. No availability with PCP/PCP office. This RN scheduled same day appointment at alternate office. This RN advised patient to call back if symptoms worsen. Patient complied.   Reason for Disposition  [1] SEVERE pain (e.g., excruciating, unable to use hand at all) AND [2] not improved after 2 hours of pain medicine  Answer Assessment - Initial Assessment Questions 1. ONSET: "When did the pain start?"     8 pm last night 2. LOCATION: "Where is the pain located?"     Started in center of right hand and radiates up to elbow 3. PAIN: "How bad is the pain?" (Scale 1-10; or mild, moderate, severe)   - MILD (1-3): doesn't interfere with normal activities   - MODERATE (4-7): interferes with normal activities (e.g., work or  school) or awakens from sleep   - SEVERE (8-10): excruciating pain, unable to use hand at all     States pain is a 10, states pain kept her up all night  4. WORK OR EXERCISE: "Has there been any recent work or exercise that involved this part (i.e., hand or wrist) of the body?"     Denies 5. CAUSE: "What do you think is causing the pain?"     Carpel tunnel 6. AGGRAVATING FACTORS: "What makes the pain worse?" (e.g., using computer)     Applying pressure 7. OTHER SYMPTOMS: "Do you have any other symptoms?" (e.g., neck pain, swelling, rash, numbness, fever)     States she cannot bend wrist due to pain, tingling/numbness (pins and needles/burning sensation) in fingers, denies changes in vision, denies changes in speech  Protocols used: Hand and Wrist Pain-A-AH

## 2023-06-09 NOTE — Assessment & Plan Note (Addendum)
 Right hand and wrist pain that is causing pain, numbness and tingling. Symptoms started last night. Decreased ROM in fingers and wrist. Unable to illicit a response with Tinel and Phalen's signs due to ongoing symptoms. There is erythema, slight swelling, and warmth to touch on dorsal side. X-ray to rule out fracture due to acute nature of symptoms. Will treat for cellulitis due to erythema and warmth.  Wrist brace. Follow-up with sports medicine for wrist pain.

## 2023-06-09 NOTE — Assessment & Plan Note (Signed)
 Erythema, warmth, and swelling to dorsal right wrist. Keflex 500 mg BID x 10 days. CrCl 41 Follow-up with PCP if symptoms do not resolve.

## 2023-06-10 ENCOUNTER — Encounter: Payer: Self-pay | Admitting: Family Medicine

## 2023-06-10 NOTE — Progress Notes (Signed)
 Please call patient. Normal mammogram.  Repeat in 1 year.

## 2023-06-13 ENCOUNTER — Ambulatory Visit (INDEPENDENT_AMBULATORY_CARE_PROVIDER_SITE_OTHER): Admitting: Sports Medicine

## 2023-06-13 ENCOUNTER — Encounter: Payer: Self-pay | Admitting: Sports Medicine

## 2023-06-13 DIAGNOSIS — R202 Paresthesia of skin: Secondary | ICD-10-CM | POA: Diagnosis not present

## 2023-06-13 DIAGNOSIS — M47812 Spondylosis without myelopathy or radiculopathy, cervical region: Secondary | ICD-10-CM | POA: Diagnosis not present

## 2023-06-13 DIAGNOSIS — R200 Anesthesia of skin: Secondary | ICD-10-CM | POA: Diagnosis not present

## 2023-06-13 NOTE — Assessment & Plan Note (Addendum)
 Robin Conrad is a pleasant 73 year old female, she is a very complicated situation with chronic pain syndrome, widespread hand and wrist osteoarthritis, cervical degenerative disc disease status post C4-C7 fusion with adjacent level disease. She has multiple subspecialists including Dr. Laurian Brim. More recently she started having numbness and tingling both hands, she localizes it in the middle of the palm with radiation to the second and third fingers. Right worse than left. She is wearing a splint when she can but has difficulty tolerating it. She continues with gabapentin, tramadol, Lodine. On exam today she has the classic arthritic appearing hand, she had a negative Tinel's and negative Phalen signs today. This raises the suspicion that her hand discomfort and paresthesias are coming from her neck. To narrow things down we will go ahead and get her set up for nerve conduction and EMG of both upper extremities, I would like to go ahead and get her set up for cervical epidural, she will also get physical therapy for her hand and her neck. I have suggested a new set of hand arthritis compression gloves. I like to see her back after the nerve conduction study to help tie things together.

## 2023-06-13 NOTE — Progress Notes (Signed)
    Procedures performed today:    None.  Independent interpretation of notes and tests performed by another provider:   None.  Brief History, Exam, Impression, and Recommendations:    Numbness and tingling in both hands Robin Conrad is a pleasant 73 year old female, she is a very complicated situation with chronic pain syndrome, widespread hand and wrist osteoarthritis, cervical degenerative disc disease status post C4-C7 fusion with adjacent level disease. She has multiple subspecialists including Dr. Laurian Brim. More recently she started having numbness and tingling both hands, she localizes it in the middle of the palm with radiation to the second and third fingers. Right worse than left. She is wearing a splint when she can but has difficulty tolerating it. She continues with gabapentin, tramadol, Lodine. On exam today she has the classic arthritic appearing hand, she had a negative Tinel's and negative Phalen signs today. This raises the suspicion that her hand discomfort and paresthesias are coming from her neck. To narrow things down we will go ahead and get her set up for nerve conduction and EMG of both upper extremities, I would like to go ahead and get her set up for cervical epidural, she will also get physical therapy for her hand and her neck. I have suggested a new set of hand arthritis compression gloves. I like to see her back after the nerve conduction study to help tie things together.    ____________________________________________ Ihor Austin. Benjamin Stain, M.D., ABFM., CAQSM., AME. Primary Care and Sports Medicine [facility]  Adjunct Professor of Family Medicine  [employer] [facility] [employer]  Restaurant manager, fast food

## 2023-06-14 ENCOUNTER — Ambulatory Visit (INDEPENDENT_AMBULATORY_CARE_PROVIDER_SITE_OTHER): Admitting: Family Medicine

## 2023-06-14 ENCOUNTER — Encounter: Payer: Self-pay | Admitting: Family Medicine

## 2023-06-14 VITALS — BP 128/49 | HR 81 | Temp 97.7°F | Ht 64.5 in | Wt 124.1 lb

## 2023-06-14 DIAGNOSIS — M25531 Pain in right wrist: Secondary | ICD-10-CM | POA: Diagnosis not present

## 2023-06-14 DIAGNOSIS — M359 Systemic involvement of connective tissue, unspecified: Secondary | ICD-10-CM | POA: Diagnosis not present

## 2023-06-14 DIAGNOSIS — M25532 Pain in left wrist: Secondary | ICD-10-CM

## 2023-06-14 DIAGNOSIS — T887XXA Unspecified adverse effect of drug or medicament, initial encounter: Secondary | ICD-10-CM | POA: Diagnosis not present

## 2023-06-14 NOTE — Assessment & Plan Note (Signed)
 I do think contributing to her pain right now.

## 2023-06-14 NOTE — Progress Notes (Signed)
   Acute Office Visit  Subjective:     Patient ID: Robin Conrad, female    DOB: November 19, 1950, 73 y.o.   MRN: [id number]  Chief Complaint  Patient presents with   Wrist Pain    HPI Patient is in today for f/u right hand.  She was seen last week for right right pain and redness.  She was treated for possible cellulitis with Keflex.    She previously had gone to the urgent care for left wrist pain and had also been prescribed Lodine she took it for a solid week and then started to not really feel well and then broke out with a rash she thinks it may have actually caused her symptoms so she stopped it and she has back on her meloxicam and doing well.  The rash has resolved now.  ROS      Objective:    BP (!) 128/49 (BP Location: Left Arm, Patient Position: Sitting, Cuff Size: Normal)   Pulse 81   Temp 97.7 F (36.5 C) (Oral)   Ht 5' 4.5" (1.638 m)   Wt 124 lb 1.9 oz (56.3 kg)   SpO2 98%   BMI 20.98 kg/m    Physical Exam Vitals reviewed.  Constitutional:      Appearance: Normal appearance.  HENT:     Head: Normocephalic.  Pulmonary:     Effort: Pulmonary effort is normal.  Neurological:     Mental Status: She is alert and oriented to person, place, and time.  Psychiatric:        Mood and Affect: Mood normal.        Behavior: Behavior normal.     No results found for any visits on 06/14/23.      Assessment & Plan:   Problem List Items Addressed This Visit       Other   Right wrist pain   Connective tissue disease (HCC)   I do think contributing to her pain right now.        Other Visit Diagnoses       Bilateral wrist pain    -  Primary     Medication side effect           Bilat wrist pain - she will be scheduled for nerve conduction. Continue to wear braces/gloves for support. Avoid heavy lifting, etc.     Will add Lodine to medication list.    No orders of the defined types were placed in this encounter.  I spent 20 minutes on the day  of the encounter to include pre-visit record review, face-to-face time with the patient and post visit ordering of test.   No follow-ups on file.  Nani Gasser, MD

## 2023-06-16 ENCOUNTER — Telehealth: Payer: Self-pay | Admitting: Family Medicine

## 2023-06-16 NOTE — Telephone Encounter (Signed)
 Ok, Thanks

## 2023-06-16 NOTE — Telephone Encounter (Unsigned)
 Copied from CRM [id number]. Topic: Referral - Question >> Jun 16, 2023 10:18 AM Carrielelia G wrote: Reason for CRM: Patient Robin Conrad would like to say that when considering a provider for her Neurology and Physical Therapy please make it local. She states she can not travel outside of [place].  Please advise. >> Jun 16, 2023  3:49 PM Corin V wrote: Patient is asking that she receive a call back by end of day tomorrow regarding scheduling her physical therapy and neurology referrals.

## 2023-06-16 NOTE — Telephone Encounter (Signed)
 Copied from CRM [id number]. Topic: Referral - Question >> Jun 16, 2023 10:18 AM Carrielelia G wrote: Reason for CRM: Patient Robin Conrad would like to say that when considering a provider for Robin Conrad Neurology and Physical Therapy please make it local. She states she can not travel outside of [place].  Please advise.

## 2023-06-17 NOTE — Telephone Encounter (Unsigned)
 Copied from CRM [id number]. Topic: Referral - Question >> Jun 16, 2023 10:18 AM Carrielelia G wrote: Reason for CRM: Patient Robin Conrad would like to say that when considering a provider for her Neurology and Physical Therapy please make it local. She states she can not travel outside of [place].  Please advise. >> Jun 17, 2023  1:58 PM Tiffany H wrote: Patient called to request the status of her referral. Please follow up with patient once referral(s) for neurology and physical therapy have been processed.  >> Jun 16, 2023  3:49 PM Corin V wrote: Patient is asking that she receive a call back by end of day tomorrow regarding scheduling her physical therapy and neurology referrals.

## 2023-06-21 ENCOUNTER — Telehealth: Payer: Self-pay | Admitting: Family Medicine

## 2023-06-21 NOTE — Telephone Encounter (Signed)
 Left 2 voice mails,Thanks

## 2023-06-21 NOTE — Telephone Encounter (Signed)
 Patient has an appointment 07/12/23 at 10:00am with Dr. Trena Platt. [facility] will contact patient to schedule an appointment.

## 2023-06-21 NOTE — Telephone Encounter (Signed)
 Copied from CRM [id number]. Topic: Referral - Question >> Jun 21, 2023  4:13 PM Dennison Nancy wrote: Patient received a voicemail from [facility] today  and want to know if Dr. Nani Gasser know why [facility] is calling patient  Patient callback  # [phone]

## 2023-06-21 NOTE — Telephone Encounter (Signed)
 Spoke with patient and address all her concerns,Thank you

## 2023-06-21 NOTE — Telephone Encounter (Unsigned)
 Copied from CRM [id number]. Topic: Referral - Question >> Jun 16, 2023 10:18 AM Carrielelia G wrote: Reason for CRM: Patient Robin Conrad would like to say that when considering a provider for her Neurology and Physical Therapy please make it local. She states she can not travel outside of [place].  Please advise. >> Jun 21, 2023  4:13 PM Dennison Nancy wrote: Patient received a voicemail from [facility] today  and want to know if Dr. Nani Gasser know why [facility] is calling patient  Patient callback  # [phone] >> Jun 17, 2023  1:58 PM Tiffany H wrote: Patient called to request the status of her referral. Please follow up with patient once referral(s) for neurology and physical therapy have been processed.  >> Jun 16, 2023  3:49 PM Corin V wrote: Patient is asking that she receive a call back by end of day tomorrow regarding scheduling her physical therapy and neurology referrals.

## 2023-06-22 ENCOUNTER — Other Ambulatory Visit: Payer: Self-pay | Admitting: Family Medicine

## 2023-06-22 DIAGNOSIS — M5416 Radiculopathy, lumbar region: Secondary | ICD-10-CM

## 2023-06-22 DIAGNOSIS — M797 Fibromyalgia: Secondary | ICD-10-CM

## 2023-06-22 NOTE — Telephone Encounter (Signed)
 Tramadol last written on 05/06/23 #60 with 1 refill.  Last OV 06/14/2023 Next OV 07/18/2023

## 2023-06-29 ENCOUNTER — Ambulatory Visit: Payer: Self-pay | Admitting: *Deleted

## 2023-06-29 NOTE — Telephone Encounter (Signed)
 Copied from CRM [id number]. Topic: Clinical - Red Word Triage >> Jun 29, 2023 11:09 AM Jayson Michael wrote: [facility] that prompted transfer to Nurse Triage: Patient reports new pain in the left shoulder, describing difficulty lifting the arm. She noted that taking Tramadol during the day (instead of at night) provided some relief. However, last night she experienced a strange sensation in her upper left arm, which is now swollen and painful. Reason for Disposition  Can't move injured shoulder normally (limited range of motion)  Answer Assessment - Initial Assessment Questions 1. MECHANISM: "How did the injury happen?"      I'm having pain in my left shoulder so bad I almost was crying.   I woke up Sunday morning and pulled my arm up and could not hardly move it.   It hurt so bad.    It travels down my arm.   Last night I bent my wrist back and it's swollen.    2. WHEN: "When did the injury happen?" (Minutes or hours ago)      This just happened and my left hand is swollen on top.   My shoulder pain on Sunday morning is still hurting.  3. LOCATION: "Which shoulder is injured?"     Left shoulder and left hand due to pain going down my arm.   4. APPEARANCE of INJURY: "What does the injury look like?"      It's swollen on top of my hand.  5. SEVERITY: "Can your child move the shoulder normally?" (Can reach up to the sky, out to the side)      N/A 6. SIZE: For bruises or swelling, ask: "How large is it?" (Inches or centimeters)      Swelling on top of left hand. 7. PAIN: "Is there pain?" If so, ask: "How bad is the pain?"      Yes 8. TETANUS: For any breaks in the skin, ask: "When was the last tetanus booster?"     N/A  Protocols used: Shoulder Injury-P-AH  Chief Complaint: Left shoulder and arm pain with swelling on top of left hand. Symptoms: Painful to move her shoulder when she woke up Sunday morning. Frequency: Since Sun. morning Pertinent Negatives: Patient denies injuries   Disposition:  [] ED /[] Urgent Care (no appt availability in office) / [x] Appointment(In office/virtual)/ []  [place] [facility]/ [] Home Care/ [] Refused Recommended Disposition /[] [place] [facility]/ []  Follow-up with PCP Additional Notes: Appt made with Dr. Augustus Ledger for 9:10 4/17.

## 2023-06-30 ENCOUNTER — Ambulatory Visit (INDEPENDENT_AMBULATORY_CARE_PROVIDER_SITE_OTHER): Admitting: Family Medicine

## 2023-06-30 ENCOUNTER — Encounter: Payer: Self-pay | Admitting: Family Medicine

## 2023-06-30 VITALS — BP 105/62 | HR 82 | Ht 64.5 in | Wt 122.0 lb

## 2023-06-30 DIAGNOSIS — M47812 Spondylosis without myelopathy or radiculopathy, cervical region: Secondary | ICD-10-CM

## 2023-06-30 MED ORDER — METHYLPREDNISOLONE 4 MG PO TBPK: ORAL_TABLET | ORAL | 0 refills | Status: DC
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
## 2023-06-30 NOTE — Assessment & Plan Note (Signed)
 Increased radicular symptoms on the L.  Adding medrol dosepak.  She may continue her regular medications of meloxicam and gabapentin as well as tramadol as needed.  Encouraged to contact clinic if not improving.

## 2023-07-08 ENCOUNTER — Other Ambulatory Visit: Payer: Self-pay | Admitting: Family Medicine

## 2023-07-18 ENCOUNTER — Ambulatory Visit: Payer: Medicare Other | Admitting: Family Medicine

## 2023-07-19 ENCOUNTER — Ambulatory Visit (INDEPENDENT_AMBULATORY_CARE_PROVIDER_SITE_OTHER): Admitting: Family Medicine

## 2023-07-19 ENCOUNTER — Encounter: Payer: Self-pay | Admitting: Family Medicine

## 2023-07-19 VITALS — BP 130/57 | HR 88 | Ht 64.5 in | Wt 121.1 lb

## 2023-07-19 DIAGNOSIS — M797 Fibromyalgia: Secondary | ICD-10-CM | POA: Diagnosis not present

## 2023-07-19 DIAGNOSIS — Z5181 Encounter for therapeutic drug level monitoring: Secondary | ICD-10-CM | POA: Diagnosis not present

## 2023-07-19 DIAGNOSIS — M792 Neuralgia and neuritis, unspecified: Secondary | ICD-10-CM | POA: Diagnosis not present

## 2023-07-19 DIAGNOSIS — E039 Hypothyroidism, unspecified: Secondary | ICD-10-CM | POA: Diagnosis not present

## 2023-07-19 NOTE — Assessment & Plan Note (Addendum)
 Has been having a lot of flares in her joints as well.  He is on an SSRI as well as tramadol  as needed.  Prescription should be up today.  She has a muscle relaxer to take as needed.

## 2023-07-19 NOTE — Assessment & Plan Note (Signed)
Due to recheck Thyroid

## 2023-07-19 NOTE — Progress Notes (Signed)
   Established Patient Office Visit  Subjective  Patient ID: Robin Conrad, female    DOB: 1950-06-08  Age: 73 y.o. MRN: [id number]  Chief Complaint  Patient presents with   Fibromyalgia    HPI  She is really struggling with right arm pain and numbness in the 3rd and 4th finger.  She just had a nerve conduction study yesterday doesn't know the results yet.      ROS    Objective:     BP (!) 130/57   Pulse 88   Ht 5' 4.5" (1.638 m)   Wt 121 lb 1.9 oz (54.9 kg)   SpO2 96%   BMI 20.47 kg/m    Physical Exam Vitals and nursing note reviewed.  Constitutional:      Appearance: Normal appearance.  HENT:     Head: Normocephalic and atraumatic.  Eyes:     Conjunctiva/sclera: Conjunctivae normal.  Cardiovascular:     Rate and Rhythm: Normal rate and regular rhythm.  Pulmonary:     Effort: Pulmonary effort is normal.     Breath sounds: Normal breath sounds.  Skin:    General: Skin is warm and dry.  Neurological:     Mental Status: She is alert.  Psychiatric:        Mood and Affect: Mood normal.      No results found for any visits on 07/19/23.    The 10-year ASCVD risk score (Arnett DK, et al., 2019) is: 14.5%    Assessment & Plan:   Problem List Items Addressed This Visit       Endocrine   Hypothyroid   Due to recheck Thyroid .       Relevant Orders   CMP14+EGFR   TSH     Other   Neuropathic pain   Currently on gabapentin  with Dr. Sandy Crumb.      Fibromyalgia   Has been having a lot of flares in her joints as well.  He is on an SSRI as well as tramadol  as needed.  Prescription should be up today.  She has a muscle relaxer to take as needed.      Other Visit Diagnoses       Medication monitoring encounter    -  Primary   Relevant Orders   CMP14+EGFR   TSH       Hopefully she will get the results back pretty quickly about the right arm pain does better define the condition causing the pain in the be what have of plan for care.   She is also more recently been having some pain in her left shoulder as well as some difficulty with range of motion.  I think that will have to be addressed separately.  And sounds like it is more coming from the shoulder joint itself.  Return in about 4 months (around 11/19/2023) for Fibro, Asthma.    Duaine German, MD

## 2023-07-19 NOTE — Assessment & Plan Note (Signed)
 Currently on gabapentin  with Dr. Sandy Crumb.

## 2023-07-20 ENCOUNTER — Encounter: Payer: Self-pay | Admitting: Family Medicine

## 2023-07-20 LAB — CMP14+EGFR
ALT: 24 IU/L (ref 0–32)
AST: 27 IU/L (ref 0–40)
Albumin: 4.1 g/dL (ref 3.8–4.8)
Alkaline Phosphatase: 122 IU/L — ABNORMAL HIGH (ref 44–121)
BUN/Creatinine Ratio: 20 (ref 12–28)
BUN: 19 mg/dL (ref 8–27)
Bilirubin Total: 0.3 mg/dL (ref 0.0–1.2)
CO2: 25 mmol/L (ref 20–29)
Calcium: 9.6 mg/dL (ref 8.7–10.3)
Chloride: 103 mmol/L (ref 96–106)
Creatinine, Ser: 0.96 mg/dL (ref 0.57–1.00)
Globulin, Total: 2 g/dL (ref 1.5–4.5)
Glucose: 83 mg/dL (ref 70–99)
Potassium: 4.5 mmol/L (ref 3.5–5.2)
Sodium: 142 mmol/L (ref 134–144)
Total Protein: 6.1 g/dL (ref 6.0–8.5)
eGFR: 62 mL/min/{1.73_m2} (ref >59)

## 2023-07-20 LAB — TSH: TSH: 2.05 u[IU]/mL (ref 0.450–4.500)

## 2023-07-20 NOTE — Progress Notes (Signed)
 Kidney function looks back to baseline this time which is great.  Your alkaline phosphatase just slightly elevated by 1 point.  Not in a concerning range and your additional liver enzymes look great.  Thyroid  level looks good at 2.0.

## 2023-07-26 ENCOUNTER — Telehealth: Payer: Self-pay

## 2023-07-26 NOTE — Telephone Encounter (Signed)
 I do not have them and they are not visible in Care Everywhere, please call Dr. Dolan Freiberg office to get them faxed

## 2023-07-26 NOTE — Telephone Encounter (Signed)
 Copied from CRM [id number]. Topic: Clinical - Lab/Test Results >> Jul 26, 2023  4:16 PM Robin Conrad wrote: Reason for CRM: Patient states Dr. [name] sent referred her to a neurologist to have a nerve conduction test. Patient states she had it done last week. She called the neurologist to get the results and they told her that they would sent them to Dr. [name]. Patient is wanting to get the results of the test. Please give her a call back. CB #: [phone].

## 2023-07-27 ENCOUNTER — Telehealth: Payer: Self-pay

## 2023-07-27 ENCOUNTER — Ambulatory Visit: Payer: Self-pay

## 2023-07-27 DIAGNOSIS — R200 Anesthesia of skin: Secondary | ICD-10-CM

## 2023-07-27 NOTE — Telephone Encounter (Addendum)
 Copied from CRM [id number]. Topic: Clinical - Red Word Triage >> Jul 27, 2023 12:26 PM Robin Conrad wrote: Red Word that prompted transfer to Nurse Triage: all fingers on right hand painful and with numbness  accept for pinkie finger  is not numb , the middle 2 are the worst   call back  [phone]  Per agent, pt waiting on results from neuro.  Chief Complaint: numbness and pain to hand/fingers Symptoms: ongoing numbness and pain to right hand/fingers Frequency: continual Pertinent Negatives: Patient denies worsening numbness/pain, chest pain, SOB Disposition: [] 911 / [] ED /[] Urgent Care (no appt availability in office) / [] Appointment(In office/virtual)/ []  [place] [facility]/ [] Home Care/ [] Refused Recommended Disposition /[] [place] [facility]/ [x]  Follow-up with PCP Additional Notes: Pt reporting that she recently had nerve conduction test completed with [facility] neuro, not received clear answers about results, called PCP office yesterday for assistance in getting more details, calling back now to see if update. Informed pt per messages from Dr. [name] and [name] in response to 5/13 telephone encounter, as of 9 am this morning, PCP office contacted neuro office requesting records and call back to PCP office, no update beyond that to the point. Pt verbalized understanding. Advised that pt will receive call from PCP office when they have an update. Pt reporting that she has been experiencing some improvement in symptoms but is fearful of worsening again and potential of having to live with these symptoms long-term. Reassured pt. Advised call back if further questions or worsening symptoms. Pt verbalized understanding.  Reason for Disposition  [1] Caller requesting NON-URGENT health information AND [2] PCP's office is the best resource  Answer Assessment - Initial Assessment Questions 1. REASON FOR CALL or QUESTION: "What is your reason for calling today?" or "How can I best help you?" or "What  question do you have that I can help answer?"     Had nerve conduction test with [facility] Talked to Dr. [name], said if not heard anything by Friday then call Monday, nurse from [facility] said that test is normal, pt doesn't think that's right, declined appt with neuro, didn't put it on mychart yet, nurse going to talk to doc to release it Dr. [name] is one that referred me, had the test, no one seems to be able to get me the results, called yesterday to see if could get Dr. [name] to get results, so calling back to check in Bad at beginning really bad keeping me up at night because pain in hand But over time, beginning to feel little bit better At times super painful and certain movements, had been where couldn't get fingers to stretch out but gotten them to be able to get them to  Protocols used: Information Only Call - No Triage-A-AH

## 2023-07-27 NOTE — Telephone Encounter (Signed)
 Attempted call to Dr. Rebecca Campus office. Left a detailed voice mail message on the doctor's nurse line with what we are requesting be faxed to us , our return contact information and our fax # . Asked also that someone give our office a call and let us  know that this has been sent.

## 2023-07-27 NOTE — Telephone Encounter (Signed)
 Fax received and placed in Dr. Sandy Crumb box for review.

## 2023-07-27 NOTE — Telephone Encounter (Signed)
 We received the nerve conduction study results, they are normal.  The nerve conduction study was very just to differentiate whether her numbness and tingling was radicular or related to more of a peripheral entrapment neuropathy like carpal tunnel syndrome.  It looks more like it is coming from the cervical spine considering 20% of normal nerve conduction studies will miss a cervical radiculopathy.   I also ordered a cervical epidural back in March in anticipation of a nondiagnostic nerve conduction study, but it does not look like the patient has proceeded with it.  Essentially I have already managed the situation even before her nerve conduction study but she has not proceeded with the already discussed follow-up management (epidural, cervical).  If she has any questions, this will be too complicated to discuss over messages and she would need to book either a virtual or an in person appointment to discuss the next steps.

## 2023-07-27 NOTE — Telephone Encounter (Signed)
 I media. Message sent to Dr Sandy Crumb.

## 2023-07-27 NOTE — Telephone Encounter (Signed)
 I called and requested the nerve conduction study from Neurology.     [facility])  [address]  [address]  [place], [place] [address]  [phone]

## 2023-07-27 NOTE — Telephone Encounter (Signed)
 Copied from CRM [id number]. Topic: Clinical - Lab/Test Results >> Jul 26, 2023  4:16 PM Blair Bumpers wrote: Reason for CRM: Patient states Dr. [name] sent referred her to a neurologist to have a nerve conduction test. Patient states she had it done last week. She called the neurologist to get the results and they told her that they would sent them to Dr. [name]. Patient is wanting to get the results of the test. Please give her a call back. CB #: [phone].

## 2023-07-28 NOTE — Telephone Encounter (Signed)
 Spoke with patient and informed of nerve conduction study results.  She has not heard from anyone regarding scheduling for Epidural. Called [place] [facility] scheduling and left a voice mail message for Reather Campbell to return our call regarding this epidural.  Patient already has upcoming appt scheduled with Dr. Sandy Crumb to discuss the nerve conduction study results.

## 2023-07-28 NOTE — Telephone Encounter (Signed)
 Spoke with [facility] in radiology scheduling  States there were 3 attempts to contact patient without success.  She will reach out again tomorrow  Patient can contact them directly at [phone]

## 2023-07-29 NOTE — Telephone Encounter (Signed)
 Attempted call to patient. Left a voice mail message requesting a return call.

## 2023-07-29 NOTE — Telephone Encounter (Signed)
 Yes, completely reasonable to cancel the appointment and then just see me about 4-6 weeks after the epidural injection.

## 2023-07-29 NOTE — Telephone Encounter (Signed)
 Patient called - states she is scheduled for the Epidural for May 27th  at 11:30 am  Should  would like to know if she should cancel Monday's appt to go over nerve conduction study until after this epidural is done  States the nerve conduction was reviewed with Patient by [facility] as well as Dr. Sandy Crumb now.

## 2023-08-01 ENCOUNTER — Ambulatory Visit: Admitting: Sports Medicine

## 2023-08-01 NOTE — Telephone Encounter (Signed)
 Patient informed.

## 2023-08-05 NOTE — Discharge Instructions (Signed)

## 2023-08-09 ENCOUNTER — Ambulatory Visit
Admission: RE | Admit: 2023-08-09 | Discharge: 2023-08-09 | Disposition: A | Discharge status: Home / Self Care | Source: Ambulatory Visit | Attending: Sports Medicine | Admitting: Sports Medicine

## 2023-08-09 DIAGNOSIS — M47812 Spondylosis without myelopathy or radiculopathy, cervical region: Secondary | ICD-10-CM

## 2023-08-09 MED ORDER — IOPAMIDOL (ISOVUE-M 300) INJECTION 61%
1.0000 mL | Freq: Once | INTRAMUSCULAR | Status: AC
  Administered 2023-08-09: 1 mL via EPIDURAL

## 2023-08-09 MED ORDER — TRIAMCINOLONE ACETONIDE 40 MG/ML IJ SUSP (RADIOLOGY)
60.0000 mg | Freq: Once | INTRAMUSCULAR | Status: AC
Start: 2023-08-09 — End: 2023-08-09
  Administered 2023-08-09: 60 mg via EPIDURAL

## 2023-08-26 ENCOUNTER — Other Ambulatory Visit: Payer: Self-pay

## 2023-08-26 MED ORDER — GABAPENTIN 600 MG PO TABS
1200.0000 mg | ORAL_TABLET | Freq: Two times a day (BID) | ORAL | 3 refills | Status: AC
Start: 2023-08-26 — End: ?

## 2023-09-02 ENCOUNTER — Other Ambulatory Visit: Payer: Self-pay | Admitting: Family Medicine

## 2023-09-02 DIAGNOSIS — M5416 Radiculopathy, lumbar region: Secondary | ICD-10-CM

## 2023-09-02 DIAGNOSIS — M797 Fibromyalgia: Secondary | ICD-10-CM

## 2023-09-06 ENCOUNTER — Ambulatory Visit (INDEPENDENT_AMBULATORY_CARE_PROVIDER_SITE_OTHER): Admitting: Sports Medicine

## 2023-09-06 ENCOUNTER — Ambulatory Visit: Admitting: Sports Medicine

## 2023-09-06 DIAGNOSIS — M47812 Spondylosis without myelopathy or radiculopathy, cervical region: Secondary | ICD-10-CM | POA: Diagnosis not present

## 2023-09-06 DIAGNOSIS — L03031 Cellulitis of right toe: Secondary | ICD-10-CM | POA: Diagnosis not present

## 2023-09-06 MED ORDER — DOXYCYCLINE HYCLATE 100 MG PO TABS: 100.0000 mg | ORAL_TABLET | Freq: Two times a day (BID) | ORAL | 0 refills | Status: AC

## 2023-09-06 NOTE — Progress Notes (Signed)
    Procedures performed today:    None.  Independent interpretation of notes and tests performed by another provider:   None.  Brief History, Exam, Impression, and Recommendations:    Cervical spondylosis Chronic neck pain, multilevel cervical spondylosis, she has had prior ACDF. Predominant issue is numbness and tingling of right hand and no specific dermatomal distribution, nerve conduction and EMG was normal. A cervical epidural done about a month ago did provide some relief of the numbness, repeating cervical epidural, she understands we can do 3 if need be. Continue gabapentin . If not sufficient relief after the next epidural I would suggest we get a repeat MRI, the last one was about 73 years old.  Paronychia of great toe, right There is some thing of the toenail, erythema just distal to the nail plate consistent with a hyponychia, adding doxycycline . Advised further follow-up this needs to be with her primary care provider and dermatologist.    ____________________________________________ Debby PARAS. Curtis, M.D., ABFM., CAQSM., AME. Primary Care and Sports Medicine [facility]  Adjunct Professor of [facility] Medicine  [employer]  Restaurant manager, [employer] food

## 2023-09-06 NOTE — Assessment & Plan Note (Signed)
 There is some thing of the toenail, erythema just distal to the nail plate consistent with a hyponychia, adding doxycycline . Advised further follow-up this needs to be with her primary care provider and dermatologist.

## 2023-09-06 NOTE — Assessment & Plan Note (Signed)
 Chronic neck pain, multilevel cervical spondylosis, she has had prior ACDF. Predominant issue is numbness and tingling of right hand and no specific dermatomal distribution, nerve conduction and EMG was normal. A cervical epidural done about a month ago did provide some relief of the numbness, repeating cervical epidural, she understands we can do 3 if need be. Continue gabapentin . If not sufficient relief after the next epidural I would suggest we get a repeat MRI, the last one was about 73 years old.

## 2023-09-19 ENCOUNTER — Ambulatory Visit
Admission: RE | Admit: 2023-09-19 | Discharge: 2023-09-19 | Disposition: A | Discharge status: Home / Self Care | Payer: PRIVATE HEALTH INSURANCE | Source: Ambulatory Visit | Attending: Sports Medicine | Admitting: Sports Medicine

## 2023-09-19 DIAGNOSIS — M47812 Spondylosis without myelopathy or radiculopathy, cervical region: Secondary | ICD-10-CM

## 2023-09-19 MED ORDER — IOPAMIDOL (ISOVUE-M 300) INJECTION 61%
1.0000 mL | Freq: Once | INTRAMUSCULAR | Status: AC | PRN
  Administered 2023-09-19: 1 mL via EPIDURAL

## 2023-09-19 MED ORDER — TRIAMCINOLONE ACETONIDE 40 MG/ML IJ SUSP (RADIOLOGY)
60.0000 mg | Freq: Once | INTRAMUSCULAR | Status: AC
  Administered 2023-09-19: 60 mg via EPIDURAL

## 2023-09-19 NOTE — Discharge Instructions (Signed)

## 2023-10-02 ENCOUNTER — Other Ambulatory Visit: Payer: Self-pay | Admitting: Family Medicine

## 2023-10-02 DIAGNOSIS — M797 Fibromyalgia: Secondary | ICD-10-CM

## 2023-10-02 DIAGNOSIS — M7541 Impingement syndrome of right shoulder: Secondary | ICD-10-CM

## 2023-10-02 DIAGNOSIS — N390 Urinary tract infection, site not specified: Secondary | ICD-10-CM

## 2023-10-26 ENCOUNTER — Other Ambulatory Visit: Payer: Self-pay | Admitting: Family Medicine

## 2023-10-26 DIAGNOSIS — M5416 Radiculopathy, lumbar region: Secondary | ICD-10-CM

## 2023-10-26 DIAGNOSIS — M797 Fibromyalgia: Secondary | ICD-10-CM

## 2023-10-31 ENCOUNTER — Encounter: Payer: Self-pay | Admitting: Sports Medicine

## 2023-10-31 ENCOUNTER — Ambulatory Visit (INDEPENDENT_AMBULATORY_CARE_PROVIDER_SITE_OTHER): Admitting: Sports Medicine

## 2023-10-31 DIAGNOSIS — M47812 Spondylosis without myelopathy or radiculopathy, cervical region: Secondary | ICD-10-CM

## 2023-10-31 NOTE — Assessment & Plan Note (Signed)
 This exquisitely pleasant 72 year old female returns, she has known multilevel cervical spondylosis, a prior ACDF, predominant issue was numbness and tingling right hand initially without a specific dermatomal distribution but today she reports it mostly in the middle finger. Nerve conduction and EMG were normal. Cervical epidural has been done x 2, each time it helped her, she continues with gabapentin , ordering the third cervical epidural, if at the follow-up she is not sufficiently better we will consider injecting her carpal tunnel.

## 2023-10-31 NOTE — Progress Notes (Signed)
    Procedures performed today:    None.  Independent interpretation of notes and tests performed by another provider:   None.  Brief History, Exam, Impression, and Recommendations:    Cervical spondylosis This exquisitely pleasant 73 year old female returns, she has known multilevel cervical spondylosis, a prior ACDF, predominant issue was numbness and tingling right hand initially without a specific dermatomal distribution but today she reports it mostly in the middle finger. Nerve conduction and EMG were normal. Cervical epidural has been done x 2, each time it helped her, she continues with gabapentin , ordering the third cervical epidural, if at the follow-up she is not sufficiently better we will consider injecting her carpal tunnel.    ____________________________________________ Debby PARAS. Curtis, M.D., ABFM., CAQSM., AME. Primary Care and Sports Medicine [facility]  Adjunct Professor of [facility] Medicine  [employer]  Restaurant manager, fast food

## 2023-11-02 NOTE — Discharge Instructions (Signed)

## 2023-11-03 ENCOUNTER — Ambulatory Visit
Admission: RE | Admit: 2023-11-03 | Discharge: 2023-11-03 | Disposition: A | Discharge status: Home / Self Care | Source: Ambulatory Visit | Attending: Sports Medicine | Admitting: Sports Medicine

## 2023-11-03 DIAGNOSIS — M47812 Spondylosis without myelopathy or radiculopathy, cervical region: Secondary | ICD-10-CM

## 2023-11-03 MED ORDER — IOPAMIDOL (ISOVUE-M 300) INJECTION 61%
1.0000 mL | Freq: Once | INTRAMUSCULAR | Status: AC | PRN
  Administered 2023-11-03: 1 mL via EPIDURAL

## 2023-11-03 MED ORDER — TRIAMCINOLONE ACETONIDE 40 MG/ML IJ SUSP (RADIOLOGY)
60.0000 mg | Freq: Once | INTRAMUSCULAR | Status: AC
  Administered 2023-11-03: 60 mg via EPIDURAL

## 2023-11-15 ENCOUNTER — Encounter: Payer: Self-pay | Admitting: Sports Medicine

## 2023-11-22 ENCOUNTER — Encounter: Payer: Self-pay | Admitting: Family Medicine

## 2023-11-22 ENCOUNTER — Ambulatory Visit (INDEPENDENT_AMBULATORY_CARE_PROVIDER_SITE_OTHER): Admitting: Family Medicine

## 2023-11-22 VITALS — BP 128/55 | HR 83 | Ht 64.5 in | Wt 127.0 lb

## 2023-11-22 DIAGNOSIS — J4540 Moderate persistent asthma, uncomplicated: Secondary | ICD-10-CM | POA: Diagnosis not present

## 2023-11-22 DIAGNOSIS — F411 Generalized anxiety disorder: Secondary | ICD-10-CM | POA: Diagnosis not present

## 2023-11-22 DIAGNOSIS — M797 Fibromyalgia: Secondary | ICD-10-CM

## 2023-11-22 DIAGNOSIS — N3941 Urge incontinence: Secondary | ICD-10-CM | POA: Diagnosis not present

## 2023-11-22 DIAGNOSIS — R200 Anesthesia of skin: Secondary | ICD-10-CM

## 2023-11-22 DIAGNOSIS — R202 Paresthesia of skin: Secondary | ICD-10-CM

## 2023-11-22 MED ORDER — SERTRALINE HCL 25 MG PO TABS: 25.0000 mg | ORAL_TABLET | Freq: Every day | ORAL | 3 refills | Status: DC

## 2023-11-22 MED ORDER — AMBULATORY NON FORMULARY MEDICATION: 0 refills | Status: DC

## 2023-11-22 NOTE — Progress Notes (Signed)
 Established Patient Office Visit  Subjective  Patient ID: Nysa Sarin Ringold, female    DOB: 1950/12/08  Age: 73 y.o. MRN: [id number]  Chief Complaint  Patient presents with   Medical Management of Chronic Issues    HPI  Discussed the use of AI scribe software for clinical note transcription with the patient, who gave verbal consent to proceed.  History of Present Illness Destin Kittler Bubolz is a 73 year old female with fibromyalgia and asthma who presents for a follow-up visit.  Chronic pain and fibromyalgia symptoms - Chronic pain associated with fibromyalgia, primarily at night - Tramadol  used for pain relief, primarily at night - Methocarbamol  taken as one whole tablet at night and half tablet during the day - Sertraline  50 mg taken in split doses due to side effects  Cervical radiculopathy and neuropathy symptoms - History of multilevel cervical spondylosis with prior anterior cervical discectomy and fusion (ACDF) - Persistent numbness and tingling in right hand - Nerve conduction studies and electromyography (EMG) normal - Two prior cervical epidural injections provided some relief; third epidural provided more relief than previous ones - Gabapentin  currently used for symptom management - Difficulty with activities involving hands, such as crawling and tasks requiring hand use - Wears a glove on right hand to manage symptoms, which sometimes prompts questions from others  Acute left hand pain - Recent onset of aching pain in left hand, began significantly on Saturday and has since improved - Pain localized to fingers, without associated numbness - No recent repetitive activities to account for pain  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  Constitutional:      Appearance: Normal appearance.  HENT:     Head: Normocephalic and atraumatic.  Eyes:     Conjunctiva/sclera: Conjunctivae normal.  Cardiovascular:     Rate and Rhythm: Normal rate and regular rhythm.  Pulmonary:     Effort: Pulmonary effort is normal.     Breath sounds: Normal breath sounds.  Skin:    General: Skin is warm and dry.  Neurological:     Mental Status: She is alert.  Psychiatric:        Mood and Affect: Mood normal.      No results found for any visits on 11/22/23.     The 10-year ASCVD risk score (Arnett DK, et al., 2019) is: 14.1%    Assessment & Plan:   Problem List Items Addressed This Visit       Respiratory   Moderate persistent asthma without complication - Primary     Other   Urge incontinence of urine   Relevant Orders   Ambulatory referral to Urogynecology   Numbness and tingling in both hands   GAD (generalized anxiety disorder)   She has been splitting the sertraline  so will change tab to 25mg .        Relevant Medications  sertraline  (ZOLOFT ) 25 MG tablet   Fibromyalgia   Fibromyalgia Chronic pain managed with tramadol , primarily at night and occasionally during the day if necessary. - Continue tramadol  for pain management, primarily at night.      Relevant Medications   sertraline  (ZOLOFT ) 25 MG tablet   Assessment and Plan Assessment & Plan   Asthma Discussed inclusion in conditions qualifying for COVID vaccine prescription. - Provide prescription for COVID vaccine.  Cervical spondylosis with radiculopathy, status post ACDF Persistent numbness and tingling in the right hand. Normal nerve conduction studies and  EMGs. Previous cervical epidurals provided some relief, with the last one offering more significant relief. Currently on gabapentin . - Continue gabapentin . - Consider carpal tunnel injection if symptoms do not improve.  Urinary urgency and urge incontinence with sacral neuromodulation device Issues with sacral neuromodulation device not functioning as expected. Previous provider, Dr. Teressa, has relocated. Need for referral to a urogynecologist for further management. - Refer to urogynecologist at [facility] for further management of sacral neuromodulation device issues.  GAD Managed with sertraline . She has been splitting the dose due to side effects when taken as a whole. - Prescribe sertraline  25 mg to avoid splitting tablets.  Numbness and tingling - better after last epidural.  Can refer if changes ore worse at the fall goes on.    Return in about 3 months (around 02/21/2024) for Fibrommyalgia.    Dorothyann Byars, MD

## 2023-11-22 NOTE — Assessment & Plan Note (Signed)
 She has been splitting the sertraline  so will change tab to 25mg .

## 2023-11-22 NOTE — Assessment & Plan Note (Signed)
 Fibromyalgia Chronic pain managed with tramadol , primarily at night and occasionally during the day if necessary. - Continue tramadol  for pain management, primarily at night.

## 2023-11-23 ENCOUNTER — Other Ambulatory Visit: Payer: Self-pay | Admitting: Family Medicine

## 2023-11-23 NOTE — Addendum Note (Signed)
 Addended by: Jojo Pehl D on: 11/23/2023 07:36 AM   Modules accepted: Level of Service

## 2023-12-01 ENCOUNTER — Other Ambulatory Visit: Payer: Self-pay | Admitting: Family Medicine

## 2023-12-01 ENCOUNTER — Telehealth: Payer: Self-pay

## 2023-12-01 DIAGNOSIS — E039 Hypothyroidism, unspecified: Secondary | ICD-10-CM

## 2023-12-01 DIAGNOSIS — F411 Generalized anxiety disorder: Secondary | ICD-10-CM

## 2023-12-01 DIAGNOSIS — N3941 Urge incontinence: Secondary | ICD-10-CM

## 2023-12-01 MED ORDER — SERTRALINE HCL 25 MG PO TABS: 25.0000 mg | ORAL_TABLET | Freq: Two times a day (BID) | ORAL | 3 refills | Status: DC

## 2023-12-01 NOTE — Telephone Encounter (Signed)
 Copied from CRM [id number]. Topic: Clinical - Prescription Issue >> Dec 01, 2023 12:11 PM Farrel B wrote: Reason for CRM: [phone] sertraline  (ZOLOFT ) 25 MG tablet patient is needing to discuss the dosage directions on the bottle, she is confused as to what Dr. Alvan would like her to do Please call to advise

## 2023-12-01 NOTE — Telephone Encounter (Signed)
 Spoke with patient. She states  that  she was previously taking  Sertraline  as 25mg  in am and 25mg  in PM to total 50mg   ( She was cutting the 50mg  tablet in  half at that time)   A new script for 25mg  was sent over but was only for once daily dosing  Can prescription be changed to 25mg  one tablet in am and one tablet in PM and resent to pharmacy ?

## 2023-12-01 NOTE — Telephone Encounter (Signed)
 Patient informed.

## 2023-12-01 NOTE — Telephone Encounter (Signed)
 Copied from CRM [id number]. Topic: Referral - Status >> Nov 30, 2023 12:49 PM Diannia H wrote: Reason for CRM: The Referral the patient came in for lat week, she would like to be referred at the other location which is for a UROGYNECOLOGY. At [facility] that you all discussed at her visit. Could you assist? Callback Number is [phone].

## 2023-12-01 NOTE — Telephone Encounter (Signed)
 Message sent to patient via Mychart with referral information.

## 2023-12-01 NOTE — Telephone Encounter (Signed)
 Meds ordered this encounter  Medications   sertraline  (ZOLOFT ) 25 MG tablet    Sig: Take 1 tablet (25 mg total) by mouth in the morning and at bedtime.    Dispense:  180 tablet    Refill:  3

## 2023-12-02 NOTE — Telephone Encounter (Signed)
 Patient states that she could not be seen at [facility]  until Feb 2026.  She is requesting the [facility] referral be sent as she had discussed with Dr. Alvan instead  to see If they could see her sooner.

## 2023-12-02 NOTE — Telephone Encounter (Signed)
 Copied from CRM [id number]. Topic: Referral - Question >> Dec 02, 2023 12:04 PM Miquel SAILOR wrote: Reason for CRM: Ambulatory referral to Urogynecology (Order # [id number]) on 11/22/2023-Patient requesting another PCP due to can not get seen until Feb 2026. Needs sooner ASAP. Call back  [phone]

## 2023-12-02 NOTE — Telephone Encounter (Signed)
 We can call and see how long wait is at [facility]  Can you call and find out?

## 2023-12-05 NOTE — Addendum Note (Signed)
 Addended by: Verl Kitson D on: 12/05/2023 04:31 PM   Modules accepted: Orders

## 2023-12-05 NOTE — Telephone Encounter (Signed)
 Referral was sent to [facility] - Dorothyann Ku, MD  [phone] Will call and inquire about sooner availability

## 2023-12-05 NOTE — Telephone Encounter (Signed)
 Okay, put in a new referral for urogyn with [facility].  Not sure what their wait list looks like.

## 2023-12-07 NOTE — Telephone Encounter (Signed)
 Patient informed of change of urogynecology  referral to a [place] facility. She will reach out to this facility for scheduling.  She will send us  a mychart message of phone call to let us  know the date of the upcoming appt as well.

## 2023-12-12 ENCOUNTER — Telehealth: Payer: Self-pay

## 2023-12-12 NOTE — Telephone Encounter (Signed)
 Spoke with patient. She states she was able to speak with [employer] today  And was told that her device does not work for both bladder and bowel but just bowel. ( States she was told they do not have a device for bladder)  Her previous doctor has moved to [place] - she is being sent  a list of names of recommended providers she would like dr. Alvan to review this when received and let her know if she would recommend one of them as well.   She has upcoming uro/gyn appt scheduled with [facility] provider for  Dec 29th at 10:10am

## 2023-12-12 NOTE — Telephone Encounter (Signed)
 Copied from CRM #[id number]. Topic: General - Other >> Dec 12, 2023 10:10 AM Myrick T wrote: Reason for CRM: patient called requesting a call back with help with her inner stem therapy communicator device. She also wanted to make sure her pcp is aware she has a neurogynecological appt.

## 2023-12-13 ENCOUNTER — Ambulatory Visit

## 2023-12-15 ENCOUNTER — Ambulatory Visit

## 2023-12-18 ENCOUNTER — Other Ambulatory Visit: Payer: Self-pay | Admitting: Family Medicine

## 2023-12-18 DIAGNOSIS — M797 Fibromyalgia: Secondary | ICD-10-CM

## 2024-01-08 ENCOUNTER — Other Ambulatory Visit: Payer: Self-pay | Admitting: Family Medicine

## 2024-01-09 ENCOUNTER — Ambulatory Visit (INDEPENDENT_AMBULATORY_CARE_PROVIDER_SITE_OTHER)

## 2024-01-09 ENCOUNTER — Other Ambulatory Visit: Payer: Self-pay

## 2024-01-09 VITALS — BP 128/70 | Ht 64.5 in | Wt 127.0 lb

## 2024-01-09 DIAGNOSIS — R202 Paresthesia of skin: Secondary | ICD-10-CM | POA: Diagnosis not present

## 2024-01-09 MED ORDER — METHYLPREDNISOLONE ACETATE 40 MG/ML IJ SUSP
20.0000 mg | Freq: Once | INTRAMUSCULAR | Status: AC
  Administered 2024-01-09: 20 mg via INTRA_ARTICULAR

## 2024-01-09 NOTE — Progress Notes (Signed)
   Subjective:    Patient ID: Robin Conrad, female    DOB: 73 y.o., 1950-05-19   MRN: [id number]  Chief Complaint: Cervical spondylosis follow-up  Discussed the use of AI scribe software for clinical note transcription with the patient, who gave verbal consent to proceed.  Patient previously following with Dr. Curtis and last seen on 10/31/2023.  She has known multilevel cervical spondylosis, prior ACDF, and her predominant issue was numbness and tingling of the right hand initially without a specific dermatomal distribution but eventually mostly migrated to the middle finger.  Nerve conduction and EMG studies were normal.  Received cervical epidural x 2 with modest improvement each time.  She is receiving gabapentin  and got a third  cervical epidural after her 8/18 visit.  Consider carpal tunnel injection  History of Present Illness Robin Conrad is a 73 year old female with arthritis who presents with hand pain and numbness.  Hand pain and paresthesia - Hand pain attributed to arthritis - Numbness primarily in the tips of the thumb and middle fingers - Previously, numbness affected the entire hand, impairing use of the dominant right hand - Certain movements exacerbate symptoms, causing tingling to radiate through the fingers - Daily numbness and tingling, particularly with specific wrist positions - No involvement of the small finger  Cervical spine and back symptoms - History of neck issues with two prior surgeries - Arthritis extends down the back - Previous nerve conduction study showed normal results  Use of supportive devices - Wrist braces are cumbersome and difficult to wear at night  Review of pertinent imaging: 09/21/2020 cervical spine MRI showing bilateral facet arthropathy severely at C2-3 with new central canal stenosis.  Prior C4-C7 ACDF.     Objective:   There were no vitals filed for this visit.  Cervical Spine -Inspection: no swelling or  skin changes -Palpation: TTP - paraspinals, - midline -AROM/PROM: FROM in all planes of the neck -Strength: full neck flexion/extension (C1/C2), neck sidebending (C3), shoulder shrug (C4), shoulder abduction (C5), elbow flexion (C6), elbow extension (C7), thumb extension (C8/radial), finger abduction (T1/ulnar), OK sign (median) -Sensation: intact sensation of the thumb (C6), 2nd/3rd digits (C7), 4th/5th digits (C8). -Reflexes: normal biceps (C5/6), brachioradialis (C5/6), triceps (C7/8) reflexes, equal bilaterally  Right wrist: Positive Tinel's.  Positive Durkan's, positive Phalen's.   Limited ultrasound examination of the right wrist: Median nerve cross-sectional area 12 mm Median nerve also flattened in appearance  Interpretation: Mild carpal tunnel syndrome    Assessment & Plan:   Assessment & Plan Right carpal tunnel syndrome   She experiences numbness and tingling in the fingers, especially the thumb and middle fingers, with improvement from whole hand involvement. Ultrasound reveals an enlarged median nerve, indicating mild to moderate carpal tunnel syndrome. Normal nerve conduction studies suggest compression higher in the neck. Prefers injection over a wrist brace due to discomfort. Administer a carpal tunnel injection in the right wrist. Reassess symptoms in 4-6 weeks to determine if a repeat injection is necessary.  Primary osteoarthritis of the right hand   Chronic hand pain affects her ability to perform tasks, particularly as she is right-hand dominant. Arthritis likely contributes to overall hand discomfort.  Cervical spondylosis At this point, I believe that she has had a significant clinical benefit from the epidural steroid injections.  Continue home exercise program.  Follow-up carpal tunnel injection to determine if tingling in hand is from C-spine or carpal tunnel.

## 2024-01-17 ENCOUNTER — Ambulatory Visit: Admitting: Family Medicine

## 2024-01-17 ENCOUNTER — Other Ambulatory Visit: Payer: Self-pay | Admitting: Family Medicine

## 2024-01-17 DIAGNOSIS — M5416 Radiculopathy, lumbar region: Secondary | ICD-10-CM

## 2024-01-17 DIAGNOSIS — M797 Fibromyalgia: Secondary | ICD-10-CM

## 2024-01-27 ENCOUNTER — Telehealth: Payer: Self-pay

## 2024-01-27 NOTE — Telephone Encounter (Signed)
 Copied from CRM [id number]. Topic: Clinical - Medical Advice >> Jan 27, 2024 11:27 AM Wess RAMAN wrote: Reason for CRM: Patient is confused on how to take her sertraline  (ZOLOFT ) 25 MG tablet. She stated she has been taking it incorrectly and believes she is missing medication due to having a low amount. She is unsure.  Callback #: [id number]

## 2024-01-27 NOTE — Telephone Encounter (Signed)
 Spoke w/pt and she stated that she didn't realize that she was to take 2 tablets daily.  Pt thought that she would run out.   GAD Managed with sertraline . She has been splitting the dose due to side effects when taken as a whole. - Prescribe sertraline  25 mg to avoid splitting tablets.

## 2024-02-01 ENCOUNTER — Ambulatory Visit
Admission: EM | Admit: 2024-02-01 | Discharge: 2024-02-01 | Disposition: A | Discharge status: Home / Self Care | Attending: Family Medicine | Admitting: Family Medicine

## 2024-02-01 ENCOUNTER — Ambulatory Visit: Admitting: Family Medicine

## 2024-02-01 ENCOUNTER — Other Ambulatory Visit: Payer: Self-pay

## 2024-02-01 ENCOUNTER — Ambulatory Visit: Payer: Self-pay

## 2024-02-01 ENCOUNTER — Ambulatory Visit

## 2024-02-01 DIAGNOSIS — M25472 Effusion, left ankle: Secondary | ICD-10-CM

## 2024-02-01 DIAGNOSIS — M25572 Pain in left ankle and joints of left foot: Secondary | ICD-10-CM | POA: Diagnosis not present
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
## 2024-02-01 NOTE — ED Triage Notes (Signed)
 Left ankle swelling x couple of days. No fall or injury. No otc meds.

## 2024-02-01 NOTE — Discharge Instructions (Addendum)
 Advised patient of left ankle x-ray results with hardcopy and images provided.  Advised patient if symptoms worsen and/or unresolved please follow-up with your PCP or [facility] for further evaluation of pain and swelling of left ankle.

## 2024-02-01 NOTE — Telephone Encounter (Signed)
 FYI Only or Action Required?: FYI only for provider: No appt available today that pt can make d/t obligations. Advised UC today.  Patient was last seen in primary care on 11/22/2023 by [name], MD.  Called Nurse Triage reporting Joint Swelling.  Symptoms began several days ago.  Interventions attempted: OTC medications: Tylenol  and voltaren  and Prescription medications: Tramadol , robaxin , mobic .  Symptoms are: gradually worsening.  Triage Disposition: See HCP Within 4 Hours (Or PCP Triage)  Patient/caregiver understands and will follow disposition?: Yes  Copied from CRM [id number]. Topic: Clinical - Red Word Triage >> Feb 01, 2024 12:17 PM Antwanette L wrote: Red Word that prompted transfer to Nurse Triage: The patient reports swelling and pain in the left ankle. Reason for Disposition  [1] Thigh, calf, or ankle swelling AND [2] only 1 side  (Exceptions: Itchy, localized swelling; swelling is chronic.)  Answer Assessment - Initial Assessment Questions Pt reports onset on moderate swelling in left ankle a few days ago. 5/10 pain only when moving ankle. No recent trauma to ankle. Denies fever, redness, CP or SOB. Offered to schedule appt today, pt declines d/t conflicts with pt schedule. Advised UC later today, pt states she could likely go later today. Advised UC or ED for worsening symptoms.   1. LOCATION: Which ankle is swollen? Where is the swelling?     Left ankle  2. ONSET: When did the swelling start?     A few days ago  3. SWELLING: How bad is the swelling? Or, How large is it? (e.g., mild, moderate, severe; size of localized swelling)      Moderate  4. PAIN: Is there any pain? If Yes, ask: How bad is it? (Scale 0-10; or none, mild, moderate, severe)     No pain at rest, only pain when moving or bend ankle. 5/10 sore pain. Able to walk.  5. CAUSE: What do you think caused the ankle swelling?     Pt reports chronic hx of having bad veins in both  ankles and feet  6. OTHER SYMPTOMS: Do you have any other symptoms? (e.g., fever, chest pain, difficulty breathing, calf pain)     Denies redness, cp, sob or pain in calf. Denies numbness or tingling.  Protocols used: Ankle Swelling-A-AH

## 2024-02-02 ENCOUNTER — Telehealth: Payer: Self-pay | Admitting: Family Medicine

## 2024-02-02 ENCOUNTER — Telehealth: Payer: Self-pay

## 2024-02-02 ENCOUNTER — Ambulatory Visit: Payer: Self-pay

## 2024-02-02 DIAGNOSIS — F411 Generalized anxiety disorder: Secondary | ICD-10-CM

## 2024-02-02 MED ORDER — SERTRALINE HCL 50 MG PO TABS: 50.0000 mg | ORAL_TABLET | Freq: Two times a day (BID) | ORAL | 1 refills | Status: AC

## 2024-02-02 NOTE — Telephone Encounter (Signed)
 Copied from CRM [id number]. Topic: Clinical - Prescription Issue >> Feb 02, 2024 11:32 AM Dedra NOVAK wrote: Reason for CRM: Randie, pharmacist from [facility], said pt picked up a prescription for sertraline  25 mg 1 tablet every day. Pt said it should've been 50 mg 2 tablets BID. Please provide the pharmacy with clarification.

## 2024-02-02 NOTE — Telephone Encounter (Signed)
 FYI Only or Action Required?: Action required by provider: Med clarification.  Patient was last seen in primary care on 11/22/2023 by [name], MD.  Called Nurse Triage reporting Medication Problem.  Symptoms began n/a.  Interventions attempted: Other: n/a.  Symptoms are: n/a.  Triage Disposition: Call PCP Now  Patient/caregiver understands and will follow disposition?: Yes Reason for Disposition  [1] Pharmacy calling with prescription question AND [2] triager unable to answer question  Answer Assessment - Initial Assessment Questions Patient states she wants clarification before her next refill. Pharmacy has it down for 1 daily. Med chart states 1 25mg  once in the morning and once at bedtime. Please advise.   1. NAME of MEDICINE: What medicine(s) are you calling about?     Robin  (ZOLOFT ) 25 MG tablet  2. QUESTION: What is your question? (e.g., double dose of medicine, side effect)     Patient wanted clarification on dosage, patient states pharmacy is confused.  3. PRESCRIBER: Who prescribed the medicine? Reason: if prescribed by specialist, call should be referred to that group.     [name], MD  4. SYMPTOMS: Do you have any symptoms? If Yes, ask: What symptoms are you having?  How bad are the symptoms (e.g., mild, moderate, severe)     N/A  Protocols used: Medication Question Call-A-AH  Copied from CRM #[id number]. Topic: Clinical - Medication Question >> Feb 02, 2024 11:34 AM Robin Conrad wrote: Reason for CRM: pt is calling about her Robin  25 mg 1 tab 25 mg, hosp shows RX doesnt need ot be cut in half and will get a new RX.  Robin  (ZOLOFT ) 25 MG tablet. Pt needs to know what the correct dosage is CB [phone]

## 2024-02-02 NOTE — Telephone Encounter (Signed)
 Copied from CRM [id number]. Topic: Referral - Request for Referral >> Feb 02, 2024 11:27 AM Victoria B wrote: Did the patient discuss referral with their provider in the last year? No  Type of order/referral and detailed reason for visit: mri for left ankle Patient spoje with NT and was told to go to urgent care and then was told at urgent care that she needs an mri done Preference of office, provider, location: ?  If referral order, have you been seen by this specialty before? no  Can we respond through MyChart? yes

## 2024-02-02 NOTE — Telephone Encounter (Signed)
 O, new rx sent

## 2024-02-02 NOTE — Telephone Encounter (Signed)
 Message sent to provider in separate message. This is duplicate

## 2024-02-02 NOTE — Telephone Encounter (Signed)
 Spoke with patient - states E2C2 message below was not correct from pharmacy and prescription resent to pharmacy is not correct.    she states that the sertraline  in the past was being split and taken one in a.m and one in p.m.  as she did not handle the full tablet at one time well but at last visit  dr. Alvan had stated that she could help make this better and patient would not have to half the tablets.  Form OV dated 11/22/2023 =Managed with sertraline . She has been splitting the dose due to side effects when taken as a whole. - Prescribe sertraline  25 mg to avoid splitting tablets. Patient states she thinks the prescription should be 25mg  taking one in a.m. and one in pm - to total 50mg  .   Requesting new script be sent and verification from Dr. Alvan that this is correct.   I told the patient that I would contact her again once this has been resolved.

## 2024-02-03 ENCOUNTER — Telehealth: Payer: Self-pay

## 2024-02-03 ENCOUNTER — Other Ambulatory Visit: Payer: Self-pay

## 2024-02-03 DIAGNOSIS — F411 Generalized anxiety disorder: Secondary | ICD-10-CM

## 2024-02-03 MED ORDER — SERTRALINE HCL 25 MG PO TABS: 25.0000 mg | ORAL_TABLET | Freq: Two times a day (BID) | ORAL | 2 refills | Status: DC

## 2024-02-03 NOTE — Telephone Encounter (Signed)
 [facility] pharmacy informed to cancel refill attached to sertraline  50mg  -  New prescription for  sertraline  25mg  BID sent to pharmacy to fill after she completes the 50mg  that has already picked up . ( She will cut these in half  and take 1/2 in a.m. and 1/2 in pm )

## 2024-02-03 NOTE — Telephone Encounter (Signed)
 Patient states she was seen at urgent care for ankle swelling on 02/01/2024  Ankle x-ray results IMPRESSION: 1. Mild diffuse soft tissue swelling without evidence of acute fracture or dislocation. 2. Distortion of Kager's fat pad which may be secondary to the presence of a joint effusion. MRI correlation is recommended.  Was recommended  by UC to either follow up with Dr. Alvan or Dr. Dimple .   She would like to know which office Dr. Alvan thought she should schedule the follow up with ?

## 2024-02-03 NOTE — Telephone Encounter (Signed)
 Appt to be made with Dr. Gottwolt. Spoke w/pt regarding this.

## 2024-02-03 NOTE — Telephone Encounter (Signed)
 Pt to be scheduled w/Dr. Gottwolt for appt.

## 2024-02-03 NOTE — Telephone Encounter (Signed)
 I am so confused.   That is exactly what I sent on 9/18, it was 25mg  BID, #180 with 3 RF ( for a year) .  So was that wrong?  That is why I changed to to 50mg  BID.    Then please call the pharmayc and cancel the 50mg  and have them fill the 25mg  BID Rx

## 2024-02-03 NOTE — Telephone Encounter (Signed)
 Spoke with patient informed her that she should be taking the sertraline  as 25mg   BID  She states she had just picked up the 50mg   prscription  She will half these tablets until they run out and then will pick up the correct prescription at the pharmacy.   Attempt to call [facility] pharmacy to cancel refills attached to 50mg  prescription , but pharmacy was closed.  Will attempt to call again at later time.

## 2024-02-06 ENCOUNTER — Other Ambulatory Visit: Payer: Self-pay

## 2024-02-06 ENCOUNTER — Ambulatory Visit

## 2024-02-06 VITALS — BP 110/60 | Ht 65.5 in | Wt 127.0 lb

## 2024-02-06 DIAGNOSIS — M7672 Peroneal tendinitis, left leg: Secondary | ICD-10-CM

## 2024-02-06 DIAGNOSIS — M25472 Effusion, left ankle: Secondary | ICD-10-CM | POA: Diagnosis not present

## 2024-02-06 DIAGNOSIS — M7752 Other enthesopathy of left foot: Secondary | ICD-10-CM | POA: Diagnosis not present

## 2024-02-06 DIAGNOSIS — M25572 Pain in left ankle and joints of left foot: Secondary | ICD-10-CM

## 2024-02-06 MED ORDER — METHYLPREDNISOLONE ACETATE 40 MG/ML IJ SUSP
40.0000 mg | Freq: Once | INTRAMUSCULAR | Status: AC
  Administered 2024-02-06: 40 mg via INTRA_ARTICULAR

## 2024-02-06 NOTE — Progress Notes (Signed)
 Subjective:    Patient ID: Robin Conrad, female    DOB: 73 y.o., [birth year]   MRN: [id number]  Chief Complaint: Left ankle pain  Discussed the use of AI scribe software for clinical note transcription with the patient, who gave verbal consent to proceed.  History of Present Illness 73 year old female with past medical history significant for asthma, IBS, GERD, hypothyroidism, osteoporosis, depression presenting with atraumatic left ankle pain.   Robin Conrad is a 73 year old female who presents with ankle pain and swelling.  Ankle pain and swelling - Onset of significant right ankle swelling and pain since last Monday (approximately 7 days ago) - Swelling prompted urgent care visit by Wednesday due to interference with daily activities - Swelling has decreased since onset, but pain persists - Pain radiates up the leg - Pain worsens with pressure or touch, with a stinging sensation upon palpation of certain areas - Cold exposure exacerbates soreness - No popping sensation in the ankle - Current pain is different and more intense than baseline vein-related leg discomfort - Concerned about ongoing symptoms affecting participation in upcoming family gatherings  Review of pertinent imaging: Three-view plain from radiographs obtained of the left ankle per my independent evaluation review revealing soft tissue swelling surrounding the ankle particularly posteriorly with a convexity noted in the soft tissue of the posterior ankle.  Well-preserved ankle mortise.  No acute osseous abnormalities.     Objective:   There were no vitals filed for this visit.  Left Ankle ( compared to normal ) Inspection: Minimal to mild swelling present over the left ankle compared to the contralateral side.  No increased erythema.  No bruising.  Numerous varicose veins. There is tenderness palpation of the anterior joint line of the ankle.  Tenderness  and pain with palpation of the retrocalcaneal bursa. The Achilles tendon is nontender. The peroneal and posterior tibialis tendons are nontender.  Extensor digitorum, anterior tibialis, extensor houses longus are nontender.   Limited ultrasound evaluation of the left ankle: There is a prominent hypoechogenic fluid signal present within the tibiotalar joint.  Mild hypoechogenic signal present within the sheath of the peroneus brevis tendon. The posterior tibialis tendon is well-visualized and appears normal. There is significant hypoechogenic fluid signal present within the retrocalcaneal bursa. The Achilles tendon is well-visualized and appears normal. Cobblestoning present superficial to the anterior joint line Impression: Prominent retrocalcaneal bursitis Tibiotalar joint effusion Mild peroneal tendinitis  Left tibiotalar joint injection with Ultrasound Guidance Procedure Note Robin Conrad 11-19-1950 Indications: Pain Procedure Details Following the description of risks including infection, bleeding, and damage to surrounding structures patient provided written consent for left tibiotalar injection with ultrasound guidance. Ultrasound was used to identify the left tibiotalar joint. The patient was then prepped in the usual fashion with chlorhexidine. Following topical anesthetization with ethyl chloride, the patient was injected into the left tibiotalar joint with a solution of Depo . This was well visualized under ultrasound, please see associated photographic documentation. Patient tolerated well without complication. Precautions provided. Cleaned and dressing applied.      Assessment & Plan:   Assessment & Plan Left ankle joint osteoarthritis with effusion   Acute flare with joint space narrowing and fluid accumulation confirmed by X-ray and ultrasound. Administered ankle joint injection for management. Reassess in one week to evaluate response.  Left retrocalcaneal  bursitis   Inflammation with fluid accumulation, likely secondary to ankle joint inflammation and significantly contributing to symptoms. Consider bursa injection if symptoms persist after  ankle joint injection.  Left peroneal tendinitis   Mild inflammation contributing to discomfort but not the primary source of symptoms.

## 2024-02-12 NOTE — Progress Notes (Unsigned)
   Subjective:    Patient ID: Robin Conrad, female    DOB: 73 y.o., [birth year]   MRN: [id number]  Chief Complaint: Left ankle osteoarthritis, retrocalcaneal bursitis, peroneal tendinitis follow-up (2 weeks)  Discussed the use of AI scribe software for clinical note transcription with the patient, who gave verbal consent to proceed.  History of Present Illness Patient previously evaluated by myself on 02/02/2024 for pain in her left ankle joint which was significant and started approximately 1 week prior to my evaluation without known inciting event.  X-rays were suggestive of retrocalcaneal bursitis and/or Achilles tendinopathy with a convexity in the soft tissues posteriorly.  Otherwise unremarkable.  She is provided with a corticosteroid injection in her tibiotalar joint as this was felt to represent the primary source of her pain.  Recommended follow-up for consideration of retrocalcaneal injection if pain persists.  Today she reports***      Objective:   There were no vitals filed for this visit.  Left ankle: ***     Assessment & Plan:   Assessment & Plan

## 2024-02-13 ENCOUNTER — Ambulatory Visit

## 2024-02-13 VITALS — BP 127/82 | Ht 64.0 in | Wt 126.0 lb

## 2024-02-13 DIAGNOSIS — M19072 Primary osteoarthritis, left ankle and foot: Secondary | ICD-10-CM | POA: Diagnosis not present

## 2024-02-13 DIAGNOSIS — M25572 Pain in left ankle and joints of left foot: Secondary | ICD-10-CM

## 2024-02-13 DIAGNOSIS — M7752 Other enthesopathy of left foot: Secondary | ICD-10-CM | POA: Diagnosis not present

## 2024-02-13 DIAGNOSIS — M7672 Peroneal tendinitis, left leg: Secondary | ICD-10-CM | POA: Diagnosis not present

## 2024-02-17 ENCOUNTER — Ambulatory Visit

## 2024-02-17 ENCOUNTER — Ambulatory Visit
Admission: EM | Admit: 2024-02-17 | Discharge: 2024-02-17 | Disposition: A | Discharge status: Home / Self Care | Attending: Internal Medicine | Admitting: Internal Medicine

## 2024-02-17 DIAGNOSIS — M79641 Pain in right hand: Secondary | ICD-10-CM | POA: Diagnosis not present

## 2024-02-17 DIAGNOSIS — M25531 Pain in right wrist: Secondary | ICD-10-CM

## 2024-02-17 NOTE — ED Triage Notes (Addendum)
 Pt c/o RT hand injury since Wed. Says she fell going up some steps and caught herself with RT hand/wrist. Takes gabapentin  and tramadol  prn.

## 2024-02-17 NOTE — Discharge Instructions (Signed)
 Your hand and wrist are not broken.  Apply ice intermittently.  Follow-up with orthopedist if pain persists or worsens.
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
## 2024-02-18 ENCOUNTER — Telehealth: Payer: Self-pay

## 2024-02-23 ENCOUNTER — Ambulatory Visit: Admitting: Family Medicine

## 2024-02-23 ENCOUNTER — Encounter: Payer: Self-pay | Admitting: Family Medicine

## 2024-02-23 VITALS — BP 118/58 | HR 94 | Ht 64.0 in | Wt 127.0 lb

## 2024-02-23 DIAGNOSIS — M79672 Pain in left foot: Secondary | ICD-10-CM | POA: Diagnosis not present

## 2024-02-23 DIAGNOSIS — F330 Major depressive disorder, recurrent, mild: Secondary | ICD-10-CM | POA: Insufficient documentation

## 2024-02-23 DIAGNOSIS — M2042 Other hammer toe(s) (acquired), left foot: Secondary | ICD-10-CM

## 2024-02-23 DIAGNOSIS — M797 Fibromyalgia: Secondary | ICD-10-CM

## 2024-02-23 DIAGNOSIS — R748 Abnormal levels of other serum enzymes: Secondary | ICD-10-CM

## 2024-02-23 DIAGNOSIS — M79671 Pain in right foot: Secondary | ICD-10-CM | POA: Diagnosis not present

## 2024-02-23 DIAGNOSIS — I83813 Varicose veins of bilateral lower extremities with pain: Secondary | ICD-10-CM | POA: Insufficient documentation

## 2024-02-23 DIAGNOSIS — J4540 Moderate persistent asthma, uncomplicated: Secondary | ICD-10-CM

## 2024-02-23 DIAGNOSIS — M2041 Other hammer toe(s) (acquired), right foot: Secondary | ICD-10-CM

## 2024-02-23 DIAGNOSIS — L853 Xerosis cutis: Secondary | ICD-10-CM | POA: Diagnosis not present

## 2024-02-23 DIAGNOSIS — E039 Hypothyroidism, unspecified: Secondary | ICD-10-CM

## 2024-02-23 DIAGNOSIS — Z13220 Encounter for screening for lipoid disorders: Secondary | ICD-10-CM

## 2024-02-23 NOTE — Assessment & Plan Note (Signed)
 Stable.  No recent flares.

## 2024-02-23 NOTE — Assessment & Plan Note (Signed)
 Well controlled on Zoloft.

## 2024-02-23 NOTE — Assessment & Plan Note (Signed)
 No recent changes.  Uses robaxin  and meloxicam .  Also on gabapenting.

## 2024-02-23 NOTE — Patient Instructions (Signed)
 Can get [employer] or [employer]  compression socks , knee high Start with 15-20 pressure, can work up to the 20-25 if needed.

## 2024-02-23 NOTE — Progress Notes (Signed)
 Established Patient Office Visit  Patient ID: Robin Conrad, female    DOB: 1951-02-17  Age: 73 y.o. MRN: [id number] PCP: [name], MD  Chief Complaint  Patient presents with   Medical Management of Chronic Issues    Subjective:     HPI  Discussed the use of AI scribe software for clinical note transcription with the patient, who gave verbal consent to proceed.  History of Present Illness Robin Conrad is a 73 year old female with arthritis and fibromyalgia who presents with hand and ankle pain following a fall.  Hand pain and swelling - Acute onset of pain, redness, and swelling in the right hand following a fall while ascending narrow, high steps. - X-rays showed no fracture; diagnosed with a sprain and significant underlying arthritis. - Received an injection in the hand without pain during administration. - wearing a compression sleeve  Ankle pain and swelling - Swelling in the ankle after the same fall event. - X-rays at urgent care showed no fracture. - Received an injection in the ankle, which initially caused pain but resulted in significant improvement by the next visit.  Chronic musculoskeletal pain - History of fibromyalgia contributing to chronic pain experience. - Underlying arthritis noted in the hand.  Foot pain and deformity - Persistent pain in the toes, even when wearing supportive tennis shoes. - Presence of hammer toes and painful toenails. - Dermatologist has not addressed toenail pain. - Considering podiatry evaluation for further assessment.  Varicose vein pain - Significant discomfort in the legs due to varicose veins. - Painful to touch. - Interested in exploring treatment options for varicose veins.  Dermatologic symptoms - Dry, flaky skin on the legs, worsened by pool use. - Uses moisturizing soap and is considering bath oils for improved skin hydration.  Visual disturbances post-cataract surgery - History of  cataract surgery with ongoing visual disturbances. - Continues to wear glasses due to astigmatism. - Concerned about under-eye circles, attributed to allergies.  Allergic symptoms - Receives allergy  shots every two weeks. - Occasional rough-sounding cough.   Flowsheet Row Office Visit from 02/23/2024 in [facility] at [facility]  PHQ-9 Total Score 2       ROS    Objective:     BP (!) 118/58   Pulse 94   Ht 5' 4 (1.626 m)   Wt 127 lb (57.6 kg)   SpO2 97%   BMI 21.80 kg/m     Physical Exam Vitals and nursing note reviewed.  Constitutional:      Appearance: Normal appearance.  HENT:     Head: Normocephalic and atraumatic.  Eyes:     Conjunctiva/sclera: Conjunctivae normal.  Cardiovascular:     Rate and Rhythm: Normal rate and regular rhythm.  Pulmonary:     Effort: Pulmonary effort is normal.     Breath sounds: Normal breath sounds.  Musculoskeletal:     Comments: Bruising on palm of right hand.   Skin:    General: Skin is warm and dry.     Comments: Varicose and spider veins with trace swelling on both loer legs and ankles.   Neurological:     Mental Status: She is alert.  Psychiatric:        Mood and Affect: Mood normal.      No results found for any visits on 02/23/24.    The 10-year ASCVD risk score (Arnett DK, et al., 2019) is: 12.2%    Assessment &  Plan:   Problem List Items Addressed This Visit       Cardiovascular and Mediastinum   Varicose veins of both lower extremities with pain - Primary   Relevant Orders   Ambulatory referral to Vascular Surgery     Respiratory   Moderate persistent asthma without complication   Stable.  No recent flares.         Endocrine   Hypothyroid   Relevant Orders   Lipid Panel With LDL/HDL Ratio   TSH     Other   MDD (major depressive disorder), recurrent episode, mild   Well controlled on Zoloft .       Fibromyalgia   No recent changes.  Uses  robaxin  and meloxicam .  Also on gabapenting.        Other Visit Diagnoses       Dry skin dermatitis         Elevated alkaline phosphatase level       Relevant Orders   Lipid Panel With LDL/HDL Ratio   CMP14+EGFR     Screening, lipid       Relevant Orders   Lipid Panel With LDL/HDL Ratio     Hammertoes of both feet       Relevant Orders   Ambulatory referral to Podiatry     Bilateral foot pain       Relevant Orders   Ambulatory referral to Podiatry       Assessment and Plan Assessment & Plan Right hand and ankle osteoarthritis with recent sprain and injury Recent fall caused sprain; X-rays showed no fractures but significant arthritis. Injections provided relief. - Continue injections as needed for pain relief.  Hammer toes and painful toenails Pain in toes, especially big toe, with hammer toes and painful toenails. Previous dermatologist did not address. - Referred to podiatrist for evaluation and management.  Chronic venous insufficiency Pain and tenderness in legs, especially around ankles. Compression stockings recommended; discussed surgical options if needed. - Recommended 15-20 mmHg compression stockings during the day. - Referred to vein and vascular specialist, Dr. Christopher Hink, for further evaluation and management.  Fibromyalgia Chronic condition with high pain threshold. Current management includes medications for pain relief.  General Health Maintenance Up to date on vaccinations including flu and COVID. Discussed importance of staying current. - Continue routine health maintenance and vaccinations as recommended.    Return in about 3 months (around 06/06/2024) for Fibromyalgia .   I spent 40 minutes on the day of the encounter to include pre-visit record review, face-to-face time with the patient and post visit ordering of test.  Dorothyann Byars, MD [facility] at [facility]

## 2024-02-24 ENCOUNTER — Ambulatory Visit: Payer: Self-pay | Admitting: Family Medicine

## 2024-02-24 LAB — CMP14+EGFR
ALT: 30 IU/L (ref 0–32)
AST: 35 IU/L (ref 0–40)
Albumin: 4 g/dL (ref 3.8–4.8)
Alkaline Phosphatase: 122 IU/L (ref 49–135)
BUN/Creatinine Ratio: 23 (ref 12–28)
BUN: 24 mg/dL (ref 8–27)
Bilirubin Total: 0.4 mg/dL (ref 0.0–1.2)
CO2: 25 mmol/L (ref 20–29)
Calcium: 9.7 mg/dL (ref 8.7–10.3)
Chloride: 103 mmol/L (ref 96–106)
Creatinine, Ser: 1.05 mg/dL — ABNORMAL HIGH (ref 0.57–1.00)
Globulin, Total: 2.1 g/dL (ref 1.5–4.5)
Glucose: 102 mg/dL — ABNORMAL HIGH (ref 70–99)
Potassium: 4.2 mmol/L (ref 3.5–5.2)
Sodium: 142 mmol/L (ref 134–144)
Total Protein: 6.1 g/dL (ref 6.0–8.5)
eGFR: 56 mL/min/1.73 — ABNORMAL LOW (ref >59)

## 2024-02-24 LAB — LIPID PANEL WITH LDL/HDL RATIO
Cholesterol, Total: 171 mg/dL (ref 100–199)
HDL: 73 mg/dL (ref >39)
LDL Chol Calc (NIH): 83 mg/dL (ref 0–99)
LDL/HDL Ratio: 1.1 ratio (ref 0.0–3.2)
Triglycerides: 80 mg/dL (ref 0–149)
VLDL Cholesterol Cal: 15 mg/dL (ref 5–40)

## 2024-02-24 LAB — TSH: TSH: 3.34 u[IU]/mL (ref 0.450–4.500)

## 2024-02-24 NOTE — Progress Notes (Signed)
 Your lab work is within acceptable range and there are no concerning findings.   ?

## 2024-02-28 ENCOUNTER — Ambulatory Visit: Payer: Self-pay | Admitting: *Deleted

## 2024-02-28 NOTE — Telephone Encounter (Signed)
 The patient has been scheduled on 03/01/24 at 240 pm with the provider to discuss the following symptoms.

## 2024-02-28 NOTE — Telephone Encounter (Signed)
 FYI Only or Action Required?: FYI only for provider: appointment scheduled on 03/01/24.  Patient was last seen in primary care on 02/23/2024 by [name], MD.  Called Nurse Triage reporting Foot Swelling. Leg pain  Symptoms began several days ago.  Interventions attempted: Rest, hydration, or home remedies and Other: elevate feet.  Symptoms are: gradually worsening.  Triage Disposition: See PCP When Office is Open (Within 3 Days)  Patient/caregiver understands and will follow disposition?: Yes         Copied from CRM #[id number]. Topic: Clinical - Red Word Triage >> Feb 28, 2024 11:35 AM Avram MATSU wrote: Red Word that prompted transfer to Nurse Triage: swelling in left foot and both feet hurts a lot. She was given a shot but it has not helped. Hurts all the way up her leg. Reason for Disposition  MODERATE ankle swelling (e.g., interferes with normal activities, can't move joint normally) (Exceptions: Itchy, localized swelling; swelling is chronic.)  Answer Assessment - Initial Assessment Questions Earliest appt scheduled with PCP 03/01/24. Recommended if sx worsen go to ED.       1. LOCATION: Which ankle is swollen? Where is the swelling?     Both ankle swelling left more than right  2. ONSET: When did the swelling start?     Few days  3. SWELLING: How bad is the swelling? Or, How large is it? (e.g., mild, moderate, severe; size of localized swelling)      Worse this am but better now.  4. PAIN: Is there any pain? If Yes, ask: How bad is it? (Scale 0-10; or none, mild, moderate, severe)     Moderate to severe. Pain in legs to touch ache pain pins and needles 5. CAUSE: What do you think caused the ankle swelling?     Not sure  6. OTHER SYMPTOMS: Do you have any other symptoms? (e.g., fever, chest pain, difficulty breathing, calf pain)     Bilateral inner ankle swelling , veins enlarged on ankles and up legs. Pain bilateral legs to touch.  Swelling worsening in am. No chest pain no difficulty breathing no fever.  7. PREGNANCY: Is there any chance you are pregnant? When was your last menstrual period?     na  Protocols used: Ankle Swelling-A-AH

## 2024-03-01 ENCOUNTER — Ambulatory Visit (INDEPENDENT_AMBULATORY_CARE_PROVIDER_SITE_OTHER): Admitting: Family Medicine

## 2024-03-01 ENCOUNTER — Encounter: Payer: Self-pay | Admitting: Family Medicine

## 2024-03-01 VITALS — BP 134/78 | HR 90 | Ht 64.0 in | Wt 128.9 lb

## 2024-03-01 DIAGNOSIS — R600 Localized edema: Secondary | ICD-10-CM

## 2024-03-01 MED ORDER — HYDROCHLOROTHIAZIDE 25 MG PO TABS: 25.0000 mg | ORAL_TABLET | Freq: Every day | ORAL | 0 refills | Status: AC | PRN

## 2024-03-01 NOTE — Progress Notes (Signed)
 Acute Office Visit  Patient ID: Robin Conrad, female    DOB: [birth year], 73 y.o.   MRN: [id number]  PCP: [name], MD  Chief Complaint  Patient presents with   Leg Swelling    Subjective:     HPI  Discussed the use of AI scribe software for clinical note transcription with the patient, who gave verbal consent to proceed.  History of Present Illness Robin Conrad is a 73 year old female who presents with bilateral leg swelling.  Peripheral edema - Bilateral leg swelling, more pronounced in the evening - Both legs appear purple - Swelling decreases slightly overnight but returns by evening - Significant pain upon standing in the morning - Difficulty ambulating to the kitchen in the morning  Cardiopulmonary symptoms - No history of heart problems - No chest pain - No shortness of breath  Dietary factors - No significant increase in salt intake - Occasional consumption of salty foods during family gatherings - Adequate hydration  Dermatologic symptoms - Dry skin after bathing - Uses body oils and lotions for management  Specialist care - Upcoming appointment with vein specialist on December 30th  Medication changes - No changes in medication regimen   ROS     Objective:    BP 134/78   Pulse 90   Ht 5' 4 (1.626 m)   Wt 128 lb 14.4 oz (58.5 kg)   SpO2 99%   BMI 22.13 kg/m    Physical Exam Vitals and nursing note reviewed.  Constitutional:      Appearance: Normal appearance.  HENT:     Head: Normocephalic and atraumatic.  Eyes:     Conjunctiva/sclera: Conjunctivae normal.  Cardiovascular:     Rate and Rhythm: Normal rate and regular rhythm.  Pulmonary:     Effort: Pulmonary effort is normal.     Breath sounds: Normal breath sounds.  Musculoskeletal:     Comments: Trace edema of both ankles and feet.  Tender on exam.  DP pulse is 2+ bilat   Skin:    General: Skin is warm and dry.  Neurological:     Mental Status:  She is alert.  Psychiatric:        Mood and Affect: Mood normal.       No results found for any visits on 03/01/24.     Assessment & Plan:   Problem List Items Addressed This Visit   None Visit Diagnoses       Bilateral lower extremity edema    -  Primary   Relevant Medications   hydrochlorothiazide  (HYDRODIURIL ) 25 MG tablet   Other Relevant Orders   CMP14+EGFR   CBC with Differential/Platelet   Urine Microalbumin w/creat. ratio       Assessment and Plan Assessment & Plan Bilateral lower extremity edema Symmetric edema suggests systemic cause. Cardiac causes unlikely due to absence of symptoms. Possible renal, liver dysfunction, or venous stasis. Increased sodium intake may contribute. - Order blood tests for renal function, liver enzymes, electrolytes. - Recommend compression stockings ([employer] or [employer], knee-high, 15-20 mmHg or 20-25 mmHg). - Advise low sodium diet. - Recommend leg elevation several times daily for 15 minutes. - Prescribe hydrochlorothiazide  25 mg daily as needed. - Follow up with vein specialist on March 13, 2024.    Meds ordered this encounter  Medications   hydrochlorothiazide  (HYDRODIURIL ) 25 MG tablet    Sig: Take 1 tablet (25 mg total) by mouth daily as needed.    Dispense:  20  tablet    Refill:  0    Return if symptoms worsen or fail to improve.  Dorothyann Byars, MD [facility] at [facility]

## 2024-03-01 NOTE — Progress Notes (Signed)
 Pt stated that the swelling began 6 days ago. She has been trying to elevate her legs and drink more water. She stated that her legs hurt.

## 2024-03-02 ENCOUNTER — Ambulatory Visit: Payer: Self-pay | Admitting: Family Medicine

## 2024-03-02 ENCOUNTER — Telehealth: Payer: Self-pay

## 2024-03-02 DIAGNOSIS — R748 Abnormal levels of other serum enzymes: Secondary | ICD-10-CM

## 2024-03-02 LAB — CMP14+EGFR
ALT: 37 IU/L — ABNORMAL HIGH (ref 0–32)
AST: 41 IU/L — ABNORMAL HIGH (ref 0–40)
Albumin: 4.2 g/dL (ref 3.8–4.8)
Alkaline Phosphatase: 120 IU/L (ref 49–135)
BUN/Creatinine Ratio: 14 (ref 12–28)
BUN: 13 mg/dL (ref 8–27)
Bilirubin Total: 0.3 mg/dL (ref 0.0–1.2)
CO2: 26 mmol/L (ref 20–29)
Calcium: 10 mg/dL (ref 8.7–10.3)
Chloride: 100 mmol/L (ref 96–106)
Creatinine, Ser: 0.94 mg/dL (ref 0.57–1.00)
Globulin, Total: 2 g/dL (ref 1.5–4.5)
Glucose: 89 mg/dL (ref 70–99)
Potassium: 4.4 mmol/L (ref 3.5–5.2)
Sodium: 140 mmol/L (ref 134–144)
Total Protein: 6.2 g/dL (ref 6.0–8.5)
eGFR: 64 mL/min/1.73

## 2024-03-02 LAB — CBC WITH DIFFERENTIAL/PLATELET
Basophils Absolute: 0.1 x10E3/uL (ref 0.0–0.2)
Basos: 2 %
EOS (ABSOLUTE): 0.2 x10E3/uL (ref 0.0–0.4)
Eos: 4 %
Hematocrit: 39.1 % (ref 34.0–46.6)
Hemoglobin: 12.9 g/dL (ref 11.1–15.9)
Immature Grans (Abs): 0 x10E3/uL (ref 0.0–0.1)
Immature Granulocytes: 0 %
Lymphocytes Absolute: 1.6 x10E3/uL (ref 0.7–3.1)
Lymphs: 32 %
MCH: 31.4 pg (ref 26.6–33.0)
MCHC: 33 g/dL (ref 31.5–35.7)
MCV: 95 fL (ref 79–97)
Monocytes Absolute: 0.4 x10E3/uL (ref 0.1–0.9)
Monocytes: 8 %
Neutrophils Absolute: 2.7 x10E3/uL (ref 1.4–7.0)
Neutrophils: 54 %
Platelets: 212 x10E3/uL (ref 150–450)
RBC: 4.11 x10E6/uL (ref 3.77–5.28)
RDW: 11.8 % (ref 11.7–15.4)
WBC: 5 x10E3/uL (ref 3.4–10.8)

## 2024-03-02 LAB — MICROALBUMIN / CREATININE URINE RATIO
Creatinine, Urine: 36.5 mg/dL
Microalb/Creat Ratio: 30 mg/g{creat} — ABNORMAL HIGH (ref 0–29)
Microalbumin, Urine: 11 ug/mL

## 2024-03-02 NOTE — Progress Notes (Signed)
 Hi Robin Conrad, great news kidney function actually looks a little bit better than it did a week ago but your liver enzymes are up so that is very interesting.  I know you said there were no new major changes.  I would like to recheck your liver again in a couple weeks but also maybe get an ultrasound on your liver if you are okay with that please let us  know and we will get you scheduled.  Blood count is normal.  Urine sample still pending.

## 2024-03-02 NOTE — Telephone Encounter (Signed)
 Spoke with patient - answered questions.  Orders for repeat CMP placed in patient chart  for return in couple of weeks  Patient would like to proceed with Liver Ultrasound.

## 2024-03-02 NOTE — Telephone Encounter (Signed)
 Copied from CRM #[id number]. Topic: Clinical - Lab/Test Results >> Mar 02, 2024 10:14 AM Tinnie BROCKS wrote: Reason for CRM: Pt returning call for lab results. Results relayed. She would like to go ahead with liver ultrasound. She is concerned about this and kidney function, requesting a call back from a nurse at [phone]

## 2024-03-05 ENCOUNTER — Other Ambulatory Visit: Payer: Self-pay | Admitting: Family Medicine

## 2024-03-05 DIAGNOSIS — M5416 Radiculopathy, lumbar region: Secondary | ICD-10-CM

## 2024-03-05 DIAGNOSIS — M797 Fibromyalgia: Secondary | ICD-10-CM

## 2024-03-05 DIAGNOSIS — R748 Abnormal levels of other serum enzymes: Secondary | ICD-10-CM

## 2024-03-05 NOTE — Progress Notes (Signed)
 Last read by Eva Ganja Oesterling at 10:57AM on 03/02/2024.

## 2024-03-05 NOTE — Progress Notes (Signed)
 [place]

## 2024-03-05 NOTE — Progress Notes (Signed)
 You are spilling just a teeny bit of protein in the urine off by 1 point.  So I do want a keep an eye on that and plan to recheck again in 6 months.

## 2024-03-09 ENCOUNTER — Telehealth: Payer: Self-pay

## 2024-03-09 NOTE — Telephone Encounter (Signed)
 attempted to leave a VM to remind patient of new patient appointment on Monday. VM box was full.

## 2024-03-11 ENCOUNTER — Other Ambulatory Visit: Payer: Self-pay | Admitting: Family Medicine

## 2024-03-11 DIAGNOSIS — R600 Localized edema: Secondary | ICD-10-CM

## 2024-03-12 ENCOUNTER — Ambulatory Visit: Payer: Self-pay | Admitting: Obstetrics

## 2024-03-12 ENCOUNTER — Ambulatory Visit: Admitting: Obstetrics

## 2024-03-12 ENCOUNTER — Encounter: Payer: Self-pay | Admitting: Obstetrics

## 2024-03-12 VITALS — BP 126/78 | HR 88 | Ht 63.78 in | Wt 126.0 lb

## 2024-03-12 DIAGNOSIS — R159 Full incontinence of feces: Secondary | ICD-10-CM | POA: Diagnosis not present

## 2024-03-12 DIAGNOSIS — N952 Postmenopausal atrophic vaginitis: Secondary | ICD-10-CM

## 2024-03-12 DIAGNOSIS — R102 Pelvic and perineal pain unspecified side: Secondary | ICD-10-CM | POA: Diagnosis not present

## 2024-03-12 DIAGNOSIS — Z87440 Personal history of urinary (tract) infections: Secondary | ICD-10-CM | POA: Diagnosis not present

## 2024-03-12 DIAGNOSIS — Z9682 Presence of neurostimulator: Secondary | ICD-10-CM | POA: Insufficient documentation

## 2024-03-12 DIAGNOSIS — N3941 Urge incontinence: Secondary | ICD-10-CM

## 2024-03-12 DIAGNOSIS — R339 Retention of urine, unspecified: Secondary | ICD-10-CM

## 2024-03-12 LAB — POCT URINALYSIS DIP (CLINITEK)
Bilirubin, UA: NEGATIVE
Blood, UA: NEGATIVE
Glucose, UA: NEGATIVE mg/dL
Ketones, POC UA: NEGATIVE mg/dL
Leukocytes, UA: NEGATIVE
Nitrite, UA: NEGATIVE
POC PROTEIN,UA: NEGATIVE
Spec Grav, UA: 1.015
Urobilinogen, UA: 0.2 U/dL
pH, UA: 6.5

## 2024-03-12 NOTE — Assessment & Plan Note (Addendum)
-   Interstim removal and replacement in 06/03/2020, program 3 at 1.4 changed to 1.78mA in 10/2020 by Dr. Teressa.  - battery check completed - denies falls or trauma - continue re-programming with [employer] as needed - We discussed the role of sacral neuromodulation and how it works. The battery lasts around 10 years on average and would need to be replaced surgically.  The goal of this therapy is at least a 50% improvement in symptoms. It is NOT realistic to expect a 100% cure.

## 2024-03-12 NOTE — Assessment & Plan Note (Addendum)
-   POCT UA negative, PVR - reviewed urinary retention/overflow urinary incontinence, reports minimal urinary leakage - unclear history of bladder tack, no mesh or foreign material noted on exam. Pain with palpation of pelvic floor muscles R > L and midline at puborectalis along posterior vaginal wall - discussed possible neuropathic in origin due to prior resolution of urinary and fecal symptoms after spine surgery - discussed treatments include pelvic floor PT, reprogramming of SNM, clean intermittent catheterization, indwelling foley, or referral to urology for reconstructive surgery. Patient declines foley or CIC.  - increased interstim to program 3 at 1.57mA. Denies pain and reports mild stimulation - care coordination with interstim to switch programs and reassess relief via phone - referral to pelvic floor PT - encouraged pelvic floor relaxation and optimization of stool consistency due to association with constipation - cannot offer OAB medications or treatments until urinary retention resolves - consider urodynamics

## 2024-03-12 NOTE — Assessment & Plan Note (Addendum)
-   For symptomatic vaginal atrophy options include lubrication with a water-based lubricant, personal hygiene measures and barrier protection against wetness, and estrogen replacement in the form of vaginal cream, vaginal tablets, or a time-released vaginal ring.   - patient reports vaginal estrogen Rx at home, declines Rx for vaginal estrogen until 2026 when she determines new pharmacy. Pt to call back
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
## 2024-03-12 NOTE — Assessment & Plan Note (Addendum)
-   history of IBS-C managed by Dr. San (GI), desires to re-establish care with referral placed - Treatment options include anti-diarrhea medication (loperamide/ Imodium OTC or prescription lomotil ), fiber supplements, physical therapy, and possible sacral neuromodulation or surgery.   - referral for pelvic floor PT, last in 2021 - encouraged to resume titration of fiber supplementation and miralax - Interstim at program 3, 1.48mA. Continue re-programming via phone with [employer] as needed

## 2024-03-12 NOTE — Assessment & Plan Note (Addendum)
-   POCT UA negative, PVR - We discussed the symptoms of overactive bladder (OAB), which include urinary urgency, urinary frequency, nocturia, with or without urge incontinence.  While we do not know the exact etiology of OAB, several treatment options exist. We discussed management including behavioral therapy (decreasing bladder irritants, urge suppression strategies, timed voids, bladder retraining), physical therapy, medication; for refractory cases posterior tibial nerve stimulation, sacral neuromodulation, and intravesical botulinum toxin injection.  For anticholinergic medications, we discussed the potential side effects of anticholinergics including dry eyes, dry mouth, constipation, cognitive impairment and urinary retention. For Beta-3 agonist medication, we discussed the potential side effect of elevated blood pressure which is more likely to occur in individuals with uncontrolled hypertension. - reviewed urinary retention/overflow urinary incontinence and inability to start medication or 3rd line therapy until bladder emptying improves - unclear history of bladder tack, no mesh or foreign material noted on exam. Consider urodynamics if able to void - referral to pelvic floor PT due to pelvic floor myofascial pain - continue reprogramming of SNM, changed interstim to program 3 at 1.45mA. Denies pain and reports mild stimulation, continue to switch programs and reassess relief via phone with medtronic - declined clean intermittent catheterization, indwelling foley, or referral to urology for reconstructive surgery at this time - encouraged bladder diary due to difficulty with history

## 2024-03-12 NOTE — Assessment & Plan Note (Signed)
-   For treatment of recurrent urinary tract infections, we discussed management of recurrent UTIs including prophylaxis with a daily low dose antibiotic, transvaginal estrogen therapy, D-mannose, and cranberry supplements.  We discussed the role of diagnostic testing such as cystoscopy and upper tract imaging.   - encouraged to resume low dose vaginal estrogen - discussed need to consider discontinuation of daily macrobid  and risk of pulm complications after 3 months of vaginal estrogen

## 2024-03-12 NOTE — Assessment & Plan Note (Addendum)
-   fibromyalgia with bilateral pelvic floor myofascial pain on exam - history of pelvic floor PT in 2021 for pelvic floor dysfunction - The origin of pelvic floor muscle spasm can be multifactorial, including primary, reactive to a different pain source, trauma, or even part of a centralized pain syndrome.Treatment options include pelvic floor physical therapy, local (vaginal) or oral  muscle relaxants, pelvic muscle trigger point injections or centrally acting pain medications.   - referral to pelvic floor PT

## 2024-03-12 NOTE — Patient Instructions (Addendum)
 Please call [phone] to schedule the earliest appointment for pelvic floor PT.  Accidental Bowel Leakage:  - Treatment options include anti-diarrhea medication (loperamide/ Imodium OTC or prescription lomotil ), fiber supplements, physical therapy, and possible sacral neuromodulation or surgery.   Constipation: Our goal is to achieve formed bowel movements daily or every-other-day.  You may need to try different combinations of the following options to find what works best for you - everybody's body works differently so feel free to adjust the dosages as needed.  Some options to help maintain bowel health include:  Dietary changes (more leafy greens, vegetables and fruits; less processed foods) Fiber supplementation (Benefiber, FiberCon, Metamucil or Psyllium). Start slow and increase gradually to full dose. Over-the-counter agents such as: stool softeners (Docusate or Colace) and/or laxatives (Miralax, milk of magnesia)  Power Pudding is a natural mixture that may help your constipation.  To make blend 1 cup applesauce, 1 cup wheat bran, and 3/4 cup prune juice, refrigerate and then take 1 tablespoon daily with a large glass of water as needed.   Women should try to eat at least 21 to 25 grams of fiber a day, while men should aim for 30 to 38 grams a day. You can add fiber to your diet with food or a fiber supplement such as psyllium (metamucil), benefiber, or fibercon.   Here's a look at how much dietary fiber is found in some common foods. When buying packaged foods, check the Nutrition Facts label for fiber content. It can vary among brands.  Fruits Serving size Total fiber (grams)*  Raspberries 1 cup 8.0  Pear 1 medium 5.5  Apple, with skin 1 medium 4.5  Banana 1 medium 3.0  Orange 1 medium 3.0  Strawberries 1 cup 3.0   Vegetables Serving size Total fiber (grams)*  Green peas, boiled 1 cup 9.0  Broccoli, boiled 1 cup chopped 5.0  Turnip greens, boiled 1 cup 5.0  Brussels  sprouts, boiled 1 cup 4.0  Potato, with skin, baked 1 medium 4.0  Sweet corn, boiled 1 cup 3.5  Cauliflower, raw 1 cup chopped 2.0  Carrot, raw 1 medium 1.5   Grains Serving size Total fiber (grams)*  Spaghetti, whole-wheat, cooked 1 cup 6.0  Barley, pearled, cooked 1 cup 6.0  Bran flakes 3/4 cup 5.5  Quinoa, cooked 1 cup 5.0  Oat bran muffin 1 medium 5.0  Oatmeal, instant, cooked 1 cup 5.0  Popcorn, air-popped 3 cups 3.5  Brown rice, cooked 1 cup 3.5  Bread, whole-wheat 1 slice 2.0  Bread, rye 1 slice 2.0   Legumes, nuts and seeds Serving size Total fiber (grams)*  Split peas, boiled 1 cup 16.0  Lentils, boiled 1 cup 15.5  Black beans, boiled 1 cup 15.0  Baked beans, canned 1 cup 10.0  Chia seeds 1 ounce 10.0  Almonds 1 ounce (23 nuts) 3.5  Pistachios 1 ounce (49 nuts) 3.0  Sunflower kernels 1 ounce 3.0  *Rounded to nearest 0.5 gram. Source: [employer] for [employer], [employer] [place]    We discussed the symptoms of overactive bladder (OAB), which include urinary urgency, urinary frequency, night-time urination, with or without urge incontinence.  We discussed management including behavioral therapy (decreasing bladder irritants by following a bladder diet, urge suppression strategies, timed voids, bladder retraining), physical therapy, medication; and for refractory cases posterior tibial nerve stimulation, sacral neuromodulation, and intravesical botulinum toxin injection.   For night time frequency: - avoid fluid intake 3 hours before bedtime - switch your  diuretic (e.g. hydrochlorothiazide ) dosing to 2pm  Please contact [employer] regarding re-programming, keep it at program 3 at 1.7 for 1 week.   Please call regarding pharmacy desired for vaginal estrogen prescription  For vaginal atrophy (thinning of the vaginal tissue that can cause dryness and burning) and UTI prevention we discussed estrogen replacement in the form of vaginal cream.   Start  vaginal estrogen therapy nightly for two weeks then 2 times weekly at night. This can be placed with your finger or an applicator inside the vagina and around the urethra.  Please let us  know if the prescription is too expensive and we can look for alternative options.   Is vaginal estrogen therapy safe for me? Vaginal estrogen preparations act on the vaginal skin, and only a very tiny amount is absorbed into the bloodstream (0.01%).  They work in a similar way to hand or face cream.  There is minimal absorption and they are therefore perfectly safe. If you have had breast cancer and have persistent troublesome symptoms which aren't settling with vaginal moisturisers and lubricants, local estrogen treatment may be a possibility, but consultation with your oncologist should take place first.

## 2024-03-14 ENCOUNTER — Telehealth: Payer: Self-pay

## 2024-03-14 NOTE — Telephone Encounter (Signed)
 Also, did you want her on vaginal estrogen? No Rx was sent for this.I see the notes where the pt wanted to wait until 2026, but now wants the medication.

## 2024-03-14 NOTE — Telephone Encounter (Signed)
 Patient called stating she has called [facility] to have appointment scheduled, but no referral has been placed. Please send over the referral.  Thank you

## 2024-03-16 ENCOUNTER — Other Ambulatory Visit: Payer: Self-pay | Admitting: Obstetrics

## 2024-03-16 DIAGNOSIS — R339 Retention of urine, unspecified: Secondary | ICD-10-CM

## 2024-03-16 DIAGNOSIS — R102 Pelvic and perineal pain unspecified side: Secondary | ICD-10-CM

## 2024-03-16 DIAGNOSIS — N3941 Urge incontinence: Secondary | ICD-10-CM

## 2024-03-16 DIAGNOSIS — R159 Full incontinence of feces: Secondary | ICD-10-CM

## 2024-03-16 NOTE — Progress Notes (Signed)
 Referral to pelvic floor PT for urinary retention, mixed urinary incontinence, fecal incontinence, pelvic pain Start vaginal estrogen, patient has Rx at home.

## 2024-03-19 ENCOUNTER — Other Ambulatory Visit

## 2024-03-21 NOTE — Telephone Encounter (Signed)
 Patients voicemail is full. Called x 2

## 2024-03-26 ENCOUNTER — Encounter: Payer: Self-pay | Admitting: *Deleted

## 2024-03-26 ENCOUNTER — Ambulatory Visit

## 2024-03-26 ENCOUNTER — Ambulatory Visit: Payer: Self-pay | Admitting: Family Medicine

## 2024-03-26 DIAGNOSIS — R748 Abnormal levels of other serum enzymes: Secondary | ICD-10-CM

## 2024-03-26 NOTE — Progress Notes (Signed)
 HI Kaliegh, Your US  shows increased fat in the liver. Treatment is to work on [employer] and exercise.  No other lesions though, which is good. Also you do have some kidney stones, but they are not causing a problem right now

## 2024-03-29 ENCOUNTER — Telehealth: Payer: Self-pay

## 2024-03-29 ENCOUNTER — Other Ambulatory Visit: Payer: Self-pay

## 2024-03-29 DIAGNOSIS — K219 Gastro-esophageal reflux disease without esophagitis: Secondary | ICD-10-CM

## 2024-03-29 NOTE — Telephone Encounter (Signed)
 Copied from CRM [id number]. Topic: General - Other >> Mar 29, 2024 11:06 AM Larissa RAMAN wrote: Reason for CRM: Patient requesting pharmacy change due to insurance. [facility] [address] and [address].

## 2024-03-30 ENCOUNTER — Telehealth: Payer: Self-pay | Admitting: Family Medicine

## 2024-03-30 NOTE — Telephone Encounter (Signed)
 03/30/2024-Left message on patients voicemail regarding insurance. Patient has [facility] Medicare PPO. Preferred Provider Organization's do not usually require referrals.   Spoke with Ms. Bohman, explained her insurance benefits. Will double check outside providers to make sure no Prior Auth is required.

## 2024-03-30 NOTE — Telephone Encounter (Signed)
 Copied from CRM [id number]. Topic: Referral - Question >> Mar 29, 2024 11:04 AM Larissa RAMAN wrote: Reason for CRM: Patient states that she was informed via her insurance provider, [facility], that they are now requiring referral and pre authorizations for all doctors outside of PCP. She is requesting a callback to determine next steps.

## 2024-04-03 NOTE — Telephone Encounter (Signed)
 No return communications. Will await return call.

## 2024-04-05 ENCOUNTER — Encounter: Payer: Self-pay | Admitting: Podiatry

## 2024-04-05 ENCOUNTER — Ambulatory Visit: Admitting: Podiatry

## 2024-04-05 ENCOUNTER — Telehealth: Payer: Self-pay | Admitting: *Deleted

## 2024-04-05 ENCOUNTER — Ambulatory Visit

## 2024-04-05 DIAGNOSIS — M76821 Posterior tibial tendinitis, right leg: Secondary | ICD-10-CM

## 2024-04-05 DIAGNOSIS — M7750 Other enthesopathy of unspecified foot: Secondary | ICD-10-CM | POA: Diagnosis not present

## 2024-04-05 DIAGNOSIS — M76822 Posterior tibial tendinitis, left leg: Secondary | ICD-10-CM

## 2024-04-05 MED ORDER — METHYLPREDNISOLONE 4 MG PO TBPK: ORAL_TABLET | ORAL | 0 refills | Status: AC
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
## 2024-04-05 NOTE — Patient Instructions (Signed)
 Posterior Tibial Tendinitis Rehab Ask your health care provider which exercises are safe for you. Do exercises exactly as told by your provider and adjust them as directed. It's normal to feel mild stretching, pulling, tightness, or discomfort as you do these exercises. Stop right away if you feel sudden pain or your pain gets worse. Do not begin these exercises until told by your provider. Stretching and range-of-motion exercises These exercises warm up your muscles and joints and improve the movement and flexibility in your ankle and foot. These exercises may also help to relieve pain. Standing wall calf stretch, knee straight  Stand with your hands against a wall. Extend your left / right leg behind you, and bend your front knee slightly. If told, place a folded washcloth under the arch of your foot for support. Point the toes of your back foot slightly inward. Keeping your heels on the floor and your back knee straight, shift your weight toward the wall. Do not allow your back to arch. You should feel a gentle stretch in your upper left / right calf. Hold this position for __________ seconds. Repeat __________ times. Complete this exercise __________ times a day. Standing wall calf stretch, knee bent  Stand with your hands against a wall. Extend your left / right leg behind you, and bend your front knee slightly. If told, place a folded washcloth under the arch of your foot for support. Point the toes of your back foot slightly inward. Unlock your back knee so it's bent. Keep your heels on the floor. You should feel a gentle stretch deep in your lower left / right calf. Hold this position for __________ seconds. Repeat __________ times. Complete this exercise __________ times a day. Strengthening exercises These exercises build strength and endurance in your ankle and foot. Endurance is the ability to use your muscles for a long time, even after they get tired. Ankle inversion with  band  Secure one end of a rubber exercise band or tubing to a fixed object, such as a table leg or a pole, that will stay still when the band is pulled. Loop the other end of the band around the middle of your left / right foot. Sit on the floor facing the object with your left / right leg extended. The band or tube should be slightly tense when your foot is relaxed. Leading with your big toe, slowly bring your left / right foot and ankle inward, toward your other foot (inversion). Hold this position for __________ seconds. Slowly return your foot to the starting position. Repeat __________ times. Complete this exercise __________ times a day. Towel curls  Sit in a chair on a non-carpeted surface, and put your feet on the floor. Place a towel in front of your feet. If told by your provider, add a __________ weight to the end of the towel. Keeping your heel on the floor, put your left / right foot on the towel. Pull the towel toward you by grabbing the towel with your toes and curling them under. Keep your heel on the floor while you do this. Let your toes relax. Grab the towel with your toes again. Keep going until the towel is completely underneath your foot. Repeat __________ times. Complete this exercise __________ times a day. Balance exercise This exercise improves or maintains your balance. Balance is important in preventing falls. Single leg stand  Without wearing shoes, stand near a railing or in a doorway. You may hold on to the railing or  doorframe as needed for balance. Stand on your left / right foot. Keep your big toe down on the floor and try to keep your arch lifted. If balancing in this position is too easy, try the exercise with your eyes closed or while standing on a pillow. Hold this position for __________ seconds. Repeat __________ times. Complete this exercise __________ times a day. This information is not intended to replace advice given to you by your health care  provider. Make sure you discuss any questions you have with your health care provider. Document Revised: 03/03/2022 Document Reviewed: 03/03/2022 Elsevier Patient Education  2024 [employer].

## 2024-04-05 NOTE — Telephone Encounter (Signed)
 I called and asked the patient to stop by imaging for xrays of her feet and ankles before she comes to us  for her appointment.

## 2024-04-06 ENCOUNTER — Telehealth: Payer: Self-pay | Admitting: Podiatry

## 2024-04-06 ENCOUNTER — Other Ambulatory Visit: Payer: Self-pay | Admitting: Family Medicine

## 2024-04-06 DIAGNOSIS — N390 Urinary tract infection, site not specified: Secondary | ICD-10-CM

## 2024-04-06 DIAGNOSIS — M797 Fibromyalgia: Secondary | ICD-10-CM

## 2024-04-06 DIAGNOSIS — M7541 Impingement syndrome of right shoulder: Secondary | ICD-10-CM

## 2024-04-06 NOTE — Telephone Encounter (Signed)
 Sydney a nurse with [facility] called and said that the patient contacted them because they had gotten a brace from us  but cannot figure out how to put it back on or use it correctly. Sydney asked if someone could reach back out to the patient and explain how to use the brace or have her be set up to come into the office to be shown how to put it on in person.

## 2024-04-06 NOTE — Telephone Encounter (Signed)
 [facility] [place] [facility] Requesting rx rf of methocarbimol 500mg  Last written 10/04/2023 And  Nitrofurantoin  100mg  ( could not pend prescription)  Does not show in patient current med list  Last written 10/04/2023 Note on prescription refill request from pharmacy :  The original prescription was discontinued on 02/01/2024 by Teddy Sharper, FNP for the following reason: Completed Course. Renewing this prescription may not be appropriate.   Spoke with patient.  And was told that she does not know why this was showing d/c - she states that Dr. Alvan had put on this preventatively for frequent UTI's She states Dr,Yuen had also stated to there that this should be a short term medication.  She wanted to relay to Dr. Parnell that she does not like Dr. Ossie. States she has been on this medication now for two years.   Last OV 03/01/2024 Upcoming appt 04/24/2024 AWV

## 2024-04-06 NOTE — Telephone Encounter (Signed)
 Meds sent.  But please let patient know that typically when we put people on daily prophylaxis we do not do it forever usually we do it for period of time to kind of sort of reset the urinary tract.  I did go ahead and refill a 90-day supply so maybe she and I can talk about it at the next follow-up and have an exit strategy for this medication.

## 2024-04-06 NOTE — Telephone Encounter (Signed)
 Attempted to contact the patient - left a voicemail message with my direct # for return call.

## 2024-04-09 ENCOUNTER — Other Ambulatory Visit: Payer: Self-pay

## 2024-04-09 DIAGNOSIS — M797 Fibromyalgia: Secondary | ICD-10-CM

## 2024-04-09 DIAGNOSIS — N390 Urinary tract infection, site not specified: Secondary | ICD-10-CM

## 2024-04-09 DIAGNOSIS — M7541 Impingement syndrome of right shoulder: Secondary | ICD-10-CM

## 2024-04-09 MED ORDER — METHOCARBAMOL 500 MG PO TABS: ORAL_TABLET | ORAL | 1 refills | Status: AC

## 2024-04-09 MED ORDER — NITROFURANTOIN MONOHYD MACRO 100 MG PO CAPS: 100.0000 mg | ORAL_CAPSULE | Freq: Every day | ORAL | 0 refills | Status: AC

## 2024-04-09 NOTE — Telephone Encounter (Signed)
 Just FYI.

## 2024-04-09 NOTE — Telephone Encounter (Signed)
 Spoke with patient informed of message from Dr. Alvan.  Moved prescriptions to correct pharmacy at time of call.

## 2024-04-09 NOTE — Telephone Encounter (Signed)
 Attempted call to patient. Left a voice mail message requesting a return call.

## 2024-04-09 NOTE — Telephone Encounter (Signed)
 Spoke with patient- [facility] is the [facility] that is now preferred by insurance. Cancelled scripts at [facility] and [facility] . Patient has been informed of Dr. Alvan 's message and will discuss this with her at her next appt.

## 2024-04-11 ENCOUNTER — Other Ambulatory Visit: Payer: Self-pay | Admitting: *Deleted

## 2024-04-11 MED ORDER — ESTRADIOL 0.01 % VA CREA: 1.0000 | TOPICAL_CREAM | VAGINAL | 12 refills | Status: AC

## 2024-04-18 ENCOUNTER — Other Ambulatory Visit: Payer: Self-pay

## 2024-04-18 DIAGNOSIS — M797 Fibromyalgia: Secondary | ICD-10-CM

## 2024-04-18 DIAGNOSIS — M5416 Radiculopathy, lumbar region: Secondary | ICD-10-CM

## 2024-04-18 MED ORDER — TRAMADOL HCL 50 MG PO TABS: ORAL_TABLET | ORAL | 1 refills | Status: AC

## 2024-04-18 NOTE — Telephone Encounter (Signed)
 Patient informed.

## 2024-04-18 NOTE — Telephone Encounter (Signed)
 Copied from CRM #[id number]. Topic: Clinical - Medication Refill >> Apr 18, 2024 12:17 PM Delon T wrote: Medication: traMADol  (ULTRAM ) 50 MG tablet  Has the patient contacted their pharmacy? Yes (Agent: If no, request that the patient contact the pharmacy for the refill. If patient does not wish to contact the pharmacy document the reason why and proceed with request.) (Agent: If yes, when and what did the pharmacy advise?)  This is the patient's preferred pharmacy:  [phone] [place] [place] [facility] [address] [place] [address] Phone: [phone] Fax: [phone]  Is this the correct pharmacy for this prescription? Yes If no, delete pharmacy and type the correct one.   Has the prescription been filled recently? Yes  Is the patient out of the medication? Yes  Has the patient been seen for an appointment in the last year OR does the patient have an upcoming appointment? Yes  Can we respond through MyChart? Yes  Agent: Please be advised that Rx refills may take up to 3 business days. We ask that you follow-up with your pharmacy.

## 2024-04-18 NOTE — Telephone Encounter (Signed)
 Requesting rx rf of Tramadol  50mg   Last written 03/06/2024 Last OV 03/01/2024 Upcoming appt 04/24/2024 AWV

## 2024-04-18 NOTE — Telephone Encounter (Signed)
 Spoke with patient. She states she can not use [place] pharmacy any longer due to insurance. She is [facility] [place].

## 2024-04-18 NOTE — Telephone Encounter (Signed)
 Thre tramadol  had a refill on it for Jan. Did she pick that onup yet

## 2024-04-24 ENCOUNTER — Ambulatory Visit

## 2024-05-17 ENCOUNTER — Ambulatory Visit: Admitting: Podiatry

## 2024-06-06 ENCOUNTER — Ambulatory Visit: Admitting: Family Medicine

## 2024-06-12 ENCOUNTER — Encounter: Admitting: Physical Therapy

## 2024-06-14 ENCOUNTER — Ambulatory Visit: Admitting: Obstetrics

## 2024-06-19 ENCOUNTER — Ambulatory Visit: Admitting: Physical Therapy

## 2024-06-26 ENCOUNTER — Encounter: Admitting: Physical Therapy

## 2024-07-03 ENCOUNTER — Encounter: Admitting: Physical Therapy
